# Patient Record
Sex: Female | Born: 1985
Health system: Southern US, Community
[De-identification: ages and names within clinical notes are randomized; demographics above are authoritative.]

## PROBLEM LIST (undated history)

## (undated) ENCOUNTER — Inpatient Hospital Stay (HOSPITAL_COMMUNITY): Payer: Self-pay

## (undated) ENCOUNTER — Inpatient Hospital Stay (HOSPITAL_COMMUNITY): Payer: Medicaid Other

## (undated) DIAGNOSIS — I499 Cardiac arrhythmia, unspecified: Secondary | ICD-10-CM

## (undated) DIAGNOSIS — R51 Headache: Secondary | ICD-10-CM

## (undated) DIAGNOSIS — O3410 Maternal care for benign tumor of corpus uteri, unspecified trimester: Secondary | ICD-10-CM

## (undated) DIAGNOSIS — K649 Unspecified hemorrhoids: Secondary | ICD-10-CM

## (undated) DIAGNOSIS — A549 Gonococcal infection, unspecified: Secondary | ICD-10-CM

## (undated) DIAGNOSIS — Z8669 Personal history of other diseases of the nervous system and sense organs: Secondary | ICD-10-CM

## (undated) DIAGNOSIS — D259 Leiomyoma of uterus, unspecified: Secondary | ICD-10-CM

## (undated) DIAGNOSIS — D649 Anemia, unspecified: Secondary | ICD-10-CM

## (undated) DIAGNOSIS — A749 Chlamydial infection, unspecified: Secondary | ICD-10-CM

## (undated) HISTORY — DX: Unspecified hemorrhoids: K64.9

## (undated) HISTORY — DX: Anemia, unspecified: D64.9

## (undated) HISTORY — DX: Leiomyoma of uterus, unspecified: D25.9

## (undated) HISTORY — DX: Leiomyoma of uterus, unspecified: O34.10

## (undated) HISTORY — PX: WISDOM TOOTH EXTRACTION: SHX21

---
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
## 2007-02-16 ENCOUNTER — Ambulatory Visit: Payer: Self-pay | Admitting: Internal Medicine

## 2007-02-17 ENCOUNTER — Encounter: Payer: Self-pay | Admitting: Nurse Practitioner

## 2007-02-18 ENCOUNTER — Ambulatory Visit: Payer: Self-pay | Admitting: Internal Medicine

## 2007-02-18 DIAGNOSIS — R51 Headache: Secondary | ICD-10-CM

## 2007-02-18 DIAGNOSIS — R519 Headache, unspecified: Secondary | ICD-10-CM | POA: Insufficient documentation

## 2007-09-24 ENCOUNTER — Ambulatory Visit (HOSPITAL_COMMUNITY): Admission: RE | Admit: 2007-09-24 | Discharge: 2007-09-24 | Payer: Self-pay | Admitting: Family Medicine

## 2007-12-03 ENCOUNTER — Ambulatory Visit (HOSPITAL_COMMUNITY): Admission: RE | Admit: 2007-12-03 | Discharge: 2007-12-03 | Payer: Self-pay | Admitting: Obstetrics & Gynecology

## 2008-01-03 ENCOUNTER — Emergency Department (HOSPITAL_COMMUNITY): Admission: EM | Admit: 2008-01-03 | Discharge: 2008-01-03 | Payer: Self-pay | Admitting: Family Medicine

## 2008-01-11 ENCOUNTER — Ambulatory Visit: Payer: Self-pay | Admitting: Obstetrics and Gynecology

## 2008-01-11 ENCOUNTER — Inpatient Hospital Stay (HOSPITAL_COMMUNITY): Admission: AD | Admit: 2008-01-11 | Discharge: 2008-01-11 | Payer: Self-pay | Admitting: Family Medicine

## 2008-01-13 ENCOUNTER — Inpatient Hospital Stay (HOSPITAL_COMMUNITY): Admission: RE | Admit: 2008-01-13 | Discharge: 2008-01-13 | Payer: Self-pay | Admitting: Gynecology

## 2008-01-13 ENCOUNTER — Ambulatory Visit: Payer: Self-pay | Admitting: Family Medicine

## 2008-01-14 ENCOUNTER — Inpatient Hospital Stay (HOSPITAL_COMMUNITY): Admission: AD | Admit: 2008-01-14 | Discharge: 2008-01-16 | Payer: Self-pay | Admitting: Obstetrics & Gynecology

## 2008-01-14 ENCOUNTER — Ambulatory Visit: Payer: Self-pay | Admitting: Obstetrics and Gynecology

## 2009-02-11 ENCOUNTER — Emergency Department (HOSPITAL_COMMUNITY): Admission: EM | Admit: 2009-02-11 | Discharge: 2009-02-11 | Payer: Self-pay | Admitting: Emergency Medicine

## 2009-05-29 ENCOUNTER — Emergency Department (HOSPITAL_COMMUNITY): Admission: EM | Admit: 2009-05-29 | Discharge: 2009-05-29 | Payer: Self-pay | Admitting: Family Medicine

## 2009-08-28 ENCOUNTER — Emergency Department (HOSPITAL_COMMUNITY): Admission: EM | Admit: 2009-08-28 | Discharge: 2009-08-28 | Payer: Self-pay | Admitting: Family Medicine

## 2009-09-13 ENCOUNTER — Emergency Department (HOSPITAL_COMMUNITY): Admission: EM | Admit: 2009-09-13 | Discharge: 2009-09-13 | Payer: Self-pay | Admitting: Emergency Medicine

## 2009-09-20 ENCOUNTER — Emergency Department (HOSPITAL_COMMUNITY): Admission: EM | Admit: 2009-09-20 | Discharge: 2009-09-20 | Payer: Self-pay | Admitting: Family Medicine

## 2009-09-21 ENCOUNTER — Encounter: Admission: RE | Admit: 2009-09-21 | Discharge: 2009-09-21 | Payer: Self-pay | Admitting: Family Medicine

## 2009-12-28 ENCOUNTER — Emergency Department (HOSPITAL_COMMUNITY): Admission: EM | Admit: 2009-12-28 | Discharge: 2009-12-28 | Payer: Self-pay | Admitting: Family Medicine

## 2010-02-16 ENCOUNTER — Ambulatory Visit (HOSPITAL_COMMUNITY): Admission: RE | Admit: 2010-02-16 | Discharge: 2010-02-16 | Payer: Self-pay | Admitting: Family Medicine

## 2010-03-23 ENCOUNTER — Inpatient Hospital Stay (HOSPITAL_COMMUNITY)
Admission: AD | Admit: 2010-03-23 | Discharge: 2010-03-23 | Payer: Self-pay | Source: Home / Self Care | Admitting: Obstetrics & Gynecology

## 2010-03-23 ENCOUNTER — Ambulatory Visit: Payer: Self-pay | Admitting: Obstetrics & Gynecology

## 2010-04-13 ENCOUNTER — Ambulatory Visit (HOSPITAL_COMMUNITY): Admission: RE | Admit: 2010-04-13 | Discharge: 2010-04-13 | Payer: Self-pay | Admitting: Obstetrics & Gynecology

## 2010-06-12 ENCOUNTER — Inpatient Hospital Stay (HOSPITAL_COMMUNITY)
Admission: AD | Admit: 2010-06-12 | Discharge: 2010-06-12 | Payer: Self-pay | Source: Home / Self Care | Attending: Obstetrics and Gynecology | Admitting: Obstetrics and Gynecology

## 2010-07-22 ENCOUNTER — Encounter: Payer: Self-pay | Admitting: Family Medicine

## 2010-07-23 ENCOUNTER — Inpatient Hospital Stay (HOSPITAL_COMMUNITY)
Admission: AD | Admit: 2010-07-23 | Discharge: 2010-07-23 | Payer: Self-pay | Source: Home / Self Care | Attending: Family Medicine | Admitting: Family Medicine

## 2010-07-24 LAB — URINALYSIS, ROUTINE W REFLEX MICROSCOPIC
Bilirubin Urine: NEGATIVE
Hgb urine dipstick: NEGATIVE
Ketones, ur: 15 mg/dL — AB
Urine Glucose, Fasting: NEGATIVE mg/dL
pH: 7 (ref 5.0–8.0)

## 2010-08-06 ENCOUNTER — Inpatient Hospital Stay (HOSPITAL_COMMUNITY)
Admission: AD | Admit: 2010-08-06 | Discharge: 2010-08-06 | Disposition: A | Discharge status: Home / Self Care | Payer: Medicaid Other | Source: Ambulatory Visit | Attending: Obstetrics & Gynecology | Admitting: Obstetrics & Gynecology

## 2010-08-06 DIAGNOSIS — O99891 Other specified diseases and conditions complicating pregnancy: Secondary | ICD-10-CM | POA: Insufficient documentation

## 2010-08-06 DIAGNOSIS — R5381 Other malaise: Secondary | ICD-10-CM | POA: Insufficient documentation

## 2010-08-06 LAB — URINE MICROSCOPIC-ADD ON

## 2010-08-06 LAB — URINALYSIS, ROUTINE W REFLEX MICROSCOPIC
Specific Gravity, Urine: >1.03 — ABNORMAL HIGH (ref 1.005–1.030)
Urine Glucose, Fasting: NEGATIVE mg/dL
pH: 6 (ref 5.0–8.0)

## 2010-08-17 ENCOUNTER — Inpatient Hospital Stay (HOSPITAL_COMMUNITY)
Admission: AD | Admit: 2010-08-17 | Discharge: 2010-08-17 | Disposition: A | Discharge status: Home / Self Care | Payer: Medicaid Other | Source: Ambulatory Visit | Attending: Obstetrics & Gynecology | Admitting: Obstetrics & Gynecology

## 2010-08-17 DIAGNOSIS — O99891 Other specified diseases and conditions complicating pregnancy: Secondary | ICD-10-CM | POA: Insufficient documentation

## 2010-08-17 DIAGNOSIS — O9989 Other specified diseases and conditions complicating pregnancy, childbirth and the puerperium: Secondary | ICD-10-CM

## 2010-08-17 DIAGNOSIS — R109 Unspecified abdominal pain: Secondary | ICD-10-CM

## 2010-08-17 DIAGNOSIS — K5289 Other specified noninfective gastroenteritis and colitis: Secondary | ICD-10-CM | POA: Insufficient documentation

## 2010-08-17 LAB — URINALYSIS, ROUTINE W REFLEX MICROSCOPIC
Bilirubin Urine: NEGATIVE
Hgb urine dipstick: NEGATIVE
Ketones, ur: NEGATIVE mg/dL
Nitrite: NEGATIVE
Urobilinogen, UA: 0.2 mg/dL (ref 0.0–1.0)

## 2010-08-17 LAB — URINE MICROSCOPIC-ADD ON

## 2010-08-28 ENCOUNTER — Inpatient Hospital Stay (HOSPITAL_COMMUNITY)
Admission: AD | Admit: 2010-08-28 | Discharge: 2010-08-30 | DRG: 775 | Disposition: A | Discharge status: Home / Self Care | Payer: Medicaid Other | Source: Ambulatory Visit | Attending: Obstetrics & Gynecology | Admitting: Obstetrics & Gynecology

## 2010-08-28 DIAGNOSIS — Z22330 Carrier of Group B streptococcus: Secondary | ICD-10-CM

## 2010-08-28 DIAGNOSIS — O99892 Other specified diseases and conditions complicating childbirth: Principal | ICD-10-CM | POA: Diagnosis present

## 2010-08-28 DIAGNOSIS — O9989 Other specified diseases and conditions complicating pregnancy, childbirth and the puerperium: Secondary | ICD-10-CM

## 2010-08-28 LAB — CBC
Hemoglobin: 10.7 g/dL — ABNORMAL LOW (ref 12.0–15.0)
MCH: 28.1 pg (ref 26.0–34.0)
MCV: 85.8 fL (ref 78.0–100.0)
RBC: 3.81 MIL/uL — ABNORMAL LOW (ref 3.87–5.11)
WBC: 8.4 10*3/uL (ref 4.0–10.5)

## 2010-08-28 LAB — RPR: RPR Ser Ql: NONREACTIVE

## 2010-09-11 LAB — URINALYSIS, ROUTINE W REFLEX MICROSCOPIC
Bilirubin Urine: NEGATIVE
Nitrite: NEGATIVE
Specific Gravity, Urine: 1.025 (ref 1.005–1.030)
Urobilinogen, UA: 1 mg/dL (ref 0.0–1.0)
pH: 6.5 (ref 5.0–8.0)

## 2010-09-11 LAB — FETAL FIBRONECTIN: Fetal Fibronectin: NEGATIVE

## 2010-09-13 LAB — URINALYSIS, ROUTINE W REFLEX MICROSCOPIC
Glucose, UA: NEGATIVE mg/dL
Ketones, ur: NEGATIVE mg/dL
Nitrite: NEGATIVE
Protein, ur: NEGATIVE mg/dL
Urobilinogen, UA: 0.2 mg/dL (ref 0.0–1.0)

## 2010-09-13 LAB — CBC
HCT: 29.5 % — ABNORMAL LOW (ref 36.0–46.0)
Platelets: 268 10*3/uL (ref 150–400)
RBC: 3.3 MIL/uL — ABNORMAL LOW (ref 3.87–5.11)
RDW: 13.7 % (ref 11.5–15.5)
WBC: 7.5 10*3/uL (ref 4.0–10.5)

## 2010-09-13 LAB — URINE MICROSCOPIC-ADD ON

## 2010-09-16 LAB — POCT PREGNANCY, URINE: Preg Test, Ur: POSITIVE

## 2010-09-23 LAB — POCT PREGNANCY, URINE
Preg Test, Ur: NEGATIVE
Preg Test, Ur: NEGATIVE

## 2010-09-23 LAB — POCT URINALYSIS DIP (DEVICE)
Protein, ur: NEGATIVE mg/dL
Urobilinogen, UA: 1 mg/dL (ref 0.0–1.0)

## 2010-09-23 LAB — WET PREP, GENITAL

## 2010-10-03 LAB — STREP A DNA PROBE: Group A Strep Probe: NEGATIVE

## 2010-10-03 LAB — POCT RAPID STREP A (OFFICE): Streptococcus, Group A Screen (Direct): NEGATIVE

## 2010-10-06 LAB — POCT URINALYSIS DIP (DEVICE)
Nitrite: NEGATIVE
Protein, ur: NEGATIVE mg/dL
pH: 7.5 (ref 5.0–8.0)

## 2010-11-13 NOTE — Discharge Summary (Signed)
Sonia Allen, Sonia Allen                ACCOUNT NO.:  [id number]   MEDICAL RECORD NO.:  [id number]          PATIENT TYPE:  INP   LOCATION:  9114                          FACILITY:  [facility]   PHYSICIAN:  Odie Sera, DO      DATE OF BIRTH:  1986-02-26   DATE OF ADMISSION:  01/14/2008  DATE OF DISCHARGE:  01/16/2008                               DISCHARGE SUMMARY   REASON FOR HOSPITALIZATION:  [name] is a 25 year old gravida 1, para  1-0-0-1, who presented at 40 weeks and 4/7th days in active labor.  She  was admitted to Labor and Delivery for management of labor.  Her labor  progressed normally and as her cervical dilation progressed to complete  upon beginning pushing efforts, her baby sustained a prolonged  bradycardia down to the 60s, that lasted approximately minutes.  Because  of this bradycardia, a vacuum-assisted vaginal delivery was performed,  which successfully delivered a viable female infant with a spontaneously  cry.  She had an intact perineum and a first degree left  labial/periurethral tear, which was repaired in the usual manner.  Her  postpartum course was uneventful.  She did well and was discharged from  the hospital from the hospital on postpartum day 2.   PERTINENT LABORATORY DATA:  She was GBS negative.   FINAL DIAGNOSES:  1. Status post vacuum-assisted vaginal delivery.  2. Pregnancy.   SIGNIFICANT FINDINGS:  None.   PROCEDURE PERFORMED:  Vacuum-assisted vaginal delivery, performed  secondary to fetal bradycardia.   CONDITION ON DISCHARGE:  The patient is in good condition and was  discharged to home with the usual postpartum instructions and  recommendations.  She was given instructions to continue taking her  prenatal vitamins as well as Loestrin Fe for birth control.  It was also  recommended that she is to have vaginal rest for 6 weeks and followup  with the healthcare department in 6 weeks for a postpartum check.      Odie Sera, DO  Electronically Signed     MC/MEDQ  D:  01/16/2008  T:  01/17/2008  Job:  [id number]

## 2010-11-13 NOTE — Op Note (Signed)
Sonia Allen, Sonia Allen                ACCOUNT NO.:  [id number]   MEDICAL RECORD NO.:  [id number]          PATIENT TYPE:  INP   LOCATION:  9114                          FACILITY:  [facility]   PHYSICIAN:  Ginger Carne, MD  DATE OF BIRTH:  1986-06-27   DATE OF PROCEDURE:  DATE OF DISCHARGE:                               OPERATIVE REPORT   PREOPERATIVE DIAGNOSES:  1. Term intrauterine pregnancy.  2. Fetal bradycardia.   POSTOPERATIVE DIAGNOSES:  1. Term intrauterine pregnancy.  2. Fetal bradycardia.   PROCEDURE:  Vaginal-assisted vacuum delivery.   SURGEON:  Odie Sera, DO   ASSISTANT:  Ginger Carne, MD   DESCRIPTION OF FINDINGS:  1. Viable female infant.  2. Placenta, intact   ESTIMATED BLOOD LOSS:  300 mL.   DESCRIPTION OF PROCEDURE:  The patient progressed to complete-complete,  and on beginning of pushing efforts, the baby sustained a bradycardia  into the 60s that persisted approximately 12 minutes in duration.  Due  to the persistent bradycardia, we decided to perform a vacuum-assisted  vaginal delivery.  Adequate anesthesia was confirmed.  Anesthesia was  via epidural.  The vacuum was placed in appropriate position and  confirmed that no maternal tissue was involved.  The vacuum was placed  at appropriate pressure, and with the mother's pushing efforts, the  vacuum was used to assist in delivery over 5 series of pushes.  There  were 2 pop-offs.  The head was delivered occiput anterior and baby was  bulb suctioned on the perineum.  The anterior shoulder and the rest of  the body was delivered in the usual manner.  There was no nuchal cord.  The placenta delivered spontaneously.  There was a three-vessel-cord.  The placenta was intact.  The perineum was intact.  There was a left  labial/periurethral tear, which was repaired with 3-0Vicryl suture.  The  female baby's Apgars were 6 and 8, and the weight was 6 pounds and 2  ounces.  Mother and baby are in good condition  upon completion of this  procedure.   SPECIMENS REMOVED:  1. Viable female infant.  2. Placenta.      Odie Sera, DO  Electronically Signed     ______________________________  Ginger Carne, MD    MC/MEDQ  D:  01/14/2008  T:  01/15/2008  Job:  [id number]

## 2011-03-29 LAB — CBC
Hemoglobin: 12.6
MCHC: 33.1
RDW: 15.1

## 2011-04-18 ENCOUNTER — Emergency Department (HOSPITAL_BASED_OUTPATIENT_CLINIC_OR_DEPARTMENT_OTHER)
Admission: EM | Admit: 2011-04-18 | Discharge: 2011-04-18 | Disposition: A | Discharge status: Home / Self Care | Payer: Worker's Compensation | Attending: Emergency Medicine | Admitting: Emergency Medicine

## 2011-04-18 ENCOUNTER — Encounter: Payer: Self-pay | Admitting: Family Medicine

## 2011-04-18 DIAGNOSIS — W268XXA Contact with other sharp object(s), not elsewhere classified, initial encounter: Secondary | ICD-10-CM | POA: Insufficient documentation

## 2011-04-18 DIAGNOSIS — S61209A Unspecified open wound of unspecified finger without damage to nail, initial encounter: Secondary | ICD-10-CM | POA: Insufficient documentation

## 2011-04-18 DIAGNOSIS — IMO0002 Reserved for concepts with insufficient information to code with codable children: Secondary | ICD-10-CM

## 2011-04-18 DIAGNOSIS — Y9289 Other specified places as the place of occurrence of the external cause: Secondary | ICD-10-CM | POA: Insufficient documentation

## 2011-04-18 NOTE — ED Notes (Signed)
Pt sts she cut her left thumb on a cardboard box at work. Pt has band aid to thumb, no signs of bleeding at present.

## 2011-04-18 NOTE — ED Provider Notes (Signed)
History     CSN: [id number] Arrival date & time: 04/18/2011  5:34 PM   First MD Initiated Contact with Patient 04/18/11 1746      Chief Complaint  Patient presents with  . Laceration    (Consider location/radiation/quality/duration/timing/severity/associated sxs/prior treatment) HPI Patient cut her left thumb on a cardboard box while at work. Patient applied a Band-Aid. There has been no bleeding. Patient was sent from her job to make sure she did not need any stitches. The patient denies any numbness or weakness. The pain increases slightly with palpation. It is sharp in nature. History reviewed. No pertinent past medical history.  History reviewed. No pertinent past surgical history.  No family history on file.  History  Substance Use Topics  . Smoking status: Never Smoker   . Smokeless tobacco: Not on file  . Alcohol Use: No    OB History    Grav Para Term Preterm Abortions TAB SAB Ect Mult Living                  Review of Systems  Constitutional: Negative for fever.  Respiratory: Negative for cough.   Neurological: Negative for numbness.    Allergies  Review of patient's allergies indicates no known allergies.  Home Medications   Current Outpatient Rx  Name Route Sig Dispense Refill  . IBUPROFEN 200 MG PO TABS Oral Take 200 mg by mouth every 6 (six) hours as needed. For pain       BP 114/64  Pulse 70  Temp(Src) 97.8 F (36.6 C) (Oral)  Resp 16  Ht 5\' 4"  (1.626 m)  Wt 150 lb (68.04 kg)  BMI 25.75 kg/m2  SpO2 100%  LMP 04/16/2011  Physical Exam  Nursing note and vitals reviewed. Constitutional: She appears well-developed and well-nourished. No distress.  HENT:  Head: Normocephalic and atraumatic.  Right Ear: External ear normal.  Left Ear: External ear normal.  Eyes: Conjunctivae are normal. Right eye exhibits no discharge. Left eye exhibits no discharge. No scleral icterus.  Neck: Neck supple. No tracheal deviation present.  Pulmonary/Chest:  Effort normal. No stridor. No respiratory distress.  Musculoskeletal: She exhibits no edema.       Small approximately 3-4 mm superficial laceration of the pad of her left thumb, does not penetrate through the dermis, well approximated, distal neurovascular intact  Neurological: She is alert. Cranial nerve deficit: no gross deficits.  Skin: Skin is warm and dry. No rash noted.  Psychiatric: She has a normal mood and affect.    ED Course  Procedures (including critical care time) And was irrigated and a Band-Aid was applied by nursing staff Labs Reviewed - No data to display No results found.   Diagnosis 1:  laceration    MDM  The patient is a minor laceration. A Band-Aid was applied by nursing staff       Celene Kras, MD 04/18/11 [phone]

## 2011-05-26 ENCOUNTER — Emergency Department (HOSPITAL_COMMUNITY)
Admission: EM | Admit: 2011-05-26 | Discharge: 2011-05-27 | Disposition: A | Discharge status: Home / Self Care | Payer: Self-pay | Attending: Emergency Medicine | Admitting: Emergency Medicine

## 2011-05-26 DIAGNOSIS — D259 Leiomyoma of uterus, unspecified: Secondary | ICD-10-CM | POA: Insufficient documentation

## 2011-05-26 DIAGNOSIS — R109 Unspecified abdominal pain: Secondary | ICD-10-CM | POA: Insufficient documentation

## 2011-05-27 ENCOUNTER — Emergency Department (HOSPITAL_COMMUNITY): Payer: Self-pay

## 2011-05-27 ENCOUNTER — Other Ambulatory Visit: Payer: Self-pay

## 2011-05-27 ENCOUNTER — Encounter (HOSPITAL_COMMUNITY): Payer: Self-pay | Admitting: *Deleted

## 2011-05-27 LAB — CBC
Hemoglobin: 10.2 g/dL — ABNORMAL LOW (ref 12.0–15.0)
MCH: 28.3 pg (ref 26.0–34.0)
RBC: 3.6 MIL/uL — ABNORMAL LOW (ref 3.87–5.11)

## 2011-05-27 LAB — BASIC METABOLIC PANEL
BUN: 16 mg/dL (ref 6–23)
CO2: 27 mEq/L (ref 19–32)
Chloride: 104 mEq/L (ref 96–112)
GFR calc Af Amer: >90 mL/min (ref >90)
GFR calc non Af Amer: >90 mL/min (ref >90)
Glucose, Bld: 99 mg/dL (ref 70–99)
Potassium: 5.6 mEq/L — ABNORMAL HIGH (ref 3.5–5.1)
Potassium: 3.6 mEq/L (ref 3.5–5.1)
Sodium: 139 mEq/L (ref 135–145)

## 2011-05-27 LAB — DIFFERENTIAL
Eosinophils Absolute: 0.1 10*3/uL (ref 0.0–0.7)
Lymphs Abs: 2.9 10*3/uL (ref 0.7–4.0)
Monocytes Relative: 5 % (ref 3–12)
Neutro Abs: 4.2 10*3/uL (ref 1.7–7.7)
Neutrophils Relative %: 55 % (ref 43–77)

## 2011-05-27 LAB — URINALYSIS, ROUTINE W REFLEX MICROSCOPIC
Bilirubin Urine: NEGATIVE
Leukocytes, UA: NEGATIVE
Nitrite: NEGATIVE
Specific Gravity, Urine: 1.028 (ref 1.005–1.030)
pH: 6.5 (ref 5.0–8.0)

## 2011-05-27 LAB — WET PREP, GENITAL

## 2011-05-27 MED ORDER — MORPHINE SULFATE 4 MG/ML IJ SOLN
4.0000 mg | Freq: Once | INTRAMUSCULAR | Status: AC
  Administered 2011-05-27: 4 mg via INTRAVENOUS
  Filled 2011-05-27: qty 1

## 2011-05-27 MED ORDER — HYDROCODONE-ACETAMINOPHEN 5-325 MG PO TABS: 1.0000 | ORAL_TABLET | ORAL | Status: AC | PRN

## 2011-05-27 MED ORDER — ONDANSETRON HCL 4 MG/2ML IJ SOLN
4.0000 mg | Freq: Once | INTRAMUSCULAR | Status: AC
  Administered 2011-05-27: 4 mg via INTRAVENOUS
  Filled 2011-05-27: qty 2

## 2011-05-27 NOTE — ED Notes (Signed)
Patient stable upon discharge. Patient discharged home with friend.  

## 2011-05-27 NOTE — ED Notes (Signed)
Pt states that her RLQ began to hurt today when she woke up.  Pt states that her pain radiates to her back.  Pt denies hx of same.  Pt denies dysuria or hematuria or abnormal vaginal discharge.  Pt also expresses pain with walking

## 2011-05-27 NOTE — ED Provider Notes (Signed)
History     CSN: [id number] Arrival date & time: 05/26/2011 11:44 PM   First MD Initiated Contact with Patient 05/27/11 0050      Chief Complaint  Patient presents with  . Abdominal Pain    HPI  History provided by the patient. Patient presents with complaints of right lower abdominal pain that began earlier yesterday afternoon around 11 AM. Patient reports coming off the second shift is asleep at the time. Patient continued to go back to sleep and states that she was ignoring the pain but the pain continued to increase throughout the afternoon. Pain is sharp and radiates some to the right flank. Patient reports taking Tylenol without any improvement of pain. Pain is worse with certain positions and when she laid on her abdomen. Patient denies any fever, chills, sweats. She denies dysuria, urinary frequency, hematuria, diarrhea, constipation, vaginal discharge, vaginal bleeding or vaginal pain. Patient has had some nausea but denies any vomiting. Patient has no similar symptoms previously. Patient has no significant past medical history.  History reviewed. No pertinent past medical history.  History reviewed. No pertinent past surgical history.  History reviewed. No pertinent family history.  History  Substance Use Topics  . Smoking status: Never Smoker   . Smokeless tobacco: Not on file  . Alcohol Use: No    OB History    Grav Para Term Preterm Abortions TAB SAB Ect Mult Living   2 2 2  0 0 0 0 0 0 2      Review of Systems  Constitutional: Negative for fever, chills and diaphoresis.  Respiratory: Negative for cough and shortness of breath.   Cardiovascular: Negative for chest pain.  Gastrointestinal: Positive for nausea and abdominal pain. Negative for vomiting, diarrhea, constipation and rectal pain.  Genitourinary: Negative for dysuria, frequency, hematuria, flank pain, vaginal bleeding, vaginal discharge and vaginal pain.  All other systems reviewed and are  negative.    Allergies  Review of patient's allergies indicates no known allergies.  Home Medications   Current Outpatient Rx  Name Route Sig Dispense Refill  . IBUPROFEN 200 MG PO TABS Oral Take 200 mg by mouth every 6 (six) hours as needed. For pain       BP 96/78  Pulse 71  Temp(Src) 98.8 F (37.1 C) (Oral)  Resp 19  SpO2 100%  Physical Exam  Nursing note and vitals reviewed. Constitutional: She is oriented to person, place, and time. She appears well-developed and well-nourished. No distress.  HENT:  Head: Normocephalic.  Cardiovascular: Normal rate and regular rhythm.   No murmur heard. Pulmonary/Chest: Effort normal and breath sounds normal. She has no wheezes. She has no rales.  Abdominal: Soft. There is no hepatosplenomegaly. There is tenderness in the right lower quadrant. There is no rigidity, no guarding, no CVA tenderness and negative Murphy's sign.  Genitourinary: Vaginal discharge found.       Chaperone was present. Patient states that right adnexal tenderness is similar to the pain she has felt all day long. Patient has no significant cervical motion tenderness. There is a small amount of thick white discharge present.  Neurological: She is alert and oriented to person, place, and time.  Skin: Skin is warm. No rash noted.  Psychiatric: She has a normal mood and affect. Her behavior is normal.    ED Course  Procedures (including critical care time)  Labs Reviewed  WET PREP, GENITAL - Abnormal; Notable for the following:    Clue Cells, Wet Prep MODERATE (*)  WBC, Wet Prep HPF POC FEW (*)    All other components within normal limits  BASIC METABOLIC PANEL - Abnormal; Notable for the following:    Potassium 5.6 (*) MODERATE HEMOLYSIS   All other components within normal limits  URINALYSIS, ROUTINE W REFLEX MICROSCOPIC  POCT PREGNANCY, URINE  GC/CHLAMYDIA PROBE AMP, GENITAL  POCT PREGNANCY, URINE  BASIC METABOLIC PANEL  CBC  DIFFERENTIAL    Results  for orders placed during the hospital encounter of 05/26/11  WET PREP, GENITAL      Component Value Range   Yeast, Wet Prep NONE SEEN  NONE SEEN    Trich, Wet Prep NONE SEEN  NONE SEEN    Clue Cells, Wet Prep MODERATE (*) NONE SEEN    WBC, Wet Prep HPF POC FEW (*) NONE SEEN   BASIC METABOLIC PANEL      Component Value Range   Sodium 136  135 - 145 (mEq/L)   Potassium 5.6 (*) 3.5 - 5.1 (mEq/L)   Chloride 104  96 - 112 (mEq/L)   CO2 24  19 - 32 (mEq/L)   Glucose, Bld 91  70 - 99 (mg/dL)   BUN 16  6 - 23 (mg/dL)   Creatinine, Ser 8.29  0.50 - 1.10 (mg/dL)   Calcium 8.7  8.4 - 56.2 (mg/dL)   GFR calc non Af Amer >90  >90 (mL/min)   GFR calc Af Amer >90  >90 (mL/min)  URINALYSIS, ROUTINE W REFLEX MICROSCOPIC      Component Value Range   Color, Urine YELLOW  YELLOW    Appearance CLEAR  CLEAR    Specific Gravity, Urine 1.028  1.005 - 1.030    pH 6.5  5.0 - 8.0    Glucose, UA NEGATIVE  NEGATIVE (mg/dL)   Hgb urine dipstick NEGATIVE  NEGATIVE    Bilirubin Urine NEGATIVE  NEGATIVE    Ketones, ur NEGATIVE  NEGATIVE (mg/dL)   Protein, ur NEGATIVE  NEGATIVE (mg/dL)   Urobilinogen, UA 1.0  0.0 - 1.0 (mg/dL)   Nitrite NEGATIVE  NEGATIVE    Leukocytes, UA NEGATIVE  NEGATIVE   POCT PREGNANCY, URINE      Component Value Range   Preg Test, Ur NEGATIVE       US Transvaginal Non-ob  05/27/2011  *RADIOLOGY REPORT*  Clinical Data:  Right pelvic pain for 1 day.  TRANSABDOMINAL AND TRANSVAGINAL ULTRASOUND OF PELVIS DOPPLER ULTRASOUND OF OVARIES  Technique:  Both transabdominal and transvaginal ultrasound examinations of the pelvis were performed. Transabdominal technique was performed for global imaging of the pelvis including uterus, ovaries, adnexal regions, and pelvic cul-de-sac.  It was necessary to proceed with endovaginal exam following the transabdominal exam to visualize the ovaries in greater detail.  Color and duplex Doppler ultrasound was utilized to evaluate blood flow to the ovaries.   Comparison:  Prior ultrasound of pregnancy performed 04/13/2010  Findings:  Uterus:  Enlarged, reflecting a large anterior fibroid; measures 11.0 x 5.9 x 6.4 cm. There is a 4.7 x 4.4 x 4.3 cm fibroid noted along the right anterior aspect of the uterus, demonstrating a calcified rim.  Endometrium:  Normal in thickness and appearance; measures 0.9 cm in thickness.  Right ovary: Normal appearance/no adnexal mass; measures 4.3 x 2.0 x 2.3 cm.  Left ovary:   Normal appearance/no adnexal mass; measures 3.5 x 2.0 x 1.3 cm.  Pulsed Doppler evaluation demonstrates normal low-resistance arterial and venous waveforms in both ovaries.  A small amount of free fluid is seen within  the pelvic cul-de-sac, likely physiologic in nature.  IMPRESSION:  1.  No sonographic evidence for ovarian torsion. 2.  4.7 cm peripherally calcified fibroid noted along the right anterior aspect of the uterus.  Uterus otherwise unremarkable in appearance.  Original Report Authenticated By: Tonia Ghent, M.D.   US Pelvis Complete  05/27/2011  *RADIOLOGY REPORT*  Clinical Data:  Right pelvic pain for 1 day.  TRANSABDOMINAL AND TRANSVAGINAL ULTRASOUND OF PELVIS DOPPLER ULTRASOUND OF OVARIES  Technique:  Both transabdominal and transvaginal ultrasound examinations of the pelvis were performed. Transabdominal technique was performed for global imaging of the pelvis including uterus, ovaries, adnexal regions, and pelvic cul-de-sac.  It was necessary to proceed with endovaginal exam following the transabdominal exam to visualize the ovaries in greater detail.  Color and duplex Doppler ultrasound was utilized to evaluate blood flow to the ovaries.  Comparison:  Prior ultrasound of pregnancy performed 04/13/2010  Findings:  Uterus:  Enlarged, reflecting a large anterior fibroid; measures 11.0 x 5.9 x 6.4 cm. There is a 4.7 x 4.4 x 4.3 cm fibroid noted along the right anterior aspect of the uterus, demonstrating a calcified rim.  Endometrium:  Normal in  thickness and appearance; measures 0.9 cm in thickness.  Right ovary: Normal appearance/no adnexal mass; measures 4.3 x 2.0 x 2.3 cm.  Left ovary:   Normal appearance/no adnexal mass; measures 3.5 x 2.0 x 1.3 cm.  Pulsed Doppler evaluation demonstrates normal low-resistance arterial and venous waveforms in both ovaries.  A small amount of free fluid is seen within the pelvic cul-de-sac, likely physiologic in nature.  IMPRESSION:  1.  No sonographic evidence for ovarian torsion. 2.  4.7 cm peripherally calcified fibroid noted along the right anterior aspect of the uterus.  Uterus otherwise unremarkable in appearance.  Original Report Authenticated By: Tonia Ghent, M.D.   [place] Art/ven Flow Abd Pelv Doppler  05/27/2011  *RADIOLOGY REPORT*  Clinical Data:  Right pelvic pain for 1 day.  TRANSABDOMINAL AND TRANSVAGINAL ULTRASOUND OF PELVIS DOPPLER ULTRASOUND OF OVARIES  Technique:  Both transabdominal and transvaginal ultrasound examinations of the pelvis were performed. Transabdominal technique was performed for global imaging of the pelvis including uterus, ovaries, adnexal regions, and pelvic cul-de-sac.  It was necessary to proceed with endovaginal exam following the transabdominal exam to visualize the ovaries in greater detail.  Color and duplex Doppler ultrasound was utilized to evaluate blood flow to the ovaries.  Comparison:  Prior ultrasound of pregnancy performed 04/13/2010  Findings:  Uterus:  Enlarged, reflecting a large anterior fibroid; measures 11.0 x 5.9 x 6.4 cm. There is a 4.7 x 4.4 x 4.3 cm fibroid noted along the right anterior aspect of the uterus, demonstrating a calcified rim.  Endometrium:  Normal in thickness and appearance; measures 0.9 cm in thickness.  Right ovary: Normal appearance/no adnexal mass; measures 4.3 x 2.0 x 2.3 cm.  Left ovary:   Normal appearance/no adnexal mass; measures 3.5 x 2.0 x 1.3 cm.  Pulsed Doppler evaluation demonstrates normal low-resistance arterial and venous  waveforms in both ovaries.  A small amount of free fluid is seen within the pelvic cul-de-sac, likely physiologic in nature.  IMPRESSION:  1.  No sonographic evidence for ovarian torsion. 2.  4.7 cm peripherally calcified fibroid noted along the right anterior aspect of the uterus.  Uterus otherwise unremarkable in appearance.  Original Report Authenticated By: Tonia Ghent, M.D.     No diagnosis found.    MDM  1:00 AM patient seen and evaluated. Patient in no  acute distress.  3:00AM vision feeling much better after pain medication. She is resting comfortably at this time. Abdomen continues to be soft with no peritoneal signs. WBC is normal.   6 clock a.m. pelvic ultrasound demonstrates fibroid of the uterus. There is no signs for ovarian torsion or cyst. Patient continues to have soft abdomen and is pain-free, at this time no clinical concern for acute abdomen. Will have patient return home.   Angus Seller, [place] 05/27/11 [phone]

## 2011-05-27 NOTE — ED Notes (Signed)
[id number]<GN> Expected date:<BR> Expected time:<BR> Means of arrival:<BR> Comments:<BR> Closed

## 2011-05-27 NOTE — ED Provider Notes (Signed)
Medical screening examination/treatment/procedure(s) were performed by non-physician practitioner and as supervising physician I was immediately available for consultation/collaboration.  Nicholes Stairs, MD 05/27/11 2256

## 2011-05-28 LAB — GC/CHLAMYDIA PROBE AMP, GENITAL: GC Probe Amp, Genital: NEGATIVE

## 2011-08-11 ENCOUNTER — Inpatient Hospital Stay (HOSPITAL_COMMUNITY)
Admission: AD | Admit: 2011-08-11 | Discharge: 2011-08-11 | Disposition: A | Discharge status: Home / Self Care | Payer: Self-pay | Source: Ambulatory Visit | Attending: Obstetrics and Gynecology | Admitting: Obstetrics and Gynecology

## 2011-08-11 ENCOUNTER — Inpatient Hospital Stay (HOSPITAL_COMMUNITY): Payer: Self-pay

## 2011-08-11 ENCOUNTER — Encounter (HOSPITAL_COMMUNITY): Payer: Self-pay | Admitting: *Deleted

## 2011-08-11 DIAGNOSIS — R109 Unspecified abdominal pain: Secondary | ICD-10-CM | POA: Insufficient documentation

## 2011-08-11 DIAGNOSIS — B9689 Other specified bacterial agents as the cause of diseases classified elsewhere: Secondary | ICD-10-CM | POA: Insufficient documentation

## 2011-08-11 DIAGNOSIS — Z331 Pregnant state, incidental: Secondary | ICD-10-CM

## 2011-08-11 DIAGNOSIS — N760 Acute vaginitis: Secondary | ICD-10-CM | POA: Insufficient documentation

## 2011-08-11 DIAGNOSIS — A499 Bacterial infection, unspecified: Secondary | ICD-10-CM | POA: Insufficient documentation

## 2011-08-11 DIAGNOSIS — O2390 Unspecified genitourinary tract infection in pregnancy, unspecified trimester: Secondary | ICD-10-CM | POA: Insufficient documentation

## 2011-08-11 HISTORY — DX: Cardiac arrhythmia, unspecified: I49.9

## 2011-08-11 HISTORY — DX: Gonococcal infection, unspecified: A54.9

## 2011-08-11 HISTORY — DX: Chlamydial infection, unspecified: A74.9

## 2011-08-11 LAB — CBC
MCH: 28.2 pg (ref 26.0–34.0)
MCHC: 32.9 g/dL (ref 30.0–36.0)
Platelets: 305 10*3/uL (ref 150–400)
RDW: 14.2 % (ref 11.5–15.5)

## 2011-08-11 LAB — HCG, QUANTITATIVE, PREGNANCY: hCG, Beta Chain, Quant, S: 18355 m[IU]/mL — ABNORMAL HIGH (ref <5)

## 2011-08-11 LAB — WET PREP, GENITAL
Trich, Wet Prep: NONE SEEN
Yeast Wet Prep HPF POC: NONE SEEN

## 2011-08-11 LAB — DIFFERENTIAL
Basophils Relative: 1 % (ref 0–1)
Eosinophils Absolute: 0.1 10*3/uL (ref 0.0–0.7)
Neutrophils Relative %: 47 % (ref 43–77)

## 2011-08-11 LAB — ABO/RH: ABO/RH(D): B POS

## 2011-08-11 MED ORDER — METRONIDAZOLE 500 MG PO TABS: 500.0000 mg | ORAL_TABLET | Freq: Three times a day (TID) | ORAL | Status: AC

## 2011-08-11 NOTE — ED Provider Notes (Signed)
History     CSN: [id number]  Arrival date & time 08/11/11  1543   None     Chief Complaint  Patient presents with  . Dizziness    HPI Sonia Allen is a 26 y.o. female who presents to MAU for lower abdominal cramping. She had a positive home pregnancy test that was positive. She had sex Jan. 11th and took the morning after pill Jan 14th and then had unprotected sex again 3 days later. Currently has 2 sex partners and had sex with one on Jan. 11 and the other partner on Jan. 17. Having vaginal discharge.   Past Medical History  Diagnosis Date  . Gonorrhea   . Chlamydia   . Arrhythmia     Past Surgical History  Procedure Date  . Wisdom tooth extraction     History reviewed. No pertinent family history.  History  Substance Use Topics  . Smoking status: Never Smoker   . Smokeless tobacco: Not on file  . Alcohol Use: Yes     weekly- 1/2 bottle liquor    OB History    Grav Para Term Preterm Abortions TAB SAB Ect Mult Living   3 2 2  0 0 0 0 0 0 2      Review of Systems  Constitutional: Negative for fever, chills, diaphoresis and fatigue.  HENT: Negative for ear pain, congestion, sore throat, facial swelling, neck pain, neck stiffness, dental problem and sinus pressure.   Eyes: Negative for photophobia, pain and discharge.  Respiratory: Negative for cough, chest tightness and wheezing.   Gastrointestinal: Positive for nausea and abdominal pain. Negative for vomiting, diarrhea, constipation and abdominal distention.  Genitourinary: Positive for frequency, vaginal discharge and pelvic pain. Negative for dysuria, flank pain, vaginal bleeding, difficulty urinating and vaginal pain.  Musculoskeletal: Negative for myalgias, back pain and gait problem.  Skin: Negative for color change and rash.  Neurological: Positive for light-headedness. Negative for dizziness, speech difficulty, weakness, numbness and headaches.  Psychiatric/Behavioral: Negative for confusion and agitation.     Allergies  Review of patient's allergies indicates no known allergies.  Home Medications  No current outpatient prescriptions on file.  BP 113/61  Pulse 90  Temp(Src) 99.5 F (37.5 C) (Oral)  Resp 18  Ht 5' 3.75" (1.619 m)  Wt 158 lb 9.6 oz (71.94 kg)  BMI 27.44 kg/m2  LMP 07/09/2011  Physical Exam  Nursing note and vitals reviewed. Constitutional: She is oriented to person, place, and time. She appears well-developed and well-nourished.  HENT:  Head: Normocephalic.  Eyes: EOM are normal.  Neck: Neck supple.  Cardiovascular: Normal rate.   Pulmonary/Chest: Effort normal.  Abdominal: Soft. There is no tenderness.  Genitourinary:       External genitalia without lesions. White discharge vaginal vault. Cervix closed, nabothian cyst noted. No CMT, no adnexal tenderness. Uterus slightly enlarged.  Musculoskeletal: Normal range of motion.  Neurological: She is alert and oriented to person, place, and time. No cranial nerve deficit.  Skin: Skin is warm and dry.  Psychiatric: She has a normal mood and affect. Her behavior is normal. Judgment and thought content normal.   Results for orders placed during the hospital encounter of 08/11/11 (from the past 24 hour(s))  POCT PREGNANCY, URINE     Status: Abnormal   Collection Time   08/11/11  4:12 PM      Component Value Range   Preg Test, Ur POSITIVE (*) NEGATIVE   CBC     Status: Abnormal   Collection  Time   08/11/11  6:19 PM      Component Value Range   WBC 5.1  4.0 - 10.5 (K/uL)   RBC 3.83 (*) 3.87 - 5.11 (MIL/uL)   Hemoglobin 10.8 (*) 12.0 - 15.0 (g/dL)   HCT 21.3 (*) 08.6 - 46.0 (%)   MCV 85.6  78.0 - 100.0 (fL)   MCH 28.2  26.0 - 34.0 (pg)   MCHC 32.9  30.0 - 36.0 (g/dL)   RDW 57.8  46.9 - 62.9 (%)   Platelets 305  150 - 400 (K/uL)  DIFFERENTIAL     Status: Normal   Collection Time   08/11/11  6:19 PM      Component Value Range   Neutrophils Relative 47  43 - 77 (%)   Neutro Abs 2.4  1.7 - 7.7 (K/uL)    Lymphocytes Relative 43  12 - 46 (%)   Lymphs Abs 2.2  0.7 - 4.0 (K/uL)   Monocytes Relative 7  3 - 12 (%)   Monocytes Absolute 0.3  0.1 - 1.0 (K/uL)   Eosinophils Relative 2  0 - 5 (%)   Eosinophils Absolute 0.1  0.0 - 0.7 (K/uL)   Basophils Relative 1  0 - 1 (%)   Basophils Absolute 0.0  0.0 - 0.1 (K/uL)  HCG, QUANTITATIVE, PREGNANCY     Status: Abnormal   Collection Time   08/11/11  6:19 PM      Component Value Range   hCG, Beta Chain, Quant, S 18355 (*) <5 (mIU/mL)  ABO/RH     Status: Normal   Collection Time   08/11/11  6:19 PM      Component Value Range   ABO/RH(D) B POS    WET PREP, GENITAL     Status: Abnormal   Collection Time   08/11/11  6:40 PM      Component Value Range   Yeast Wet Prep HPF POC NONE SEEN  NONE SEEN    Trich, Wet Prep NONE SEEN  NONE SEEN    Clue Cells Wet Prep HPF POC MODERATE (*) NONE SEEN    WBC, Wet Prep HPF POC MANY (*) NONE SEEN   POCT PREGNANCY, URINE     Status: Abnormal   Collection Time   08/11/11  7:23 PM      Component Value Range   Preg Test, Ur POSITIVE (*) NEGATIVE    Assessment: Abdominal pain in early pregnancy   Bacterial vaginosis  Plan:  Labs, ultrasound  ED Course: Care turned over to Wynelle Bourgeois, CNM @ 8:15 pm patient awaiting Ultrasound.  Procedures   MDM          Kerrie Buffalo, NP 08/11/11 2014  US Ob Comp Less 14 Wks  08/11/2011  *RADIOLOGY REPORT*  Clinical Data: Pelvic pain.  Pregnant.  OBSTETRIC <14 WK ULTRASOUND  Technique:  Transabdominal ultrasound was performed for evaluation of the gestation as well as the maternal uterus and adnexal regions.  Comparison:  None  Intrauterine gestational sac: Visualized/normal in shape. Yolk sac: Present Embryo: Present Cardiac Activity: Present Heart Rate: 116 bpm  MSD:  mm  w  d CRL:  3.8 mm  sixw  oned            [place] EDC: 04/04/2012.  Maternal uterus/Adnexae: A small to moderate sized subchorionic hemorrhage is noted. The right ovary is normal.  A small corpus luteum cyst  is noted. The left ovary is normal. No free pelvic fluid collections.  IMPRESSION:  1.  Single living intrauterine embryo estimated at 6 weeks and 1 day gestation. 2.  Small to moderate subchorionic hemorrhage. 3.  Normal ovaries.  Original Report Authenticated By: P. Loralie Champagne, M.D.   Informed patient of above and about BV. Will d/c home to prenatal care and give Rx for Flagyl.

## 2011-08-11 NOTE — Progress Notes (Signed)
Pt feels dizzy and lightheaded  And nauseated x 2 weeks.

## 2011-08-12 LAB — GC/CHLAMYDIA PROBE AMP, GENITAL
Chlamydia, DNA Probe: NEGATIVE
GC Probe Amp, Genital: NEGATIVE

## 2011-08-31 HISTORY — PX: INDUCED ABORTION: SHX677

## 2011-10-23 ENCOUNTER — Encounter (HOSPITAL_COMMUNITY): Payer: Self-pay | Admitting: *Deleted

## 2011-10-23 ENCOUNTER — Inpatient Hospital Stay (HOSPITAL_COMMUNITY)
Admission: AD | Admit: 2011-10-23 | Discharge: 2011-10-23 | Disposition: A | Discharge status: Home / Self Care | Payer: Self-pay | Source: Ambulatory Visit | Attending: Obstetrics and Gynecology | Admitting: Obstetrics and Gynecology

## 2011-10-23 DIAGNOSIS — K649 Unspecified hemorrhoids: Secondary | ICD-10-CM | POA: Insufficient documentation

## 2011-10-23 DIAGNOSIS — K6289 Other specified diseases of anus and rectum: Secondary | ICD-10-CM | POA: Insufficient documentation

## 2011-10-23 LAB — URINALYSIS, ROUTINE W REFLEX MICROSCOPIC
Ketones, ur: NEGATIVE mg/dL
Leukocytes, UA: NEGATIVE
Nitrite: NEGATIVE
Protein, ur: NEGATIVE mg/dL

## 2011-10-23 LAB — URINE MICROSCOPIC-ADD ON

## 2011-10-23 NOTE — MAU Provider Note (Signed)
History     CSN: [id number]  Arrival date and time: 10/23/11 1923   None     Chief Complaint  Patient presents with  . Rectal Pain   HPI 26 y.o. [id number]. Pain with BM since Friday, can feel a "lump", no bleeding. H/O hemorrhoids. Not using anything for discomfort.  LMP 4/18, had TAB on 3/2.     Past Medical History  Diagnosis Date  . Gonorrhea   . Chlamydia   . Arrhythmia     Past Surgical History  Procedure Date  . Wisdom tooth extraction     History reviewed. No pertinent family history.  History  Substance Use Topics  . Smoking status: Never Smoker   . Smokeless tobacco: Never Used  . Alcohol Use: Yes     weekly- 1/2 bottle liquor    Allergies: No Known Allergies  Prescriptions prior to admission  Medication Sig Dispense Refill  . ibuprofen (ADVIL,MOTRIN) 200 MG tablet Take 200 mg by mouth every 6 (six) hours as needed. For pain         Review of Systems  Constitutional: Negative.   Respiratory: Negative.   Cardiovascular: Negative.   Gastrointestinal: Negative for nausea, vomiting, abdominal pain, diarrhea and constipation.  Genitourinary: Negative for dysuria, urgency, frequency, hematuria and flank pain.       Negative for vaginal bleeding, vaginal discharge, dyspareunia  Musculoskeletal: Negative.   Neurological: Negative.   Psychiatric/Behavioral: Negative.    Physical Exam   Blood pressure 119/60, pulse 88, temperature 97.2 F (36.2 C), temperature source Oral, resp. rate 16, height 5' 2.5" (1.588 m), weight 161 lb 3.2 oz (73.12 kg), last menstrual period 10/17/2011, unknown if currently breastfeeding.  Physical Exam  Nursing note and vitals reviewed. Constitutional: She is oriented to person, place, and time. She appears well-developed and well-nourished. No distress.  Cardiovascular: Normal rate.   Respiratory: Effort normal.  GI: Soft. There is no tenderness.  Genitourinary: Rectal exam shows external hemorrhoid (small, not  thrombosed).  Musculoskeletal: Normal range of motion.  Lymphadenopathy:       Right: Inguinal adenopathy present.  Neurological: She is alert and oriented to person, place, and time.  Skin: Skin is warm and dry.  Psychiatric: She has a normal mood and affect.    MAU Course  Procedures Results for orders placed during the hospital encounter of 10/23/11 (from the past 24 hour(s))  URINALYSIS, ROUTINE W REFLEX MICROSCOPIC     Status: Abnormal   Collection Time   10/23/11  7:51 PM      Component Value Range   Color, Urine YELLOW  YELLOW    APPearance CLEAR  CLEAR    Specific Gravity, Urine >1.030 (*) 1.005 - 1.030    pH 5.5  5.0 - 8.0    Glucose, UA NEGATIVE  NEGATIVE (mg/dL)   Hgb urine dipstick SMALL (*) NEGATIVE    Bilirubin Urine NEGATIVE  NEGATIVE    Ketones, ur NEGATIVE  NEGATIVE (mg/dL)   Protein, ur NEGATIVE  NEGATIVE (mg/dL)   Urobilinogen, UA 1.0  0.0 - 1.0 (mg/dL)   Nitrite NEGATIVE  NEGATIVE    Leukocytes, UA NEGATIVE  NEGATIVE   URINE MICROSCOPIC-ADD ON     Status: Abnormal   Collection Time   10/23/11  7:51 PM      Component Value Range   Squamous Epithelial / LPF FEW (*) RARE    WBC, UA 0-2  <3 (WBC/hpf)   RBC / HPF 3-6  <3 (RBC/hpf)   Urine-Other MUCOUS PRESENT  POCT PREGNANCY, URINE     Status: Normal   Collection Time   10/23/11  8:11 PM      Component Value Range   Preg Test, Ur NEGATIVE  NEGATIVE      Assessment and Plan  26 y.o. [id number] with uncomplicated hemorrhoid Rev'd comfort measures, preventative measures, OTC treatments F/U PRN  Nuha Degner 10/23/2011, 8:13 PM

## 2011-10-23 NOTE — MAU Note (Signed)
Pt LMP 10/17/11.  TAB 08/31/2011.  Pt having rectal pain with BM.  Feels a "bump" between her legs that can be moved around.

## 2011-10-24 NOTE — MAU Provider Note (Signed)
Attestation of Attending Supervision of Advanced Practitioner: Evaluation and management procedures were performed by the PA/NP/CNM/OB Fellow under my supervision/collaboration. Chart reviewed and agree with management and plan.  Thaer Miyoshi V 10/24/2011 6:46 AM

## 2011-12-13 ENCOUNTER — Inpatient Hospital Stay (HOSPITAL_COMMUNITY)
Admission: AD | Admit: 2011-12-13 | Discharge: 2011-12-13 | Disposition: A | Discharge status: Home / Self Care | Payer: Self-pay | Source: Ambulatory Visit | Attending: Obstetrics & Gynecology | Admitting: Obstetrics & Gynecology

## 2011-12-13 ENCOUNTER — Encounter (HOSPITAL_COMMUNITY): Payer: Self-pay | Admitting: *Deleted

## 2011-12-13 DIAGNOSIS — N938 Other specified abnormal uterine and vaginal bleeding: Secondary | ICD-10-CM | POA: Insufficient documentation

## 2011-12-13 DIAGNOSIS — N939 Abnormal uterine and vaginal bleeding, unspecified: Secondary | ICD-10-CM

## 2011-12-13 DIAGNOSIS — B9689 Other specified bacterial agents as the cause of diseases classified elsewhere: Secondary | ICD-10-CM | POA: Insufficient documentation

## 2011-12-13 DIAGNOSIS — N949 Unspecified condition associated with female genital organs and menstrual cycle: Secondary | ICD-10-CM | POA: Insufficient documentation

## 2011-12-13 DIAGNOSIS — N760 Acute vaginitis: Secondary | ICD-10-CM | POA: Insufficient documentation

## 2011-12-13 DIAGNOSIS — A499 Bacterial infection, unspecified: Secondary | ICD-10-CM | POA: Insufficient documentation

## 2011-12-13 HISTORY — DX: Headache: R51

## 2011-12-13 HISTORY — DX: Personal history of other diseases of the nervous system and sense organs: Z86.69

## 2011-12-13 LAB — POCT PREGNANCY, URINE: Preg Test, Ur: NEGATIVE

## 2011-12-13 LAB — CBC
MCV: 85.6 fL (ref 78.0–100.0)
Platelets: 380 10*3/uL (ref 150–400)
RBC: 4.09 MIL/uL (ref 3.87–5.11)
WBC: 5.7 10*3/uL (ref 4.0–10.5)

## 2011-12-13 LAB — URINALYSIS, ROUTINE W REFLEX MICROSCOPIC
Glucose, UA: NEGATIVE mg/dL
Leukocytes, UA: NEGATIVE
Specific Gravity, Urine: 1.02 (ref 1.005–1.030)
Urobilinogen, UA: 0.2 mg/dL (ref 0.0–1.0)

## 2011-12-13 LAB — URINE MICROSCOPIC-ADD ON

## 2011-12-13 LAB — WET PREP, GENITAL
Trich, Wet Prep: NONE SEEN
Yeast Wet Prep HPF POC: NONE SEEN

## 2011-12-13 MED ORDER — METRONIDAZOLE 500 MG PO TABS: 500.0000 mg | ORAL_TABLET | Freq: Two times a day (BID) | ORAL | Status: AC

## 2011-12-13 NOTE — MAU Provider Note (Signed)
History     CSN: [id number]  Arrival date and time: 12/13/11 1741   First Provider Initiated Contact with Patient 12/13/11 2036      No chief complaint on file.  HPI  Pt is  Not pregnant with LMP 12/01/2011 lasting 10 days and has been spotting today.  She has not had cramping.  She was changing every one to 2 hours with some clots.  She is not using anything for birth control.  She goes to [facility].  She denies fever, chills or UTI symptoms.  She denies vaginal discharge.    Past Medical History  Diagnosis Date  . Gonorrhea   . Chlamydia   . Arrhythmia   . Hx of migraine headaches   . Headache     Past Surgical History  Procedure Date  . Wisdom tooth extraction   . Induced abortion March  2 ,2013    Family History  Problem Relation Age of Onset  . Other Neg Hx     History  Substance Use Topics  . Smoking status: Never Smoker   . Smokeless tobacco: Never Used  . Alcohol Use: Yes     weekly- 1/2 bottle liquor    Allergies: No Known Allergies  Prescriptions prior to admission  Medication Sig Dispense Refill  . ibuprofen (ADVIL,MOTRIN) 200 MG tablet Take 400 mg by mouth every 6 (six) hours as needed. For pain        Review of Systems  Constitutional: Negative for fever and chills.  Gastrointestinal: Negative for nausea, vomiting, abdominal pain, diarrhea and constipation.  Genitourinary: Negative for dysuria, urgency and frequency.  Neurological: Negative for headaches.   Physical Exam   Blood pressure 121/71, pulse 79, temperature 98.4 F (36.9 C), temperature source Oral, resp. rate 16, height 5' 2.5" (1.588 m), weight 164 lb 6 oz (74.56 kg), last menstrual period 12/01/2011, not currently breastfeeding.  Physical Exam  Constitutional: She is oriented to person, place, and time. She appears well-developed and well-nourished.  HENT:  Head: Normocephalic.  Eyes: Pupils are equal, round, and reactive to light.  Neck: Normal range of motion. Neck  supple.  Cardiovascular: Normal rate.   Respiratory: Effort normal.  GI: Soft. She exhibits no distension. There is no tenderness. There is no rebound and no guarding.  Genitourinary:       Mod amount of opaque pink discharge in vault; cervix clean, NT; uterus NSSC NT; adnexa without palpable enlargement or tenderness  Musculoskeletal: Normal range of motion.  Neurological: She is alert and oriented to person, place, and time.  Skin: Skin is warm and dry.  Psychiatric: She has a normal mood and affect.    MAU Course  Procedures Results for orders placed during the hospital encounter of 12/13/11 (from the past 24 hour(s))  URINALYSIS, ROUTINE W REFLEX MICROSCOPIC     Status: Abnormal   Collection Time   12/13/11  6:52 PM      Component Value Range   Color, Urine YELLOW  YELLOW   APPearance CLEAR  CLEAR   Specific Gravity, Urine 1.020  1.005 - 1.030   pH 7.5  5.0 - 8.0   Glucose, UA NEGATIVE  NEGATIVE mg/dL   Hgb urine dipstick MODERATE (*) NEGATIVE   Bilirubin Urine NEGATIVE  NEGATIVE   Ketones, ur NEGATIVE  NEGATIVE mg/dL   Protein, ur NEGATIVE  NEGATIVE mg/dL   Urobilinogen, UA 0.2  0.0 - 1.0 mg/dL   Nitrite NEGATIVE  NEGATIVE   Leukocytes, UA NEGATIVE  NEGATIVE  URINE MICROSCOPIC-ADD ON     Status: Abnormal   Collection Time   12/13/11  6:52 PM      Component Value Range   Squamous Epithelial / LPF FEW (*) RARE   WBC, UA 0-2  <3 WBC/hpf   RBC / HPF 3-6  <3 RBC/hpf   Bacteria, UA FEW (*) RARE   Urine-Other AMORPHOUS URATES/PHOSPHATES    CBC     Status: Abnormal   Collection Time   12/13/11  8:06 PM      Component Value Range   WBC 5.7  4.0 - 10.5 K/uL   RBC 4.09  3.87 - 5.11 MIL/uL   Hemoglobin 11.6 (*) 12.0 - 15.0 g/dL   HCT 16.1 (*) 09.6 - 04.5 %   MCV 85.6  78.0 - 100.0 fL   MCH 28.4  26.0 - 34.0 pg   MCHC 33.1  30.0 - 36.0 g/dL   RDW 40.9  81.1 - 91.4 %   Platelets 380  150 - 400 K/uL  POCT PREGNANCY, URINE     Status: Normal   Collection Time   12/13/11  8:50  PM      Component Value Range   Preg Test, Ur NEGATIVE  NEGATIVE  WET PREP, GENITAL     Status: Abnormal   Collection Time   12/13/11  9:54 PM      Component Value Range   Yeast Wet Prep HPF POC NONE SEEN  NONE SEEN   Trich, Wet Prep NONE SEEN  NONE SEEN   Clue Cells Wet Prep HPF POC MODERATE (*) NONE SEEN   WBC, Wet Prep HPF POC FEW (*) NONE SEEN  GC/Chlamdyia pending    Assessment and Plan  AUB-f/u with [facility] if persists Bacterial vaginosis- Flagyl 500mg  BID for 7 days  Sonia Allen 12/13/2011, 8:37 PM

## 2011-12-13 NOTE — MAU Note (Signed)
Pt states LMP-12/01/2011, just came off her cycle 2 days ago, now having more . Bleeding, denies cramping. Denies abnormal vaginal d/c changes. Has been dizzy at times.

## 2012-03-10 ENCOUNTER — Encounter (HOSPITAL_COMMUNITY): Payer: Self-pay | Admitting: *Deleted

## 2012-03-10 ENCOUNTER — Emergency Department (INDEPENDENT_AMBULATORY_CARE_PROVIDER_SITE_OTHER)
Admission: EM | Admit: 2012-03-10 | Discharge: 2012-03-10 | Disposition: A | Discharge status: Home / Self Care | Payer: Self-pay | Source: Home / Self Care

## 2012-03-10 DIAGNOSIS — B349 Viral infection, unspecified: Secondary | ICD-10-CM

## 2012-03-10 DIAGNOSIS — J029 Acute pharyngitis, unspecified: Secondary | ICD-10-CM

## 2012-03-10 DIAGNOSIS — B9789 Other viral agents as the cause of diseases classified elsewhere: Secondary | ICD-10-CM

## 2012-03-10 LAB — POCT RAPID STREP A: Streptococcus, Group A Screen (Direct): NEGATIVE

## 2012-03-10 NOTE — ED Provider Notes (Signed)
History     CSN: [id number]  Arrival date & time 03/10/12  1043   None     Chief Complaint  Patient presents with  . Sore Throat    (Consider location/radiation/quality/duration/timing/severity/associated sxs/prior treatment) HPI Comments: For 48hrs, sore throat, earache, headache, bodyaches, weakness and malaise. No chills or documented fever.  She st is 2 months pregnant, No vaginal bleeding or pelvic pain  Patient is a 26 y.o. female presenting with pharyngitis. The history is provided by the patient.  Sore Throat This is a new problem. The current episode started 2 days ago. The problem occurs constantly. The problem has been gradually worsening. Associated symptoms include headaches. Pertinent negatives include no chest pain, no abdominal pain and no shortness of breath. Nothing aggravates the symptoms. Nothing relieves the symptoms. She has tried nothing for the symptoms.    Past Medical History  Diagnosis Date  . Gonorrhea   . Chlamydia   . Arrhythmia   . Hx of migraine headaches   . Headache     Past Surgical History  Procedure Date  . Wisdom tooth extraction   . Induced abortion March  2 ,2013    Family History  Problem Relation Age of Onset  . Other Neg Hx     History  Substance Use Topics  . Smoking status: Never Smoker   . Smokeless tobacco: Never Used  . Alcohol Use: No     weekly- 1/2 bottle liquor    OB History    Grav Para Term Preterm Abortions TAB SAB Ect Mult Living   4 2 2  0 1 1 0 0 0 2      Review of Systems  Constitutional: Positive for activity change and fatigue. Negative for fever, chills, diaphoresis and appetite change.  HENT: Positive for ear pain. Negative for hearing loss, nosebleeds, congestion, facial swelling, rhinorrhea, neck stiffness, postnasal drip, tinnitus and ear discharge.   Eyes: Negative.   Respiratory: Negative for cough, chest tightness, shortness of breath and wheezing.   Cardiovascular: Negative for chest  pain and palpitations.  Gastrointestinal: Negative.  Negative for abdominal pain.  Genitourinary: Negative.   Skin: Negative.   Neurological: Positive for numbness and headaches. Negative for tremors, syncope and speech difficulty.    Allergies  Review of patient's allergies indicates no known allergies.  Home Medications   Current Outpatient Rx  Name Route Sig Dispense Refill  . TYLENOL PO Oral Take by mouth.    . IBUPROFEN 200 MG PO TABS Oral Take 400 mg by mouth every 6 (six) hours as needed. For pain      BP 114/72  Pulse 70  Temp 99.2 F (37.3 C) (Oral)  Resp 16  SpO2 100%  LMP 01/01/2012  Physical Exam  Constitutional: She is oriented to person, place, and time. She appears well-developed and well-nourished. She appears distressed.  HENT:  Right Ear: External ear normal.  Left Ear: External ear normal.       Unable to obtain good visualization as she is unable to relax tongue and obstructs view.  Eyes: Conjunctivae and EOM are normal. Pupils are equal, round, and reactive to light. Right eye exhibits no discharge. Left eye exhibits no discharge.  Neck: Normal range of motion. Neck supple.  Cardiovascular: Normal rate.   Murmur heard.  Systolic murmur is present with a grade of 2/6  Pulmonary/Chest: Effort normal and breath sounds normal. No respiratory distress. She has no rales.  Musculoskeletal: Normal range of motion. She exhibits no edema  and no tenderness.  Lymphadenopathy:    She has no cervical adenopathy.  Neurological: She is alert and oriented to person, place, and time. No cranial nerve deficit.  Skin: Skin is warm and dry. She is not diaphoretic.  Psychiatric: She has a normal mood and affect.    ED Course  Procedures (including critical care time)   Labs Reviewed  POCT RAPID STREP A ([facility] ONLY)   No results found.   1. Viral syndrome   2. Pharyngitis       MDM  Tylenol prn. Plenty of fluids.  Results for orders placed during  the hospital encounter of 03/10/12  POCT RAPID STREP A ([facility] ONLY)      Component Value Range   Streptococcus, Group A Screen (Direct) NEGATIVE  NEGATIVE          Hayden Rasmussen, NP 03/10/12 1348

## 2012-03-10 NOTE — ED Notes (Signed)
Pt   Reports  Symptoms  Of  sorethroat  /  Body  Aches  As  Well  As  Bilateral  Earaches    Symptoms  X    3  Days             Pt  Also  Reports  Symptoms  Of  Weakness  As  Well           -  Pt  Is  sev  Months  preg       -  Not taking  Any  vits

## 2012-03-14 NOTE — ED Provider Notes (Signed)
Medical screening examination/treatment/procedure(s) were performed by resident physician or non-physician practitioner and as supervising physician I was immediately available for consultation/collaboration.   Lajada Janes DOUGLAS MD.    Lamona Eimer D Lakara Weiland, MD 03/14/12 1437 

## 2012-04-22 ENCOUNTER — Other Ambulatory Visit (HOSPITAL_COMMUNITY): Payer: Self-pay | Admitting: Nurse Practitioner

## 2012-04-22 DIAGNOSIS — Z3689 Encounter for other specified antenatal screening: Secondary | ICD-10-CM

## 2012-04-22 LAB — OB RESULTS CONSOLE RPR: RPR: NONREACTIVE

## 2012-04-22 LAB — CYTOLOGY - PAP: Pap Smear: NEGATIVE

## 2012-04-22 LAB — OB RESULTS CONSOLE GBS: GBS: POSITIVE

## 2012-04-22 LAB — OB RESULTS CONSOLE GC/CHLAMYDIA: Gonorrhea: NEGATIVE

## 2012-04-22 LAB — OB RESULTS CONSOLE HIV ANTIBODY (ROUTINE TESTING): HIV: NONREACTIVE

## 2012-04-22 LAB — CYSTIC FIBROSIS DIAGNOSTIC STUDY: Interpretation-CFDNA:: NEGATIVE

## 2012-05-02 ENCOUNTER — Inpatient Hospital Stay (HOSPITAL_COMMUNITY)
Admission: AD | Admit: 2012-05-02 | Discharge: 2012-05-02 | Disposition: A | Discharge status: Home / Self Care | Payer: Medicaid Other | Source: Ambulatory Visit | Attending: Obstetrics & Gynecology | Admitting: Obstetrics & Gynecology

## 2012-05-02 ENCOUNTER — Encounter (HOSPITAL_COMMUNITY): Payer: Self-pay | Admitting: *Deleted

## 2012-05-02 DIAGNOSIS — O99891 Other specified diseases and conditions complicating pregnancy: Secondary | ICD-10-CM | POA: Insufficient documentation

## 2012-05-02 DIAGNOSIS — Y92009 Unspecified place in unspecified non-institutional (private) residence as the place of occurrence of the external cause: Secondary | ICD-10-CM | POA: Insufficient documentation

## 2012-05-02 DIAGNOSIS — IMO0002 Reserved for concepts with insufficient information to code with codable children: Secondary | ICD-10-CM

## 2012-05-02 MED ORDER — ACETAMINOPHEN 325 MG PO TABS
650.0000 mg | ORAL_TABLET | Freq: Once | ORAL | Status: AC
  Administered 2012-05-02: 650 mg via ORAL
  Filled 2012-05-02: qty 2

## 2012-05-02 MED ORDER — HYDROCODONE-ACETAMINOPHEN 5-500 MG PO TABS: 1.0000 | ORAL_TABLET | Freq: Four times a day (QID) | ORAL | Status: DC | PRN
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
## 2012-05-14 ENCOUNTER — Ambulatory Visit (HOSPITAL_COMMUNITY)
Admission: RE | Admit: 2012-05-14 | Discharge: 2012-05-14 | Disposition: A | Discharge status: Home / Self Care | Payer: Medicaid Other | Source: Ambulatory Visit | Attending: Nurse Practitioner | Admitting: Nurse Practitioner

## 2012-05-14 DIAGNOSIS — O358XX0 Maternal care for other (suspected) fetal abnormality and damage, not applicable or unspecified: Secondary | ICD-10-CM | POA: Insufficient documentation

## 2012-05-14 DIAGNOSIS — Z1389 Encounter for screening for other disorder: Secondary | ICD-10-CM | POA: Insufficient documentation

## 2012-05-14 DIAGNOSIS — Z363 Encounter for antenatal screening for malformations: Secondary | ICD-10-CM | POA: Insufficient documentation

## 2012-05-14 DIAGNOSIS — Z3689 Encounter for other specified antenatal screening: Secondary | ICD-10-CM
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
## 2012-07-01 NOTE — L&D Delivery Note (Signed)
Delivery Note Progressed to complete dilation and pushed well.   At 6:53 PM a viable and healthy female was delivered via Vaginal, Spontaneous Delivery (Presentation: Right Occiput Anterior).  APGAR: 9, 9; weight pending .   Placenta status: Intact, Spontaneous.  Cord: 3 vessels with the following complications: None.   Anesthesia: Epidural  Episiotomy: None Lacerations: None Suture Repair: n/a Est. Blood Loss (mL): 150  Mom to postpartum.  Baby to nursery-stable.  [facility] 10/14/2012, 7:27 PM

## 2012-07-01 NOTE — L&D Delivery Note (Signed)
Attestation of Attending Supervision of Advanced Practitioner (CNM/NP): Evaluation and management procedures were performed by the Advanced Practitioner under my supervision and collaboration.  I have reviewed the Advanced Practitioner's note and chart, and I agree with the management and plan.  Kariel Skillman 10/22/2012 9:22 PM  

## 2012-07-13 ENCOUNTER — Other Ambulatory Visit (HOSPITAL_COMMUNITY): Payer: Self-pay | Admitting: Physician Assistant

## 2012-07-13 DIAGNOSIS — Z3689 Encounter for other specified antenatal screening: Secondary | ICD-10-CM

## 2012-07-16 ENCOUNTER — Ambulatory Visit (HOSPITAL_COMMUNITY)
Admission: RE | Admit: 2012-07-16 | Discharge: 2012-07-16 | Disposition: A | Discharge status: Home / Self Care | Payer: 59 | Source: Ambulatory Visit | Attending: Physician Assistant | Admitting: Physician Assistant

## 2012-07-16 DIAGNOSIS — Z3689 Encounter for other specified antenatal screening: Secondary | ICD-10-CM | POA: Insufficient documentation

## 2012-10-07 ENCOUNTER — Other Ambulatory Visit (HOSPITAL_COMMUNITY): Payer: Self-pay | Admitting: Physician Assistant

## 2012-10-07 DIAGNOSIS — O480 Post-term pregnancy: Secondary | ICD-10-CM

## 2012-10-08 ENCOUNTER — Encounter (HOSPITAL_COMMUNITY): Payer: Self-pay | Admitting: *Deleted

## 2012-10-08 ENCOUNTER — Telehealth (HOSPITAL_COMMUNITY): Payer: Self-pay | Admitting: *Deleted

## 2012-10-08 NOTE — Telephone Encounter (Signed)
Preadmission screen  

## 2012-10-09 ENCOUNTER — Ambulatory Visit (HOSPITAL_COMMUNITY)
Admission: RE | Admit: 2012-10-09 | Discharge: 2012-10-09 | Disposition: A | Discharge status: Home / Self Care | Payer: 59 | Source: Ambulatory Visit | Attending: Physician Assistant | Admitting: Physician Assistant

## 2012-10-09 DIAGNOSIS — Z3689 Encounter for other specified antenatal screening: Secondary | ICD-10-CM | POA: Insufficient documentation

## 2012-10-09 DIAGNOSIS — O480 Post-term pregnancy: Secondary | ICD-10-CM | POA: Insufficient documentation

## 2012-10-13 ENCOUNTER — Other Ambulatory Visit (HOSPITAL_COMMUNITY): Payer: Self-pay | Admitting: Family

## 2012-10-13 DIAGNOSIS — O480 Post-term pregnancy: Secondary | ICD-10-CM

## 2012-10-14 ENCOUNTER — Encounter (HOSPITAL_COMMUNITY): Payer: Self-pay | Admitting: Anesthesiology

## 2012-10-14 ENCOUNTER — Inpatient Hospital Stay (HOSPITAL_COMMUNITY)
Admission: AD | Admit: 2012-10-14 | Discharge: 2012-10-16 | DRG: 775 | Disposition: A | Discharge status: Home / Self Care | Payer: Medicaid Other | Source: Ambulatory Visit | Attending: Obstetrics and Gynecology | Admitting: Obstetrics and Gynecology

## 2012-10-14 ENCOUNTER — Encounter (HOSPITAL_COMMUNITY): Payer: Self-pay | Admitting: *Deleted

## 2012-10-14 ENCOUNTER — Inpatient Hospital Stay (HOSPITAL_COMMUNITY)
Admission: AD | Admit: 2012-10-14 | Discharge: 2012-10-14 | Disposition: A | Discharge status: Home / Self Care | Payer: Medicaid Other | Source: Ambulatory Visit | Attending: Obstetrics and Gynecology | Admitting: Obstetrics and Gynecology

## 2012-10-14 ENCOUNTER — Inpatient Hospital Stay (HOSPITAL_COMMUNITY): Payer: Medicaid Other | Admitting: Anesthesiology

## 2012-10-14 DIAGNOSIS — Z22330 Carrier of Group B streptococcus: Secondary | ICD-10-CM

## 2012-10-14 DIAGNOSIS — O99334 Smoking (tobacco) complicating childbirth: Secondary | ICD-10-CM | POA: Diagnosis present

## 2012-10-14 DIAGNOSIS — O3410 Maternal care for benign tumor of corpus uteri, unspecified trimester: Secondary | ICD-10-CM | POA: Diagnosis present

## 2012-10-14 DIAGNOSIS — O479 False labor, unspecified: Secondary | ICD-10-CM | POA: Insufficient documentation

## 2012-10-14 DIAGNOSIS — D259 Leiomyoma of uterus, unspecified: Secondary | ICD-10-CM | POA: Diagnosis present

## 2012-10-14 DIAGNOSIS — O99892 Other specified diseases and conditions complicating childbirth: Principal | ICD-10-CM | POA: Diagnosis present

## 2012-10-14 DIAGNOSIS — D649 Anemia, unspecified: Secondary | ICD-10-CM | POA: Diagnosis present

## 2012-10-14 DIAGNOSIS — O9989 Other specified diseases and conditions complicating pregnancy, childbirth and the puerperium: Secondary | ICD-10-CM

## 2012-10-14 DIAGNOSIS — IMO0001 Reserved for inherently not codable concepts without codable children: Secondary | ICD-10-CM

## 2012-10-14 DIAGNOSIS — N949 Unspecified condition associated with female genital organs and menstrual cycle: Secondary | ICD-10-CM | POA: Insufficient documentation

## 2012-10-14 LAB — URINALYSIS, ROUTINE W REFLEX MICROSCOPIC
Bilirubin Urine: NEGATIVE
Glucose, UA: NEGATIVE mg/dL
Protein, ur: NEGATIVE mg/dL
Urobilinogen, UA: 0.2 mg/dL (ref 0.0–1.0)

## 2012-10-14 LAB — CBC
Hemoglobin: 9.9 g/dL — ABNORMAL LOW (ref 12.0–15.0)
MCHC: 32.9 g/dL (ref 30.0–36.0)
WBC: 6.9 10*3/uL (ref 4.0–10.5)

## 2012-10-14 LAB — RPR: RPR Ser Ql: NONREACTIVE

## 2012-10-14 LAB — URINE MICROSCOPIC-ADD ON

## 2012-10-14 MED ORDER — LACTATED RINGERS IV SOLN
500.0000 mL | Freq: Once | INTRAVENOUS | Status: AC
  Administered 2012-10-14: 500 mL via INTRAVENOUS

## 2012-10-14 MED ORDER — ONDANSETRON HCL 4 MG/2ML IJ SOLN: 4.0000 mg | INTRAMUSCULAR | Status: DC | PRN

## 2012-10-14 MED ORDER — OXYCODONE-ACETAMINOPHEN 5-325 MG PO TABS: 1.0000 | ORAL_TABLET | ORAL | Status: DC | PRN

## 2012-10-14 MED ORDER — DIPHENHYDRAMINE HCL 50 MG/ML IJ SOLN: 12.5000 mg | INTRAMUSCULAR | Status: DC | PRN

## 2012-10-14 MED ORDER — ONDANSETRON HCL 4 MG/2ML IJ SOLN: 4.0000 mg | Freq: Four times a day (QID) | INTRAMUSCULAR | Status: DC | PRN

## 2012-10-14 MED ORDER — SENNOSIDES-DOCUSATE SODIUM 8.6-50 MG PO TABS: 2.0000 | ORAL_TABLET | Freq: Every day | ORAL | Status: DC

## 2012-10-14 MED ORDER — OXYTOCIN BOLUS FROM INFUSION
500.0000 mL | INTRAVENOUS | Status: DC
  Administered 2012-10-14: 500 mL via INTRAVENOUS

## 2012-10-14 MED ORDER — PRENATAL MULTIVITAMIN CH
1.0000 | ORAL_TABLET | Freq: Every day | ORAL | Status: DC
  Administered 2012-10-15 – 2012-10-16 (×2): 1 via ORAL
  Filled 2012-10-14 (×2): qty 1

## 2012-10-14 MED ORDER — ZOLPIDEM TARTRATE 5 MG PO TABS: 5.0000 mg | ORAL_TABLET | Freq: Every evening | ORAL | Status: DC | PRN

## 2012-10-14 MED ORDER — LACTATED RINGERS IV SOLN: 500.0000 mL | INTRAVENOUS | Status: DC | PRN

## 2012-10-14 MED ORDER — WITCH HAZEL-GLYCERIN EX PADS: 1.0000 "application " | MEDICATED_PAD | CUTANEOUS | Status: DC | PRN

## 2012-10-14 MED ORDER — ACETAMINOPHEN 325 MG PO TABS: 650.0000 mg | ORAL_TABLET | ORAL | Status: DC | PRN

## 2012-10-14 MED ORDER — LIDOCAINE HCL (PF) 1 % IJ SOLN
INTRAMUSCULAR | Status: DC | PRN
  Administered 2012-10-14 (×2): 4 mL

## 2012-10-14 MED ORDER — LACTATED RINGERS IV SOLN: INTRAVENOUS | Status: DC

## 2012-10-14 MED ORDER — SODIUM CHLORIDE 0.9 % IV SOLN
2.0000 g | Freq: Once | INTRAVENOUS | Status: AC
  Administered 2012-10-14: 2 g via INTRAVENOUS
  Filled 2012-10-14: qty 2000

## 2012-10-14 MED ORDER — SIMETHICONE 80 MG PO CHEW: 80.0000 mg | CHEWABLE_TABLET | ORAL | Status: DC | PRN

## 2012-10-14 MED ORDER — FLEET ENEMA 7-19 GM/118ML RE ENEM: 1.0000 | ENEMA | RECTAL | Status: DC | PRN

## 2012-10-14 MED ORDER — OXYTOCIN 40 UNITS IN LACTATED RINGERS INFUSION - SIMPLE MED
62.5000 mL/h | INTRAVENOUS | Status: DC
  Administered 2012-10-14: 999 mL/h via INTRAVENOUS
  Filled 2012-10-14: qty 1000

## 2012-10-14 MED ORDER — BENZOCAINE-MENTHOL 20-0.5 % EX AERO
1.0000 "application " | INHALATION_SPRAY | CUTANEOUS | Status: DC | PRN
  Administered 2012-10-14: 1 via TOPICAL
  Filled 2012-10-14: qty 56

## 2012-10-14 MED ORDER — CITRIC ACID-SODIUM CITRATE 334-500 MG/5ML PO SOLN: 30.0000 mL | ORAL | Status: DC | PRN

## 2012-10-14 MED ORDER — ONDANSETRON HCL 4 MG PO TABS: 4.0000 mg | ORAL_TABLET | ORAL | Status: DC | PRN

## 2012-10-14 MED ORDER — TETANUS-DIPHTH-ACELL PERTUSSIS 5-2.5-18.5 LF-MCG/0.5 IM SUSP
0.5000 mL | Freq: Once | INTRAMUSCULAR | Status: AC
  Administered 2012-10-15: 0.5 mL via INTRAMUSCULAR
  Filled 2012-10-14: qty 0.5

## 2012-10-14 MED ORDER — PHENYLEPHRINE 40 MCG/ML (10ML) SYRINGE FOR IV PUSH (FOR BLOOD PRESSURE SUPPORT)
80.0000 ug | PREFILLED_SYRINGE | INTRAVENOUS | Status: DC | PRN
  Filled 2012-10-14: qty 2
  Filled 2012-10-14: qty 5

## 2012-10-14 MED ORDER — OXYCODONE-ACETAMINOPHEN 5-325 MG PO TABS
1.0000 | ORAL_TABLET | ORAL | Status: DC | PRN
  Administered 2012-10-15 – 2012-10-16 (×2): 1 via ORAL
  Filled 2012-10-14 (×2): qty 1

## 2012-10-14 MED ORDER — EPHEDRINE 5 MG/ML INJ
10.0000 mg | INTRAVENOUS | Status: DC | PRN
  Filled 2012-10-14: qty 2

## 2012-10-14 MED ORDER — FENTANYL 2.5 MCG/ML BUPIVACAINE 1/10 % EPIDURAL INFUSION (WH - ANES)
14.0000 mL/h | INTRAMUSCULAR | Status: DC | PRN
  Filled 2012-10-14: qty 125

## 2012-10-14 MED ORDER — IBUPROFEN 600 MG PO TABS
600.0000 mg | ORAL_TABLET | Freq: Four times a day (QID) | ORAL | Status: DC | PRN
  Administered 2012-10-14: 600 mg via ORAL
  Filled 2012-10-14: qty 1

## 2012-10-14 MED ORDER — IBUPROFEN 600 MG PO TABS
600.0000 mg | ORAL_TABLET | Freq: Four times a day (QID) | ORAL | Status: DC
  Administered 2012-10-15 – 2012-10-16 (×7): 600 mg via ORAL
  Filled 2012-10-14 (×7): qty 1

## 2012-10-14 MED ORDER — LIDOCAINE HCL (PF) 1 % IJ SOLN
30.0000 mL | INTRAMUSCULAR | Status: DC | PRN
  Filled 2012-10-14 (×2): qty 30

## 2012-10-14 MED ORDER — EPHEDRINE 5 MG/ML INJ
10.0000 mg | INTRAVENOUS | Status: DC | PRN
  Filled 2012-10-14: qty 4
  Filled 2012-10-14: qty 2

## 2012-10-14 MED ORDER — FENTANYL 2.5 MCG/ML BUPIVACAINE 1/10 % EPIDURAL INFUSION (WH - ANES)
INTRAMUSCULAR | Status: DC | PRN
  Administered 2012-10-14: 14 mL/h via EPIDURAL

## 2012-10-14 MED ORDER — PHENYLEPHRINE 40 MCG/ML (10ML) SYRINGE FOR IV PUSH (FOR BLOOD PRESSURE SUPPORT)
80.0000 ug | PREFILLED_SYRINGE | INTRAVENOUS | Status: DC | PRN
  Filled 2012-10-14: qty 2

## 2012-10-14 MED ORDER — DIBUCAINE 1 % RE OINT: 1.0000 "application " | TOPICAL_OINTMENT | RECTAL | Status: DC | PRN

## 2012-10-14 MED ORDER — LACTATED RINGERS IV SOLN
INTRAVENOUS | Status: DC
  Administered 2012-10-14: 16:00:00 via INTRAVENOUS

## 2012-10-14 MED ORDER — DIPHENHYDRAMINE HCL 25 MG PO CAPS: 25.0000 mg | ORAL_CAPSULE | Freq: Four times a day (QID) | ORAL | Status: DC | PRN

## 2012-10-14 MED ORDER — LANOLIN HYDROUS EX OINT: TOPICAL_OINTMENT | CUTANEOUS | Status: DC | PRN

## 2012-10-14 NOTE — MAU Note (Signed)
C/o vaginal pressure and ucs since yesterday around 1830;

## 2012-10-14 NOTE — MAU Provider Note (Signed)
History     CSN: [id number]  Arrival date and time: 10/14/12 0957   None     Chief Complaint  Patient presents with  . Vaginal Pain   HPI This is a 27 y.o. female at [redacted]w[redacted]d who presents for evaluation of labor. Denies leaking or bleeding and reports + FM.  OB History   Grav Para Term Preterm Abortions TAB SAB Ect Mult Living   4 2 2  0 1 1 0 0 0 2      Past Medical History  Diagnosis Date  . Gonorrhea   . Chlamydia   . Arrhythmia   . Hx of migraine headaches   . Headache   . Anemia     first and sec pregnancies  . Fibroid   . Leiomyoma of uterus in pregnancy   . Hemorrhoids     Past Surgical History  Procedure Laterality Date  . Wisdom tooth extraction    . Induced abortion  March  2 ,2013    Family History  Problem Relation Age of Onset  . Other Neg Hx   . Alcohol abuse Neg Hx   . Arthritis Neg Hx   . Asthma Neg Hx   . Birth defects Neg Hx   . Cancer Neg Hx   . COPD Neg Hx   . Depression Neg Hx   . Diabetes Neg Hx   . Drug abuse Neg Hx   . Early death Neg Hx   . Heart disease Neg Hx   . Hearing loss Neg Hx   . Hyperlipidemia Neg Hx   . Hypertension Neg Hx   . Kidney disease Neg Hx   . Learning disabilities Neg Hx   . Mental illness Neg Hx   . Mental retardation Neg Hx   . Miscarriages / Stillbirths Neg Hx   . Stroke Neg Hx   . Vision loss Neg Hx     History  Substance Use Topics  . Smoking status: Never Smoker   . Smokeless tobacco: Never Used  . Alcohol Use: No     Comment: weekly- 1/2 bottle liquor    Allergies: No Known Allergies  Prescriptions prior to admission  Medication Sig Dispense Refill  . acetaminophen (TYLENOL) 500 MG tablet Take 500 mg by mouth every 6 (six) hours as needed for pain.      Marland Kitchen doxylamine, Sleep, (UNISOM) 25 MG tablet Take 25 mg by mouth at bedtime as needed for sleep.      . Prenatal Vit-Fe Fumarate-FA (PRENATAL MULTIVITAMIN) TABS Take 1 tablet by mouth daily at 12 noon.        ROS Positive for painful  contractions Denies leaking Denies bleeding Denies fever  Physical Exam   Blood pressure 130/82, pulse 76, temperature 97.6 F (36.4 C), temperature source Oral, resp. rate 18, height 5\' 3"  (1.6 m), weight 181 lb (82.101 kg), last menstrual period 01/01/2012.  Physical Exam No apparent distress, but breathes through irregular contractions HR RRR Lungs clear Abdomen gravid, nontender FHR reactive UCs every 10 minutes Ext WNL  Dilation: 3 Effacement (%): 60 Cervical Position: Anterior Station: -2 Presentation: Vertex Exam by:: Morrison Old RN  No change after one hour  MAU Course  Procedures  Assessment and Plan  A:  SIUP at [redacted]w[redacted]d        Prodromal contractions       No change in cervix P:  Discharge       Labor precautions       Limited Rx Percocet  for sleep/comfort  [facility] 10/14/2012, 11:45 AM

## 2012-10-14 NOTE — H&P (Signed)
Sonia Allen is a 27 y.o. female presenting for Labor . Maternal Medical History:  Reason for admission: Contractions.  Nausea.  Contractions: Onset was 6-12 hours ago.   Frequency: regular.   Perceived severity is strong.    Fetal activity: Perceived fetal activity is normal.   Last perceived fetal movement was within the past hour.    Prenatal complications: Bleeding (show).   Prenatal Complications - Diabetes: none.    OB History   Grav Para Term Preterm Abortions TAB SAB Ect Mult Living   4 2 2  0 1 1 0 0 0 2     Past Medical History  Diagnosis Date  . Gonorrhea   . Chlamydia   . Arrhythmia   . Hx of migraine headaches   . Headache   . Anemia     first and sec pregnancies  . Fibroid   . Leiomyoma of uterus in pregnancy   . Hemorrhoids    Past Surgical History  Procedure Laterality Date  . Wisdom tooth extraction    . Induced abortion  March  2 ,2013   Family History: family history is negative for Other, and Alcohol abuse, and Arthritis, and Asthma, and Birth defects, and Cancer, and COPD, and Depression, and Diabetes, and Drug abuse, and Early death, and Heart disease, and Hearing loss, and Hyperlipidemia, and Hypertension, and Kidney disease, and Learning disabilities, and Mental illness, and Mental retardation, and Miscarriages / Stillbirths, and Stroke, and Vision loss, . Social History:  reports that she has never smoked. She has never used smokeless tobacco. She reports that she does not drink alcohol or use illicit drugs.   Prenatal Transfer Tool  Maternal Diabetes: No Genetic Screening: Normal Maternal Ultrasounds/Referrals: Normal Fetal Ultrasounds or other Referrals:  None Maternal Substance Abuse:  Yes:  Type: Smoker Significant Maternal Medications:  None Significant Maternal Lab Results:  None except GBS Other Comments:  None  Review of Systems  Constitutional: Positive for malaise/fatigue. Negative for fever and chills.  Cardiovascular:  Negative for chest pain.  Gastrointestinal: Positive for abdominal pain. Negative for nausea, vomiting, diarrhea and constipation.  Genitourinary: Negative for dysuria.    Dilation: 8 Effacement (%): 90 Station: -2 Exam by:: L. Lima ,RN Blood pressure 127/83, pulse 78, temperature 98.5 F (36.9 C), temperature source Oral, resp. rate 18, last menstrual period 01/01/2012. Maternal Exam:  Uterine Assessment: Contraction strength is firm.  Contraction frequency is regular.   Abdomen: Fundal height is 37.   Estimated fetal weight is 7.5.   Fetal presentation: vertex  Introitus: Normal vulva. Normal vagina.  Ferning test: not done.  Nitrazine test: not done. Amniotic fluid character: not assessed.  Pelvis: adequate for delivery.   Cervix: Cervix evaluated by digital exam.     Fetal Exam Fetal Monitor Review: Mode: ultrasound.   Baseline rate: 140.  Variability: moderate (6-25 bpm).   Pattern: accelerations present and no decelerations.    Fetal State Assessment: Category I - tracings are normal.     Physical Exam  Constitutional: She is oriented to person, place, and time. She appears well-developed and well-nourished. She appears distressed (with pain).  HENT:  Head: Normocephalic.  Cardiovascular: Normal rate, regular rhythm and normal heart sounds.  Exam reveals no gallop and no friction rub.   No murmur heard. Respiratory: Effort normal and breath sounds normal. No respiratory distress. She has no wheezes. She has no rales.  GI: Soft. She exhibits no distension. There is no tenderness. There is no rebound and no guarding.  Genitourinary: Vagina normal and uterus normal.  Musculoskeletal: Normal range of motion.  Neurological: She is alert and oriented to person, place, and time.  Skin: Skin is warm and dry.  Psychiatric: She has a normal mood and affect.    Dilation: 8 Effacement (%): 90 Cervical Position: Middle Station: -2 Presentation: Vertex Exam by:: Currie Paris ,RN  Prenatal labs: ABO, Rh: B/Positive/-- (10/23 0000) Antibody: Negative (10/23 0000) Rubella: Immune (10/23 0000) RPR: Nonreactive, Nonreactive (10/23 0000)  HBsAg: Negative (10/23 0000)  HIV: Non-reactive, Non-reactive (10/23 0000)  GBS: Positive (10/23 0000)   Assessment/Plan: A:  SIUP at [redacted]w[redacted]d        Active Labor       GBS + P:  Admit to [employer]      Routine orders      Penicilin      Epidural      Anticipate SVD   [facility] 10/14/2012, 5:27 PM

## 2012-10-14 NOTE — Anesthesia Procedure Notes (Signed)
Epidural Patient location during procedure: OB Start time: 10/14/2012 4:44 PM  Staffing Anesthesiologist: Lindbergh Winkles A. Performed by: anesthesiologist   Preanesthetic Checklist Completed: patient identified, site marked, surgical consent, pre-op evaluation, timeout performed, IV checked, risks and benefits discussed and monitors and equipment checked  Epidural Patient position: sitting Prep: site prepped and draped and DuraPrep Patient monitoring: continuous pulse ox and blood pressure Approach: midline Injection technique: LOR air  Needle:  Needle type: Tuohy  Needle gauge: 17 G Needle length: 9 cm and 9 Needle insertion depth: 4 cm Catheter type: closed end flexible Catheter size: 19 Gauge Catheter at skin depth: 9 cm Test dose: negative and Other  Assessment Events: blood not aspirated, injection not painful, no injection resistance, negative IV test and no paresthesia  Additional Notes Patient identified. Risks and benefits discussed including failed block, incomplete  Pain control, post dural puncture headache, nerve damage, paralysis, blood pressure Changes, nausea, vomiting, reactions to medications-both toxic and allergic and post Partum back pain. All questions were answered. Patient expressed understanding and wished to proceed. Sterile technique was used throughout procedure. Epidural site was Dressed with sterile barrier dressing. No paresthesias, signs of intravascular injection Or signs of intrathecal spread were encountered.  Patient was more comfortable after the epidural was dosed. Please see RN's note for documentation of vital signs and FHR which are stable.

## 2012-10-14 NOTE — Anesthesia Preprocedure Evaluation (Signed)
Anesthesia Evaluation  Patient identified by MRN, date of birth, ID band Patient awake    Reviewed: Allergy & Precautions, H&P , Patient's Chart, lab work & pertinent test results  Airway Mallampati: III TM Distance: >3 FB Neck ROM: full    Dental no notable dental hx. (+) Teeth Intact   Pulmonary neg pulmonary ROS,  breath sounds clear to auscultation  Pulmonary exam normal       Cardiovascular negative cardio ROS  Rhythm:regular Rate:Normal     Neuro/Psych  Headaches, negative psych ROS   GI/Hepatic negative GI ROS, Neg liver ROS,   Endo/Other  negative endocrine ROS  Renal/GU negative Renal ROS  negative genitourinary   Musculoskeletal   Abdominal Normal abdominal exam  (+)   Peds  Hematology  (+) anemia ,   Anesthesia Other Findings   Reproductive/Obstetrics (+) Pregnancy Fibroid Uterus                           Anesthesia Physical Anesthesia Plan  ASA: II  Anesthesia Plan: Epidural   Post-op Pain Management:    Induction:   Airway Management Planned:   Additional Equipment:   Intra-op Plan:   Post-operative Plan:   Informed Consent: I have reviewed the patients History and Physical, chart, labs and discussed the procedure including the risks, benefits and alternatives for the proposed anesthesia with the patient or authorized representative who has indicated his/her understanding and acceptance.     Plan Discussed with: Anesthesiologist  Anesthesia Plan Comments:         Anesthesia Quick Evaluation

## 2012-10-14 NOTE — MAU Note (Signed)
Was seen in mau earlier. States UC's stronger, can't take it anymore

## 2012-10-15 ENCOUNTER — Ambulatory Visit (HOSPITAL_COMMUNITY): Admission: RE | Admit: 2012-10-15 | Payer: Medicaid Other | Source: Ambulatory Visit

## 2012-10-15 NOTE — Progress Notes (Signed)
I spoke with and examined patient and agree with resident's note and plan of care.  Pt desires circumcision, was unsure if her insurance covered it- states she has [facility] and Medicaid.  Will discuss w/ oncoming shift.  Cheral Marker, CNM, [facility] 10/15/2012 7:26 AM

## 2012-10-15 NOTE — H&P (Signed)
Attestation of Attending Supervision of Advanced Practitioner (CNM/NP): Evaluation and management procedures were performed by the Advanced Practitioner under my supervision and collaboration.  I have reviewed the Advanced Practitioner's note and chart, and I agree with the management and plan.  Keniel Ralston 10/15/2012 12:49 PM   

## 2012-10-15 NOTE — Progress Notes (Signed)
Post Partum Day 1 Subjective: up ad lib, voiding, tolerating PO and + flatus  Objective: Blood pressure 120/63, pulse 85, temperature 98.3 F (36.8 C), temperature source Oral, resp. rate 18, height 5\' 3"  (1.6 m), weight 181 lb (82.101 kg), last menstrual period 01/01/2012, unknown if currently breastfeeding.  Physical Exam:  General: alert, cooperative and no distress Lochia: appropriate Uterine Fundus: firm Incision: n/a DVT Evaluation: No evidence of DVT seen on physical exam. Negative Homan's sign. No cords or calf tenderness.   Recent Labs  10/14/12 1649  HGB 9.9*  HCT 30.1*    Assessment/Plan: Plan for discharge tomorrow (desires to stay until tomorrow) Bottle feeding Plans to use depo for birth control   LOS: 1 day   Sonia Allen 10/15/2012, 6:08 AM

## 2012-10-15 NOTE — MAU Provider Note (Signed)
Attestation of Attending Supervision of Advanced Practitioner (CNM/NP): Evaluation and management procedures were performed by the Advanced Practitioner under my supervision and collaboration.  I have reviewed the Advanced Practitioner's note and chart, and I agree with the management and plan.  Leanthony Rhett 10/15/2012 12:49 PM

## 2012-10-15 NOTE — Progress Notes (Signed)
UR chart review completed.  

## 2012-10-15 NOTE — Anesthesia Postprocedure Evaluation (Signed)
  Anesthesia Post-op Note  Patient: Sonia Allen  Procedure(s) Performed: * No procedures listed *  Patient Location: Mother/Baby  Anesthesia Type:Epidural  Level of Consciousness: awake  Airway and Oxygen Therapy: Patient Spontanous Breathing  Post-op Pain: none  Post-op Assessment: Patient's Cardiovascular Status Stable, Respiratory Function Stable, Patent Airway, No signs of Nausea or vomiting, Adequate PO intake and Pain level controlled  Post-op Vital Signs: Reviewed and stable  Complications: No apparent anesthesia complications

## 2012-10-16 MED ORDER — IBUPROFEN 600 MG PO TABS: 600.0000 mg | ORAL_TABLET | Freq: Four times a day (QID) | ORAL | Status: DC

## 2012-10-16 NOTE — Discharge Summary (Signed)
Obstetric Discharge Summary Reason for Admission: onset of labor Prenatal Procedures: ultrasound and normal; Smoker Intrapartum Procedures: spontaneous vaginal delivery Postpartum Procedures: none Complications-Operative and Postpartum: none Hemoglobin  Date Value Range Status  10/14/2012 9.9* 12.0 - 15.0 g/dL Final     HCT  Date Value Range Status  10/14/2012 30.1* 36.0 - 46.0 % Final    Physical Exam:  General: alert, cooperative, appears stated age and no distress Lochia: appropriate Uterine Fundus: firm Incision:   DVT Evaluation: No evidence of DVT seen on physical exam. Negative Homan's sign. No cords or calf tenderness. No significant calf/ankle edema.  Discharge Diagnoses: Term Pregnancy-delivered  Discharge Information: Date: 10/16/2012 Activity: pelvic rest Diet: routine Medications: PNV and Ibuprofen Condition: stable Instructions: refer to practice specific booklet Discharge to: home  Plans Depo  Newborn Data: Live born female  Birth Weight: 7 lb 5.6 oz (3334 g) APGAR: 9, 9  Home with mother and bottle feeding.  Andrena Mews, DO [facility] Resident - PGY-2 10/16/2012 8:41 AM
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
## 2012-10-17 ENCOUNTER — Inpatient Hospital Stay (HOSPITAL_COMMUNITY): Admission: RE | Admit: 2012-10-17 | Payer: Medicaid Other | Source: Ambulatory Visit

## 2013-07-01 NOTE — L&D Delivery Note (Signed)
Attestation of Attending Supervision of Advanced Practitioner (PA/CNM/NP): Evaluation and management procedures were performed by the Advanced Practitioner under my supervision and collaboration.  I have reviewed the Advanced Practitioner's note and chart, and I agree with the management and plan.  Carlus Stay, MD, FACOG Attending Obstetrician & Gynecologist Faculty Practice, [facility]   

## 2013-07-01 NOTE — L&D Delivery Note (Signed)
Delivery Note AROM at 2155> clear. C/C/ +3. Short second stage. At   a viable female was delivered via  (PresentationOA>LOA:Nuchal cord reduced over shoulder ;  ).  APGAR:9 ,9 ; weight .   Placenta status:spont, intactCord:  with the following complications:, 3v, marginal insertion   Anesthesia:  epidural Episiotomy: none Lacerations: none Suture Repair: n/a Est. Blood Loss (mL): 300  Mom to postpartum.  Baby to Couplet care / Skin to Skin.  Allen,Sonia 03/12/2014, 8:05 PM

## 2013-09-27 ENCOUNTER — Encounter (HOSPITAL_COMMUNITY): Payer: Self-pay | Admitting: *Deleted

## 2013-09-27 ENCOUNTER — Inpatient Hospital Stay (HOSPITAL_COMMUNITY)
Admission: AD | Admit: 2013-09-27 | Discharge: 2013-09-28 | Disposition: A | Discharge status: Home / Self Care | Payer: 59 | Source: Ambulatory Visit | Attending: Obstetrics & Gynecology | Admitting: Obstetrics & Gynecology

## 2013-09-27 DIAGNOSIS — B9689 Other specified bacterial agents as the cause of diseases classified elsewhere: Secondary | ICD-10-CM | POA: Diagnosis not present

## 2013-09-27 DIAGNOSIS — M549 Dorsalgia, unspecified: Secondary | ICD-10-CM | POA: Diagnosis not present

## 2013-09-27 DIAGNOSIS — B3731 Acute candidiasis of vulva and vagina: Secondary | ICD-10-CM

## 2013-09-27 DIAGNOSIS — A499 Bacterial infection, unspecified: Secondary | ICD-10-CM | POA: Diagnosis not present

## 2013-09-27 DIAGNOSIS — O2390 Unspecified genitourinary tract infection in pregnancy, unspecified trimester: Secondary | ICD-10-CM | POA: Insufficient documentation

## 2013-09-27 DIAGNOSIS — B373 Candidiasis of vulva and vagina: Secondary | ICD-10-CM

## 2013-09-27 DIAGNOSIS — N760 Acute vaginitis: Secondary | ICD-10-CM | POA: Insufficient documentation

## 2013-09-27 NOTE — MAU Note (Signed)
Pt reports lower back pain and pain in left hip area since positive preg test. LMP12/08/2012

## 2013-09-27 NOTE — MAU Provider Note (Signed)
History     CSN: [id number]  Arrival date and time: 09/27/13 2031   First Provider Initiated Contact with Patient 09/27/13 2303      Chief Complaint  Patient presents with  . Back Pain   HPI [name] is a 28 y.o. [phone] at [redacted]w[redacted]d who presents to MAU today with complaint of low back pain since early pregnancy. She has an appointment to start prenatal care 10/13/13. She denies abdominal pain, vaginal bleeding, discharge, LOF, UTI symptoms or fever. She has had nausea without vomiting and endorses 2 loose stools today. She states that back pain is worse with ambulation and resolved by rest. She denies radiation.   OB History   Grav Para Term Preterm Abortions TAB SAB Ect Mult Living   5 3 3  0 1 1 0 0 0 3      Past Medical History  Diagnosis Date  . Gonorrhea   . Chlamydia   . Arrhythmia   . Hx of migraine headaches   . Headache(784.0)   . Anemia     first and sec pregnancies  . Fibroid   . Leiomyoma of uterus in pregnancy   . Hemorrhoids     Past Surgical History  Procedure Laterality Date  . Wisdom tooth extraction    . Induced abortion  March  2 ,2013    Family History  Problem Relation Age of Onset  . Other Neg Hx   . Alcohol abuse Neg Hx   . Arthritis Neg Hx   . Asthma Neg Hx   . Birth defects Neg Hx   . Cancer Neg Hx   . COPD Neg Hx   . Depression Neg Hx   . Diabetes Neg Hx   . Drug abuse Neg Hx   . Early death Neg Hx   . Heart disease Neg Hx   . Hearing loss Neg Hx   . Hyperlipidemia Neg Hx   . Hypertension Neg Hx   . Kidney disease Neg Hx   . Learning disabilities Neg Hx   . Mental illness Neg Hx   . Mental retardation Neg Hx   . Miscarriages / Stillbirths Neg Hx   . Stroke Neg Hx   . Vision loss Neg Hx     History  Substance Use Topics  . Smoking status: Never Smoker   . Smokeless tobacco: Never Used  . Alcohol Use: No     Comment: weekly- 1/2 bottle liquor    Allergies: No Known Allergies  Prescriptions prior to admission   Medication Sig Dispense Refill  . acetaminophen (TYLENOL) 500 MG tablet Take 500 mg by mouth every 6 (six) hours as needed for pain.      . Prenatal Vit-Fe Fumarate-FA (PRENATAL MULTIVITAMIN) TABS Take 1 tablet by mouth daily at 12 noon.      Marland Kitchen oxyCODONE-acetaminophen (ROXICET) 5-325 MG per tablet Take 1 tablet by mouth every 4 (four) hours as needed for pain.  10 tablet  0  . [DISCONTINUED] ibuprofen (ADVIL,MOTRIN) 600 MG tablet Take 1 tablet (600 mg total) by mouth every 6 (six) hours.  30 tablet  0    Review of Systems  Constitutional: Negative for fever and malaise/fatigue.  Gastrointestinal: Positive for nausea and diarrhea. Negative for vomiting, abdominal pain and constipation.  Genitourinary: Negative for dysuria, urgency and frequency.       Neg - vaginal bleeding, discharge, LOF   Physical Exam   Blood pressure 124/68, pulse 90, temperature 98.9 F (37.2 C), temperature source  Oral, resp. rate 16, height 5\' 3"  (1.6 m), weight 182 lb (82.555 kg), last menstrual period 06/02/2013, SpO2 100.00%, unknown if currently breastfeeding.  Physical Exam  Constitutional: She is oriented to person, place, and time. She appears well-developed and well-nourished. No distress.  HENT:  Head: Normocephalic and atraumatic.  Cardiovascular: Normal rate.   Respiratory: Effort normal.  GI: Soft. Bowel sounds are normal. She exhibits no distension and no mass. There is no tenderness. There is no rebound and no guarding.  Fundus palpated just below the umbilicus  Musculoskeletal: Normal range of motion. She exhibits no edema.       Lumbar back: She exhibits tenderness. She exhibits normal range of motion, no bony tenderness, no swelling, no edema, no deformity, no laceration, no pain and no spasm.  Neg - straight leg raise  Neurological: She is alert and oriented to person, place, and time.  Skin: Skin is warm and dry. No erythema.  Psychiatric: She has a normal mood and affect.   Results for  orders placed during the hospital encounter of 09/27/13 (from the past 24 hour(s))  URINALYSIS, ROUTINE W REFLEX MICROSCOPIC     Status: Abnormal   Collection Time    09/27/13  9:08 PM      Result Value Ref Range   Color, Urine YELLOW  YELLOW   APPearance CLEAR  CLEAR   Specific Gravity, Urine 1.015  1.005 - 1.030   pH 7.0  5.0 - 8.0   Glucose, UA NEGATIVE  NEGATIVE mg/dL   Hgb urine dipstick TRACE (*) NEGATIVE   Bilirubin Urine NEGATIVE  NEGATIVE   Ketones, ur NEGATIVE  NEGATIVE mg/dL   Protein, ur NEGATIVE  NEGATIVE mg/dL   Urobilinogen, UA 0.2  0.0 - 1.0 mg/dL   Nitrite NEGATIVE  NEGATIVE   Leukocytes, UA NEGATIVE  NEGATIVE  URINE MICROSCOPIC-ADD ON     Status: Abnormal   Collection Time    09/27/13  9:08 PM      Result Value Ref Range   Squamous Epithelial / LPF RARE  RARE   WBC, UA 0-2  <3 WBC/hpf   RBC / HPF 0-2  <3 RBC/hpf   Bacteria, UA RARE  RARE   Crystals CA OXALATE CRYSTALS (*) NEGATIVE   Urine-Other AMORPHOUS URATES/PHOSPHATES    WET PREP, GENITAL     Status: Abnormal   Collection Time    09/27/13 11:55 PM      Result Value Ref Range   Yeast Wet Prep HPF POC FEW (*) NONE SEEN   Trich, Wet Prep NONE SEEN  NONE SEEN   Clue Cells Wet Prep HPF POC FEW (*) NONE SEEN   WBC, Wet Prep HPF POC FEW (*) NONE SEEN     MAU Course  Procedures None  MDM FHR - 158 bpm with doppler UA, wet prep and GC/Chlamydia today Diflucan given in MAU  Assessment and Plan  A: Back pain in pregnancy Bacterial vaginosis Yeast vulvovaginitis  P: Discharge home Rx for Flagyl and Diflucan given to patient Advised Tylenol PRN for pain Discussed use of abdominal binder and warm bath for relief Patient advised to follow-up with [facility] as scheduled to start prenatal care Patient may return to MAU as needed or if her condition were to change or worsen  Farris Has, PA-C  09/28/2013, 12:34 AM

## 2013-09-28 DIAGNOSIS — O2390 Unspecified genitourinary tract infection in pregnancy, unspecified trimester: Secondary | ICD-10-CM | POA: Diagnosis not present

## 2013-09-28 LAB — URINALYSIS, ROUTINE W REFLEX MICROSCOPIC
BILIRUBIN URINE: NEGATIVE
GLUCOSE, UA: NEGATIVE mg/dL
KETONES UR: NEGATIVE mg/dL
LEUKOCYTES UA: NEGATIVE
Nitrite: NEGATIVE
PH: 7 (ref 5.0–8.0)
Protein, ur: NEGATIVE mg/dL
Specific Gravity, Urine: 1.015 (ref 1.005–1.030)
Urobilinogen, UA: 0.2 mg/dL (ref 0.0–1.0)

## 2013-09-28 LAB — URINE MICROSCOPIC-ADD ON

## 2013-09-28 LAB — GC/CHLAMYDIA PROBE AMP
CT Probe RNA: NEGATIVE
GC Probe RNA: NEGATIVE

## 2013-09-28 LAB — WET PREP, GENITAL: Trich, Wet Prep: NONE SEEN

## 2013-09-28 MED ORDER — METRONIDAZOLE 500 MG PO TABS
500.0000 mg | ORAL_TABLET | Freq: Two times a day (BID) | ORAL | Status: DC
Start: 2013-09-28 — End: 2013-12-27

## 2013-09-28 MED ORDER — FLUCONAZOLE 150 MG PO TABS
150.0000 mg | ORAL_TABLET | Freq: Once | ORAL | Status: AC
  Administered 2013-09-28: 150 mg via ORAL
  Filled 2013-09-28: qty 1

## 2013-09-28 MED ORDER — FLUCONAZOLE 150 MG PO TABS: 150.0000 mg | ORAL_TABLET | Freq: Every day | ORAL | Status: DC

## 2013-09-28 NOTE — Discharge Instructions (Signed)
Bacterial Vaginosis Bacterial vaginosis is an infection of the vagina. It happens when too many of certain germs (bacteria) grow in the vagina. HOME CARE  Take your medicine as told by your doctor.  Finish your medicine even if you start to feel better.  Do not have sex until you finish your medicine and are better.  Tell your sex partner that you have an infection. They should see their doctor for treatment.  Practice safe sex. Use condoms. Have only one sex partner. GET HELP IF:  You are not getting better after 3 days of treatment.  You have more grey fluid (discharge) coming from your vagina than before.  You have more pain than before.  You have a fever. MAKE SURE YOU:   Understand these instructions.  Will watch your condition.  Will get help right away if you are not doing well or get worse. Document Released: 03/26/2008 Document Revised: 04/07/2013 Document Reviewed: 01/27/2013 [facility] Patient Information 2014 [place]. Candidal Vulvovaginitis Candidal vulvovaginitis is an infection of the vagina and vulva. The vulva is the skin around the opening of the vagina. This may cause itching and discomfort in and around the vagina.  HOME CARE  Only take medicine as told by your doctor.  Do not have sex (intercourse) until the infection is healed or as told by your doctor.  Practice safe sex.  Tell your sex partner about your infection.  Do not douche or use tampons.  Wear cotton underwear. Do not wear tight pants or panty hose.  Eat yogurt. This may help treat and prevent yeast infections. GET HELP RIGHT AWAY IF:   You have a fever.  Your problems get worse during treatment or do not get better in 3 days.  You have discomfort, irritation, or itching in your vagina or vulva area.  You have pain after sex.  You start to get belly (abdominal) pain. MAKE SURE YOU:  Understand these instructions.  Will watch your condition.  Will get help right  away if you are not doing well or get worse. Document Released: 09/13/2008 Document Revised: 09/09/2011 Document Reviewed: 09/13/2008 [facility] Patient Information 2014 [place], [place].

## 2013-10-04 ENCOUNTER — Encounter (HOSPITAL_COMMUNITY): Payer: Self-pay | Admitting: *Deleted

## 2013-10-04 ENCOUNTER — Inpatient Hospital Stay (HOSPITAL_COMMUNITY)
Admission: AD | Admit: 2013-10-04 | Discharge: 2013-10-04 | Disposition: A | Discharge status: Home / Self Care | Payer: 59 | Source: Ambulatory Visit | Attending: Obstetrics & Gynecology | Admitting: Obstetrics & Gynecology

## 2013-10-04 DIAGNOSIS — N898 Other specified noninflammatory disorders of vagina: Secondary | ICD-10-CM

## 2013-10-04 DIAGNOSIS — N760 Acute vaginitis: Secondary | ICD-10-CM | POA: Diagnosis not present

## 2013-10-04 DIAGNOSIS — B3731 Acute candidiasis of vulva and vagina: Secondary | ICD-10-CM | POA: Diagnosis not present

## 2013-10-04 DIAGNOSIS — O2390 Unspecified genitourinary tract infection in pregnancy, unspecified trimester: Secondary | ICD-10-CM | POA: Diagnosis not present

## 2013-10-04 DIAGNOSIS — IMO0002 Reserved for concepts with insufficient information to code with codable children: Secondary | ICD-10-CM | POA: Insufficient documentation

## 2013-10-04 DIAGNOSIS — B9689 Other specified bacterial agents as the cause of diseases classified elsewhere: Secondary | ICD-10-CM | POA: Diagnosis not present

## 2013-10-04 DIAGNOSIS — B373 Candidiasis of vulva and vagina: Secondary | ICD-10-CM | POA: Insufficient documentation

## 2013-10-04 DIAGNOSIS — D259 Leiomyoma of uterus, unspecified: Secondary | ICD-10-CM | POA: Diagnosis not present

## 2013-10-04 DIAGNOSIS — A499 Bacterial infection, unspecified: Secondary | ICD-10-CM | POA: Diagnosis not present

## 2013-10-04 DIAGNOSIS — O26892 Other specified pregnancy related conditions, second trimester: Secondary | ICD-10-CM

## 2013-10-04 DIAGNOSIS — O99891 Other specified diseases and conditions complicating pregnancy: Secondary | ICD-10-CM | POA: Diagnosis not present

## 2013-10-04 DIAGNOSIS — O9989 Other specified diseases and conditions complicating pregnancy, childbirth and the puerperium: Secondary | ICD-10-CM

## 2013-10-04 DIAGNOSIS — O3410 Maternal care for benign tumor of corpus uteri, unspecified trimester: Secondary | ICD-10-CM | POA: Diagnosis not present

## 2013-10-04 LAB — URINALYSIS, ROUTINE W REFLEX MICROSCOPIC
Bilirubin Urine: NEGATIVE
Glucose, UA: NEGATIVE mg/dL
Ketones, ur: NEGATIVE mg/dL
LEUKOCYTES UA: NEGATIVE
NITRITE: NEGATIVE
Protein, ur: NEGATIVE mg/dL
SPECIFIC GRAVITY, URINE: 1.025 (ref 1.005–1.030)
UROBILINOGEN UA: 0.2 mg/dL (ref 0.0–1.0)
pH: 6 (ref 5.0–8.0)

## 2013-10-04 LAB — URINE MICROSCOPIC-ADD ON

## 2013-10-04 NOTE — MAU Provider Note (Signed)
History     CSN: [id number]  Arrival date and time: 10/04/13 1738   First Provider Initiated Contact with Patient 10/04/13 1831      Chief Complaint  Patient presents with  . Vaginal Discharge  . Fatigue   HPI [name] is a 28 y.o. [phone] at [redacted]w[redacted]d who presents to MAU today with complaint of clear vaginal mucus since this morning. She is still taking Flagyl and Diflucan for yeast and BV diagnosed 6 days ago. She denies abdominal pain, vaginal bleeding or fever today. She also states that she felt dizzy on Saturday while she was out shopping. The dizziness passed quickly without syncope.   OB History   Grav Para Term Preterm Abortions TAB SAB Ect Mult Living   5 3 3  0 1 1 0 0 0 3      Past Medical History  Diagnosis Date  . Gonorrhea   . Chlamydia   . Arrhythmia   . Hx of migraine headaches   . Headache(784.0)   . Anemia     first and sec pregnancies  . Fibroid   . Leiomyoma of uterus in pregnancy   . Hemorrhoids     Past Surgical History  Procedure Laterality Date  . Wisdom tooth extraction    . Induced abortion  March  2 ,2013    Family History  Problem Relation Age of Onset  . Other Neg Hx   . Alcohol abuse Neg Hx   . Arthritis Neg Hx   . Asthma Neg Hx   . Birth defects Neg Hx   . Cancer Neg Hx   . COPD Neg Hx   . Depression Neg Hx   . Diabetes Neg Hx   . Drug abuse Neg Hx   . Early death Neg Hx   . Heart disease Neg Hx   . Hearing loss Neg Hx   . Hyperlipidemia Neg Hx   . Hypertension Neg Hx   . Kidney disease Neg Hx   . Learning disabilities Neg Hx   . Mental illness Neg Hx   . Mental retardation Neg Hx   . Miscarriages / Stillbirths Neg Hx   . Stroke Neg Hx   . Vision loss Neg Hx     History  Substance Use Topics  . Smoking status: Never Smoker   . Smokeless tobacco: Never Used  . Alcohol Use: No     Comment: weekly- 1/2 bottle liquor    Allergies: No Known Allergies  Prescriptions prior to admission  Medication Sig Dispense  Refill  . acetaminophen (TYLENOL) 500 MG tablet Take 500 mg by mouth every 6 (six) hours as needed for pain.      . fluconazole (DIFLUCAN) 150 MG tablet Take 1 tablet (150 mg total) by mouth daily.  1 tablet  0  . metroNIDAZOLE (FLAGYL) 500 MG tablet Take 1 tablet (500 mg total) by mouth 2 (two) times daily.  14 tablet  0  . Prenatal Vit-Fe Fumarate-FA (PRENATAL MULTIVITAMIN) TABS Take 1 tablet by mouth daily at 12 noon.        Review of Systems  Constitutional: Negative for fever and malaise/fatigue.  Gastrointestinal: Negative for abdominal pain.  Genitourinary: Negative for dysuria, urgency and frequency.       + vaginal discharge Neg - vaginal bleeding   Physical Exam   Blood pressure 116/59, pulse 90, temperature 98.7 F (37.1 C), temperature source Oral, resp. rate 16, height 5\' 1"  (1.549 m), weight 83.008 kg (183 lb), last  menstrual period 06/02/2013, SpO2 90.00%, unknown if currently breastfeeding.  Physical Exam  Constitutional: She is oriented to person, place, and time. She appears well-developed and well-nourished. No distress.  HENT:  Head: Normocephalic and atraumatic.  Cardiovascular: Normal rate.   Respiratory: Effort normal.  GI: Soft. Bowel sounds are normal. She exhibits no distension and no mass. There is no tenderness. There is no rebound and no guarding.  Neurological: She is alert and oriented to person, place, and time.  Skin: Skin is warm and dry. No erythema.  Psychiatric: She has a normal mood and affect.    MAU Course  Procedures None  MDM FHR 146 bpm with doppler Patient reassured of normal complaints of pregnancy  Assessment and Plan  A: Vaginal discharge in pregnancy  P: Discharge home Patient advised to continue to complete Flagyl and take Diflucan Patient advised to keep scheduled appointment to start prenatal care with [facility] as scheduled Patient may return to MAU as needed or if her condition were to change or worsen  Farris Has,  PA-C  10/04/2013, 6:31 PM

## 2013-10-04 NOTE — MAU Provider Note (Signed)
Attestation of Attending Supervision of Advanced Practitioner (CNM/NP): Evaluation and management procedures were performed by the Advanced Practitioner under my supervision and collaboration.  I have reviewed the Advanced Practitioner's note and chart, and I agree with the management and plan.  HARRAWAY-SMITH, Konstance Happel 10:18 PM

## 2013-10-04 NOTE — Discharge Instructions (Signed)
Second Trimester of Pregnancy The second trimester is from week 13 through week 28, month 4 through 6. This is often the time in pregnancy that you feel your best. Often times, morning sickness has lessened or quit. You may have more energy, and you may get hungry more often. Your unborn baby (fetus) is growing rapidly. At the end of the sixth month, he or she is about 9 inches long and weighs about 1 pounds. You will likely feel the baby move (quickening) between 18 and 20 weeks of pregnancy. HOME CARE   Avoid all smoking, herbs, and alcohol. Avoid drugs not approved by your doctor.  Only take medicine as told by your doctor. Some medicines are safe and some are not during pregnancy.  Exercise only as told by your doctor. Stop exercising if you start having cramps.  Eat regular, healthy meals.  Wear a good support bra if your breasts are tender.  Do not use hot tubs, steam rooms, or saunas.  Wear your seat belt when driving.  Avoid raw meat, uncooked cheese, and liter boxes and soil used by cats.  Take your prenatal vitamins.  Try taking medicine that helps you poop (stool softener) as needed, and if your doctor approves. Eat more fiber by eating fresh fruit, vegetables, and whole grains. Drink enough fluids to keep your pee (urine) clear or pale yellow.  Take warm water baths (sitz baths) to soothe pain or discomfort caused by hemorrhoids. Use hemorrhoid cream if your doctor approves.  If you have puffy, bulging veins (varicose veins), wear support hose. Raise (elevate) your feet for 15 minutes, 3 4 times a day. Limit salt in your diet.  Avoid heavy lifting, wear low heals, and sit up straight.  Rest with your legs raised if you have leg cramps or low back pain.  Visit your dentist if you have not gone during your pregnancy. Use a soft toothbrush to brush your teeth. Be gentle when you floss.  You can have sex (intercourse) unless your doctor tells you not to.  Go to your  doctor visits. GET HELP IF:   You feel dizzy.  You have mild cramps or pressure in your lower belly (abdomen).  You have a nagging pain in your belly area.  You continue to feel sick to your stomach (nauseous), throw up (vomit), or have watery poop (diarrhea).  You have bad smelling fluid coming from your vagina.  You have pain with peeing (urination). GET HELP RIGHT AWAY IF:   You have a fever.  You are leaking fluid from your vagina.  You have spotting or bleeding from your vagina.  You have severe belly cramping or pain.  You lose or gain weight rapidly.  You have trouble catching your breath and have chest pain.  You notice sudden or extreme puffiness (swelling) of your face, hands, ankles, feet, or legs.  You have not felt the baby move in over an hour.  You have severe headaches that do not go away with medicine.  You have vision changes. Document Released: 09/11/2009 Document Revised: 10/12/2012 Document Reviewed: 08/18/2012 [facility] Patient Information 2014 [place], [place].

## 2013-10-04 NOTE — MAU Note (Signed)
Patient state she was recently seen and diagnosed with yeast and BV. Is currently taking Flagyl. States she continues to have a vaginal discharge. States she has periods of weakness. Denies pain, nausea or vomiting.

## 2013-10-18 ENCOUNTER — Other Ambulatory Visit (HOSPITAL_COMMUNITY): Payer: Self-pay | Admitting: Nurse Practitioner

## 2013-10-18 DIAGNOSIS — Z0489 Encounter for examination and observation for other specified reasons: Secondary | ICD-10-CM

## 2013-10-18 DIAGNOSIS — IMO0002 Reserved for concepts with insufficient information to code with codable children: Secondary | ICD-10-CM

## 2013-10-18 LAB — OB RESULTS CONSOLE VARICELLA ZOSTER ANTIBODY, IGG: Varicella: IMMUNE

## 2013-10-18 LAB — SICKLE CELL SCREEN: Sickle Cell Screen: NORMAL

## 2013-10-18 LAB — OB RESULTS CONSOLE ANTIBODY SCREEN: ANTIBODY SCREEN: NEGATIVE

## 2013-10-18 LAB — OB RESULTS CONSOLE ABO/RH: RH TYPE: POSITIVE

## 2013-10-18 LAB — OB RESULTS CONSOLE HIV ANTIBODY (ROUTINE TESTING): HIV: NONREACTIVE

## 2013-10-18 LAB — OB RESULTS CONSOLE HGB/HCT, BLOOD
HEMATOCRIT: 30 %
HEMOGLOBIN: 9.9 g/dL

## 2013-10-18 LAB — CULTURE, OB URINE: URINE CULTURE, OB: NEGATIVE

## 2013-10-18 LAB — OB RESULTS CONSOLE PLATELET COUNT: PLATELETS: 274 10*3/uL

## 2013-10-18 LAB — OB RESULTS CONSOLE RUBELLA ANTIBODY, IGM: Rubella: IMMUNE

## 2013-10-18 LAB — OB RESULTS CONSOLE RPR: RPR: NONREACTIVE

## 2013-10-18 LAB — OB RESULTS CONSOLE GC/CHLAMYDIA
CHLAMYDIA, DNA PROBE: NEGATIVE
GC PROBE AMP, GENITAL: NEGATIVE

## 2013-10-18 LAB — GLUCOSE TOLERANCE, 1 HOUR: Glucose, 1 Hour GTT: 156

## 2013-10-18 LAB — OB RESULTS CONSOLE HEPATITIS B SURFACE ANTIGEN: Hepatitis B Surface Ag: NEGATIVE

## 2013-10-27 ENCOUNTER — Ambulatory Visit (HOSPITAL_COMMUNITY)
Admission: RE | Admit: 2013-10-27 | Discharge: 2013-10-27 | Disposition: A | Discharge status: Home / Self Care | Payer: 59 | Source: Ambulatory Visit | Attending: Nurse Practitioner | Admitting: Nurse Practitioner

## 2013-10-27 DIAGNOSIS — Z1389 Encounter for screening for other disorder: Secondary | ICD-10-CM | POA: Diagnosis present

## 2013-10-27 DIAGNOSIS — Z3689 Encounter for other specified antenatal screening: Secondary | ICD-10-CM | POA: Diagnosis not present

## 2013-10-27 DIAGNOSIS — IMO0002 Reserved for concepts with insufficient information to code with codable children: Secondary | ICD-10-CM

## 2013-10-27 DIAGNOSIS — Z0489 Encounter for examination and observation for other specified reasons: Secondary | ICD-10-CM

## 2013-10-27 DIAGNOSIS — Z363 Encounter for antenatal screening for malformations: Secondary | ICD-10-CM | POA: Insufficient documentation

## 2013-11-08 LAB — GLUCOSE TOLERANCE, 3 HOURS
Glucose, 1 hour: 143
Glucose, Fasting: 78 mg/dL (ref 60–109)
Glucose: 114
Glucose: 92

## 2013-12-27 ENCOUNTER — Encounter (HOSPITAL_COMMUNITY): Payer: Self-pay | Admitting: *Deleted

## 2013-12-27 ENCOUNTER — Inpatient Hospital Stay (HOSPITAL_COMMUNITY)
Admission: AD | Admit: 2013-12-27 | Discharge: 2013-12-27 | Disposition: A | Discharge status: Home / Self Care | Payer: 59 | Source: Ambulatory Visit | Attending: Obstetrics & Gynecology | Admitting: Obstetrics & Gynecology

## 2013-12-27 DIAGNOSIS — O4700 False labor before 37 completed weeks of gestation, unspecified trimester: Secondary | ICD-10-CM | POA: Insufficient documentation

## 2013-12-27 DIAGNOSIS — R51 Headache: Secondary | ICD-10-CM | POA: Diagnosis not present

## 2013-12-27 DIAGNOSIS — O4703 False labor before 37 completed weeks of gestation, third trimester: Secondary | ICD-10-CM

## 2013-12-27 LAB — WET PREP, GENITAL
TRICH WET PREP: NONE SEEN
Yeast Wet Prep HPF POC: NONE SEEN

## 2013-12-27 LAB — FETAL FIBRONECTIN: Fetal Fibronectin: POSITIVE — AB

## 2013-12-27 MED ORDER — METRONIDAZOLE 500 MG PO TABS
500.0000 mg | ORAL_TABLET | Freq: Two times a day (BID) | ORAL | Status: DC
Start: 2013-12-27 — End: 2014-01-24

## 2013-12-27 MED ORDER — BETAMETHASONE SOD PHOS & ACET 6 (3-3) MG/ML IJ SUSP
12.0000 mg | Freq: Once | INTRAMUSCULAR | Status: AC
  Administered 2013-12-27: 12 mg via INTRAMUSCULAR
  Filled 2013-12-27: qty 2

## 2013-12-27 MED ORDER — NIFEDIPINE ER OSMOTIC RELEASE 30 MG PO TB24
30.0000 mg | ORAL_TABLET | Freq: Once | ORAL | Status: AC
  Administered 2013-12-27: 30 mg via ORAL
  Filled 2013-12-27: qty 1

## 2013-12-27 MED ORDER — ACETAMINOPHEN 325 MG PO TABS
650.0000 mg | ORAL_TABLET | Freq: Once | ORAL | Status: AC
  Administered 2013-12-27: 650 mg via ORAL
  Filled 2013-12-27: qty 2

## 2013-12-27 MED ORDER — NIFEDIPINE ER 30 MG PO TB24
30.0000 mg | ORAL_TABLET | Freq: Two times a day (BID) | ORAL | Status: DC | PRN
Start: 2013-12-27 — End: 2014-02-19

## 2013-12-27 NOTE — MAU Note (Signed)
PT SAYS SHE WAS AT WORK-  FROM 3-6-PM-  WHEN SHE WALKS - SHE FEELS PRESSURE.   SHE THINKS SHE HAD UC YESTERDAY.     LAST SEEN  IN [facility]- [facility] [place]-  IN MAY.  NEXT APPOINTMENT - TOMORROW-  AT 0945.  LAST SEX-   Saturday.   DENIES HSV AND MRSA.

## 2013-12-27 NOTE — MAU Provider Note (Signed)
None     Chief Complaint:  Labor Eval   Sonia Allen is  28 y.o. [phone] at [redacted]w[redacted]d presents complaining of contractions on and off for the last several days. Pt reports she has been having contractions for the last 2 weeks and more frequently when standing. They are mildly painful, and resolve when she lays down. Pt states that she has normal fetal movement, no LOF, and no VB. Pt reports that she has been feeling weak and tired over the last several days. She also endorses mild headache. No vision changes, no RUQ pain, no SOB, no CP, no N/V, no d/c, or urinary symptoms. NO other complaints  Obstetrical/Gynecological History: OB History   Grav Para Term Preterm Abortions TAB SAB Ect Mult Living   5 3 3  0 1 1 0 0 0 3     Past Medical History: Past Medical History  Diagnosis Date  . Gonorrhea   . Chlamydia   . Arrhythmia   . Hx of migraine headaches   . Headache(784.0)   . Anemia     first and sec pregnancies  . Fibroid   . Leiomyoma of uterus in pregnancy   . Hemorrhoids     Past Surgical History: Past Surgical History  Procedure Laterality Date  . Wisdom tooth extraction    . Induced abortion  March  2 ,2013    Family History: Family History  Problem Relation Age of Onset  . Other Neg Hx   . Alcohol abuse Neg Hx   . Arthritis Neg Hx   . Asthma Neg Hx   . Birth defects Neg Hx   . Cancer Neg Hx   . COPD Neg Hx   . Depression Neg Hx   . Diabetes Neg Hx   . Drug abuse Neg Hx   . Early death Neg Hx   . Heart disease Neg Hx   . Hearing loss Neg Hx   . Hyperlipidemia Neg Hx   . Hypertension Neg Hx   . Kidney disease Neg Hx   . Learning disabilities Neg Hx   . Mental illness Neg Hx   . Mental retardation Neg Hx   . Miscarriages / Stillbirths Neg Hx   . Stroke Neg Hx   . Vision loss Neg Hx     Social History: History  Substance Use Topics  . Smoking status: Never Smoker   . Smokeless tobacco: Never Used  . Alcohol Use: No     Comment: weekly- 1/2 bottle  liquor    Allergies: No Known Allergies  Meds:  Prescriptions prior to admission  Medication Sig Dispense Refill  . Prenatal Vit-Fe Fumarate-FA (PRENATAL MULTIVITAMIN) TABS Take 1 tablet by mouth daily at 12 noon.        Review of Systems -   Review of Systems  Constitutional: Negative for fever, chills, weight loss, malaise/fatigue and diaphoresis.  HENT: Negative for hearing loss, ear pain, nosebleeds, congestion, sore throat, neck pain, tinnitus and ear discharge.   Eyes: Negative for blurred vision, double vision, photophobia, pain, discharge and redness.  Respiratory: Negative for cough, hemoptysis, sputum production, shortness of breath, wheezing and stridor.   Cardiovascular: Negative for chest pain, palpitations, orthopnea,  leg swelling  Gastrointestinal: Negative for abdominal pain heartburn, nausea, vomiting, diarrhea, constipation, blood in stool Genitourinary: Negative for dysuria, urgency, frequency, hematuria and flank pain.  Musculoskeletal: Negative for myalgias, back pain, joint pain and falls.  Skin: Negative for itching and rash.  Neurological: Negative for dizziness, tingling, tremors,  sensory change, speech change, focal weakness, seizures, loss of consciousness, weakness and headaches.  Endo/Heme/Allergies: Negative for environmental allergies and polydipsia. Does not bruise/bleed easily.  Psychiatric/Behavioral: Negative for depression, suicidal ideas, hallucinations, memory loss and substance abuse. The patient is not nervous/anxious and does not have insomnia.      Physical Exam  Blood pressure 120/66, pulse 103, temperature 98.7 F (37.1 C), temperature source Oral, resp. rate 18, height 5\' 2"  (1.575 m), weight 85.276 kg (188 lb), last menstrual period 06/02/2013, unknown if currently breastfeeding. GENERAL: Well-developed, well-nourished female in no acute distress.  ABDOMEN: Soft, nontender, nondistended, gravid.  EXTREMITIES: Nontender, no edema, 2+  distal pulses. DTR's 2+  Dilation: Closed Effacement (%): Thick Station:  (high) Exam by:: Dr Leslie Andrea SSE: white clumpy discharge  FHT:  Baseline rate 150s bpm   Variability moderate  Accelerations 10x10 present   Decelerations none Contractions: Every 5-10 mins   Labs: Results for orders placed during the hospital encounter of 12/27/13 (from the past 24 hour(s))  WET PREP, GENITAL   Collection Time    12/27/13  9:05 PM      Result Value Ref Range   Yeast Wet Prep HPF POC NONE SEEN  NONE SEEN   Trich, Wet Prep NONE SEEN  NONE SEEN   Clue Cells Wet Prep HPF POC FEW (*) NONE SEEN   WBC, Wet Prep HPF POC FEW (*) NONE SEEN  FETAL FIBRONECTIN   Collection Time    12/27/13  9:05 PM      Result Value Ref Range   Fetal Fibronectin POSITIVE (*) NEGATIVE   Imaging Studies:  No results found.  Assessment: Sonia Allen is  28 y.o. [phone] at [redacted]w[redacted]d presents with preterm labor evaluation .  PTL Eval: pt with closed cervix (reassuring) and semifrequent contractions. FFN is Positive. Wet mount shows Clue cells.  Treat flagyl 500mg  BID x7d, BMZ12mg  today adn toomorrrow, Will discharge home with procardia to take PRN  #Headache: APAP PRN  ODOM, MICHAEL RYAN 6/29/201510:01 PM

## 2013-12-27 NOTE — Discharge Instructions (Signed)

## 2013-12-29 ENCOUNTER — Inpatient Hospital Stay (HOSPITAL_COMMUNITY)
Admission: AD | Admit: 2013-12-29 | Discharge: 2013-12-29 | Disposition: A | Discharge status: Home / Self Care | Payer: 59 | Source: Ambulatory Visit | Attending: Obstetrics & Gynecology | Admitting: Obstetrics & Gynecology

## 2013-12-29 DIAGNOSIS — O4700 False labor before 37 completed weeks of gestation, unspecified trimester: Secondary | ICD-10-CM | POA: Insufficient documentation

## 2013-12-29 MED ORDER — BETAMETHASONE SOD PHOS & ACET 6 (3-3) MG/ML IJ SUSP
12.0000 mg | Freq: Once | INTRAMUSCULAR | Status: AC
  Administered 2013-12-29: 12 mg via INTRAMUSCULAR
  Filled 2013-12-29: qty 2

## 2013-12-29 NOTE — MAU Note (Signed)
PT  HAS RETURNED FOR 2ND DOSE OF BMZ--  SAYS HAS SOME UC-  TOOK PROCARDIA   AT 4 PM.  FHR- 147.Marland Kitchen  NEXT APPOINTMENT 7-2

## 2013-12-30 LAB — GLUCOSE TOLERANCE, 3 HOURS
GLUCOSE: 152
GLUCOSE: 205
Glucose, Fasting: 113 mg/dL — AB (ref 60–109)
Glucose: 114

## 2014-01-10 ENCOUNTER — Ambulatory Visit (INDEPENDENT_AMBULATORY_CARE_PROVIDER_SITE_OTHER): Payer: 59 | Admitting: Obstetrics & Gynecology

## 2014-01-10 ENCOUNTER — Encounter: Payer: 59 | Attending: Obstetrics & Gynecology | Admitting: *Deleted

## 2014-01-10 DIAGNOSIS — O99810 Abnormal glucose complicating pregnancy: Secondary | ICD-10-CM | POA: Diagnosis not present

## 2014-01-10 MED ORDER — GLUCOSE BLOOD VI STRP
ORAL_STRIP | Status: DC
Start: 2014-01-10 — End: 2014-01-17

## 2014-01-10 MED ORDER — ACCU-CHEK FASTCLIX LANCETS MISC: 1.0000 | Freq: Four times a day (QID) | Status: DC

## 2014-01-10 NOTE — Progress Notes (Signed)
Pt seen 01/10/14 for GDM diet education.  Pt's pregravid wt unknown- unable to assess wt gain.  Pt given written and verbal GDM education.  No food allergies.  No N/V reported.  Taking PNV qd.  Pt agrees to follow GDM diet including 3 meals + 3 snacks and proper carbohydrate and protein combination.  Pt has a WIC appt to be certified on 08/23/95.  Follow-up in referred.   Hilaria Ota, MPH RD LDN

## 2014-01-10 NOTE — Progress Notes (Signed)
  Patient was seen on 01/10/14 for Gestational Diabetes self-management . The following learning objectives were met by the patient :   States the definition of Gestational Diabetes  States why dietary management is important in controlling blood glucose  States when to check blood glucose levels  Demonstrates proper blood glucose monitoring techniques  States the effect of stress and exercise on blood glucose levels  Plan:  Consider  increasing your activity level by walking daily as tolerated Begin checking BG before breakfast and 1-2 hours after first bit of breakfast, lunch and dinner after  as directed by MD  Take medication  as directed by MD  Blood glucose monitor given: Accu Chek Nano BG Monitoring Kit Lot # [id number] Exp: 09/29/14 Blood glucose reading: $RemoveBeforeDE'109mg'[id number]$ /dl  Patient instructed to monitor glucose levels: FBS: 60 - <90 2 hour: <120  Patient received the following handouts:  Nutrition Diabetes and Pregnancy  Patient will be seen for follow-up as needed.

## 2014-01-12 ENCOUNTER — Encounter: Payer: Self-pay | Admitting: *Deleted

## 2014-01-13 ENCOUNTER — Other Ambulatory Visit (HOSPITAL_COMMUNITY): Payer: Self-pay | Admitting: Physician Assistant

## 2014-01-13 DIAGNOSIS — O99810 Abnormal glucose complicating pregnancy: Secondary | ICD-10-CM

## 2014-01-13 DIAGNOSIS — O24410 Gestational diabetes mellitus in pregnancy, diet controlled: Secondary | ICD-10-CM

## 2014-01-14 ENCOUNTER — Ambulatory Visit (HOSPITAL_COMMUNITY)
Admission: RE | Admit: 2014-01-14 | Discharge: 2014-01-14 | Disposition: A | Discharge status: Home / Self Care | Payer: 59 | Source: Ambulatory Visit | Attending: Physician Assistant | Admitting: Physician Assistant

## 2014-01-14 DIAGNOSIS — Z3689 Encounter for other specified antenatal screening: Secondary | ICD-10-CM | POA: Insufficient documentation

## 2014-01-14 DIAGNOSIS — O99810 Abnormal glucose complicating pregnancy: Secondary | ICD-10-CM

## 2014-01-14 DIAGNOSIS — O24410 Gestational diabetes mellitus in pregnancy, diet controlled: Secondary | ICD-10-CM

## 2014-01-17 ENCOUNTER — Ambulatory Visit (INDEPENDENT_AMBULATORY_CARE_PROVIDER_SITE_OTHER): Payer: 59 | Admitting: Obstetrics & Gynecology

## 2014-01-17 ENCOUNTER — Encounter: Payer: Self-pay | Admitting: Obstetrics & Gynecology

## 2014-01-17 VITALS — BP 113/57 | HR 91 | Temp 98.4°F | Wt 179.1 lb

## 2014-01-17 DIAGNOSIS — O24419 Gestational diabetes mellitus in pregnancy, unspecified control: Secondary | ICD-10-CM | POA: Insufficient documentation

## 2014-01-17 DIAGNOSIS — O99810 Abnormal glucose complicating pregnancy: Secondary | ICD-10-CM

## 2014-01-17 LAB — POCT URINALYSIS DIP (DEVICE)
GLUCOSE, UA: NEGATIVE mg/dL
Hgb urine dipstick: NEGATIVE
NITRITE: NEGATIVE
Protein, ur: 100 mg/dL — AB
Specific Gravity, Urine: >=1.03 (ref 1.005–1.030)
UROBILINOGEN UA: 0.2 mg/dL (ref 0.0–1.0)
pH: 5.5 (ref 5.0–8.0)

## 2014-01-17 MED ORDER — GLUCOSE BLOOD VI STRP: ORAL_STRIP | Status: DC

## 2014-01-17 NOTE — Progress Notes (Signed)
Weigh gain 25-35lb New ob and 28 week  Tdap received at [facility]

## 2014-01-17 NOTE — Progress Notes (Signed)
Received wrong strips from pharmacy, only checked <10 times with strips.  Fastings 105/133/78/78; 2 hr PP B 89/90/134 L 90/80;D 89. Has modified diet and resulted in better BS, will reevaluate next week when we have more values. Continue diet and exercise for now Reports bleeding, was seen in MAU for preterm contractions and given Procardia as needed. Reports spotting over the weekend; pelvic exam revealed no blood in vagina, closed cervix. No other complaints or concerns.  Fetal movement and labor precautions reviewed.

## 2014-01-17 NOTE — Patient Instructions (Signed)
Return to clinic for any obstetric concerns or go to MAU for evaluation  

## 2014-01-24 ENCOUNTER — Ambulatory Visit (INDEPENDENT_AMBULATORY_CARE_PROVIDER_SITE_OTHER): Payer: 59 | Admitting: Obstetrics & Gynecology

## 2014-01-24 VITALS — BP 107/60 | HR 93 | Temp 98.2°F | Wt 185.7 lb

## 2014-01-24 DIAGNOSIS — O3410 Maternal care for benign tumor of corpus uteri, unspecified trimester: Secondary | ICD-10-CM

## 2014-01-24 DIAGNOSIS — O99810 Abnormal glucose complicating pregnancy: Secondary | ICD-10-CM

## 2014-01-24 DIAGNOSIS — O24419 Gestational diabetes mellitus in pregnancy, unspecified control: Secondary | ICD-10-CM

## 2014-01-24 DIAGNOSIS — D259 Leiomyoma of uterus, unspecified: Secondary | ICD-10-CM

## 2014-01-24 DIAGNOSIS — O3413 Maternal care for benign tumor of corpus uteri, third trimester: Secondary | ICD-10-CM

## 2014-01-24 LAB — GLUCOSE, CAPILLARY: Glucose-Capillary: 96 mg/dL (ref 70–99)

## 2014-01-24 LAB — POCT URINALYSIS DIP (DEVICE)
Bilirubin Urine: NEGATIVE
Glucose, UA: NEGATIVE mg/dL
HGB URINE DIPSTICK: NEGATIVE
KETONES UR: NEGATIVE mg/dL
NITRITE: NEGATIVE
Protein, ur: NEGATIVE mg/dL
Specific Gravity, Urine: 1.02 (ref 1.005–1.030)
Urobilinogen, UA: 0.2 mg/dL (ref 0.0–1.0)
pH: 5.5 (ref 5.0–8.0)

## 2014-01-24 NOTE — Progress Notes (Signed)
Having problems with Medicaid covering covering the strips; will not pay for it until the 01/27/14. Reevaluate next week after she gets strips. Considering BTS,  Medicaid papers signed today.  No other complaints or concerns. Fetal movement and preterm labor precautions reviewed.

## 2014-01-24 NOTE — Progress Notes (Signed)
Reports upper abdominal pain. Reports intermittent contractions-- still taking procardia.  Patient reports she has not been able to check her sugars because she was told by the pharmacy medicaid will not cover strips until 01/28/14.

## 2014-01-24 NOTE — Patient Instructions (Signed)
Return to clinic for any obstetric concerns or go to MAU for evaluation Postpartum Tubal Ligation A postpartum tubal ligation (PPTL) is a procedure that blocks the fallopian tubes right after childbirth or 1-2 days after childbirth. PPTL is done before the uterus returns to its normal location. The procedure is also called a minilaparotomy. By blocking the fallopian tubes, the eggs that are released from the ovaries cannot enter the uterus and sperm cannot reach the egg. A PPTL is done so you will not be able to get pregnant or have a baby.  Although this procedure may be reversed, it should be considered permanent and irreversible. If you want to have future pregnancies, you should not have this procedure. LET YOUR CAREGIVER KNOW ABOUT:  Allergies to food or medicine.  Medicines taken, including vitamins, herbs, eyedrops, over-the-counter medicines, and creams.  Use of steroids (by mouth or creams).  Previous problems with numbing medicines.  History of bleeding problems or blood clots.  Any recent colds or infections.  Previous surgery.  Other health problems, including diabetes and kidney problems. RISKS AND COMPLICATIONS  Infection.  Bleeding.  Injury to other organs.  Anesthetic side effects.  Failure of the procedure.  Ectopic pregnancy.  Future regret about having the procedure done. BEFORE THE PROCEDURE   You may need to sign certain permission forms with your insurance up to 30 days before your due date.  After delivering your baby, you cannot eat or drink anything if the procedure is performed the same day. If you are having the procedure a day after delivering, you may be able to eat and drink until midnight. Your caregiver will give you specific directions depending on your situation. PROCEDURE   If done 1-2 days after delivery:  You will be given a medicine to make you sleep (general anesthetic) during the procedure.  A tube will be put down your throat to  help your breath while under general anesthesia.  A small cut (incision) is made just beneath the belly button.  The fallopian tubes are brought up through the incision.  The fallopian tubes are then sealed, tied, or cut.  If done after a caesarean delivery:  Tubal ligation is done through the incision already made (after the baby is delivered).  Once the tubes are blocked, the incision is closed with stitches (sutures). A bandage will be placed over the incisions. AFTER THE PROCEDURE  You may have some pain or cramps in the abdominal area for the next 3-7 days.  You will be given pain medicine to ease any discomfort.  You may also feel sick to your stomach if you were given a general anesthetic.  You may have some mild discomfort in the throat. This is from the tube that may have been placed in your throat while you were sleeping.  You may feel tired and should rest the remainder of the day. Document Released: 06/17/2005 Document Revised: 12/17/2011 Document Reviewed: 09/28/2011 [facility] Patient Information 2015 [place], [place]. This information is not intended to replace advice given to you by your health care provider. Make sure you discuss any questions you have with your health care provider.

## 2014-01-25 ENCOUNTER — Encounter: Payer: Self-pay | Admitting: *Deleted

## 2014-01-31 ENCOUNTER — Encounter: Payer: 59 | Admitting: Family Medicine

## 2014-02-17 ENCOUNTER — Encounter: Payer: 59 | Admitting: Family Medicine

## 2014-02-18 ENCOUNTER — Encounter (HOSPITAL_COMMUNITY): Payer: Self-pay | Admitting: *Deleted

## 2014-02-18 ENCOUNTER — Inpatient Hospital Stay (HOSPITAL_COMMUNITY)
Admission: AD | Admit: 2014-02-18 | Discharge: 2014-02-19 | Disposition: A | Discharge status: Home / Self Care | Payer: 59 | Source: Ambulatory Visit | Attending: Obstetrics & Gynecology | Admitting: Obstetrics & Gynecology

## 2014-02-18 DIAGNOSIS — O9989 Other specified diseases and conditions complicating pregnancy, childbirth and the puerperium: Principal | ICD-10-CM

## 2014-02-18 DIAGNOSIS — O99891 Other specified diseases and conditions complicating pregnancy: Secondary | ICD-10-CM | POA: Insufficient documentation

## 2014-02-18 DIAGNOSIS — M545 Low back pain, unspecified: Secondary | ICD-10-CM | POA: Insufficient documentation

## 2014-02-18 DIAGNOSIS — O24419 Gestational diabetes mellitus in pregnancy, unspecified control: Secondary | ICD-10-CM

## 2014-02-18 DIAGNOSIS — O26893 Other specified pregnancy related conditions, third trimester: Secondary | ICD-10-CM

## 2014-02-18 DIAGNOSIS — M533 Sacrococcygeal disorders, not elsewhere classified: Secondary | ICD-10-CM | POA: Insufficient documentation

## 2014-02-18 NOTE — MAU Note (Signed)
PT SAYS HER  TAILBONE HURTS - STARTED YESTERDAY--  NEVER HURT BEFORE.      SAYS UC STARTED   6PM.   DIARRHEA-  LOSE  AND WATERY.    GETS PNC-   DOWNSTAIRS.  VE AT 35 WEEKS -     CLOSED.    DENIES HSV AND MRSA.   GBS-   UNSURE

## 2014-02-19 DIAGNOSIS — M533 Sacrococcygeal disorders, not elsewhere classified: Secondary | ICD-10-CM | POA: Diagnosis not present

## 2014-02-19 DIAGNOSIS — M545 Low back pain, unspecified: Secondary | ICD-10-CM | POA: Diagnosis not present

## 2014-02-19 DIAGNOSIS — O99810 Abnormal glucose complicating pregnancy: Secondary | ICD-10-CM

## 2014-02-19 DIAGNOSIS — O99891 Other specified diseases and conditions complicating pregnancy: Secondary | ICD-10-CM | POA: Diagnosis present

## 2014-02-19 LAB — CULTURE, BETA STREP (GROUP B ONLY)

## 2014-02-19 LAB — OB RESULTS CONSOLE GBS: GBS: POSITIVE

## 2014-02-19 NOTE — MAU Provider Note (Signed)
History     CSN: [id number]  Arrival date and time: 02/18/14 2139   None     No chief complaint on file.  HPI Sonia Allen is a 28yo [id number] @ 37.3wks who presents c/o 'tailbone' pain. Denies leaking, bldg, dysuria, or reg ctx. Also with occ diarrhea x 1-2wks. No N/V/D. Her kids have been ill recently. Her preg has been followed by the [facility] and has been remarkable for 1) late/limited care with visits at 31-33wks 2) A1GDM- not diligent with taking CBGs  OB History   Grav Para Term Preterm Abortions TAB SAB Ect Mult Living   5 3 3  0 1 1 0 0 0 3      Past Medical History  Diagnosis Date  . Gonorrhea   . Chlamydia   . Arrhythmia   . Hx of migraine headaches   . Headache(784.0)   . Anemia     first and sec pregnancies  . Fibroid   . Leiomyoma of uterus in pregnancy   . Hemorrhoids   . Gestational diabetes     Past Surgical History  Procedure Laterality Date  . Wisdom tooth extraction    . Induced abortion  March  2 ,2013    Family History  Problem Relation Age of Onset  . Other Neg Hx   . Alcohol abuse Neg Hx   . Arthritis Neg Hx   . Asthma Neg Hx   . Birth defects Neg Hx   . Cancer Neg Hx   . COPD Neg Hx   . Depression Neg Hx   . Diabetes Neg Hx   . Drug abuse Neg Hx   . Early death Neg Hx   . Heart disease Neg Hx   . Hearing loss Neg Hx   . Hyperlipidemia Neg Hx   . Hypertension Neg Hx   . Kidney disease Neg Hx   . Learning disabilities Neg Hx   . Mental illness Neg Hx   . Mental retardation Neg Hx   . Miscarriages / Stillbirths Neg Hx   . Stroke Neg Hx   . Vision loss Neg Hx     History  Substance Use Topics  . Smoking status: Never Smoker   . Smokeless tobacco: Never Used  . Alcohol Use: No     Comment: weekly- 1/2 bottle liquor    Allergies: No Known Allergies  Prescriptions prior to admission  Medication Sig Dispense Refill  . ACCU-CHEK FASTCLIX LANCETS MISC Inject 1 each into the skin 4 (four) times daily. 648.83 for testing 4 times daily   102 each  12  . glucose blood (ACCU-CHEK SMARTVIEW) test strip Use as instructed to check blood sugars  100 each  12  . Prenatal Vit-Fe Fumarate-FA (PRENATAL MULTIVITAMIN) TABS Take 1 tablet by mouth daily at 12 noon.      Marland Kitchen PRESCRIPTION MEDICATION "MUSCLE RELAXER" PT DOESN'T KNOW WHICH ONE      . NIFEdipine (PROCARDIA-XL/ADALAT CC) 30 MG 24 hr tablet Take 1 tablet (30 mg total) by mouth 2 (two) times daily as needed.  60 tablet  0    ROS Physical Exam   Blood pressure 118/64, pulse 96, temperature 98.8 F (37.1 C), temperature source Oral, resp. rate 20, height 5\' 2"  (1.575 m), weight 86.297 kg (190 lb 4 oz), last menstrual period 06/02/2013, unknown if currently breastfeeding.  Physical Exam  Constitutional: She is oriented to person, place, and time. She appears well-developed.  HENT:  Head: Normocephalic.  Neck: Normal range of motion.  Cardiovascular: Normal rate.   Respiratory: Effort normal.  GI:  EFM 155-160, +accels, no decels, occ mi variable Irreg, mild ctx  Musculoskeletal: Normal range of motion.  Neurological: She is alert and oriented to person, place, and time.  Skin: Skin is warm and dry.  Psychiatric: She has a normal mood and affect. Her behavior is normal. Thought content normal.    MAU Course  Procedures   Assessment and Plan  IUP @ 37.3wks Low back/tailbone pain  D/C home with comfort tips GBS collected Strongly encouraged to stay on GDM diet and check sugars regularly F/U as scheduled on 03/03/14 (pt says this is 'first available' when she spoke with clinic last week)  Serita Grammes CNM 02/19/2014, 12:46 AM

## 2014-02-19 NOTE — Discharge Instructions (Signed)

## 2014-03-01 ENCOUNTER — Ambulatory Visit (INDEPENDENT_AMBULATORY_CARE_PROVIDER_SITE_OTHER): Payer: 59 | Admitting: Obstetrics & Gynecology

## 2014-03-01 ENCOUNTER — Encounter: Payer: Self-pay | Admitting: Obstetrics & Gynecology

## 2014-03-01 VITALS — BP 120/70 | HR 121 | Temp 98.4°F | Wt 189.2 lb

## 2014-03-01 DIAGNOSIS — O24419 Gestational diabetes mellitus in pregnancy, unspecified control: Secondary | ICD-10-CM

## 2014-03-01 DIAGNOSIS — D259 Leiomyoma of uterus, unspecified: Secondary | ICD-10-CM

## 2014-03-01 DIAGNOSIS — O3410 Maternal care for benign tumor of corpus uteri, unspecified trimester: Secondary | ICD-10-CM

## 2014-03-01 DIAGNOSIS — O99810 Abnormal glucose complicating pregnancy: Secondary | ICD-10-CM

## 2014-03-01 LAB — POCT URINALYSIS DIP (DEVICE)
Glucose, UA: NEGATIVE mg/dL
HGB URINE DIPSTICK: NEGATIVE
LEUKOCYTES UA: NEGATIVE
Nitrite: NEGATIVE
Protein, ur: 100 mg/dL — AB
Specific Gravity, Urine: >=1.03 (ref 1.005–1.030)
Urobilinogen, UA: 1 mg/dL (ref 0.0–1.0)
pH: 5.5 (ref 5.0–8.0)

## 2014-03-01 LAB — OB RESULTS CONSOLE GC/CHLAMYDIA
Chlamydia: NEGATIVE
Gonorrhea: NEGATIVE

## 2014-03-01 NOTE — Addendum Note (Signed)
Addended by: Samuel Germany on: 03/01/2014 04:31 PM   Modules accepted: Orders

## 2014-03-01 NOTE — Progress Notes (Signed)
Routine visit. Good FM. No problems. Cervical cultures obtained today. UDS today. She has been absent from care for the last 5 weeks, not checking her sugars. I will get a hemoglobin A1C today. Labor precautions reveiwed.

## 2014-03-01 NOTE — Addendum Note (Signed)
Addended by: Novella Olive on: 03/01/2014 04:32 PM   Modules accepted: Orders

## 2014-03-02 LAB — HEMOGLOBIN A1C
Hgb A1c MFr Bld: 6.2 % — ABNORMAL HIGH (ref <5.7)
Mean Plasma Glucose: 131 mg/dL — ABNORMAL HIGH (ref <117)

## 2014-03-03 LAB — PRESCRIPTION MONITORING PROFILE (19 PANEL)
Amphetamine/Meth: NEGATIVE ng/mL
Barbiturate Screen, Urine: NEGATIVE ng/mL
Benzodiazepine Screen, Urine: NEGATIVE ng/mL
Buprenorphine, Urine: NEGATIVE ng/mL
CANNABINOID SCRN UR: NEGATIVE ng/mL
CARISOPRODOL, URINE: NEGATIVE ng/mL
COCAINE METABOLITES: NEGATIVE ng/mL
CREATININE, URINE: 575.48 mg/dL (ref >20.0)
FENTANYL URINE: NEGATIVE ng/mL
MDMA URINE: NEGATIVE ng/mL
METHADONE SCREEN, URINE: NEGATIVE ng/mL
Meperidine, Ur: NEGATIVE ng/mL
Methaqualone: NEGATIVE ng/mL
Nitrites, Initial: NEGATIVE ug/mL
Opiate Screen, Urine: NEGATIVE ng/mL
Oxycodone Screen, Ur: NEGATIVE ng/mL
PHENCYCLIDINE, UR: NEGATIVE ng/mL
Propoxyphene: NEGATIVE ng/mL
Tapentadol, urine: NEGATIVE ng/mL
Tramadol Scrn, Ur: NEGATIVE ng/mL
Zolpidem, Urine: NEGATIVE ng/mL
pH, Initial: 5.7 pH (ref 4.5–8.9)

## 2014-03-04 LAB — GC/CHLAMYDIA PROBE AMP
CT PROBE, AMP APTIMA: NEGATIVE
GC PROBE AMP APTIMA: NEGATIVE

## 2014-03-06 ENCOUNTER — Encounter (HOSPITAL_COMMUNITY): Payer: Self-pay | Admitting: *Deleted

## 2014-03-06 ENCOUNTER — Inpatient Hospital Stay (HOSPITAL_COMMUNITY)
Admission: AD | Admit: 2014-03-06 | Discharge: 2014-03-06 | Disposition: A | Discharge status: Home / Self Care | Payer: 59 | Source: Ambulatory Visit | Attending: Family Medicine | Admitting: Family Medicine

## 2014-03-06 DIAGNOSIS — O9989 Other specified diseases and conditions complicating pregnancy, childbirth and the puerperium: Principal | ICD-10-CM

## 2014-03-06 DIAGNOSIS — O99891 Other specified diseases and conditions complicating pregnancy: Secondary | ICD-10-CM | POA: Insufficient documentation

## 2014-03-06 NOTE — MAU Note (Signed)
Pt states she had yellow discharge at 2300 tonight. Denies pain, LOF and VB, possibly decreased fetal movement.

## 2014-03-06 NOTE — MAU Note (Signed)
When i wiped earlier i had some yellow mucous d/c. No pain now.

## 2014-03-06 NOTE — MAU Note (Signed)
Baby very active 

## 2014-03-07 ENCOUNTER — Encounter (HOSPITAL_COMMUNITY): Payer: Self-pay | Admitting: *Deleted

## 2014-03-07 ENCOUNTER — Inpatient Hospital Stay (HOSPITAL_COMMUNITY): Payer: 59

## 2014-03-07 ENCOUNTER — Inpatient Hospital Stay (HOSPITAL_COMMUNITY)
Admission: AD | Admit: 2014-03-07 | Discharge: 2014-03-07 | Disposition: A | Discharge status: Home / Self Care | Payer: 59 | Source: Ambulatory Visit | Attending: Family Medicine | Admitting: Family Medicine

## 2014-03-07 DIAGNOSIS — O99891 Other specified diseases and conditions complicating pregnancy: Secondary | ICD-10-CM | POA: Diagnosis not present

## 2014-03-07 DIAGNOSIS — J029 Acute pharyngitis, unspecified: Secondary | ICD-10-CM | POA: Diagnosis present

## 2014-03-07 DIAGNOSIS — O99513 Diseases of the respiratory system complicating pregnancy, third trimester: Secondary | ICD-10-CM

## 2014-03-07 DIAGNOSIS — R000 Tachycardia, unspecified: Secondary | ICD-10-CM | POA: Insufficient documentation

## 2014-03-07 DIAGNOSIS — O24419 Gestational diabetes mellitus in pregnancy, unspecified control: Secondary | ICD-10-CM

## 2014-03-07 DIAGNOSIS — J189 Pneumonia, unspecified organism: Secondary | ICD-10-CM | POA: Insufficient documentation

## 2014-03-07 DIAGNOSIS — O9989 Other specified diseases and conditions complicating pregnancy, childbirth and the puerperium: Principal | ICD-10-CM

## 2014-03-07 LAB — CBC WITH DIFFERENTIAL/PLATELET
Basophils Absolute: 0 10*3/uL (ref 0.0–0.1)
Basophils Relative: 0 % (ref 0–1)
Eosinophils Absolute: 0.1 10*3/uL (ref 0.0–0.7)
Eosinophils Relative: 1 % (ref 0–5)
HEMATOCRIT: 30.1 % — AB (ref 36.0–46.0)
Hemoglobin: 10.1 g/dL — ABNORMAL LOW (ref 12.0–15.0)
LYMPHS ABS: 1.3 10*3/uL (ref 0.7–4.0)
LYMPHS PCT: 12 % (ref 12–46)
MCH: 27.6 pg (ref 26.0–34.0)
MCHC: 33.6 g/dL (ref 30.0–36.0)
MCV: 82.2 fL (ref 78.0–100.0)
MONO ABS: 0.5 10*3/uL (ref 0.1–1.0)
Monocytes Relative: 4 % (ref 3–12)
Neutro Abs: 9.2 10*3/uL — ABNORMAL HIGH (ref 1.7–7.7)
Neutrophils Relative %: 83 % — ABNORMAL HIGH (ref 43–77)
Platelets: 239 10*3/uL (ref 150–400)
RBC: 3.66 MIL/uL — AB (ref 3.87–5.11)
RDW: 15.6 % — AB (ref 11.5–15.5)
WBC: 11 10*3/uL — AB (ref 4.0–10.5)

## 2014-03-07 MED ORDER — AZITHROMYCIN 250 MG PO TABS
500.0000 mg | ORAL_TABLET | Freq: Once | ORAL | Status: AC
  Administered 2014-03-07: 500 mg via ORAL
  Filled 2014-03-07: qty 2

## 2014-03-07 MED ORDER — SODIUM CHLORIDE 0.9 % IV BOLUS (SEPSIS)
1000.0000 mL | Freq: Once | INTRAVENOUS | Status: AC
  Administered 2014-03-07: 1000 mL via INTRAVENOUS

## 2014-03-07 MED ORDER — AZITHROMYCIN 250 MG PO TABS: 250.0000 mg | ORAL_TABLET | Freq: Every day | ORAL | Status: DC

## 2014-03-07 MED ORDER — DEXTROSE 5 % IV SOLN
1.0000 g | Freq: Once | INTRAVENOUS | Status: AC
  Administered 2014-03-07: 1 g via INTRAVENOUS
  Filled 2014-03-07: qty 10

## 2014-03-07 NOTE — MAU Provider Note (Cosign Needed)
First Provider Initiated Contact with Patient 03/07/14 1534      Chief Complaint:  Cough and Sore Throat   Sonia Allen is  28 y.o. [id number] at [redacted]w[redacted]d presents complaining of Cough and Sore Throat She started coughing on 9/6 after she was discharged from the MAU. Last night she was coughing so hard she threw up and was breathing "real hard."  Yellow post-tussive emesis x 5 episodes last night. Able to keep Ginger Ale down.  Nasal rhinorrhea started today along with fatigue and generalized weakness. Subjectively hot but no fevers/chills. No sick contacts. Has not tried anything to resolve these symptoms. Nothing makes her symptoms better or worse.   Unsure whether she's having contractions. No LOF or vaginal bleeding. Good fetal movement.    Obstetrical/Gynecological History: OB History   Grav Para Term Preterm Abortions TAB SAB Ect Mult Living   5 3 3  0 1 1 0 0 0 3     Past Medical History: Past Medical History  Diagnosis Date  . Gonorrhea   . Chlamydia   . Arrhythmia   . Hx of migraine headaches   . Headache(784.0)   . Anemia     first and sec pregnancies  . Fibroid   . Leiomyoma of uterus in pregnancy   . Hemorrhoids   . Gestational diabetes     Past Surgical History: Past Surgical History  Procedure Laterality Date  . Wisdom tooth extraction    . Induced abortion  March  2 ,2013    Family History: Family History  Problem Relation Age of Onset  . Other Neg Hx   . Alcohol abuse Neg Hx   . Arthritis Neg Hx   . Asthma Neg Hx   . Birth defects Neg Hx   . Cancer Neg Hx   . COPD Neg Hx   . Depression Neg Hx   . Diabetes Neg Hx   . Drug abuse Neg Hx   . Early death Neg Hx   . Heart disease Neg Hx   . Hearing loss Neg Hx   . Hyperlipidemia Neg Hx   . Hypertension Neg Hx   . Kidney disease Neg Hx   . Learning disabilities Neg Hx   . Mental illness Neg Hx   . Mental retardation Neg Hx   . Miscarriages / Stillbirths Neg Hx   . Stroke Neg Hx   . Vision loss Neg  Hx     Social History: History  Substance Use Topics  . Smoking status: Never Smoker   . Smokeless tobacco: Never Used  . Alcohol Use: No     Comment: weekly- 1/2 bottle liquor    Allergies: No Known Allergies  Meds:  Prescriptions prior to admission  Medication Sig Dispense Refill  . NIFEdipine (PROCARDIA-XL/ADALAT-CC/NIFEDICAL-XL) 30 MG 24 hr tablet Take 30 mg by mouth daily as needed (for contractions.).      Marland Kitchen Prenatal Vit-Fe Fumarate-FA (PRENATAL MULTIVITAMIN) TABS Take 1 tablet by mouth daily.       Marland Kitchen ACCU-CHEK FASTCLIX LANCETS MISC Inject 1 each into the skin 4 (four) times daily. 648.83 for testing 4 times daily  102 each  12  . glucose blood (ACCU-CHEK SMARTVIEW) test strip Use as instructed to check blood sugars  100 each  12    Review of Systems -   Review of Systems  Constitutional: Negative for fever, chills, weight loss, and diaphoresis.  HENT: Negative for hearing loss, ear pain, nosebleeds, congestion, sore throat, neck pain, tinnitus and ear  discharge.   Eyes: Negative for blurred vision, double vision, photophobia, pain, discharge and redness.  Respiratory: Negative for hemoptysis, sputum production, wheezing and stridor.   Cardiovascular: Negative for chest pain, palpitations, orthopnea,  leg swelling  Gastrointestinal: Negative for abdominal pain heartburn, nausea, diarrhea, constipation, blood in stool Genitourinary: Negative for dysuria, urgency, frequency, hematuria and flank pain.  Musculoskeletal: Negative for myalgias, back pain, joint pain and falls.  Skin: Negative for itching and rash.  Neurological: Negative for dizziness, tingling, tremors, sensory change, speech change, focal weakness, seizures, loss of consciousness, weakness and headaches.  Endo/Heme/Allergies: Negative for environmental allergies and polydipsia. Does not bruise/bleed easily.  Psychiatric/Behavioral: Negative for depression, suicidal ideas, hallucinations, memory loss and  substance abuse. The patient is not nervous/anxious and does not have insomnia.      Physical Exam  Blood pressure 121/83, pulse 118, temperature 99.7 F (37.6 C), temperature source Oral, resp. rate 22, last menstrual period 06/02/2013, SpO2 99.00%, unknown if currently breastfeeding. GENERAL: Well-developed, well-nourished female who appears tired  LUNGS: Crackles noted in the left lung. No wheezing or rhonchi noted. A HEART: Regular rate and rhythm. ABDOMEN: Soft, nontender, nondistended, gravid.  EXTREMITIES: Nontender, no edema, 2+ distal pulses. DTR's 2+ FHT:  Baseline rate 150 bpm   Variability moderate  Accelerations present   Decelerations none Contractions: irregular   Labs: Results for orders placed during the hospital encounter of 03/07/14 (from the past 24 hour(s))  CBC WITH DIFFERENTIAL   Collection Time    03/07/14  5:15 PM      Result Value Ref Range   WBC 11.0 (*) 4.0 - 10.5 K/uL   RBC 3.66 (*) 3.87 - 5.11 MIL/uL   Hemoglobin 10.1 (*) 12.0 - 15.0 g/dL   HCT 30.1 (*) 36.0 - 46.0 %   MCV 82.2  78.0 - 100.0 fL   MCH 27.6  26.0 - 34.0 pg   MCHC 33.6  30.0 - 36.0 g/dL   RDW 15.6 (*) 11.5 - 15.5 %   Platelets 239  150 - 400 K/uL   Neutrophils Relative % 83 (*) 43 - 77 %   Neutro Abs 9.2 (*) 1.7 - 7.7 K/uL   Lymphocytes Relative 12  12 - 46 %   Lymphs Abs 1.3  0.7 - 4.0 K/uL   Monocytes Relative 4  3 - 12 %   Monocytes Absolute 0.5  0.1 - 1.0 K/uL   Eosinophils Relative 1  0 - 5 %   Eosinophils Absolute 0.1  0.0 - 0.7 K/uL   Basophils Relative 0  0 - 1 %   Basophils Absolute 0.0  0.0 - 0.1 K/uL   Imaging Studies:  CXR: Lungs are hypoinflated with airspace consolidation over the posterior left lower lobe likely pneumonia. Cardiomediastinal silhouette and remainder the exam is unchanged.  Assessment: [name] is  28 y.o. [phone] at [redacted]w[redacted]d presents with cough and sore throat since yesterday. She was tachycardic on exam, anywhere from 100-120s. Slightly  tachypneic with crackles noted on the left. CXR showing a left lower lobe pneumonia consistent with HPI and physical exam findings. Will treat for community acquired pneumonia.   Plan: - Rocephin 1gm IV in MAU - Azithromycin 500mg  PO given in MAU - Rx for azithromycin 250mg  x 4 days written - Return precautions given  Archie Patten 9/7/20156:08 PM  OB fellow attestation:  I have seen and examined this patient; I agree with above documentation in the resident's note.   Oluwadarasimi Favor is a 28 y.o.  [id number] reporting chills, productive cough, mild shortness of breath.  +FM, denies LOF, VB, contractions, vaginal discharge.  PE: BP 119/84  Pulse 106  Temp(Src) 99.7 F (37.6 C) (Oral)  Resp 20  SpO2 99%  LMP 06/02/2013 Gen: calm comfortable, NAD CV: tachycardic Resp: mildly increased respiratory effort, mildly tachypneic, crackles heard in left lung base Abd: gravid  ROS, labs, PMH reviewed NST reactive   Plan: 1. Left Lower Lobe Pneumonia. Status post 1L NS bolus with significant improvement in tachycardia. Patient states she feels much better and ready to go home.  - Status post ceftriaxone 1 gm IV, Azithromycin 500 mg PO once in MAU.  - Rx azithromycin 250 mg x 4 more days.  - Recommend rest, plenty of fluids.  - Message clinic to see if appointment can be scheduled sooner.  - Reviewed strict precautions for seeking medical care. Reviewed labor precautions.   Shelbie Hutching, MD 7:09 PM

## 2014-03-07 NOTE — MAU Note (Signed)
Urine in lab 

## 2014-03-07 NOTE — MAU Note (Signed)
Patient states she developed a cough last night that made her throw up. Has a slight sore throat but just does not feel well. Denies bleeding or leaking. States she is having some contractions but does not know if the baby is moving.

## 2014-03-07 NOTE — Discharge Instructions (Signed)

## 2014-03-11 ENCOUNTER — Ambulatory Visit (INDEPENDENT_AMBULATORY_CARE_PROVIDER_SITE_OTHER): Payer: 59 | Admitting: Family Medicine

## 2014-03-11 ENCOUNTER — Telehealth (HOSPITAL_COMMUNITY): Payer: Self-pay | Admitting: *Deleted

## 2014-03-11 ENCOUNTER — Encounter (HOSPITAL_COMMUNITY): Payer: Self-pay | Admitting: *Deleted

## 2014-03-11 VITALS — BP 134/58 | HR 83 | Temp 98.2°F | Wt 186.6 lb

## 2014-03-11 DIAGNOSIS — O3410 Maternal care for benign tumor of corpus uteri, unspecified trimester: Secondary | ICD-10-CM

## 2014-03-11 DIAGNOSIS — O99810 Abnormal glucose complicating pregnancy: Secondary | ICD-10-CM

## 2014-03-11 DIAGNOSIS — O480 Post-term pregnancy: Secondary | ICD-10-CM

## 2014-03-11 DIAGNOSIS — D259 Leiomyoma of uterus, unspecified: Secondary | ICD-10-CM

## 2014-03-11 DIAGNOSIS — Z23 Encounter for immunization: Secondary | ICD-10-CM

## 2014-03-11 LAB — POCT URINALYSIS DIP (DEVICE)
Glucose, UA: NEGATIVE mg/dL
HGB URINE DIPSTICK: NEGATIVE
Nitrite: NEGATIVE
PROTEIN: 30 mg/dL — AB
Specific Gravity, Urine: >=1.03 (ref 1.005–1.030)
Urobilinogen, UA: 0.2 mg/dL (ref 0.0–1.0)
pH: 5.5 (ref 5.0–8.0)

## 2014-03-11 MED ORDER — TETANUS-DIPHTH-ACELL PERTUSSIS 5-2.5-18.5 LF-MCG/0.5 IM SUSP: 0.5000 mL | Freq: Once | INTRAMUSCULAR | Status: DC

## 2014-03-11 NOTE — Patient Instructions (Signed)
Labor Induction  Labor induction is when steps are taken to cause a pregnant woman to begin the labor process. Most women go into labor on their own between 37 weeks and 42 weeks of the pregnancy. When this does not happen or when there is a medical need, methods may be used to induce labor. Labor induction causes a pregnant woman's uterus to contract. It also causes the cervix to soften (ripen), open (dilate), and thin out (efface). Usually, labor is not induced before 39 weeks of the pregnancy unless there is a problem with the baby or mother.  Before inducing labor, your health care provider will consider a number of factors, including the following:  The medical condition of you and the baby.   How many weeks along you are.   The status of the baby's lung maturity.   The condition of the cervix.   The position of the baby.  WHAT ARE THE REASONS FOR LABOR INDUCTION? Labor may be induced for the following reasons:  The health of the baby or mother is at risk.   The pregnancy is overdue by 1 week or more.   The water breaks but labor does not start on its own.   The mother has a health condition or serious illness, such as high blood pressure, infection, placental abruption, or diabetes.  The amniotic fluid amounts are low around the baby.   The baby is distressed.  Convenience or wanting the baby to be born on a certain date is not a reason for inducing labor. WHAT METHODS ARE USED FOR LABOR INDUCTION? Several methods of labor induction may be used, such as:   Prostaglandin medicine. This medicine causes the cervix to dilate and ripen. The medicine will also start contractions. It can be taken by mouth or by inserting a suppository into the vagina.   Inserting a thin tube (catheter) with a balloon on the end into the vagina to dilate the cervix. Once inserted, the balloon is expanded with water, which causes the cervix to open.   Stripping the membranes. Your health  care provider separates amniotic sac tissue from the cervix, causing the cervix to be stretched and causing the release of a hormone called progesterone. This may cause the uterus to contract. It is often done during an office visit. You will be sent home to wait for the contractions to begin. You will then come in for an induction.   Breaking the water. Your health care provider makes a hole in the amniotic sac using a small instrument. Once the amniotic sac breaks, contractions should begin. This may still take hours to see an effect.   Medicine to trigger or strengthen contractions. This medicine is given through an IV access tube inserted into a vein in your arm.  All of the methods of induction, besides stripping the membranes, will be done in the hospital. Induction is done in the hospital so that you and the baby can be carefully monitored.  HOW LONG DOES IT TAKE FOR LABOR TO BE INDUCED? Some inductions can take up to 2-3 days. Depending on the cervix, it usually takes less time. It takes longer when you are induced early in the pregnancy or if this is your first pregnancy. If a mother is still pregnant and the induction has been going on for 2-3 days, either the mother will be sent home or a cesarean delivery will be needed. WHAT ARE THE RISKS ASSOCIATED WITH LABOR INDUCTION? Some of the risks of induction   include:   Changes in fetal heart rate, such as too high, too low, or erratic.   Fetal distress.   Chance of infection for the mother and baby.   Increased chance of having a cesarean delivery.   Breaking off (abruption) of the placenta from the uterus (rare).   Uterine rupture (very rare).  When induction is needed for medical reasons, the benefits of induction may outweigh the risks. WHAT ARE SOME REASONS FOR NOT INDUCING LABOR? Labor induction should not be done if:   It is shown that your baby does not tolerate labor.   You have had previous surgeries on your  uterus, such as a myomectomy or the removal of fibroids.   Your placenta lies very low in the uterus and blocks the opening of the cervix (placenta previa).   Your baby is not in a head-down position.   The umbilical cord drops down into the birth canal in front of the baby. This could cut off the baby's blood and oxygen supply.   You have had a previous cesarean delivery.   There are unusual circumstances, such as the baby being extremely premature.  Document Released: 11/06/2006 Document Revised: 02/17/2013 Document Reviewed: 01/14/2013 [employer] Patient Information 2015 [employer]. This information is not intended to replace advice given to you by your health care provider. Make sure you discuss any questions you have with your health care provider.  

## 2014-03-11 NOTE — Progress Notes (Signed)
Category 1 tracing with baseline in 145s.  Moderate variability, multiple accelerations, no decelerations.  Ctxs every 8 minutes

## 2014-03-11 NOTE — Telephone Encounter (Signed)
Preadmission screen  

## 2014-03-11 NOTE — Progress Notes (Signed)
IOL scheduled tomorrow @ 6067

## 2014-03-12 ENCOUNTER — Encounter (HOSPITAL_COMMUNITY): Payer: 59 | Admitting: Anesthesiology

## 2014-03-12 ENCOUNTER — Inpatient Hospital Stay (HOSPITAL_COMMUNITY)
Admission: RE | Admit: 2014-03-12 | Discharge: 2014-03-14 | DRG: 775 | Disposition: A | Discharge status: Home / Self Care | Payer: 59 | Source: Ambulatory Visit | Attending: Obstetrics & Gynecology | Admitting: Obstetrics & Gynecology

## 2014-03-12 ENCOUNTER — Inpatient Hospital Stay (HOSPITAL_COMMUNITY): Payer: 59 | Admitting: Anesthesiology

## 2014-03-12 ENCOUNTER — Encounter (HOSPITAL_COMMUNITY): Payer: Self-pay

## 2014-03-12 VITALS — BP 116/85 | HR 92 | Temp 98.3°F | Resp 20 | Ht 62.0 in | Wt 189.0 lb

## 2014-03-12 DIAGNOSIS — IMO0002 Reserved for concepts with insufficient information to code with codable children: Secondary | ICD-10-CM | POA: Diagnosis present

## 2014-03-12 DIAGNOSIS — O480 Post-term pregnancy: Secondary | ICD-10-CM

## 2014-03-12 DIAGNOSIS — O3410 Maternal care for benign tumor of corpus uteri, unspecified trimester: Secondary | ICD-10-CM

## 2014-03-12 DIAGNOSIS — Z22330 Carrier of Group B streptococcus: Secondary | ICD-10-CM

## 2014-03-12 DIAGNOSIS — O34599 Maternal care for other abnormalities of gravid uterus, unspecified trimester: Secondary | ICD-10-CM | POA: Diagnosis present

## 2014-03-12 DIAGNOSIS — O99814 Abnormal glucose complicating childbirth: Secondary | ICD-10-CM | POA: Diagnosis present

## 2014-03-12 DIAGNOSIS — O99810 Abnormal glucose complicating pregnancy: Secondary | ICD-10-CM | POA: Diagnosis present

## 2014-03-12 DIAGNOSIS — O9989 Other specified diseases and conditions complicating pregnancy, childbirth and the puerperium: Secondary | ICD-10-CM

## 2014-03-12 DIAGNOSIS — O24419 Gestational diabetes mellitus in pregnancy, unspecified control: Secondary | ICD-10-CM | POA: Diagnosis present

## 2014-03-12 DIAGNOSIS — O99892 Other specified diseases and conditions complicating childbirth: Secondary | ICD-10-CM | POA: Diagnosis present

## 2014-03-12 DIAGNOSIS — D259 Leiomyoma of uterus, unspecified: Secondary | ICD-10-CM | POA: Diagnosis present

## 2014-03-12 DIAGNOSIS — Z3490 Encounter for supervision of normal pregnancy, unspecified, unspecified trimester: Secondary | ICD-10-CM

## 2014-03-12 LAB — COMPREHENSIVE METABOLIC PANEL
ALBUMIN: 2.8 g/dL — AB (ref 3.5–5.2)
ALT: 10 U/L (ref 0–35)
ANION GAP: 14 (ref 5–15)
AST: 15 U/L (ref 0–37)
Alkaline Phosphatase: 116 U/L (ref 39–117)
BILIRUBIN TOTAL: 0.3 mg/dL (ref 0.3–1.2)
BUN: 9 mg/dL (ref 6–23)
CO2: 20 mEq/L (ref 19–32)
Calcium: 9 mg/dL (ref 8.4–10.5)
Chloride: 103 mEq/L (ref 96–112)
Creatinine, Ser: 0.57 mg/dL (ref 0.50–1.10)
GFR calc Af Amer: >90 mL/min (ref >90)
GFR calc non Af Amer: >90 mL/min (ref >90)
Glucose, Bld: 83 mg/dL (ref 70–99)
Potassium: 4.3 mEq/L (ref 3.7–5.3)
Sodium: 137 mEq/L (ref 137–147)
Total Protein: 6.1 g/dL (ref 6.0–8.3)

## 2014-03-12 LAB — CBC
HCT: 32.4 % — ABNORMAL LOW (ref 36.0–46.0)
HEMOGLOBIN: 10.9 g/dL — AB (ref 12.0–15.0)
MCH: 27.6 pg (ref 26.0–34.0)
MCHC: 33.6 g/dL (ref 30.0–36.0)
MCV: 82 fL (ref 78.0–100.0)
PLATELETS: 295 10*3/uL (ref 150–400)
RBC: 3.95 MIL/uL (ref 3.87–5.11)
RDW: 15.7 % — ABNORMAL HIGH (ref 11.5–15.5)
WBC: 8 10*3/uL (ref 4.0–10.5)

## 2014-03-12 LAB — PROTEIN / CREATININE RATIO, URINE
CREATININE, URINE: 215.82 mg/dL
PROTEIN CREATININE RATIO: 0.15 (ref 0.00–0.15)
TOTAL PROTEIN, URINE: 32.7 mg/dL

## 2014-03-12 LAB — GLUCOSE, CAPILLARY: Glucose-Capillary: 100 mg/dL — ABNORMAL HIGH (ref 70–99)

## 2014-03-12 LAB — RPR

## 2014-03-12 MED ORDER — ONDANSETRON HCL 4 MG PO TABS: 4.0000 mg | ORAL_TABLET | ORAL | Status: DC | PRN

## 2014-03-12 MED ORDER — LIDOCAINE HCL (PF) 1 % IJ SOLN
INTRAMUSCULAR | Status: DC | PRN
  Administered 2014-03-12: 10 mL

## 2014-03-12 MED ORDER — EPHEDRINE 5 MG/ML INJ
10.0000 mg | INTRAVENOUS | Status: DC | PRN
  Filled 2014-03-12: qty 2

## 2014-03-12 MED ORDER — FENTANYL 2.5 MCG/ML BUPIVACAINE 1/10 % EPIDURAL INFUSION (WH - ANES)
INTRAMUSCULAR | Status: AC
  Filled 2014-03-12: qty 125

## 2014-03-12 MED ORDER — WITCH HAZEL-GLYCERIN EX PADS: 1.0000 "application " | MEDICATED_PAD | CUTANEOUS | Status: DC | PRN

## 2014-03-12 MED ORDER — PHENYLEPHRINE 40 MCG/ML (10ML) SYRINGE FOR IV PUSH (FOR BLOOD PRESSURE SUPPORT)
80.0000 ug | PREFILLED_SYRINGE | INTRAVENOUS | Status: DC | PRN
  Filled 2014-03-12: qty 2

## 2014-03-12 MED ORDER — BENZOCAINE-MENTHOL 20-0.5 % EX AERO: 1.0000 "application " | INHALATION_SPRAY | CUTANEOUS | Status: DC | PRN

## 2014-03-12 MED ORDER — FLEET ENEMA 7-19 GM/118ML RE ENEM: 1.0000 | ENEMA | RECTAL | Status: DC | PRN

## 2014-03-12 MED ORDER — LANOLIN HYDROUS EX OINT: TOPICAL_OINTMENT | CUTANEOUS | Status: DC | PRN

## 2014-03-12 MED ORDER — SIMETHICONE 80 MG PO CHEW: 80.0000 mg | CHEWABLE_TABLET | ORAL | Status: DC | PRN

## 2014-03-12 MED ORDER — OXYTOCIN 40 UNITS IN LACTATED RINGERS INFUSION - SIMPLE MED
62.5000 mL/h | INTRAVENOUS | Status: DC
  Administered 2014-03-12: 62.5 mL/h via INTRAVENOUS

## 2014-03-12 MED ORDER — CITRIC ACID-SODIUM CITRATE 334-500 MG/5ML PO SOLN: 30.0000 mL | ORAL | Status: DC | PRN

## 2014-03-12 MED ORDER — OXYTOCIN BOLUS FROM INFUSION
500.0000 mL | INTRAVENOUS | Status: DC
  Administered 2014-03-12: 500 mL via INTRAVENOUS

## 2014-03-12 MED ORDER — OXYTOCIN 40 UNITS IN LACTATED RINGERS INFUSION - SIMPLE MED
1.0000 m[IU]/min | INTRAVENOUS | Status: DC
Start: 2014-03-12 — End: 2014-03-12
  Administered 2014-03-12: 2 m[IU]/min via INTRAVENOUS
  Filled 2014-03-12: qty 1000

## 2014-03-12 MED ORDER — OXYCODONE-ACETAMINOPHEN 5-325 MG PO TABS: 2.0000 | ORAL_TABLET | ORAL | Status: DC | PRN

## 2014-03-12 MED ORDER — ONDANSETRON HCL 4 MG/2ML IJ SOLN: 4.0000 mg | INTRAMUSCULAR | Status: DC | PRN

## 2014-03-12 MED ORDER — PENICILLIN G POTASSIUM 5000000 UNITS IJ SOLR
2.5000 10*6.[IU] | INTRAMUSCULAR | Status: DC
  Filled 2014-03-12 (×3): qty 2.5

## 2014-03-12 MED ORDER — OXYCODONE-ACETAMINOPHEN 5-325 MG PO TABS: 1.0000 | ORAL_TABLET | ORAL | Status: DC | PRN

## 2014-03-12 MED ORDER — ZOLPIDEM TARTRATE 5 MG PO TABS: 5.0000 mg | ORAL_TABLET | Freq: Every evening | ORAL | Status: DC | PRN

## 2014-03-12 MED ORDER — TERBUTALINE SULFATE 1 MG/ML IJ SOLN: 0.2500 mg | Freq: Once | INTRAMUSCULAR | Status: DC | PRN

## 2014-03-12 MED ORDER — DIPHENHYDRAMINE HCL 25 MG PO CAPS
25.0000 mg | ORAL_CAPSULE | Freq: Four times a day (QID) | ORAL | Status: DC | PRN
Start: 2014-03-12 — End: 2014-03-14

## 2014-03-12 MED ORDER — DIBUCAINE 1 % RE OINT: 1.0000 "application " | TOPICAL_OINTMENT | RECTAL | Status: DC | PRN

## 2014-03-12 MED ORDER — PHENYLEPHRINE 40 MCG/ML (10ML) SYRINGE FOR IV PUSH (FOR BLOOD PRESSURE SUPPORT)
PREFILLED_SYRINGE | INTRAVENOUS | Status: AC
  Filled 2014-03-12: qty 10

## 2014-03-12 MED ORDER — LACTATED RINGERS IV SOLN: 500.0000 mL | Freq: Once | INTRAVENOUS | Status: DC

## 2014-03-12 MED ORDER — ACETAMINOPHEN 325 MG PO TABS: 650.0000 mg | ORAL_TABLET | ORAL | Status: DC | PRN

## 2014-03-12 MED ORDER — TETANUS-DIPHTH-ACELL PERTUSSIS 5-2.5-18.5 LF-MCG/0.5 IM SUSP: 0.5000 mL | Freq: Once | INTRAMUSCULAR | Status: DC

## 2014-03-12 MED ORDER — PRENATAL MULTIVITAMIN CH
1.0000 | ORAL_TABLET | Freq: Every day | ORAL | Status: DC
  Administered 2014-03-13 – 2014-03-14 (×2): 1 via ORAL
  Filled 2014-03-12 (×2): qty 1

## 2014-03-12 MED ORDER — FENTANYL 2.5 MCG/ML BUPIVACAINE 1/10 % EPIDURAL INFUSION (WH - ANES)
14.0000 mL/h | INTRAMUSCULAR | Status: DC | PRN
  Administered 2014-03-12: 14 mL/h via EPIDURAL

## 2014-03-12 MED ORDER — FENTANYL 2.5 MCG/ML BUPIVACAINE 1/10 % EPIDURAL INFUSION (WH - ANES): 14.0000 mL/h | INTRAMUSCULAR | Status: DC | PRN

## 2014-03-12 MED ORDER — PENICILLIN G POTASSIUM 5000000 UNITS IJ SOLR
5.0000 10*6.[IU] | Freq: Once | INTRAMUSCULAR | Status: AC
  Administered 2014-03-12: 5 10*6.[IU] via INTRAVENOUS
  Filled 2014-03-12: qty 5

## 2014-03-12 MED ORDER — IBUPROFEN 600 MG PO TABS
600.0000 mg | ORAL_TABLET | Freq: Four times a day (QID) | ORAL | Status: DC
  Administered 2014-03-12 – 2014-03-14 (×7): 600 mg via ORAL
  Filled 2014-03-12 (×8): qty 1

## 2014-03-12 MED ORDER — LACTATED RINGERS IV SOLN
INTRAVENOUS | Status: DC
  Administered 2014-03-12 (×2): via INTRAVENOUS

## 2014-03-12 MED ORDER — PHENYLEPHRINE 40 MCG/ML (10ML) SYRINGE FOR IV PUSH (FOR BLOOD PRESSURE SUPPORT)
80.0000 ug | PREFILLED_SYRINGE | INTRAVENOUS | Status: DC | PRN
Start: 2014-03-12 — End: 2014-03-12
  Filled 2014-03-12: qty 2

## 2014-03-12 MED ORDER — ONDANSETRON HCL 4 MG/2ML IJ SOLN: 4.0000 mg | Freq: Four times a day (QID) | INTRAMUSCULAR | Status: DC | PRN

## 2014-03-12 MED ORDER — LIDOCAINE HCL (PF) 1 % IJ SOLN
30.0000 mL | INTRAMUSCULAR | Status: DC | PRN
  Filled 2014-03-12: qty 30

## 2014-03-12 MED ORDER — DIPHENHYDRAMINE HCL 50 MG/ML IJ SOLN: 12.5000 mg | INTRAMUSCULAR | Status: DC | PRN

## 2014-03-12 MED ORDER — OXYCODONE-ACETAMINOPHEN 5-325 MG PO TABS
1.0000 | ORAL_TABLET | ORAL | Status: DC | PRN
  Administered 2014-03-13 (×2): 1 via ORAL
  Filled 2014-03-12 (×2): qty 1

## 2014-03-12 MED ORDER — LACTATED RINGERS IV SOLN: 500.0000 mL | INTRAVENOUS | Status: DC | PRN

## 2014-03-12 MED ORDER — SENNOSIDES-DOCUSATE SODIUM 8.6-50 MG PO TABS
2.0000 | ORAL_TABLET | ORAL | Status: DC
  Administered 2014-03-13 – 2014-03-14 (×2): 2 via ORAL
  Filled 2014-03-12 (×2): qty 2

## 2014-03-12 NOTE — Anesthesia Procedure Notes (Signed)

## 2014-03-12 NOTE — Progress Notes (Signed)
Patient ID: Sonia Allen, female   DOB: 20-Nov-1985, 28 y.o.   MRN: [id number] Sonia Allen is a 28 y.o. [id number] at [redacted]w[redacted]d admitted for IOL indicated by A1GDM  Subjective: Comfortable.  Objective: BP 132/91  Pulse 78  Temp(Src) 97.6 F (36.4 C) (Oral)  Resp 20  Ht 5\' 2"  (1.575 m)  Wt 85.73 kg (189 lb)  BMI 34.56 kg/m2  LMP 06/02/2013 Filed Vitals:   03/12/14 0744 03/12/14 0754 03/12/14 0930  BP: 131/92  132/91  Pulse: 107  78  Temp: 97.6 F (36.4 C)    TempSrc: Oral    Resp: 20  20  Height:  5\' 2"  (1.575 m)   Weight:  85.73 kg (189 lb)    Fetal Heart FHR: 130 bpm, variability: moderate,  accelerations:  Present,  decelerations:  Absent   Contractions: irreg, mild  SVE:   Dilation: 1 Effacement (%): 50 Station: -3 Exam by:: Deidre Cristopher Ciccarelli CNM Cx posterior, ext 2 , int 1cm/30/-3; attempted FB placement manually, then placed FB successfully using speculum and sponge forceps Results for orders placed during the hospital encounter of 03/12/14 (from the past 24 hour(s))  CBC     Status: Abnormal   Collection Time    03/12/14  7:50 AM      Result Value Ref Range   WBC 8.0  4.0 - 10.5 K/uL   RBC 3.95  3.87 - 5.11 MIL/uL   Hemoglobin 10.9 (*) 12.0 - 15.0 g/dL   HCT 32.4 (*) 36.0 - 46.0 %   MCV 82.0  78.0 - 100.0 fL   MCH 27.6  26.0 - 34.0 pg   MCHC 33.6  30.0 - 36.0 g/dL   RDW 15.7 (*) 11.5 - 15.5 %   Platelets 295  150 - 400 K/uL  GLUCOSE, CAPILLARY     Status: Abnormal   Collection Time    03/12/14  8:53 AM      Result Value Ref Range   Glucose-Capillary 100 (*) 70 - 99 mg/dL   Assessment / Plan: Multip at [redacted]w[redacted]d with A1GDM, cx unfavorable>FB placed GBS carriage> PCN with labor onset Marginal cord insertion Lapsed PNC Borderline BP>watch  Labor: n/a Fetal Wellbeing: Category 1 Pain Control:  n/a Expected mode of delivery: NSVD  Miosha Behe 03/12/2014, 9:51 AM

## 2014-03-12 NOTE — H&P (Signed)
Obstetric History and Physical  Sonia Allen is a 28 y.o. [phone] with IUP at [redacted]w[redacted]d presenting for IOL for A1GDM. Last HgA1C on 03/01/14 was 6.2.  Of note, patient had no care between [redacted]w[redacted]d to [redacted]w[redacted]d, missed 38 week ultrasound.  She then was seen at [redacted]w[redacted]d and scheduled for IOL today. Patient states she has been having no contractions, no vaginal bleeding, intact membranes, with active fetal movement.  Prenatal Course Source of Care: [facility] Pregnancy complications or risks: Patient Active Problem List   Diagnosis Date Noted  . Pregnancy 03/12/2014  . Marginal insertion of umbilical cord 35/57/3220  . Gestational diabetes mellitus, antepartum 01/17/2014  . Uterine fibroids affecting pregnancy 10/14/2012  . Anemia 10/14/2012  . HEADACHE 02/18/2007   Genetic Screen Too late  Anatomic [place]  Normal (marginal cord insertion noted on anatomy scan)  Glucose Screen A1GDM  GBS  Positive  Feeding Preference Bottle  Contraception BTL vs Depo; papers signed 01/24/2014  Pediatrician [place]  Support Person FOB    Prenatal labs and studies: ABO, Rh: B/Positive/-- (04/20 0000) Antibody: Negative (04/20 0000) Rubella: Immune (04/20 0000) RPR: Nonreactive (04/20 0000)  HBsAg: Negative (04/20 0000)  HIV: Non-reactive (04/20 0000)  URK:YHCWCBJS (08/22 0000) Genetic screening  Too late Anatomy US normal  Past Medical History  Diagnosis Date  . Gonorrhea   . Chlamydia   . Arrhythmia   . Hx of migraine headaches   . Headache(784.0)   . Anemia     first and sec pregnancies  . Fibroid   . Leiomyoma of uterus in pregnancy   . Hemorrhoids   . Gestational diabetes     diet controlled    Past Surgical History  Procedure Laterality Date  . Wisdom tooth extraction    . Induced abortion  March  2 ,2013    OB History  Gravida Para Term Preterm AB SAB TAB Ectopic Multiple Living  5 3 3  0 1 0 1 0 0 3    # Outcome Date GA Lbr Len/2nd Weight Sex Delivery Anes PTL Lv  5 CUR           4  TRM 10/14/12 [redacted]w[redacted]d 03:40 / 00:13 7 lb 5.6 oz (3.334 kg) M SVD EPI  Y     Comments: +GBS  3 TAB 2013 [redacted]w[redacted]d            Comments: System Generated. Please review and update pregnancy details.  2 TRM 08/28/10 [redacted]w[redacted]d 07:00 8 lb 13 oz (3.997 kg) M SVD EPI  Y     Comments: leiomyomatous uterus  1 TRM 01/14/08 [redacted]w[redacted]d 06:00 6 lb 2 oz (2.778 kg) M SVD EPI  Y      History   Social History  . Marital Status: Single    Spouse Name: N/A    Number of Children: N/A  . Years of Education: N/A   Social History Main Topics  . Smoking status: Never Smoker   . Smokeless tobacco: Never Used  . Alcohol Use: No     Comment: weekly- 1/2 bottle liquor  . Drug Use: No  . Sexual Activity: Yes   Other Topics Concern  . None   Social History Narrative  . None    Family History  Problem Relation Age of Onset  . Other Neg Hx   . Alcohol abuse Neg Hx   . Arthritis Neg Hx   . Asthma Neg Hx   . Birth defects Neg Hx   . Cancer Neg Hx   .  COPD Neg Hx   . Depression Neg Hx   . Diabetes Neg Hx   . Drug abuse Neg Hx   . Early death Neg Hx   . Heart disease Neg Hx   . Hearing loss Neg Hx   . Hyperlipidemia Neg Hx   . Hypertension Neg Hx   . Kidney disease Neg Hx   . Learning disabilities Neg Hx   . Mental illness Neg Hx   . Mental retardation Neg Hx   . Miscarriages / Stillbirths Neg Hx   . Stroke Neg Hx   . Vision loss Neg Hx     Facility-administered medications prior to admission  Medication Dose Route Frequency Provider Last Rate Last Dose  . Tdap (BOOSTRIX) injection 0.5 mL  0.5 mL Intramuscular Once Truett Mainland, DO       Prescriptions prior to admission  Medication Sig Dispense Refill  . Prenatal Vit-Fe Fumarate-FA (PRENATAL MULTIVITAMIN) TABS Take 1 tablet by mouth daily.       Marland Kitchen ACCU-CHEK FASTCLIX LANCETS MISC Inject 1 each into the skin 4 (four) times daily. 648.83 for testing 4 times daily  102 each  12  . glucose blood (ACCU-CHEK SMARTVIEW) test strip Use as instructed to check  blood sugars  100 each  12    No Known Allergies  Review of Systems: Negative except for what is mentioned in HPI.  Physical Exam: (by Deirdre Poe, CNM) BP 132/91  Pulse 78  Temp(Src) 97.6 F (36.4 C) (Oral)  Resp 20  Ht 5\' 2"  (1.575 m)  Wt 189 lb (85.73 kg)  BMI 34.56 kg/m2  LMP 06/02/2013 GENERAL: Well-developed, well-nourished female in no acute distress.  LUNGS: Clear to auscultation bilaterally.  HEART: Regular rate and rhythm. ABDOMEN: Soft, nontender, nondistended, gravid. EXTREMITIES: Nontender, no edema, 2+ distal pulses. Cervical Exam: Dilatation 1 cm   Effacement 50%   Station -3  Presentation: cephalic FHT:  Baseline rate 135 bpm   Variability moderate  Accelerations present   Decelerations none Contractions: Every 4-8 mins   Pertinent Labs/Studies:   Results for orders placed during the hospital encounter of 03/12/14 (from the past 24 hour(s))  CBC     Status: Abnormal   Collection Time    03/12/14  7:50 AM      Result Value Ref Range   WBC 8.0  4.0 - 10.5 K/uL   RBC 3.95  3.87 - 5.11 MIL/uL   Hemoglobin 10.9 (*) 12.0 - 15.0 g/dL   HCT 32.4 (*) 36.0 - 46.0 %   MCV 82.0  78.0 - 100.0 fL   MCH 27.6  26.0 - 34.0 pg   MCHC 33.6  30.0 - 36.0 g/dL   RDW 15.7 (*) 11.5 - 15.5 %   Platelets 295  150 - 400 K/uL  GLUCOSE, CAPILLARY     Status: Abnormal   Collection Time    03/12/14  8:53 AM      Result Value Ref Range   Glucose-Capillary 100 (*) 70 - 99 mg/dL    Assessment : Sonia Allen is a 28 y.o. [id number] at [redacted]w[redacted]d being admitted for induction of labor for A1GDM.  Plan: Labor: Induction per protocol.  Foley bulb to be placed A1GDM: Will check CBGs as per protocol. FWB: Reassuring fetal heart tracing.  GBS positive, will treat with PCN. Delivery plan: Hopeful for vaginal delivery    Verita Schneiders, MD, [place] Attending [place], [place]

## 2014-03-12 NOTE — Lactation Note (Signed)
This note was copied from the chart of Sonia Allen. Lactation Consultation Note  Patient Name: Sonia Anisah Kuck PIRJJ'O Date: 03/12/2014 Reason for consult: Other (Comment) (charting for exclusion)   Maternal Data Formula Feeding for Exclusion: Yes Reason for exclusion: Mother's choice to formula feed on admision  Feeding Feeding Type: Bottle Fed - Formula  LATCH Score/Interventions                      Lactation Tools Discussed/Used     Consult Status Consult Status: Complete    Bernita Buffy 03/12/2014, 9:00 PM

## 2014-03-12 NOTE — Progress Notes (Signed)
Patient ID: Sonia Allen, female   DOB: 01/19/1986, 28 y.o.   MRN: [id number] Sonia Allen is a 28 y.o. [id number] at [redacted]w[redacted]d admitted for IOL for A1GDM  Subjective: Comfortable. Foley bulb just fell out. No H/A, visual disturbance  Objective: BP 126/90  Pulse 85  Temp(Src) 97.6 F (36.4 C) (Oral)  Resp 20  Ht 5\' 2"  (1.575 m)  Wt 85.73 kg (189 lb)  BMI 34.56 kg/m2  LMP 06/02/2013 Filed Vitals:   03/12/14 0744 03/12/14 0754 03/12/14 0930 03/12/14 1023  BP: 131/92  132/91 126/90  Pulse: 107  78 85  Temp: 97.6 F (36.4 C)     TempSrc: Oral     Resp: 20  20 20   Height:  5\' 2"  (1.575 m)    Weight:  85.73 kg (189 lb)     Fetal Heart FHR: 135-140 bpm, variability: moderate,  accelerations:  Present,  decelerations:  Absent except isolated sharp variable. 10 bpm accels x past hour, reactive 1330  Contractions: UI and occ UCs  SVE:   Dilation: 3.5 Effacement (%): 50 Station: -3 Exam by:: diedre, CNM Very posterior, thick  Assessment / Plan:  Cx more favorable after FB expelled> start pitocin and PCN A1GDM DBPs elevated> check CMP, P:C ratio Labor: n/a Fetal Wellbeing: cat 1 Pain Control:  N/a. Wants epidural later Expected mode of delivery: NSVD  Sonia Allen,Sonia Allen 03/12/2014, 2:19 PM

## 2014-03-12 NOTE — Progress Notes (Signed)
Patient ID: Sonia Allen, female   DOB: Jul 27, 1985, 28 y.o.   MRN: [id number] Sonia Allen is a 27 y.o. [phone] at [redacted]w[redacted]d admitted for IOL indicated by A1GDM Subjective: Waiting for epidural.  Objective: BP 129/88  Pulse 90  Temp(Src) 97.6 F (36.4 C) (Oral)  Resp 18  Ht 5\' 2"  (1.575 m)  Wt 85.73 kg (189 lb)  BMI 34.56 kg/m2  LMP 06/02/2013  Fetal Heart FHR: 140 bpm, variability: moderate,  accelerations:  Present,  decelerations:  Absent   Contractions: q 2-4 min.pitocin at 4 mu  SVE:   Dilation: 3.5 Effacement (%): 50 Station: -3 Exam by:: Sonia Allen, CNM Results for orders placed during the hospital encounter of 03/12/14 (from the past 24 hour(s))  CBC     Status: Abnormal   Collection Time    03/12/14  7:50 AM      Result Value Ref Range   WBC 8.0  4.0 - 10.5 K/uL   RBC 3.95  3.87 - 5.11 MIL/uL   Hemoglobin 10.9 (*) 12.0 - 15.0 g/dL   HCT 32.4 (*) 36.0 - 46.0 %   MCV 82.0  78.0 - 100.0 fL   MCH 27.6  26.0 - 34.0 pg   MCHC 33.6  30.0 - 36.0 g/dL   RDW 15.7 (*) 11.5 - 15.5 %   Platelets 295  150 - 400 K/uL  COMPREHENSIVE METABOLIC PANEL     Status: Abnormal   Collection Time    03/12/14  7:50 AM      Result Value Ref Range   Sodium 137  137 - 147 mEq/L   Potassium 4.3  3.7 - 5.3 mEq/L   Chloride 103  96 - 112 mEq/L   CO2 20  19 - 32 mEq/L   Glucose, Bld 83  70 - 99 mg/dL   BUN 9  6 - 23 mg/dL   Creatinine, Ser 0.57  0.50 - 1.10 mg/dL   Calcium 9.0  8.4 - 10.5 mg/dL   Total Protein 6.1  6.0 - 8.3 g/dL   Albumin 2.8 (*) 3.5 - 5.2 g/dL   AST 15  0 - 37 U/L   ALT 10  0 - 35 U/L   Alkaline Phosphatase 116  39 - 117 U/L   Total Bilirubin 0.3  0.3 - 1.2 mg/dL   GFR calc non Af Amer >90  >90 mL/min   GFR calc Af Amer >90  >90 mL/min   Anion gap 14  5 - 15  GLUCOSE, CAPILLARY     Status: Abnormal   Collection Time    03/12/14  8:53 AM      Result Value Ref Range   Glucose-Capillary 100 (*) 70 - 99 mg/dL  PROTEIN / CREATININE RATIO, URINE     Status: None   Collection  Time    03/12/14  3:37 PM      Result Value Ref Range   Creatinine, Urine 215.82     Total Protein, Urine 32.7     PROTEIN CREATININE RATIO 0.15  0.00 - 0.15   Assessment / Plan:  BPs borderline, no evidence preE  Labor: induction in progress Fetal Wellbeing: Cat1 Pain Control:  Will get apidural Expected mode of delivery: NSVD  Sonia Allen 03/12/2014, 4:40 PM

## 2014-03-12 NOTE — Anesthesia Preprocedure Evaluation (Signed)
Anesthesia Evaluation  Patient identified by MRN, date of birth, ID band Patient awake    Reviewed: Allergy & Precautions, H&P , Patient's Chart, lab work & pertinent test results  Airway Mallampati: II TM Distance: >3 FB Neck ROM: full    Dental  (+) Teeth Intact   Pulmonary  breath sounds clear to auscultation        Cardiovascular Rhythm:regular Rate:Normal     Neuro/Psych    GI/Hepatic   Endo/Other  diabetes  Renal/GU      Musculoskeletal   Abdominal   Peds  Hematology   Anesthesia Other Findings       Reproductive/Obstetrics (+) Pregnancy                           Anesthesia Physical Anesthesia Plan  ASA: II  Anesthesia Plan: Epidural   Post-op Pain Management:    Induction:   Airway Management Planned:   Additional Equipment:   Intra-op Plan:   Post-operative Plan:   Informed Consent: I have reviewed the patients History and Physical, chart, labs and discussed the procedure including the risks, benefits and alternatives for the proposed anesthesia with the patient or authorized representative who has indicated his/her understanding and acceptance.   Dental Advisory Given  Plan Discussed with:   Anesthesia Plan Comments: (Labs checked- platelets confirmed with RN in room. Fetal heart tracing, per RN, reported to be stable enough for sitting procedure. Discussed epidural, and patient consents to the procedure:  included risk of possible headache,backache, failed block, allergic reaction, and nerve injury. This patient was asked if she had any questions or concerns before the procedure started.)        Anesthesia Quick Evaluation

## 2014-03-13 NOTE — Progress Notes (Signed)
Post Partum Day 1  Subjective: no complaints, up ad lib, voiding and tolerating PO  Objective: Blood pressure 125/82, pulse 82, temperature 98.2 F (36.8 C), temperature source Oral, resp. rate 18, height 5\' 2"  (1.575 m), weight 189 lb (85.73 kg), last menstrual period 06/02/2013, SpO2 99.00%, unknown if currently breastfeeding.  Physical Exam:  General: alert, cooperative and no distress Lochia: appropriate Uterine Fundus: firm Incision: healing well DVT Evaluation: No evidence of DVT seen on physical exam.   Recent Labs  03/12/14 0750  HGB 10.9*  HCT 32.4*    Assessment/Plan: Plan for discharge tomorrow since she delivered late in the day   LOS: 1 day   [facility] 03/13/2014, 6:23 AM

## 2014-03-13 NOTE — Progress Notes (Signed)
Clinical Social Work Department PSYCHOSOCIAL ASSESSMENT - MATERNAL/CHILD 03/13/2014  Patient:  Lata,Mel  Account Number:  [id number]  [facility] Date:  03/12/2014  Ardine Eng Name:   Jeanell Sparrow    Clinical Social Worker:  Delrae Sawyers   Date/Time:  03/13/2014 10:00 AM  Date Referred:  03/13/2014   Referral source  Central Nursery     Referred reason  [facility]   Other referral source:    I:  FAMILY / [place] legal guardian:  PARENT  Guardian - Name Guardian - Age Guardian - Address  Bowermaster,Paislee [address]  [place], [place] [address]  Dalbert [place]     Other household support members/support persons Other support:   Mother reports adequate family support    II  PSYCHOSOCIAL DATA Information Source:    Insurance risk surveyor Resources Employment:   FOB is employed   Museum/gallery curator resources:  [employer] If [place]:   Other  [place] / Grade:   Maternity Care Coordinator / Child Services Coordination / Early Interventions:  Cultural issues impacting care:    III  STRENGTHS Strengths  Supportive family/friends  Home prepared for Child (including basic supplies)  Adequate Resources   Strength comment:    IV  RISK FACTORS AND CURRENT PROBLEMS Current Problem:       V  SOCIAL WORK ASSESSMENT Acknowledged order for social work consult due to limited prenatal care.  Met with mother who was and receptive to social work intervention.    She is a single parent with 3 other dependents ages 28,3, and 1  FOB is reportedly employed and supportive.    Mother states that she received [place] at the health department, then was referred to [facility] and was able to transition with no problem.     She denies hx of mental illness or substance abuse.  Mother informed of the hospital's drug screen policy.   UDS on newborn was negative.   No acute social concerns noted or reported at this time.  Mother informed of social work  Fish farm manager.      VI SOCIAL WORK PLAN Social Work Plan  No Barriers to Discharge   Type of pt/family education:   If child protective services report - county:   If child protective services report - date:   Information/referral to community resources comment:   Other social work plan:   Will continue to monitor drug screen

## 2014-03-13 NOTE — Anesthesia Postprocedure Evaluation (Signed)
Anesthesia Post Note  Patient: Sonia Allen  Procedure(s) Performed: * No procedures listed *  Anesthesia type: Epidural  Patient location: Mother/Baby  Post pain: Pain level controlled  Post assessment: Post-op Vital signs reviewed  Last Vitals:  Filed Vitals:   03/13/14 0735  BP: 109/76  Pulse: 67  Temp: 36.8 C  Resp: 18    Post vital signs: Reviewed  Level of consciousness:alert  Complications: No apparent anesthesia complications

## 2014-03-14 MED ORDER — OXYCODONE-ACETAMINOPHEN 5-325 MG PO TABS: 1.0000 | ORAL_TABLET | ORAL | Status: DC | PRN

## 2014-03-14 MED ORDER — IBUPROFEN 600 MG PO TABS: 600.0000 mg | ORAL_TABLET | Freq: Four times a day (QID) | ORAL | Status: DC

## 2014-03-14 NOTE — Discharge Summary (Signed)
Obstetric Discharge Summary Reason for Admission: onset of labor Prenatal Procedures: ultrasound Intrapartum Procedures: spontaneous vaginal delivery Postpartum Procedures: none Complications-Operative and Postpartum: none Hemoglobin  Date Value Ref Range Status  03/12/2014 10.9* 12.0 - 15.0 g/dL Final  10/18/2013 9.9   Final     HCT  Date Value Ref Range Status  03/12/2014 32.4* 36.0 - 46.0 % Final  10/18/2013 30   Final    Physical Exam:  General: alert, cooperative, appears stated age and no distress Lochia: appropriate Uterine Fundus: firm Incision: n/a DVT Evaluation: No evidence of DVT seen on physical exam. Negative Homan's sign. No cords or calf tenderness. No significant calf/ankle edema.  Discharge Diagnoses: Term Pregnancy-delivered  Discharge Information: Date: 03/14/2014 Activity: pelvic rest Diet: routine Medications: PNV, Ibuprofen and Percocet Condition: stable and improved Instructions: refer to practice specific booklet Discharge to: home   Newborn Data: Live born female  Birth Weight: 7 lb 10.2 oz (3465 g) APGAR: 9, 9  Home with mother.  Sonia Allen, Sonia Allen 03/14/2014, 7:01 AM

## 2014-04-25 ENCOUNTER — Encounter: Payer: Self-pay | Admitting: Obstetrics & Gynecology

## 2014-04-25 ENCOUNTER — Ambulatory Visit (INDEPENDENT_AMBULATORY_CARE_PROVIDER_SITE_OTHER): Payer: 59 | Admitting: Obstetrics & Gynecology

## 2014-04-25 DIAGNOSIS — Z3042 Encounter for surveillance of injectable contraceptive: Secondary | ICD-10-CM

## 2014-04-25 LAB — POCT PREGNANCY, URINE: PREG TEST UR: NEGATIVE

## 2014-04-25 MED ORDER — MEDROXYPROGESTERONE ACETATE 104 MG/0.65ML ~~LOC~~ SUSP
104.0000 mg | Freq: Once | SUBCUTANEOUS | Status: AC
  Administered 2014-04-25: 104 mg via SUBCUTANEOUS

## 2014-04-25 NOTE — Progress Notes (Signed)
Patient here for PP visit. Desires depo provera for contraception. Patient reports having unprotected intercourse 3 weeks ago. UPT obtained.

## 2014-05-02 ENCOUNTER — Encounter: Payer: Self-pay | Admitting: Obstetrics & Gynecology

## 2014-09-28 ENCOUNTER — Ambulatory Visit (INDEPENDENT_AMBULATORY_CARE_PROVIDER_SITE_OTHER): Payer: 59 | Admitting: Emergency Medicine

## 2014-09-28 VITALS — BP 106/64 | HR 92 | Temp 98.7°F | Resp 18 | Ht 64.0 in | Wt 179.0 lb

## 2014-09-28 DIAGNOSIS — K529 Noninfective gastroenteritis and colitis, unspecified: Secondary | ICD-10-CM

## 2014-09-28 MED ORDER — LOPERAMIDE HCL 2 MG PO TABS: ORAL_TABLET | ORAL | Status: DC

## 2014-09-28 MED ORDER — ONDANSETRON 8 MG PO TBDP: 8.0000 mg | ORAL_TABLET | Freq: Three times a day (TID) | ORAL | Status: DC | PRN

## 2014-09-28 NOTE — Patient Instructions (Signed)
Clear Liquid Diet A clear liquid diet is a short-term diet that is prescribed to provide the necessary fluid and basic energy you need when you can have nothing else. The clear liquid diet consists of liquids or solids that will become liquid at room temperature. You should be able to see through the liquid. There are many reasons that you may be restricted to clear liquids, such as:  When you have a sudden-onset (acute) condition that occurs before or after surgery.  To help your body slowly get adjusted to food again after a long period when you were unable to have food.  Replacement of fluids when you have a diarrheal disease.  When you are going to have certain exams, such as a colonoscopy, in which instruments are inserted inside your body to look at parts of your digestive system. WHAT CAN I HAVE? A clear liquid diet does not provide all the nutrients you need. It is important to choose a variety of the following items to get as many nutrients as possible:  Vegetable juices that do not have pulp.  Fruit juices and fruit drinks that do not have pulp.  Coffee (regular or decaffeinated), tea, or soda at the discretion of your health care provider.  Clear bouillon, broth, or strained broth-based soups.  High-protein and flavored gelatins.  Sugar or honey.  Ices or frozen ice pops that do not contain milk. If you are not sure whether you can have certain items, you should ask your health care provider. You may also ask your health care provider if there are any other clear liquid options. Document Released: 06/17/2005 Document Revised: 06/22/2013 Document Reviewed: 05/14/2013 ExitCare Patient Information 2015 [employer]. This information is not intended to replace advice given to you by your health care provider. Make sure you discuss any questions you have with your health care provider. Viral Gastroenteritis Viral gastroenteritis is also known as stomach flu. This condition  affects the stomach and intestinal tract. It can cause sudden diarrhea and vomiting. The illness typically lasts 3 to 8 days. Most people develop an immune response that eventually gets rid of the virus. While this natural response develops, the virus can make you quite ill. CAUSES  Many different viruses can cause gastroenteritis, such as rotavirus or noroviruses. You can catch one of these viruses by consuming contaminated food or water. You may also catch a virus by sharing utensils or other personal items with an infected person or by touching a contaminated surface. SYMPTOMS  The most common symptoms are diarrhea and vomiting. These problems can cause a severe loss of body fluids (dehydration) and a body salt (electrolyte) imbalance. Other symptoms may include:  Fever.  Headache.  Fatigue.  Abdominal pain. DIAGNOSIS  Your caregiver can usually diagnose viral gastroenteritis based on your symptoms and a physical exam. A stool sample may also be taken to test for the presence of viruses or other infections. TREATMENT  This illness typically goes away on its own. Treatments are aimed at rehydration. The most serious cases of viral gastroenteritis involve vomiting so severely that you are not able to keep fluids down. In these cases, fluids must be given through an intravenous line (IV). HOME CARE INSTRUCTIONS   Drink enough fluids to keep your urine clear or pale yellow. Drink small amounts of fluids frequently and increase the amounts as tolerated.  Ask your caregiver for specific rehydration instructions.  Avoid:  Foods high in sugar.  Alcohol.  Carbonated drinks.  Tobacco.  Juice.    Caffeine drinks.  Extremely hot or cold fluids.  Fatty, greasy foods.  Too much intake of anything at one time.  Dairy products until 24 to 48 hours after diarrhea stops.  You may consume probiotics. Probiotics are active cultures of beneficial bacteria. They may lessen the amount and  number of diarrheal stools in adults. Probiotics can be found in yogurt with active cultures and in supplements.  Wash your hands well to avoid spreading the virus.  Only take over-the-counter or prescription medicines for pain, discomfort, or fever as directed by your caregiver. Do not give aspirin to children. Antidiarrheal medicines are not recommended.  Ask your caregiver if you should continue to take your regular prescribed and over-the-counter medicines.  Keep all follow-up appointments as directed by your caregiver. SEEK IMMEDIATE MEDICAL CARE IF:   You are unable to keep fluids down.  You do not urinate at least once every 6 to 8 hours.  You develop shortness of breath.  You notice blood in your stool or vomit. This may look like coffee grounds.  You have abdominal pain that increases or is concentrated in one small area (localized).  You have persistent vomiting or diarrhea.  You have a fever.  The patient is a child younger than 3 months, and he or she has a fever.  The patient is a child older than 3 months, and he or she has a fever and persistent symptoms.  The patient is a child older than 3 months, and he or she has a fever and symptoms suddenly get worse.  The patient is a baby, and he or she has no tears when crying. MAKE SURE YOU:   Understand these instructions.  Will watch your condition.  Will get help right away if you are not doing well or get worse. Document Released: 06/17/2005 Document Revised: 09/09/2011 Document Reviewed: 04/03/2011 ExitCare Patient Information 2015 [employer]. This information is not intended to replace advice given to you by your health care provider. Make sure you discuss any questions you have with your health care provider.  

## 2014-09-28 NOTE — Progress Notes (Signed)
[facility] [address] [place] [address] [phone]  Date:  09/28/2014   Name:  Sonia Allen   DOB:  Jun 20, 1986   MRN:  [id number]  PCP:  No PCP Per Patient    Chief Complaint: Chills; Fatigue; Sore Throat; Cough; Diarrhea; and Ear Pain   History of Present Illness:  Sonia Allen is a 29 y.o. very pleasant female patient who presents with the following:  Ill since Friday with nausea and diarrhea The patient has no complaint of blood, mucous, or pus in her stools. No vomiting.  Initially sore throat now gone Felt hot with chills Poor po intake Three ill children at home. No improvement with over the counter medications or other home remedies.  Denies other complaint or health concern today.   Patient Active Problem List   Diagnosis Date Noted  . Pregnancy 03/12/2014  . Marginal insertion of umbilical cord 62/13/0865  . NSVD (normal spontaneous vaginal delivery) 03/12/2014  . Gestational diabetes mellitus, antepartum 01/17/2014  . Uterine fibroids affecting pregnancy 10/14/2012  . Anemia 10/14/2012  . HEADACHE 02/18/2007    Past Medical History  Diagnosis Date  . Gonorrhea   . Chlamydia   . Arrhythmia   . Hx of migraine headaches   . Headache(784.0)   . Anemia     first and sec pregnancies  . Fibroid   . Leiomyoma of uterus in pregnancy   . Hemorrhoids   . Gestational diabetes     diet controlled    Past Surgical History  Procedure Laterality Date  . Wisdom tooth extraction    . Induced abortion  March  2 ,2013    History  Substance Use Topics  . Smoking status: Never Smoker   . Smokeless tobacco: Never Used  . Alcohol Use: No     Comment: weekly- 1/2 bottle liquor    Family History  Problem Relation Age of Onset  . Other Neg Hx   . Alcohol abuse Neg Hx   . Arthritis Neg Hx   . Asthma Neg Hx   . Birth defects Neg Hx   . Cancer Neg Hx   . COPD Neg Hx   . Depression Neg Hx   . Diabetes Neg Hx   . Drug abuse Neg Hx    . Early death Neg Hx   . Heart disease Neg Hx   . Hearing loss Neg Hx   . Hyperlipidemia Neg Hx   . Hypertension Neg Hx   . Kidney disease Neg Hx   . Learning disabilities Neg Hx   . Mental illness Neg Hx   . Mental retardation Neg Hx   . Miscarriages / Stillbirths Neg Hx   . Stroke Neg Hx   . Vision loss Neg Hx     No Known Allergies  Medication list has been reviewed and updated.  Current Outpatient Prescriptions on File Prior to Visit  Medication Sig Dispense Refill  . ibuprofen (ADVIL,MOTRIN) 600 MG tablet Take 1 tablet (600 mg total) by mouth every 6 (six) hours. 30 tablet 0  . ACCU-CHEK FASTCLIX LANCETS MISC Inject 1 each into the skin 4 (four) times daily. 648.83 for testing 4 times daily (Patient not taking: Reported on 09/28/2014) 102 each 12  . glucose blood (ACCU-CHEK SMARTVIEW) test strip Use as instructed to check blood sugars (Patient not taking: Reported on 09/28/2014) 100 each 12   Current Facility-Administered Medications on File Prior to Visit  Medication Dose Route Frequency Provider Last Rate  Last Dose  . Tdap (BOOSTRIX) injection 0.5 mL  0.5 mL Intramuscular Once Truett Mainland, DO        Review of Systems:  As per HPI, otherwise negative.    Physical Examination: Filed Vitals:   09/28/14 1715  BP: 106/64  Pulse: 92  Temp: 98.7 F (37.1 C)  Resp: 18   Filed Vitals:   09/28/14 1715  Height: 5\' 4"  (1.626 m)  Weight: 179 lb (81.194 kg)   Body mass index is 30.71 kg/(m^2). Ideal Body Weight: Weight in (lb) to have BMI = 25: 145.3  GEN: WDWN, NAD, Non-toxic, A & O x 3  Dry appearing  HEENT: Atraumatic, Normocephalic. Neck supple. No masses, No LAD. Ears and Nose: No external deformity. CV: RRR, No M/G/R. No JVD. No thrill. No extra heart sounds. PULM: CTA B, no wheezes, crackles, rhonchi. No retractions. No resp. distress. No accessory muscle use. ABD: S, NT, ND, +BS. No rebound. No HSM. EXTR: No c/c/e NEURO Normal gait.  PSYCH: Normally  interactive. Conversant. Not depressed or anxious appearing.  Calm demeanor.    Assessment and Plan: Gastroenteritis Clears Imodium zofran  Signed,  Ellison Carwin, MD

## 2014-11-30 ENCOUNTER — Emergency Department (HOSPITAL_COMMUNITY)
Admission: EM | Admit: 2014-11-30 | Discharge: 2014-11-30 | Disposition: A | Discharge status: Home / Self Care | Payer: 59 | Attending: Emergency Medicine | Admitting: Emergency Medicine

## 2014-11-30 ENCOUNTER — Encounter (HOSPITAL_COMMUNITY): Payer: Self-pay | Admitting: Emergency Medicine

## 2014-11-30 DIAGNOSIS — Z8719 Personal history of other diseases of the digestive system: Secondary | ICD-10-CM | POA: Diagnosis not present

## 2014-11-30 DIAGNOSIS — Z8619 Personal history of other infectious and parasitic diseases: Secondary | ICD-10-CM | POA: Diagnosis not present

## 2014-11-30 DIAGNOSIS — N939 Abnormal uterine and vaginal bleeding, unspecified: Secondary | ICD-10-CM | POA: Diagnosis not present

## 2014-11-30 DIAGNOSIS — Z3202 Encounter for pregnancy test, result negative: Secondary | ICD-10-CM | POA: Diagnosis not present

## 2014-11-30 DIAGNOSIS — Z86018 Personal history of other benign neoplasm: Secondary | ICD-10-CM | POA: Diagnosis not present

## 2014-11-30 DIAGNOSIS — Z8679 Personal history of other diseases of the circulatory system: Secondary | ICD-10-CM | POA: Insufficient documentation

## 2014-11-30 DIAGNOSIS — D649 Anemia, unspecified: Secondary | ICD-10-CM

## 2014-11-30 DIAGNOSIS — Z8632 Personal history of gestational diabetes: Secondary | ICD-10-CM | POA: Diagnosis not present

## 2014-11-30 DIAGNOSIS — R1030 Lower abdominal pain, unspecified: Secondary | ICD-10-CM | POA: Diagnosis present

## 2014-11-30 LAB — CBC WITH DIFFERENTIAL/PLATELET
BASOS PCT: 0 % (ref 0–1)
Basophils Absolute: 0 10*3/uL (ref 0.0–0.1)
EOS ABS: 0.1 10*3/uL (ref 0.0–0.7)
Eosinophils Relative: 2 % (ref 0–5)
HCT: 34.5 % — ABNORMAL LOW (ref 36.0–46.0)
Hemoglobin: 11.1 g/dL — ABNORMAL LOW (ref 12.0–15.0)
LYMPHS ABS: 2.1 10*3/uL (ref 0.7–4.0)
Lymphocytes Relative: 36 % (ref 12–46)
MCH: 27.8 pg (ref 26.0–34.0)
MCHC: 32.2 g/dL (ref 30.0–36.0)
MCV: 86.5 fL (ref 78.0–100.0)
MONO ABS: 0.3 10*3/uL (ref 0.1–1.0)
MONOS PCT: 5 % (ref 3–12)
Neutro Abs: 3.3 10*3/uL (ref 1.7–7.7)
Neutrophils Relative %: 57 % (ref 43–77)
Platelets: 335 10*3/uL (ref 150–400)
RBC: 3.99 MIL/uL (ref 3.87–5.11)
RDW: 13.3 % (ref 11.5–15.5)
WBC: 5.8 10*3/uL (ref 4.0–10.5)

## 2014-11-30 LAB — URINALYSIS, ROUTINE W REFLEX MICROSCOPIC
BILIRUBIN URINE: NEGATIVE
Glucose, UA: NEGATIVE mg/dL
KETONES UR: NEGATIVE mg/dL
Leukocytes, UA: NEGATIVE
NITRITE: NEGATIVE
PROTEIN: NEGATIVE mg/dL
Specific Gravity, Urine: 1.029 (ref 1.005–1.030)
Urobilinogen, UA: 1 mg/dL (ref 0.0–1.0)
pH: 6 (ref 5.0–8.0)

## 2014-11-30 LAB — URINE MICROSCOPIC-ADD ON

## 2014-11-30 LAB — WET PREP, GENITAL
Trich, Wet Prep: NONE SEEN
Yeast Wet Prep HPF POC: NONE SEEN

## 2014-11-30 LAB — POC URINE PREG, ED: PREG TEST UR: NEGATIVE

## 2014-11-30 NOTE — ED Provider Notes (Signed)
CSN: [id number]     Arrival date & time 11/30/14  1744 History   First MD Initiated Contact with Patient 11/30/14 1833     Chief Complaint  Patient presents with  . Vaginal Bleeding  . Abdominal Pain     (Consider location/radiation/quality/duration/timing/severity/associated sxs/prior Treatment) HPI Sonia Allen is a 29 y.o female with a history of gonorrhea, chlamydia, anemia, fibroids, and leiomyoma who presents with vaginal spotting for the past 6 days after her period ended. She states her period lasted 10 days. She cannot remember dates of her beginning and ending. She states she also has abdominal pain that began 6 days ago. She denies multiple sexual partners and admits to having STD's in the past. She denies any fever, chills, chest pain, shortness of breath, nausea, vomiting, diarrhea, constipation, dysuria, hematuria, urinary frequency, vaginal discharge, vaginal pain. Past Medical History  Diagnosis Date  . Gonorrhea   . Chlamydia   . Arrhythmia   . Hx of migraine headaches   . Headache(784.0)   . Anemia     first and sec pregnancies  . Fibroid   . Leiomyoma of uterus in pregnancy   . Hemorrhoids   . Gestational diabetes     diet controlled   Past Surgical History  Procedure Laterality Date  . Wisdom tooth extraction    . Induced abortion  March  2 ,2013   Family History  Problem Relation Age of Onset  . Other Neg Hx   . Alcohol abuse Neg Hx   . Arthritis Neg Hx   . Asthma Neg Hx   . Birth defects Neg Hx   . Cancer Neg Hx   . COPD Neg Hx   . Depression Neg Hx   . Diabetes Neg Hx   . Drug abuse Neg Hx   . Early death Neg Hx   . Heart disease Neg Hx   . Hearing loss Neg Hx   . Hyperlipidemia Neg Hx   . Hypertension Neg Hx   . Kidney disease Neg Hx   . Learning disabilities Neg Hx   . Mental illness Neg Hx   . Mental retardation Neg Hx   . Miscarriages / Stillbirths Neg Hx   . Stroke Neg Hx   . Vision loss Neg Hx    History  Substance Use Topics  .  Smoking status: Never Smoker   . Smokeless tobacco: Never Used  . Alcohol Use: No     Comment: weekly- 1/2 bottle liquor   OB History    Gravida Para Term Preterm AB TAB SAB Ectopic Multiple Living   5 4 4  0 1 1 0 0 0 4     Review of Systems  Constitutional: Negative for fever.  Gastrointestinal: Negative for nausea, vomiting, abdominal pain, diarrhea, constipation and blood in stool.  Genitourinary: Positive for vaginal bleeding, menstrual problem and pelvic pain. Negative for urgency, frequency, vaginal discharge, difficulty urinating, vaginal pain and dyspareunia.  Neurological: Negative for dizziness.  All other systems reviewed and are negative.     Allergies  Review of patient's allergies indicates no known allergies.  Home Medications   Prior to Admission medications   Medication Sig Start Date End Date Taking? Authorizing Provider  ibuprofen (ADVIL,MOTRIN) 600 MG tablet Take 1 tablet (600 mg total) by mouth every 6 (six) hours. Patient not taking: Reported on 11/30/2014 03/14/14   Keitha Butte, CNM  loperamide (IMODIUM A-D) 2 MG tablet 2 now and one hourly prn diarrhea.  Max 8 tabs  in 24 hours Patient not taking: Reported on 11/30/2014 09/28/14   Roselee Culver, MD  ondansetron (ZOFRAN-ODT) 8 MG disintegrating tablet Take 1 tablet (8 mg total) by mouth every 8 (eight) hours as needed for nausea. Patient not taking: Reported on 11/30/2014 09/28/14   Roselee Culver, MD   BP 111/61 mmHg  Pulse 66  Temp(Src) 98.6 F (37 C) (Oral)  Resp 16  SpO2 100%  LMP 11/06/2014 Physical Exam  Constitutional: She is oriented to person, place, and time. She appears well-developed and well-nourished.  HENT:  Head: Normocephalic and atraumatic.  Eyes: Conjunctivae are normal.  Neck: Normal range of motion. Neck supple.  Cardiovascular: Normal rate, regular rhythm and normal heart sounds.   Pulmonary/Chest: Effort normal and breath sounds normal.  Abdominal: Soft. Normal  appearance. She exhibits no distension and no mass. There is tenderness in the suprapubic area. There is no rebound and no guarding.    Genitourinary:  Pelvic Exam: Chaperone present. Mild amount of vaginal bleeding. Cervical os is closed. No vaginal discharge noted. No vaginal wall laceration. Mild left adnexal tenderness.  Musculoskeletal: Normal range of motion.  Neurological: She is alert and oriented to person, place, and time.  Skin: Skin is warm and dry.  Nursing note and vitals reviewed.   ED Course  Procedures (including critical care time) Labs Review Labs Reviewed  WET PREP, GENITAL - Abnormal; Notable for the following:    Clue Cells Wet Prep HPF POC RARE (*)    WBC, Wet Prep HPF POC FEW (*)    All other components within normal limits  URINALYSIS, ROUTINE W REFLEX MICROSCOPIC (NOT AT [facility]) - Abnormal; Notable for the following:    Hgb urine dipstick LARGE (*)    All other components within normal limits  CBC WITH DIFFERENTIAL/PLATELET - Abnormal; Notable for the following:    Hemoglobin 11.1 (*)    HCT 34.5 (*)    All other components within normal limits  RPR  URINE MICROSCOPIC-ADD ON  POC URINE PREG, ED  GC/CHLAMYDIA PROBE AMP ([place]) NOT AT [facility]    Imaging Review No results found.   EKG Interpretation None      MDM   Final diagnoses:  Vaginal bleeding  Anemia, unspecified anemia type   She presents for vaginal spotting since her last menstrual cycle. Her vitals are stable. She is in no acute distress. She is mildly anemic but has been in the past. I do not believe she needs medication at this time to control bleeding. Her wet prep is negative for infection, UA negative for UTI, negative pregnancy test. She has no rebound tenderness on abdominal exam. I discussed that the hospital would inform her if her STD results were positive and prescribe her medication at that time. I did not treat her today because I do not suspect that she has an STD and her  exam is negative for vaginal discharge and wet prep is normal. I do not suspect ovarian torsion or tubo-ovarian abscess since this has been going on for several days and she has no rebound or guarding on exam. This is most likely a ruptured ovarian cyst. She can follow up with women's outpatient clinic. Patient verbally agrees with the plan.    Ottie Glazier, PA-C 12/01/14 1519  Charlesetta Shanks, MD 12/01/14 [phone]

## 2014-11-30 NOTE — ED Notes (Signed)
Pt states that she had a period that lasted for about week and half two weeks ago. Ever since pt states that she has vaginal bleeding, pt states that blood is small amount but bright red.  Pt has abd pain also.

## 2014-11-30 NOTE — Discharge Instructions (Signed)
Dysfunctional Uterine Bleeding Return for dizziness, increased vaginal bleeding, syncope. Follow up with women's outpatient clinic by making an appointment tomorrow. Normally, menstrual periods begin between ages 75 to 68 in young women. A normal menstrual cycle/period may begin every 23 days up to 35 days and lasts from 1 to 7 days. Around 12 to 14 days before your menstrual period starts, ovulation (ovary produces an egg) occurs. When counting the time between menstrual periods, count from the first day of bleeding of the previous period to the first day of bleeding of the next period. Dysfunctional (abnormal) uterine bleeding is bleeding that is different from a normal menstrual period. Your periods may come earlier or later than usual. They may be lighter, have blood clots or be heavier. You may have bleeding between periods, or you may skip one period or more. You may have bleeding after sexual intercourse, bleeding after menopause, or no menstrual period. CAUSES   Pregnancy (normal, miscarriage, tubal).  IUDs (intrauterine device, birth control).  Birth control pills.  Hormone treatment.  Menopause.  Infection of the cervix.  Blood clotting problems.  Infection of the inside lining of the uterus.  Endometriosis, inside lining of the uterus growing in the pelvis and other female organs.  Adhesions (scar tissue) inside the uterus.  Obesity or severe weight loss.  Uterine polyps inside the uterus.  Cancer of the vagina, cervix, or uterus.  Ovarian cysts or polycystic ovary syndrome.  Medical problems (diabetes, thyroid disease).  Uterine fibroids (noncancerous tumor).  Problems with your female hormones.  Endometrial hyperplasia, very thick lining and enlarged cells inside of the uterus.  Medicines that interfere with ovulation.  Radiation to the pelvis or abdomen.  Chemotherapy. DIAGNOSIS   Your doctor will discuss the history of your menstrual periods, medicines  you are taking, changes in your weight, stress in your life, and any medical problems you may have.  Your doctor will do a physical and pelvic examination.  Your doctor may want to perform certain tests to make a diagnosis, such as:  Pap test.  Blood tests.  Cultures for infection.  CT scan.  Ultrasound.  Hysteroscopy.  Laparoscopy.  MRI.  Hysterosalpingography.  D and C.  Endometrial biopsy. TREATMENT  Treatment will depend on the cause of the dysfunctional uterine bleeding (DUB). Treatment may include:  Observing your menstrual periods for a couple of months.  Prescribing medicines for medical problems, including:  Antibiotics.  Hormones.  Birth control pills.  Removing an IUD (intrauterine device, birth control).  Surgery:  D and C (scrape and remove tissue from inside the uterus).  Laparoscopy (examine inside the abdomen with a lighted tube).  Uterine ablation (destroy lining of the uterus with electrical current, laser, heat, or freezing).  Hysteroscopy (examine cervix and uterus with a lighted tube).  Hysterectomy (remove the uterus). HOME CARE INSTRUCTIONS   If medicines were prescribed, take exactly as directed. Do not change or switch medicines without consulting your caregiver.  Long term heavy bleeding may result in iron deficiency. Your caregiver may have prescribed iron pills. They help replace the iron that your body lost from heavy bleeding. Take exactly as directed.  Do not take aspirin or medicines that contain aspirin one week before or during your menstrual period. Aspirin may make the bleeding worse.  If you need to change your sanitary pad or tampon more than once every 2 hours, stay in bed with your feet elevated and a cold pack on your lower abdomen. Rest as much as  possible, until the bleeding stops or slows down.  Eat well-balanced meals. Eat foods high in iron. Examples are:  Leafy green vegetables.  Whole-grain breads and  cereals.  Eggs.  Meat.  Liver.  Do not try to lose weight until the abnormal bleeding has stopped and your blood iron level is back to normal. Do not lift more than ten pounds or do strenuous activities when you are bleeding.  For a couple of months, make note on your calendar, marking the start and ending of your period, and the type of bleeding (light, medium, heavy, spotting, clots or missed periods). This is for your caregiver to better evaluate your problem. SEEK MEDICAL CARE IF:   You develop nausea (feeling sick to your stomach) and vomiting, dizziness, or diarrhea while you are taking your medicine.  You are getting lightheaded or weak.  You have any problems that may be related to the medicine you are taking.  You develop pain with your DUB.  You want to remove your IUD.  You want to stop or change your birth control pills or hormones.  You have any type of abnormal bleeding mentioned above.  You are over 14 years old and have not had a menstrual period yet.  You are 29 years old and you are still having menstrual periods.  You have any of the symptoms mentioned above.  You develop a rash. SEEK IMMEDIATE MEDICAL CARE IF:   An oral temperature above 102 F (38.9 C) develops.  You develop chills.  You are changing your sanitary pad or tampon more than once an hour.  You develop abdominal pain.  You pass out or faint. Document Released: 06/14/2000 Document Revised: 09/09/2011 Document Reviewed: 05/16/2009 [facility] Patient Information 2015 [place], [place]. This information is not intended to replace advice given to you by your health care provider. Make sure you discuss any questions you have with your health care provider.

## 2014-12-01 LAB — GC/CHLAMYDIA PROBE AMP (~~LOC~~) NOT AT ARMC
Chlamydia: NEGATIVE
Neisseria Gonorrhea: NEGATIVE

## 2014-12-01 LAB — RPR: RPR: NONREACTIVE

## 2014-12-02 ENCOUNTER — Inpatient Hospital Stay (HOSPITAL_COMMUNITY): Payer: 59

## 2014-12-02 ENCOUNTER — Encounter (HOSPITAL_COMMUNITY): Payer: Self-pay | Admitting: *Deleted

## 2014-12-02 ENCOUNTER — Inpatient Hospital Stay (HOSPITAL_COMMUNITY)
Admission: AD | Admit: 2014-12-02 | Discharge: 2014-12-02 | Disposition: A | Discharge status: Home / Self Care | Payer: 59 | Source: Ambulatory Visit | Attending: Family Medicine | Admitting: Family Medicine

## 2014-12-02 DIAGNOSIS — N939 Abnormal uterine and vaginal bleeding, unspecified: Secondary | ICD-10-CM | POA: Diagnosis not present

## 2014-12-02 DIAGNOSIS — N8320 Unspecified ovarian cysts: Secondary | ICD-10-CM | POA: Diagnosis not present

## 2014-12-02 DIAGNOSIS — D252 Subserosal leiomyoma of uterus: Secondary | ICD-10-CM | POA: Insufficient documentation

## 2014-12-02 DIAGNOSIS — N83201 Unspecified ovarian cyst, right side: Secondary | ICD-10-CM

## 2014-12-02 DIAGNOSIS — R1030 Lower abdominal pain, unspecified: Secondary | ICD-10-CM | POA: Insufficient documentation

## 2014-12-02 LAB — URINE MICROSCOPIC-ADD ON

## 2014-12-02 LAB — URINALYSIS, ROUTINE W REFLEX MICROSCOPIC
BILIRUBIN URINE: NEGATIVE
Glucose, UA: NEGATIVE mg/dL
Ketones, ur: NEGATIVE mg/dL
Leukocytes, UA: NEGATIVE
NITRITE: NEGATIVE
PROTEIN: NEGATIVE mg/dL
Specific Gravity, Urine: 1.025 (ref 1.005–1.030)
Urobilinogen, UA: 1 mg/dL (ref 0.0–1.0)
pH: 6.5 (ref 5.0–8.0)

## 2014-12-02 LAB — POCT PREGNANCY, URINE: Preg Test, Ur: NEGATIVE

## 2014-12-02 MED ORDER — KETOROLAC TROMETHAMINE 60 MG/2ML IM SOLN
60.0000 mg | Freq: Once | INTRAMUSCULAR | Status: AC
  Administered 2014-12-02: 60 mg via INTRAMUSCULAR
  Filled 2014-12-02: qty 2

## 2014-12-02 MED ORDER — IBUPROFEN 600 MG PO TABS: 600.0000 mg | ORAL_TABLET | Freq: Four times a day (QID) | ORAL | Status: DC | PRN

## 2014-12-02 NOTE — MAU Provider Note (Signed)
History     CSN: [id number]  Arrival date and time: 12/02/14 1536   None     Chief Complaint  Patient presents with  . Vaginal Bleeding   HPI Sonia Allen is 29 y.o. [id number] Unknown weeks presenting for evaluation of  Persistent vaginal bleeding after her period.  LMP 11/07/14.  Describes as spotting daily.  Lower abdominal pain X 10 days.  Worse at night rating 9/10.  Currently 3-4/10.  She has not taken anything for pain.  She was seen 2 days ago at [facility] they instructed her to come to the clinic.  It was closed so she came to MAU.  Had cultures and blood work done at [facility].  No sexually activity X 6 weeks.     Past Medical History  Diagnosis Date  . Gonorrhea   . Chlamydia   . Arrhythmia   . Hx of migraine headaches   . Headache(784.0)   . Anemia     first and sec pregnancies  . Fibroid   . Leiomyoma of uterus in pregnancy   . Hemorrhoids   . Gestational diabetes     diet controlled    Past Surgical History  Procedure Laterality Date  . Wisdom tooth extraction    . Induced abortion  March  2 ,2013    Family History  Problem Relation Age of Onset  . Other Neg Hx   . Alcohol abuse Neg Hx   . Arthritis Neg Hx   . Asthma Neg Hx   . Birth defects Neg Hx   . Cancer Neg Hx   . COPD Neg Hx   . Depression Neg Hx   . Diabetes Neg Hx   . Drug abuse Neg Hx   . Early death Neg Hx   . Heart disease Neg Hx   . Hearing loss Neg Hx   . Hyperlipidemia Neg Hx   . Hypertension Neg Hx   . Kidney disease Neg Hx   . Learning disabilities Neg Hx   . Mental illness Neg Hx   . Mental retardation Neg Hx   . Miscarriages / Stillbirths Neg Hx   . Stroke Neg Hx   . Vision loss Neg Hx     History  Substance Use Topics  . Smoking status: Never Smoker   . Smokeless tobacco: Never Used  . Alcohol Use: Yes     Comment: occasional    Allergies: No Known Allergies  Facility-administered medications prior to admission  Medication Dose Route Frequency Provider Last Rate Last Dose   . Tdap (BOOSTRIX) injection 0.5 mL  0.5 mL Intramuscular Once Truett Mainland, DO       Prescriptions prior to admission  Medication Sig Dispense Refill Last Dose  . ibuprofen (ADVIL,MOTRIN) 600 MG tablet Take 1 tablet (600 mg total) by mouth every 6 (six) hours. (Patient taking differently: Take 600 mg by mouth every 6 (six) hours as needed for mild pain. ) 30 tablet 0 Past Month at Unknown time  . loperamide (IMODIUM A-D) 2 MG tablet 2 now and one hourly prn diarrhea.  Max 8 tabs in 24 hours (Patient not taking: Reported on 11/30/2014) 30 tablet 0 Completed Course at Unknown time  . ondansetron (ZOFRAN-ODT) 8 MG disintegrating tablet Take 1 tablet (8 mg total) by mouth every 8 (eight) hours as needed for nausea. (Patient not taking: Reported on 11/30/2014) 30 tablet 0 Completed Course at Unknown time    Review of Systems  Constitutional: Negative for fever and chills.  Gastrointestinal: Positive for abdominal pain. Negative for nausea and vomiting.  Genitourinary: Negative for dysuria, urgency, frequency and hematuria.       Positive for vaginal bleeding.  Neurological: Negative for headaches.   Physical Exam   Blood pressure 109/60, pulse 67, temperature 98.3 F (36.8 C), temperature source Oral, resp. rate 16, height 5\' 2"  (1.575 m), weight 180 lb 2 oz (81.704 kg), last menstrual period 11/06/2014, not currently breastfeeding.  Physical Exam  Constitutional: She is oriented to person, place, and time. She appears well-developed and well-nourished. No distress.  HENT:  Head: Normocephalic.  Neck: Normal range of motion.  Cardiovascular: Normal rate.   Respiratory: Effort normal.  GI: Soft. She exhibits no distension and no mass. There is tenderness (lower abdominal tenderness). There is no rebound and no guarding.  Genitourinary: There is no tenderness, lesion or injury on the right labia. There is no tenderness, lesion or injury on the left labia. Uterus is not enlarged and not tender.  Cervix exhibits no motion tenderness, no discharge and no friability. Right adnexum displays tenderness. Right adnexum displays no mass and no fullness. Left adnexum displays tenderness. Left adnexum displays no mass and no fullness. There is bleeding (scant pink discharge) in the vagina. No tenderness in the vagina. No vaginal discharge found.  Neurological: She is alert and oriented to person, place, and time.  Skin: Skin is warm and dry.  Psychiatric: She has a normal mood and affect. Her behavior is normal.   Results for orders placed or performed during the hospital encounter of 12/02/14 (from the past 24 hour(s))  Urinalysis, Routine w reflex microscopic (not at [facility])     Status: Abnormal   Collection Time: 12/02/14  4:40 PM  Result Value Ref Range   Color, Urine YELLOW YELLOW   APPearance CLEAR CLEAR   Specific Gravity, Urine 1.025 1.005 - 1.030   pH 6.5 5.0 - 8.0   Glucose, UA NEGATIVE NEGATIVE mg/dL   Hgb urine dipstick MODERATE (A) NEGATIVE   Bilirubin Urine NEGATIVE NEGATIVE   Ketones, ur NEGATIVE NEGATIVE mg/dL   Protein, ur NEGATIVE NEGATIVE mg/dL   Urobilinogen, UA 1.0 0.0 - 1.0 mg/dL   Nitrite NEGATIVE NEGATIVE   Leukocytes, UA NEGATIVE NEGATIVE  Urine microscopic-add on     Status: None   Collection Time: 12/02/14  4:40 PM  Result Value Ref Range   Squamous Epithelial / LPF RARE RARE   RBC / HPF 3-6 <3 RBC/hpf   Bacteria, UA RARE RARE  Pregnancy, urine POC     Status: None   Collection Time: 12/02/14  4:48 PM  Result Value Ref Range   Preg Test, Ur NEGATIVE NEGATIVE   CBC at [facility] was wnl. US Transvaginal Non-ob  12/02/2014   CLINICAL DATA:  Abnormal uterine bleeding. Episode of heavy vaginal bleeding. Right-sided pelvic pain. Uterine fibroids. LMP 11/06/2014.  EXAM: TRANSABDOMINAL AND TRANSVAGINAL ULTRASOUND OF PELVIS  TECHNIQUE: Both transabdominal and transvaginal ultrasound examinations of the pelvis were performed. Transabdominal technique was performed for global  imaging of the pelvis including uterus, ovaries, adnexal regions, and pelvic cul-de-sac. It was necessary to proceed with endovaginal exam following the transabdominal exam to visualize the endometrium and ovaries.  COMPARISON:  05/27/2011  FINDINGS: Uterus  Measurements: 10.2 x 9.2 x 6.7 cm. A subserosal fibroid is seen in the right anterior corpus which shows peripheral calcification. This measures 5.0 x 4.2 x 4.2 cm, without significant change.  Endometrium  Thickness: 8 mm.  No focal abnormality visualized.  Right ovary  Measurements: 4.6 x 2.9 x 4.0 cm. A simple appearing cyst is seen which measures 4.1 x 2.8 x 3.4 cm, and has benign characteristics.  Left ovary  Measurements: 2.4 x 1.6 x 2.1 cm. Normal appearance/no adnexal mass.  Other findings  No free fluid.  IMPRESSION: 5.0 cm calcified subserosal fibroid in right anterior corpus, without significant change since prior exam.  4.1 cm right ovarian cyst with benign characteristics. No imaging followup required in a reproductive age female. This recommendation follows the consensus statement: Management of Asymptomatic Ovarian and Other Adnexal Cysts Imaged at [place]: Society of Radiologists in [place]. Radiology 2010; [id number].   Electronically Signed   By: Earle Gell M.D.   On: 12/02/2014 18:15   US Pelvis Complete  12/02/2014   CLINICAL DATA:  Abnormal uterine bleeding. Episode of heavy vaginal bleeding. Right-sided pelvic pain. Uterine fibroids. LMP 11/06/2014.  EXAM: TRANSABDOMINAL AND TRANSVAGINAL ULTRASOUND OF PELVIS  TECHNIQUE: Both transabdominal and transvaginal ultrasound examinations of the pelvis were performed. Transabdominal technique was performed for global imaging of the pelvis including uterus, ovaries, adnexal regions, and pelvic cul-de-sac. It was necessary to proceed with endovaginal exam following the transabdominal exam to visualize the endometrium and ovaries.  COMPARISON:  05/27/2011  FINDINGS:  Uterus  Measurements: 10.2 x 9.2 x 6.7 cm. A subserosal fibroid is seen in the right anterior corpus which shows peripheral calcification. This measures 5.0 x 4.2 x 4.2 cm, without significant change.  Endometrium  Thickness: 8 mm.  No focal abnormality visualized.  Right ovary  Measurements: 4.6 x 2.9 x 4.0 cm. A simple appearing cyst is seen which measures 4.1 x 2.8 x 3.4 cm, and has benign characteristics.  Left ovary  Measurements: 2.4 x 1.6 x 2.1 cm. Normal appearance/no adnexal mass.  Other findings  No free fluid.  IMPRESSION: 5.0 cm calcified subserosal fibroid in right anterior corpus, without significant change since prior exam.  4.1 cm right ovarian cyst with benign characteristics. No imaging followup required in a reproductive age female. This recommendation follows the consensus statement: Management of Asymptomatic Ovarian and Other Adnexal Cysts Imaged at [place]: Society of Radiologists in [place]. Radiology 2010; [phone].   Electronically Signed   By: Earle Gell M.D.   On: 12/02/2014 18:15   MAU Course  Procedures  MDM Exam Lab U/S Toradol 60mg  IM for pain 19:43  Patient is feeling much better after Toradol  Assessment and Plan  A:  Right ovarian cyst 4.1cm - benign appearing      5.0cm calcified subserousal fibroid right anterior corpus. -unchanged since last exam.   P:  Discussed findings with the patient.       Instructed not to take any additional NSAIDs until tomorrow.     Encouraged her to see her MD for contraception counseling if she doesn't not want pregnancy at this time.     Blake Vetrano,EVE M 12/02/2014, 5:09 PM

## 2014-12-02 NOTE — Discharge Instructions (Signed)
Ovarian Cyst °An ovarian cyst is a sac filled with fluid or blood. This sac is attached to the ovary. Some cysts go away on their own. Other cysts need treatment.  °HOME CARE  °· Only take medicine as told by your doctor. °· Follow up with your doctor as told. °· Get regular pelvic exams and Pap tests. °GET HELP IF: °· Your periods are late, not regular, or painful. °· You stop having periods. °· Your belly (abdominal) or pelvic pain does not go away. °· Your belly becomes large or puffy (swollen). °· You have a hard time peeing (totally emptying your bladder). °· You have pressure on your bladder. °· You have pain during sex. °· You feel fullness, pressure, or discomfort in your belly. °· You lose weight for no reason. °· You feel sick most of the time. °· You have a hard time pooping (constipation). °· You do not feel like eating. °· You develop pimples (acne). °· You have an increase in hair on your body and face. °· You are gaining weight for no reason. °· You think you are pregnant. °GET HELP RIGHT AWAY IF:  °· Your belly pain gets worse. °· You feel sick to your stomach (nauseous), and you throw up (vomit). °· You have a fever that comes on fast. °· You have belly pain while pooping (bowel movement). °· Your periods are heavier than usual. °MAKE SURE YOU:  °· Understand these instructions. °· Will watch your condition. °· Will get help right away if you are not doing well or get worse. °Document Released: 12/04/2007 Document Revised: 04/07/2013 Document Reviewed: 02/22/2013 °ExitCare® Patient Information ©2015 [employer]. This information is not intended to replace advice given to you by your health care provider. Make sure you discuss any questions you have with your health care provider. ° °

## 2014-12-02 NOTE — MAU Note (Signed)
Pt states was told to come to MAU for eval of irregular bleeding. Went to [facility], had bloodwork done. Was told we may do u/s to determine cause of bleeding. Less bleeding today.

## 2015-01-24 ENCOUNTER — Encounter (HOSPITAL_COMMUNITY): Payer: Self-pay | Admitting: *Deleted

## 2015-01-24 ENCOUNTER — Inpatient Hospital Stay (HOSPITAL_COMMUNITY)
Admission: AD | Admit: 2015-01-24 | Discharge: 2015-01-24 | Disposition: A | Discharge status: Home / Self Care | Payer: 59 | Source: Ambulatory Visit | Attending: Obstetrics and Gynecology | Admitting: Obstetrics and Gynecology

## 2015-01-24 DIAGNOSIS — R110 Nausea: Secondary | ICD-10-CM

## 2015-01-24 DIAGNOSIS — R42 Dizziness and giddiness: Secondary | ICD-10-CM | POA: Insufficient documentation

## 2015-01-24 LAB — CBC WITH DIFFERENTIAL/PLATELET
BASOS ABS: 0 10*3/uL (ref 0.0–0.1)
Basophils Relative: 0 % (ref 0–1)
EOS ABS: 0.2 10*3/uL (ref 0.0–0.7)
EOS PCT: 2 % (ref 0–5)
HCT: 32.8 % — ABNORMAL LOW (ref 36.0–46.0)
Hemoglobin: 10.7 g/dL — ABNORMAL LOW (ref 12.0–15.0)
Lymphocytes Relative: 33 % (ref 12–46)
Lymphs Abs: 2.5 10*3/uL (ref 0.7–4.0)
MCH: 28 pg (ref 26.0–34.0)
MCHC: 32.6 g/dL (ref 30.0–36.0)
MCV: 85.9 fL (ref 78.0–100.0)
Monocytes Absolute: 0.4 10*3/uL (ref 0.1–1.0)
Monocytes Relative: 5 % (ref 3–12)
NEUTROS PCT: 59 % (ref 43–77)
Neutro Abs: 4.5 10*3/uL (ref 1.7–7.7)
Platelets: 345 10*3/uL (ref 150–400)
RBC: 3.82 MIL/uL — ABNORMAL LOW (ref 3.87–5.11)
RDW: 13.7 % (ref 11.5–15.5)
WBC: 7.6 10*3/uL (ref 4.0–10.5)

## 2015-01-24 LAB — URINALYSIS, ROUTINE W REFLEX MICROSCOPIC
Bilirubin Urine: NEGATIVE
Glucose, UA: NEGATIVE mg/dL
Ketones, ur: NEGATIVE mg/dL
Leukocytes, UA: NEGATIVE
NITRITE: NEGATIVE
Protein, ur: NEGATIVE mg/dL
Specific Gravity, Urine: >1.03 — ABNORMAL HIGH (ref 1.005–1.030)
Urobilinogen, UA: 0.2 mg/dL (ref 0.0–1.0)
pH: 5.5 (ref 5.0–8.0)

## 2015-01-24 LAB — COMPREHENSIVE METABOLIC PANEL
ALBUMIN: 4.2 g/dL (ref 3.5–5.0)
ALK PHOS: 66 U/L (ref 38–126)
ALT: 14 U/L (ref 14–54)
AST: 14 U/L — AB (ref 15–41)
Anion gap: 3 — ABNORMAL LOW (ref 5–15)
BILIRUBIN TOTAL: 0.5 mg/dL (ref 0.3–1.2)
BUN: 17 mg/dL (ref 6–20)
CO2: 26 mmol/L (ref 22–32)
CREATININE: 0.76 mg/dL (ref 0.44–1.00)
Calcium: 9 mg/dL (ref 8.9–10.3)
Chloride: 105 mmol/L (ref 101–111)
GFR calc Af Amer: >60 mL/min (ref >60)
GFR calc non Af Amer: >60 mL/min (ref >60)
Glucose, Bld: 99 mg/dL (ref 65–99)
POTASSIUM: 4 mmol/L (ref 3.5–5.1)
SODIUM: 134 mmol/L — AB (ref 135–145)
TOTAL PROTEIN: 7.4 g/dL (ref 6.5–8.1)

## 2015-01-24 LAB — URINE MICROSCOPIC-ADD ON

## 2015-01-24 LAB — POCT PREGNANCY, URINE: Preg Test, Ur: NEGATIVE

## 2015-01-24 MED ORDER — ONDANSETRON 8 MG PO TBDP
8.0000 mg | ORAL_TABLET | Freq: Once | ORAL | Status: AC
  Administered 2015-01-24: 8 mg via ORAL
  Filled 2015-01-24: qty 1

## 2015-01-24 MED ORDER — ONDANSETRON HCL 4 MG PO TABS: 4.0000 mg | ORAL_TABLET | Freq: Four times a day (QID) | ORAL | Status: DC

## 2015-01-24 NOTE — Discharge Instructions (Signed)
Nausea, Adult Nausea means you feel sick to your stomach or need to throw up (vomit). It may be a sign of a more serious problem. If nausea gets worse, you may throw up. If you throw up a lot, you may lose too much body fluid (dehydration). HOME CARE   Get plenty of rest.  Ask your doctor how to replace body fluid losses (rehydrate).  Eat small amounts of food. Sip liquids more often.  Take all medicines as told by your doctor. GET HELP RIGHT AWAY IF:  You have a fever.  You pass out (faint).  You keep throwing up or have blood in your throw up.  You are very weak, have dry lips or a dry mouth, or you are very thirsty (dehydrated).  You have dark or bloody poop (stool).  You have very bad chest or belly (abdominal) pain.  You do not get better after 2 days, or you get worse.  You have a headache. MAKE SURE YOU:  Understand these instructions.  Will watch your condition.  Will get help right away if you are not doing well or get worse. Document Released: 06/06/2011 Document Revised: 09/09/2011 Document Reviewed: 06/06/2011 [facility] Patient Information 2015 [place], [place]. This information is not intended to replace advice given to you by your health care provider. Make sure you discuss any questions you have with your health care provider.

## 2015-01-24 NOTE — MAU Provider Note (Signed)
History     CSN: [id number]  Arrival date and time: 01/24/15 0865   First Provider Initiated Contact with Patient 01/24/15 2032      No chief complaint on file.  HPI  [name] is a 29 y.o. [id number] who presents to MAU today with complaint of nausea x 2-3 days. The patient also states associated weakness and dizziness. She denies vomiting, diarrhea, constipation, vaginal bleeding, discharge, UTI symptoms fever, heartburn, change in diet or sick contacts. She does endorse occasional mild diffuse abdominal pain. She has not taken anything for pain or nausea.   OB History    Gravida Para Term Preterm AB TAB SAB Ectopic Multiple Living   5 4 4  0 1 1 0 0 0 4      Past Medical History  Diagnosis Date  . Gonorrhea   . Chlamydia   . Arrhythmia   . Hx of migraine headaches   . Headache(784.0)   . Anemia     first and sec pregnancies  . Fibroid   . Leiomyoma of uterus in pregnancy   . Hemorrhoids   . Gestational diabetes     diet controlled    Past Surgical History  Procedure Laterality Date  . Wisdom tooth extraction    . Induced abortion  March  2 ,2013    Family History  Problem Relation Age of Onset  . Other Neg Hx   . Alcohol abuse Neg Hx   . Arthritis Neg Hx   . Asthma Neg Hx   . Birth defects Neg Hx   . Cancer Neg Hx   . COPD Neg Hx   . Depression Neg Hx   . Diabetes Neg Hx   . Drug abuse Neg Hx   . Early death Neg Hx   . Heart disease Neg Hx   . Hearing loss Neg Hx   . Hyperlipidemia Neg Hx   . Hypertension Neg Hx   . Kidney disease Neg Hx   . Learning disabilities Neg Hx   . Mental illness Neg Hx   . Mental retardation Neg Hx   . Miscarriages / Stillbirths Neg Hx   . Stroke Neg Hx   . Vision loss Neg Hx     History  Substance Use Topics  . Smoking status: Never Smoker   . Smokeless tobacco: Never Used  . Alcohol Use: Yes     Comment: occasional    Allergies: No Known Allergies  Facility-administered medications prior to admission   Medication Dose Route Frequency Provider Last Rate Last Dose  . Tdap (BOOSTRIX) injection 0.5 mL  0.5 mL Intramuscular Once Truett Mainland, DO       Prescriptions prior to admission  Medication Sig Dispense Refill Last Dose  . ibuprofen (ADVIL,MOTRIN) 600 MG tablet Take 1 tablet (600 mg total) by mouth every 6 (six) hours as needed. 30 tablet 0 Past Month at Unknown time    Review of Systems  Constitutional: Negative for fever and malaise/fatigue.  Gastrointestinal: Positive for nausea. Negative for vomiting, abdominal pain, diarrhea and constipation.  Genitourinary: Negative for dysuria, urgency and frequency.       Neg - vaginal bleeding, discharge   Physical Exam   Blood pressure 111/63, pulse 89, temperature 98.6 F (37 C), temperature source Oral, resp. rate 20, height 5\' 2"  (1.575 m), weight 179 lb 8 oz (81.421 kg), last menstrual period 01/07/2015, not currently breastfeeding.  Physical Exam  Nursing note and vitals reviewed. Constitutional: She is oriented to  person, place, and time. She appears well-developed and well-nourished. No distress.  HENT:  Head: Normocephalic and atraumatic.  Cardiovascular: Normal rate.   Respiratory: Effort normal.  GI: Soft. Bowel sounds are normal. She exhibits no distension and no mass. There is no tenderness. There is no rebound and no guarding.  Neurological: She is alert and oriented to person, place, and time.  Skin: Skin is warm and dry. No erythema.  Psychiatric: She has a normal mood and affect.   Results for orders placed or performed during the hospital encounter of 01/24/15 (from the past 24 hour(s))  Urinalysis, Routine w reflex microscopic (not at [facility])     Status: Abnormal   Collection Time: 01/24/15  8:11 PM  Result Value Ref Range   Color, Urine YELLOW YELLOW   APPearance CLEAR CLEAR   Specific Gravity, Urine >1.030 (H) 1.005 - 1.030   pH 5.5 5.0 - 8.0   Glucose, UA NEGATIVE NEGATIVE mg/dL   Hgb urine dipstick SMALL  (A) NEGATIVE   Bilirubin Urine NEGATIVE NEGATIVE   Ketones, ur NEGATIVE NEGATIVE mg/dL   Protein, ur NEGATIVE NEGATIVE mg/dL   Urobilinogen, UA 0.2 0.0 - 1.0 mg/dL   Nitrite NEGATIVE NEGATIVE   Leukocytes, UA NEGATIVE NEGATIVE  Urine microscopic-add on     Status: None   Collection Time: 01/24/15  8:11 PM  Result Value Ref Range   Squamous Epithelial / LPF RARE RARE   WBC, UA 0-2 <3 WBC/hpf   RBC / HPF 0-2 <3 RBC/hpf   Bacteria, UA RARE RARE  Pregnancy, urine POC     Status: None   Collection Time: 01/24/15  8:20 PM  Result Value Ref Range   Preg Test, Ur NEGATIVE NEGATIVE  CBC with Differential/Platelet     Status: Abnormal   Collection Time: 01/24/15  8:41 PM  Result Value Ref Range   WBC 7.6 4.0 - 10.5 K/uL   RBC 3.82 (L) 3.87 - 5.11 MIL/uL   Hemoglobin 10.7 (L) 12.0 - 15.0 g/dL   HCT 32.8 (L) 36.0 - 46.0 %   MCV 85.9 78.0 - 100.0 fL   MCH 28.0 26.0 - 34.0 pg   MCHC 32.6 30.0 - 36.0 g/dL   RDW 13.7 11.5 - 15.5 %   Platelets 345 150 - 400 K/uL   Neutrophils Relative % 59 43 - 77 %   Neutro Abs 4.5 1.7 - 7.7 K/uL   Lymphocytes Relative 33 12 - 46 %   Lymphs Abs 2.5 0.7 - 4.0 K/uL   Monocytes Relative 5 3 - 12 %   Monocytes Absolute 0.4 0.1 - 1.0 K/uL   Eosinophils Relative 2 0 - 5 %   Eosinophils Absolute 0.2 0.0 - 0.7 K/uL   Basophils Relative 0 0 - 1 %   Basophils Absolute 0.0 0.0 - 0.1 K/uL  Comprehensive metabolic panel     Status: Abnormal   Collection Time: 01/24/15  8:41 PM  Result Value Ref Range   Sodium 134 (L) 135 - 145 mmol/L   Potassium 4.0 3.5 - 5.1 mmol/L   Chloride 105 101 - 111 mmol/L   CO2 26 22 - 32 mmol/L   Glucose, Bld 99 65 - 99 mg/dL   BUN 17 6 - 20 mg/dL   Creatinine, Ser 0.76 0.44 - 1.00 mg/dL   Calcium 9.0 8.9 - 10.3 mg/dL   Total Protein 7.4 6.5 - 8.1 g/dL   Albumin 4.2 3.5 - 5.0 g/dL   AST 14 (L) 15 - 41 U/L  ALT 14 14 - 54 U/L   Alkaline Phosphatase 66 38 - 126 U/L   Total Bilirubin 0.5 0.3 - 1.2 mg/dL   GFR calc non Af Amer >60  >60 mL/min   GFR calc Af Amer >60 >60 mL/min   Anion gap 3 (L) 5 - 15    MAU Course  Procedures None  MDM UPT - negative UA, CBC and CMP today Zofran ODT given Patient reports some improvement in nausea. No emesis while in MAU.  Patient is driving today, unable to trial Phenergan for better relief.   Assessment and Plan  A: Nausea without vomiting  P: Discharge home Rx for Zofran given to patient Warning signs for worsening condition discussed Patient advised to follow-up with PCP of choice Patient may return to MAU as needed or if her condition were to change or worsen  Luvenia Redden, PA-C  01/24/2015, 9:33 PM

## 2015-01-24 NOTE — MAU Note (Signed)
PT  SAYS HER  LMP  WAS 7-9-  LASTED    1-2  WEEKS.     WHEN CYCLE  WAS FINISHED   SHE HAD 2 DAYS  OF  BLEEDING .    NO BLEEDING  TODAY      NO BIRTH CONTROL.  LAST SEX-  7- 22.    NO HPT.   SAYS SHE STARTED  FEELING  WEAK AND   DIZZY AND  NAUSEATED  YESTERDAY.     NO VOMITING.

## 2015-02-26 ENCOUNTER — Emergency Department (HOSPITAL_COMMUNITY)
Admission: EM | Admit: 2015-02-26 | Discharge: 2015-02-26 | Disposition: A | Discharge status: Home / Self Care | Payer: 59 | Attending: Emergency Medicine | Admitting: Emergency Medicine

## 2015-02-26 ENCOUNTER — Emergency Department (HOSPITAL_COMMUNITY): Payer: 59

## 2015-02-26 ENCOUNTER — Encounter (HOSPITAL_COMMUNITY): Payer: Self-pay

## 2015-02-26 DIAGNOSIS — Z3202 Encounter for pregnancy test, result negative: Secondary | ICD-10-CM | POA: Insufficient documentation

## 2015-02-26 DIAGNOSIS — Z8719 Personal history of other diseases of the digestive system: Secondary | ICD-10-CM | POA: Insufficient documentation

## 2015-02-26 DIAGNOSIS — Z8679 Personal history of other diseases of the circulatory system: Secondary | ICD-10-CM | POA: Diagnosis not present

## 2015-02-26 DIAGNOSIS — Z8632 Personal history of gestational diabetes: Secondary | ICD-10-CM | POA: Diagnosis not present

## 2015-02-26 DIAGNOSIS — N938 Other specified abnormal uterine and vaginal bleeding: Secondary | ICD-10-CM | POA: Insufficient documentation

## 2015-02-26 DIAGNOSIS — Z8619 Personal history of other infectious and parasitic diseases: Secondary | ICD-10-CM | POA: Insufficient documentation

## 2015-02-26 DIAGNOSIS — Z862 Personal history of diseases of the blood and blood-forming organs and certain disorders involving the immune mechanism: Secondary | ICD-10-CM | POA: Diagnosis not present

## 2015-02-26 DIAGNOSIS — Z86018 Personal history of other benign neoplasm: Secondary | ICD-10-CM | POA: Insufficient documentation

## 2015-02-26 DIAGNOSIS — R102 Pelvic and perineal pain: Secondary | ICD-10-CM

## 2015-02-26 LAB — BASIC METABOLIC PANEL
Anion gap: 7 (ref 5–15)
BUN: 18 mg/dL (ref 6–20)
CHLORIDE: 108 mmol/L (ref 101–111)
CO2: 26 mmol/L (ref 22–32)
CREATININE: 0.61 mg/dL (ref 0.44–1.00)
Calcium: 9.3 mg/dL (ref 8.9–10.3)
Glucose, Bld: 92 mg/dL (ref 65–99)
POTASSIUM: 3.8 mmol/L (ref 3.5–5.1)
SODIUM: 141 mmol/L (ref 135–145)

## 2015-02-26 LAB — WET PREP, GENITAL
CLUE CELLS WET PREP: NONE SEEN
Trich, Wet Prep: NONE SEEN
WBC WET PREP: NONE SEEN
YEAST WET PREP: NONE SEEN

## 2015-02-26 LAB — I-STAT BETA HCG BLOOD, ED (MC, WL, AP ONLY)

## 2015-02-26 LAB — CBC WITH DIFFERENTIAL/PLATELET
BASOS ABS: 0 10*3/uL (ref 0.0–0.1)
BASOS PCT: 1 % (ref 0–1)
EOS ABS: 0.1 10*3/uL (ref 0.0–0.7)
Eosinophils Relative: 2 % (ref 0–5)
HCT: 35 % — ABNORMAL LOW (ref 36.0–46.0)
HEMOGLOBIN: 11.3 g/dL — AB (ref 12.0–15.0)
Lymphocytes Relative: 33 % (ref 12–46)
Lymphs Abs: 2 10*3/uL (ref 0.7–4.0)
MCH: 28.1 pg (ref 26.0–34.0)
MCHC: 32.3 g/dL (ref 30.0–36.0)
MCV: 87.1 fL (ref 78.0–100.0)
Monocytes Absolute: 0.3 10*3/uL (ref 0.1–1.0)
Monocytes Relative: 5 % (ref 3–12)
NEUTROS PCT: 59 % (ref 43–77)
Neutro Abs: 3.5 10*3/uL (ref 1.7–7.7)
Platelets: 370 10*3/uL (ref 150–400)
RBC: 4.02 MIL/uL (ref 3.87–5.11)
RDW: 13.3 % (ref 11.5–15.5)
WBC: 5.9 10*3/uL (ref 4.0–10.5)

## 2015-02-26 LAB — HCG, SERUM, QUALITATIVE: PREG SERUM: NEGATIVE

## 2015-02-26 MED ORDER — SODIUM CHLORIDE 0.9 % IV BOLUS (SEPSIS)
1000.0000 mL | Freq: Once | INTRAVENOUS | Status: AC
  Administered 2015-02-26: 1000 mL via INTRAVENOUS

## 2015-02-26 MED ORDER — KETOROLAC TROMETHAMINE 30 MG/ML IJ SOLN
30.0000 mg | Freq: Once | INTRAMUSCULAR | Status: AC
  Administered 2015-02-26: 30 mg via INTRAVENOUS
  Filled 2015-02-26: qty 1

## 2015-02-26 NOTE — ED Provider Notes (Signed)
CSN: [id number]     Arrival date & time 02/26/15  1632 History   First MD Initiated Contact with Patient 02/26/15 1723     Chief Complaint  Patient presents with  . Vaginal Bleeding     (Consider location/radiation/quality/duration/timing/severity/associated sxs/prior Treatment) HPI  29 year old female with history of uterine fibroids who presents with vaginal bleeding. Started menses 8/11 this month, and continues to have vaginal bleeding today.Had depo shot 03/2014, but has not had repeat depo shot since. Initially started having normal periods in February 2016 and initially had associated menorrhagia but had improved by March and April, but started worsening again in afterwards. This time has been the worse. Has been having low abdominal pain. Denies vaginal discharge, dysuria, urinary frequency, melena, hematochezia, fever, chills, vomiting, diarrhea.   Past Medical History  Diagnosis Date  . Gonorrhea   . Chlamydia   . Arrhythmia   . Hx of migraine headaches   . Headache(784.0)   . Anemia     first and sec pregnancies  . Fibroid   . Leiomyoma of uterus in pregnancy   . Hemorrhoids   . Gestational diabetes     diet controlled   Past Surgical History  Procedure Laterality Date  . Wisdom tooth extraction    . Induced abortion  March  2 ,2013   Family History  Problem Relation Age of Onset  . Other Neg Hx   . Alcohol abuse Neg Hx   . Arthritis Neg Hx   . Asthma Neg Hx   . Birth defects Neg Hx   . Cancer Neg Hx   . COPD Neg Hx   . Depression Neg Hx   . Diabetes Neg Hx   . Drug abuse Neg Hx   . Early death Neg Hx   . Heart disease Neg Hx   . Hearing loss Neg Hx   . Hyperlipidemia Neg Hx   . Hypertension Neg Hx   . Kidney disease Neg Hx   . Learning disabilities Neg Hx   . Mental illness Neg Hx   . Mental retardation Neg Hx   . Miscarriages / Stillbirths Neg Hx   . Stroke Neg Hx   . Vision loss Neg Hx    Social History  Substance Use Topics  . Smoking status:  Never Smoker   . Smokeless tobacco: Never Used  . Alcohol Use: Yes     Comment: occasional   OB History    Gravida Para Term Preterm AB TAB SAB Ectopic Multiple Living   5 4 4  0 1 1 0 0 0 4     Review of Systems 10/14 systems reviewed and are negative other than those stated in the HPI    Allergies  Review of patient's allergies indicates no known allergies.  Home Medications   Prior to Admission medications   Medication Sig Start Date End Date Taking? Authorizing Provider  ibuprofen (ADVIL,MOTRIN) 600 MG tablet Take 1 tablet (600 mg total) by mouth every 6 (six) hours as needed. Patient not taking: Reported on 02/26/2015 12/02/14   Tresea Mall, CNM  ondansetron (ZOFRAN) 4 MG tablet Take 1 tablet (4 mg total) by mouth every 6 (six) hours. Patient not taking: Reported on 02/26/2015 01/24/15   Luvenia Redden, PA-C   BP 104/73 mmHg  Pulse 72  Temp(Src) 98.1 F (36.7 C) (Oral)  Resp 16  SpO2 100%  LMP 02/09/2015 Physical Exam Physical Exam  Nursing note and vitals reviewed. Constitutional: Well developed, well  nourished, non-toxic, and in no acute distress Head: Normocephalic and atraumatic.  Mouth/Throat: Oropharynx is clear and moist.  Neck: Normal range of motion. Neck supple.  Cardiovascular: Normal rate and regular rhythm.   Pulmonary/Chest: Effort normal and breath sounds normal.  Abdominal: Soft. There is suprapubic tenderness. There is no rebound and no guarding.  Musculoskeletal: Normal range of motion.  Neurological: Alert, no facial droop, fluent speech, moves all extremities symmetrically Skin: Skin is warm and dry.  Psychiatric: Cooperative  Pelvic: Normal external genitalia. Normal internal genitalia. No discharge. Scant blood within the vagina without active bleeding from the cervix  No cervical motion tenderness. No adnexal masses or tenderness. Tenderness over suprapubic abdomen.  ED Course  Procedures (including critical care time) Labs Review Labs  Reviewed  CBC WITH DIFFERENTIAL/PLATELET - Abnormal; Notable for the following:    Hemoglobin 11.3 (*)    HCT 35.0 (*)    All other components within normal limits  WET PREP, GENITAL  BASIC METABOLIC PANEL  HCG, SERUM, QUALITATIVE  URINALYSIS, ROUTINE W REFLEX MICROSCOPIC (NOT AT [facility])  I-STAT BETA HCG BLOOD, ED ([facility], [facility], [facility] ONLY)    Imaging Review US Transvaginal Non-ob  02/26/2015   CLINICAL DATA:  29 year old with vaginal bleeding for 17 days. History of fibroids. Initial encounter.  EXAM: TRANSABDOMINAL AND TRANSVAGINAL ULTRASOUND OF PELVIS  TECHNIQUE: Both transabdominal and transvaginal ultrasound examinations of the pelvis were performed. Transabdominal technique was performed for global imaging of the pelvis including uterus, ovaries, adnexal regions, and pelvic cul-de-sac. It was necessary to proceed with endovaginal exam following the transabdominal exam to visualize the endometrium and ovaries to better advantage.  COMPARISON:  Pelvic ultrasound 12/02/2014  FINDINGS: Uterus  Measurements: 9.5 x 5.8 x 6.1 cm. A peripherally calcified right-sided fibroid is again noted, measuring approximately 3.5 x 2.8 x 3.8 cm. The dimensions appear underestimated based on the measurements provided previously. No other focal myometrial abnormalities identified. The myometrium is heterogeneous.  Endometrium  Thickness: 7 mm. The endometrium is not well-defined. No significant fluid identified within the endometrial canal.  Right ovary  Measurements: 5.4 x 3.3 x 4.6 cm. There is a simple appearing cyst measuring 4.4 x 3.2 x 3.8 cm. There is surrounding blood flow.  Left ovary  Measurements: 2.0 x 2.8 x 1.6 cm. Normal appearance/no adnexal mass. Normal blood flow with color Doppler.  Other findings  Trace free pelvic fluid, within physiologic limits.  IMPRESSION: 1. No significant change seen from prior study of less than 3 months ago. No explanation for heavy menses identified. The endometrium does not appear  significantly thickened. 2. Myometrial heterogeneity with dominant peripherally calcified right-sided fibroid. 3. Simple appearing right ovarian cyst is slightly larger than on the prior study. It is uncertain as to whether this is the same cyst or a new finding. Ordinarily, a simple cyst in a patient of this age would not require any further imaging. If this is the same lesion however, continued follow-up may be warranted.   Electronically Signed   By: Richardean Sale M.D.   On: 02/26/2015 18:55   US Pelvis Complete  02/26/2015   CLINICAL DATA:  29 year old with vaginal bleeding for 17 days. History of fibroids. Initial encounter.  EXAM: TRANSABDOMINAL AND TRANSVAGINAL ULTRASOUND OF PELVIS  TECHNIQUE: Both transabdominal and transvaginal ultrasound examinations of the pelvis were performed. Transabdominal technique was performed for global imaging of the pelvis including uterus, ovaries, adnexal regions, and pelvic cul-de-sac. It was necessary to proceed with endovaginal exam following the transabdominal  exam to visualize the endometrium and ovaries to better advantage.  COMPARISON:  Pelvic ultrasound 12/02/2014  FINDINGS: Uterus  Measurements: 9.5 x 5.8 x 6.1 cm. A peripherally calcified right-sided fibroid is again noted, measuring approximately 3.5 x 2.8 x 3.8 cm. The dimensions appear underestimated based on the measurements provided previously. No other focal myometrial abnormalities identified. The myometrium is heterogeneous.  Endometrium  Thickness: 7 mm. The endometrium is not well-defined. No significant fluid identified within the endometrial canal.  Right ovary  Measurements: 5.4 x 3.3 x 4.6 cm. There is a simple appearing cyst measuring 4.4 x 3.2 x 3.8 cm. There is surrounding blood flow.  Left ovary  Measurements: 2.0 x 2.8 x 1.6 cm. Normal appearance/no adnexal mass. Normal blood flow with color Doppler.  Other findings  Trace free pelvic fluid, within physiologic limits.  IMPRESSION: 1. No  significant change seen from prior study of less than 3 months ago. No explanation for heavy menses identified. The endometrium does not appear significantly thickened. 2. Myometrial heterogeneity with dominant peripherally calcified right-sided fibroid. 3. Simple appearing right ovarian cyst is slightly larger than on the prior study. It is uncertain as to whether this is the same cyst or a new finding. Ordinarily, a simple cyst in a patient of this age would not require any further imaging. If this is the same lesion however, continued follow-up may be warranted.   Electronically Signed   By: Richardean Sale M.D.   On: 02/26/2015 18:55   I have personally reviewed and evaluated these images and lab results as part of my medical decision-making.   EKG Interpretation None      MDM   Final diagnoses:  Pelvic pain in female  Dysfunctional uterine bleeding    29 year old female with history of uterine fibroids who presents with menorrhagia and suprapubic abdominal pain. She is nontoxic and in no acute distress on presentation. Vital signs are within normal limits. She is a soft and benign abdomen, with focal suprapubic tenderness to palpation. Blood work shows a baseline anemia with a hemoglobin of 11. Pelvic exam reveals scant blood within the vagina, no active bleeding from the cervix noted. Otherwise, exam is unremarkable. Pelvic ultrasound was performed, showing no evidence of fibroid, thickened endometrium, or any other etiology of her menorrhagia. She is given diagnosis of dysfunctional uterine bleeding, likely progesterone withdrawal in the setting of type of shot several months ago. She is given follow-up with OB/GYN as an outpatient. Strict return instructions are also reviewed. She expressed understanding of all discharge instructions for comfortable to plan of care.    Forde Dandy, MD 02/26/15 2242

## 2015-02-26 NOTE — ED Notes (Signed)
Patient transported to Ultrasound 

## 2015-02-26 NOTE — ED Notes (Signed)
Pt has had vaginal bleeding since 8/11.  Had depo shot last fall but has not had additional shot.  Pt has had normal periods since then but has also had cycles of prolonged bleeding. With abdominal cramping.  Pt denies sexual activity.  No change in urination or odor noted.  No n/v

## 2015-02-26 NOTE — ED Notes (Signed)
Patient unable to void at this time

## 2015-02-26 NOTE — Discharge Instructions (Signed)
Return without fail for worsening symptoms, including worsening pain, worsening bleeding including using more than one pad within an hour, or any other symptoms concerning to you.  Abnormal Uterine Bleeding Abnormal uterine bleeding can affect women at various stages in life, including teenagers, women in their reproductive years, pregnant women, and women who have reached menopause. Several kinds of uterine bleeding are considered abnormal, including:  Bleeding or spotting between periods.   Bleeding after sexual intercourse.   Bleeding that is heavier or more than normal.   Periods that last longer than usual.  Bleeding after menopause.  Many cases of abnormal uterine bleeding are minor and simple to treat, while others are more serious. Any type of abnormal bleeding should be evaluated by your health care provider. Treatment will depend on the cause of the bleeding. HOME CARE INSTRUCTIONS Monitor your condition for any changes. The following actions may help to alleviate any discomfort you are experiencing:  Avoid the use of tampons and douches as directed by your health care provider.  Change your pads frequently. You should get regular pelvic exams and Pap tests. Keep all follow-up appointments for diagnostic tests as directed by your health care provider.  SEEK MEDICAL CARE IF:   Your bleeding lasts more than 1 week.   You feel dizzy at times.  SEEK IMMEDIATE MEDICAL CARE IF:   You pass out.   You are changing pads every 15 to 30 minutes.   You have abdominal pain.  You have a fever.   You become sweaty or weak.   You are passing large blood clots from the vagina.   You start to feel nauseous and vomit. MAKE SURE YOU:   Understand these instructions.  Will watch your condition.  Will get help right away if you are not doing well or get worse. Document Released: 06/17/2005 Document Revised: 06/22/2013 Document Reviewed: 01/14/2013 [facility] Patient  Information 2015 [place], [place]. This information is not intended to replace advice given to you by your health care provider. Make sure you discuss any questions you have with your health care provider.

## 2015-02-26 NOTE — ED Notes (Signed)
Patient states she is still unable to void at this time

## 2015-06-18 ENCOUNTER — Ambulatory Visit (INDEPENDENT_AMBULATORY_CARE_PROVIDER_SITE_OTHER): Payer: 59 | Admitting: Family Medicine

## 2015-06-18 VITALS — BP 102/64 | HR 75 | Temp 98.1°F | Resp 18 | Ht 62.0 in | Wt 171.2 lb

## 2015-06-18 DIAGNOSIS — N939 Abnormal uterine and vaginal bleeding, unspecified: Secondary | ICD-10-CM | POA: Diagnosis not present

## 2015-06-18 DIAGNOSIS — R300 Dysuria: Secondary | ICD-10-CM

## 2015-06-18 DIAGNOSIS — R1030 Lower abdominal pain, unspecified: Secondary | ICD-10-CM

## 2015-06-18 DIAGNOSIS — Z7251 High risk heterosexual behavior: Secondary | ICD-10-CM | POA: Diagnosis not present

## 2015-06-18 LAB — POC MICROSCOPIC URINALYSIS (UMFC)

## 2015-06-18 LAB — POCT URINALYSIS DIP (MANUAL ENTRY)
BILIRUBIN UA: NEGATIVE
BILIRUBIN UA: NEGATIVE
GLUCOSE UA: NEGATIVE
LEUKOCYTES UA: NEGATIVE
NITRITE UA: NEGATIVE
PH UA: 5.5
Spec Grav, UA: >=1.03
Urobilinogen, UA: 1

## 2015-06-18 LAB — POCT URINE PREGNANCY: PREG TEST UR: NEGATIVE

## 2015-06-18 NOTE — Patient Instructions (Addendum)
You should receive a call or letter about your lab results within the next week to 10 days.   Urinary  Testing in the office does not indicate an infection. As the burning had improved, we can wait to see if this continues to be improving in the next 3 or 4 days. If you have any worsening of burning with urination, worsening abdominal or pelvic pain, fever, or other worsening - return for recheck. Return to the clinic or go to the nearest emergency room if any of your symptoms worsen or new symptoms occur.  I do not see any concerning findings on your throat or in your ears. You possibly had a viral infection that should also continue to improve in the next 3-4 days. If you have any worsening sore throat, fever, or worsening ear pain, return for recheck.  Return to the clinic or go to the nearest emergency room if any of your symptoms worsen or new symptoms occur.   Dysuria Dysuria is pain or discomfort while urinating. The pain or discomfort may be felt in the tube that carries urine out of the bladder (urethra) or in the surrounding tissue of the genitals. The pain may also be felt in the groin area, lower abdomen, and lower back. You may have to urinate frequently or have the sudden feeling that you have to urinate (urgency). Dysuria can affect both men and women, but is more common in women. Dysuria can be caused by many different things, including:  Urinary tract infection in women.  Infection of the kidney or bladder.  Kidney stones or bladder stones.  Certain sexually transmitted infections (STIs), such as chlamydia.  Dehydration.  Inflammation of the vagina.  Use of certain medicines.  Use of certain soaps or scented products that cause irritation. HOME CARE INSTRUCTIONS Watch your dysuria for any changes. The following actions may help to reduce any discomfort you are feeling:  Drink enough fluid to keep your urine clear or pale yellow.  Empty your bladder often. Avoid holding  urine for long periods of time.  After a bowel movement or urination, women should cleanse from front to back, using each tissue only once.  Empty your bladder after sexual intercourse.  Take medicines only as directed by your health care provider.  If you were prescribed an antibiotic medicine, finish it all even if you start to feel better.  Avoid caffeine, tea, and alcohol. They can irritate the bladder and make dysuria worse. In men, alcohol may irritate the prostate.  Keep all follow-up visits as directed by your health care provider. This is important.  If you had any tests done to find the cause of dysuria, it is your responsibility to obtain your test results. Ask the lab or department performing the test when and how you will get your results. Talk with your health care provider if you have any questions about your results. SEEK MEDICAL CARE IF:  You develop pain in your back or sides.  You have a fever.  You have nausea or vomiting.  You have blood in your urine.  You are not urinating as often as you usually do. SEEK IMMEDIATE MEDICAL CARE IF:  You pain is severe and not relieved with medicines.  You are unable to hold down any fluids.  You or someone else notices a change in your mental function.  You have a rapid heartbeat at rest.  You have shaking or chills.  You feel extremely weak.   This information is  not intended to replace advice given to you by your health care provider. Make sure you discuss any questions you have with your health care provider.   Document Released: 03/15/2004 Document Revised: 07/08/2014 Document Reviewed: 02/10/2014 Elsevier Interactive Patient Education 2016 [employer]   Sore Throat A sore throat is pain, burning, irritation, or scratchiness of the throat. There is often pain or tenderness when swallowing or talking. A sore throat may be accompanied by other symptoms, such as coughing, sneezing, fever, and swollen neck  glands. A sore throat is often the first sign of another sickness, such as a cold, flu, strep throat, or mononucleosis (commonly known as mono). Most sore throats go away without medical treatment. CAUSES  The most common causes of a sore throat include:  A viral infection, such as a cold, flu, or mono.  A bacterial infection, such as strep throat, tonsillitis, or whooping cough.  Seasonal allergies.  Dryness in the air.  Irritants, such as smoke or pollution.  Gastroesophageal reflux disease (GERD). HOME CARE INSTRUCTIONS   Only take over-the-counter medicines as directed by your caregiver.  Drink enough fluids to keep your urine clear or pale yellow.  Rest as needed.  Try using throat sprays, lozenges, or sucking on hard candy to ease any pain (if older than 4 years or as directed).  Sip warm liquids, such as broth, herbal tea, or warm water with honey to relieve pain temporarily. You may also eat or drink cold or frozen liquids such as frozen ice pops.  Gargle with salt water (mix 1 tsp salt with 8 oz of water).  Do not smoke and avoid secondhand smoke.  Put a cool-mist humidifier in your bedroom at night to moisten the air. You can also turn on a hot shower and sit in the bathroom with the door closed for 5-10 minutes. SEEK IMMEDIATE MEDICAL CARE IF:  You have difficulty breathing.  You are unable to swallow fluids, soft foods, or your saliva.  You have increased swelling in the throat.  Your sore throat does not get better in 7 days.  You have nausea and vomiting.  You have a fever or persistent symptoms for more than 2-3 days.  You have a fever and your symptoms suddenly get worse. MAKE SURE YOU:   Understand these instructions.  Will watch your condition.  Will get help right away if you are not doing well or get worse.   This information is not intended to replace advice given to you by your health care provider. Make sure you discuss any questions you  have with your health care provider.   Document Released: 07/25/2004 Document Revised: 07/08/2014 Document Reviewed: 02/23/2012 Elsevier Interactive Patient Education [employer].

## 2015-06-18 NOTE — Progress Notes (Addendum)
Subjective:  By signing my name below, I, [name], attest that this documentation has been prepared under the direction and in the presence of [name], MD.  Electronically Signed: [name], ED Scribe. 06/18/2015. 3:44 PM.   Patient ID: Sonia Allen, female    DOB: May 02, 1986, 29 y.o.   MRN: [id number]  HPI  Chief Complaint  Patient presents with  . Dysuria    last week  . Vaginal Discharge   HPI Comments: Sonia Allen is a 29 y.o. female who presents to the [facility] complaining of dysuria that began 1 week ago. She reports associated right lower abdominal pain and white vaginal discharge 2 days ago. She reports menses started today and has been normal. She denies chance of pregnancy. She had unwanted, unprotected intercourse 3 weeks ago with a female. She states she had passed out after drinking at the time of intercourse. She was not seen in the emergency room. She states she is able to identify female and his whereabouts. She has not had been tested for STI in a while. She denies fever, chills, nausea, emesis, vaginal pain, rash, and blisters .   Later in visit - states burning had resolved past few days and only slight burning today at end of urination.  Is on start of menses  At end of visit she also noted some sore throat and slight earache since last week.  Min soreness in throat, but no fever. No strep throat contacts. Eating and drinking ok.   Patient Active Problem List   Diagnosis Date Noted  . Pregnancy 03/12/2014  . Marginal insertion of umbilical cord [id number]  . NSVD (normal spontaneous vaginal delivery) 03/12/2014  . Gestational diabetes mellitus, antepartum 01/17/2014  . Uterine fibroids affecting pregnancy 10/14/2012  . Anemia 10/14/2012  . HEADACHE 02/18/2007   Past Medical History  Diagnosis Date  . Gonorrhea   . Chlamydia   . Arrhythmia   . Hx of migraine headaches   . Headache(784.0)   . Anemia     first and sec  pregnancies  . Fibroid   . Leiomyoma of uterus in pregnancy   . Hemorrhoids   . Gestational diabetes     diet controlled   Past Surgical History  Procedure Laterality Date  . Wisdom tooth extraction    . Induced abortion  March  2 ,2013   No Known Allergies Prior to Admission medications   Medication Sig Start Date End Date Taking? Authorizing Provider  ibuprofen (ADVIL,MOTRIN) 600 MG tablet Take 1 tablet (600 mg total) by mouth every 6 (six) hours as needed. Patient not taking: Reported on 02/26/2015 12/02/14   Tresea Mall, CNM  ondansetron (ZOFRAN) 4 MG tablet Take 1 tablet (4 mg total) by mouth every 6 (six) hours. Patient not taking: Reported on 02/26/2015 01/24/15   Luvenia Redden, PA-C   Social History   Social History  . Marital Status: Single    Spouse Name: N/A  . Number of Children: N/A  . Years of Education: N/A   Occupational History  . Not on file.   Social History Main Topics  . Smoking status: Never Smoker   . Smokeless tobacco: Never Used  . Alcohol Use: Yes     Comment: occasional  . Drug Use: No  . Sexual Activity: Yes    Birth Control/ Protection: None   Other Topics Concern  . Not on file   Social History Narrative   Review of Systems  Constitutional: Negative for fever and chills.  Gastrointestinal: Positive for abdominal pain. Negative for nausea and vomiting.  Genitourinary: Positive for dysuria and vaginal discharge. Negative for difficulty urinating, vaginal pain and menstrual problem.  Skin: Negative for rash.   Objective:   Physical Exam  Constitutional: She is oriented to person, place, and time. She appears well-developed and well-nourished. No distress.  HENT:  Head: Normocephalic and atraumatic.  Eyes: Conjunctivae and EOM are normal.  Neck: Neck supple.  Cardiovascular: Normal rate and normal heart sounds.  Exam reveals no friction rub.   No murmur heard. Pulmonary/Chest: Effort normal.  Abdominal: There is tenderness. There  is no rebound, no guarding, no CVA tenderness, no tenderness at McBurney's point and negative Murphy's sign.  minimal suprapubic tenderness greater thand right and left lower quadrant tenderness.   Musculoskeletal: Normal range of motion.  Neurological: She is alert and oriented to person, place, and time.  Skin: Skin is warm and dry.  Psychiatric: She has a normal mood and affect. Her behavior is normal.  Nursing note and vitals reviewed.  Filed Vitals:   06/18/15 1521  BP: 102/64  Pulse: 75  Temp: 98.1 F (36.7 C)  TempSrc: Oral  Resp: 18  Height: 5\' 2"  (1.575 m)  Weight: 171 lb 3.2 oz (77.656 kg)  SpO2: 98%   Results for orders placed or performed in visit on 06/18/15  POCT urinalysis dipstick  Result Value Ref Range   Color, UA yellow yellow   Clarity, UA clear clear   Glucose, UA negative negative   Bilirubin, UA negative negative   Ketones, POC UA negative negative   Spec Grav, UA >=1.030    Blood, UA large (A) negative   pH, UA 5.5    Protein Ur, POC trace (A) negative   Urobilinogen, UA 1.0    Nitrite, UA Negative Negative   Leukocytes, UA Negative Negative  POCT Microscopic Urinalysis ([facility])  Result Value Ref Range   WBC,UR,HPF,POC None None WBC/hpf   RBC,UR,HPF,POC Few (A) None RBC/hpf   Bacteria Few (A) None, Too numerous to count   Mucus Present (A) Absent   Epithelial Cells, UR Per Microscopy Few (A) None, Too numerous to count cells/hpf  POCT urine pregnancy  Result Value Ref Range   Preg Test, Ur Negative Negative   Assessment & Plan:  [name] is a 29 y.o. female Dysuria - Plan: POCT urinalysis dipstick, POCT Microscopic Urinalysis ([facility]), Trichomonas vaginalis, RNA, CANCELED: POCT Microscopic Urinalysis ([facility]), CANCELED: POCT urinalysis dipstick  Suprapubic abdominal pain, unspecified laterality - Plan: Trichomonas vaginalis, RNA  Vaginal bleeding - Plan: POCT urine pregnancy, Trichomonas vaginalis, RNA  Unprotected sexual intercourse - Plan:  RPR, HIV antibody, GC/Chlamydia Probe Amp, Trichomonas vaginalis, RNA, CANCELED: POCT Wet + KOH Prep  No concerning findings on U/A, cramping/abd discomfort may be related to onset of menses. Will check sti testing. RTC for recheck if dysuria not continuing to improve, sooner if worsening as in AVS.   URI sx's noted at end of visit. No concerning findings on exam. Viral URI suspected. Sx care and rtc precautions given.      No orders of the defined types were placed in this encounter.   Patient Instructions  You should receive a call or letter about your lab results within the next week to 10 days.   Urinary  Testing in the office does not indicate an infection. As the burning had improved, we can wait to see if this continues to be improving in the  next 3 or 4 days. If you have any worsening of burning with urination, worsening abdominal or pelvic pain, fever, or other worsening - return for recheck. Return to the clinic or go to the nearest emergency room if any of your symptoms worsen or new symptoms occur.  I do not see any concerning findings on your throat or in your ears. You possibly had a viral infection that should also continue to improve in the next 3-4 days. If you have any worsening sore throat, fever, or worsening ear pain, return for recheck.  Return to the clinic or go to the nearest emergency room if any of your symptoms worsen or new symptoms occur.   Dysuria Dysuria is pain or discomfort while urinating. The pain or discomfort may be felt in the tube that carries urine out of the bladder (urethra) or in the surrounding tissue of the genitals. The pain may also be felt in the groin area, lower abdomen, and lower back. You may have to urinate frequently or have the sudden feeling that you have to urinate (urgency). Dysuria can affect both men and women, but is more common in women. Dysuria can be caused by many different things, including:  Urinary tract infection in  women.  Infection of the kidney or bladder.  Kidney stones or bladder stones.  Certain sexually transmitted infections (STIs), such as chlamydia.  Dehydration.  Inflammation of the vagina.  Use of certain medicines.  Use of certain soaps or scented products that cause irritation. HOME CARE INSTRUCTIONS Watch your dysuria for any changes. The following actions may help to reduce any discomfort you are feeling:  Drink enough fluid to keep your urine clear or pale yellow.  Empty your bladder often. Avoid holding urine for long periods of time.  After a bowel movement or urination, women should cleanse from front to back, using each tissue only once.  Empty your bladder after sexual intercourse.  Take medicines only as directed by your health care provider.  If you were prescribed an antibiotic medicine, finish it all even if you start to feel better.  Avoid caffeine, tea, and alcohol. They can irritate the bladder and make dysuria worse. In men, alcohol may irritate the prostate.  Keep all follow-up visits as directed by your health care provider. This is important.  If you had any tests done to find the cause of dysuria, it is your responsibility to obtain your test results. Ask the lab or department performing the test when and how you will get your results. Talk with your health care provider if you have any questions about your results. SEEK MEDICAL CARE IF:  You develop pain in your back or sides.  You have a fever.  You have nausea or vomiting.  You have blood in your urine.  You are not urinating as often as you usually do. SEEK IMMEDIATE MEDICAL CARE IF:  You pain is severe and not relieved with medicines.  You are unable to hold down any fluids.  You or someone else notices a change in your mental function.  You have a rapid heartbeat at rest.  You have shaking or chills.  You feel extremely weak.   This information is not intended to replace advice  given to you by your health care provider. Make sure you discuss any questions you have with your health care provider.   Document Released: 03/15/2004 Document Revised: 07/08/2014 Document Reviewed: 02/10/2014 Elsevier Interactive Patient Education 2016 [place].   Sore Throat  A sore throat is pain, burning, irritation, or scratchiness of the throat. There is often pain or tenderness when swallowing or talking. A sore throat may be accompanied by other symptoms, such as coughing, sneezing, fever, and swollen neck glands. A sore throat is often the first sign of another sickness, such as a cold, flu, strep throat, or mononucleosis (commonly known as mono). Most sore throats go away without medical treatment. CAUSES  The most common causes of a sore throat include:  A viral infection, such as a cold, flu, or mono.  A bacterial infection, such as strep throat, tonsillitis, or whooping cough.  Seasonal allergies.  Dryness in the air.  Irritants, such as smoke or pollution.  Gastroesophageal reflux disease (GERD). HOME CARE INSTRUCTIONS   Only take over-the-counter medicines as directed by your caregiver.  Drink enough fluids to keep your urine clear or pale yellow.  Rest as needed.  Try using throat sprays, lozenges, or sucking on hard candy to ease any pain (if older than 4 years or as directed).  Sip warm liquids, such as broth, herbal tea, or warm water with honey to relieve pain temporarily. You may also eat or drink cold or frozen liquids such as frozen ice pops.  Gargle with salt water (mix 1 tsp salt with 8 oz of water).  Do not smoke and avoid secondhand smoke.  Put a cool-mist humidifier in your bedroom at night to moisten the air. You can also turn on a hot shower and sit in the bathroom with the door closed for 5-10 minutes. SEEK IMMEDIATE MEDICAL CARE IF:  You have difficulty breathing.  You are unable to swallow fluids, soft foods, or your saliva.  You have  increased swelling in the throat.  Your sore throat does not get better in 7 days.  You have nausea and vomiting.  You have a fever or persistent symptoms for more than 2-3 days.  You have a fever and your symptoms suddenly get worse. MAKE SURE YOU:   Understand these instructions.  Will watch your condition.  Will get help right away if you are not doing well or get worse.   This information is not intended to replace advice given to you by your health care provider. Make sure you discuss any questions you have with your health care provider.   Document Released: 07/25/2004 Document Revised: 07/08/2014 Document Reviewed: 02/23/2012 Elsevier Interactive Patient Education [employer].       I personally performed the services described in this documentation, which was scribed in my presence. The recorded information has been reviewed and considered, and addended by me as needed.

## 2015-06-18 NOTE — Progress Notes (Signed)
Normal external genitalia.  Blood in the vaginal canal.  No CMT.  No adnexal tenderness.  Fibroid posterior uterus palpable.

## 2015-06-19 LAB — TRICHOMONAS VAGINALIS, PROBE AMP: T vaginalis RNA: NEGATIVE

## 2015-06-19 LAB — HIV ANTIBODY (ROUTINE TESTING W REFLEX): HIV: NONREACTIVE

## 2015-06-19 LAB — GC/CHLAMYDIA PROBE AMP
CT PROBE, AMP APTIMA: NOT DETECTED
GC Probe RNA: NOT DETECTED

## 2015-06-19 LAB — RPR

## 2015-06-22 ENCOUNTER — Telehealth: Payer: Self-pay | Admitting: *Deleted

## 2015-06-22 NOTE — Telephone Encounter (Signed)
Pt notified that all labs were normal per [facility].

## 2015-07-09 ENCOUNTER — Encounter (HOSPITAL_COMMUNITY): Payer: Self-pay | Admitting: Family Medicine

## 2015-07-09 ENCOUNTER — Emergency Department (HOSPITAL_COMMUNITY)
Admission: EM | Admit: 2015-07-09 | Discharge: 2015-07-09 | Disposition: A | Discharge status: Home / Self Care | Payer: 59 | Attending: Emergency Medicine | Admitting: Emergency Medicine

## 2015-07-09 DIAGNOSIS — Z8632 Personal history of gestational diabetes: Secondary | ICD-10-CM | POA: Insufficient documentation

## 2015-07-09 DIAGNOSIS — Z86018 Personal history of other benign neoplasm: Secondary | ICD-10-CM | POA: Diagnosis not present

## 2015-07-09 DIAGNOSIS — R21 Rash and other nonspecific skin eruption: Secondary | ICD-10-CM | POA: Diagnosis not present

## 2015-07-09 DIAGNOSIS — Z8719 Personal history of other diseases of the digestive system: Secondary | ICD-10-CM | POA: Insufficient documentation

## 2015-07-09 DIAGNOSIS — Z8679 Personal history of other diseases of the circulatory system: Secondary | ICD-10-CM | POA: Diagnosis not present

## 2015-07-09 DIAGNOSIS — Z79899 Other long term (current) drug therapy: Secondary | ICD-10-CM | POA: Diagnosis not present

## 2015-07-09 DIAGNOSIS — Z8619 Personal history of other infectious and parasitic diseases: Secondary | ICD-10-CM | POA: Insufficient documentation

## 2015-07-09 MED ORDER — DIPHENHYDRAMINE HCL 25 MG PO TABS: 25.0000 mg | ORAL_TABLET | Freq: Four times a day (QID) | ORAL | Status: DC

## 2015-07-09 MED ORDER — HYDROCORTISONE 1 % EX OINT
TOPICAL_OINTMENT | Freq: Three times a day (TID) | CUTANEOUS | Status: DC
  Administered 2015-07-09: 22:00:00 via TOPICAL
  Filled 2015-07-09: qty 28.35

## 2015-07-09 NOTE — ED Notes (Signed)
Patient reports on Thursday she started scratching on her arms with no rash. On Friday, no itching or rash. On Saturday, she started itching with red, raised whelps on face, hands, and arms.

## 2015-07-09 NOTE — ED Provider Notes (Signed)
CSN: [id number]     Arrival date & time 07/09/15  1912 History  By signing my name below, I, Stephania Fragmin, attest that this documentation has been prepared under the direction and in the presence of [employer], PA-C. Electronically Signed: Stephania Fragmin, ED Scribe. 07/09/2015. 1:48 AM.    Chief Complaint  Patient presents with  . Pruritis  . Rash   The history is provided by the patient. No language interpreter was used.    HPI Comments: Tonjia Vivas is a 30 y.o. female with no pertinent PMHx, who presents to the Emergency Department complaining of a gradual-onset, constant, gradually worsening, pruritic rash on her right hand, left elbow, right cheek, and right-sided forehead that began 3 days ago. She states she tried a new Dial soap shortly before her rash onset. Patient also notes she had tried new African food on Wednesday but denies any seafood consumption. Patient states she has not tried any treatments or medications for her rash. She reports she drove here today. She denies a history of any chronic medical conditions. She denies fever.  Past Medical History  Diagnosis Date  . Gonorrhea   . Chlamydia   . Arrhythmia   . Hx of migraine headaches   . Headache(784.0)   . Anemia     first and sec pregnancies  . Fibroid   . Leiomyoma of uterus in pregnancy   . Hemorrhoids   . Gestational diabetes     diet controlled   Past Surgical History  Procedure Laterality Date  . Wisdom tooth extraction    . Induced abortion  March  2 ,2013   Family History  Problem Relation Age of Onset  . Other Neg Hx   . Alcohol abuse Neg Hx   . Arthritis Neg Hx   . Asthma Neg Hx   . Birth defects Neg Hx   . Cancer Neg Hx   . COPD Neg Hx   . Depression Neg Hx   . Diabetes Neg Hx   . Drug abuse Neg Hx   . Early death Neg Hx   . Heart disease Neg Hx   . Hearing loss Neg Hx   . Hyperlipidemia Neg Hx   . Hypertension Neg Hx   . Kidney disease Neg Hx   . Learning disabilities Neg Hx   .  Mental illness Neg Hx   . Mental retardation Neg Hx   . Miscarriages / Stillbirths Neg Hx   . Stroke Neg Hx   . Vision loss Neg Hx    Social History  Substance Use Topics  . Smoking status: Never Smoker   . Smokeless tobacco: Never Used  . Alcohol Use: Yes     Comment: Every other weekend.    OB History    Gravida Para Term Preterm AB TAB SAB Ectopic Multiple Living   5 4 4  0 1 1 0 0 0 4     Review of Systems A complete 10 system review of systems was obtained and all systems are negative except as noted in the HPI and PMH.    Allergies  Review of patient's allergies indicates no known allergies.  Home Medications   Prior to Admission medications   Medication Sig Start Date End Date Taking? Authorizing Provider  Biotin 10 MG TABS Take 10 mg by mouth daily.   Yes Historical Provider, MD  diphenhydrAMINE (BENADRYL) 25 MG tablet Take 1 tablet (25 mg total) by mouth every 6 (six) hours. 07/09/15   Ottie Glazier,  PA-C   BP 117/65 mmHg  Pulse 82  Temp(Src) 98.1 F (36.7 C) (Oral)  Resp 20  Ht 5\' 4"  (1.626 m)  Wt 77.565 kg  BMI 29.34 kg/m2  SpO2 100%  LMP 06/17/2015 Physical Exam  Constitutional: She is oriented to person, place, and time. She appears well-developed and well-nourished. No distress.  HENT:  Head: Normocephalic and atraumatic.  Eyes: Conjunctivae and EOM are normal.  Neck: Neck supple. No tracheal deviation present.  Cardiovascular: Normal rate.   Pulmonary/Chest: Effort normal. No respiratory distress.  Musculoskeletal: Normal range of motion.  Neurological: She is alert and oriented to person, place, and time.  Skin: Skin is warm and dry. Rash noted.  Raised, erythematous macular rash on the right hand, left elbow, and face, but no drainage or signs of cellulitis.   Psychiatric: She has a normal mood and affect. Her behavior is normal.  Nursing note and vitals reviewed.   ED Course  Procedures (including critical care time)  DIAGNOSTIC  STUDIES: Oxygen Saturation is 100% on RA, normal by my interpretation.    COORDINATION OF CARE: 9:36 PM - Discussed treatment plan with pt at bedside which includes Rx Benadryl. Pt verbalized understanding and agreed to plan.   MDM   Final diagnoses:  Rash   Patient with contact dermatitis. Instructed to avoid offending agent and to use unscented soaps, lotions, and detergents. Will treat with Benadryl and hydrocortisone cream.  No signs of secondary infection. I discussed that this may have been from the new foods or the new soap that she has been using. She is to avoid these in the future. Return precautions discussed. Pt is safe for discharge at this time.  I personally performed the services described in this documentation, which was scribed in my presence. The recorded information has been reviewed and is accurate. Filed Vitals:   07/09/15 1925 07/09/15 1931  BP: 117/65   Pulse: 82   Temp: 98.1 F (36.7 C)   Resp:  [address] PA-C 07/10/15 0148  Lacretia Leigh, MD 07/12/15 [phone]

## 2015-07-09 NOTE — Discharge Instructions (Signed)
°Emergency Department Resource Guide °1) Find a Doctor and Pay Out of Pocket °Although you won't have to find out who is covered by your insurance plan, it is a good idea to ask around and get recommendations. You will then need to call the office and see if the doctor you have chosen will accept you as a new patient and what types of options they offer for patients who are self-pay. Some doctors offer discounts or will set up payment plans for their patients who do not have insurance, but you will need to ask so you aren't surprised when you get to your appointment. ° °2) Contact Your Local Health Department °Not all health departments have doctors that can see patients for sick visits, but many do, so it is worth a call to see if yours does. If you don't know where your local health department is, you can check in your phone book. The CDC also has a tool to help you locate your state's health department, and many state websites also have listings of all of their local health departments. ° °3) Find a Walk-in Clinic °If your illness is not likely to be very severe or complicated, you may want to try a walk in clinic. These are popping up all over the country in pharmacies, drugstores, and shopping centers. They're usually staffed by nurse practitioners or physician assistants that have been trained to treat common illnesses and complaints. They're usually fairly quick and inexpensive. However, if you have serious medical issues or chronic medical problems, these are probably not your best option. ° °No Primary Care Doctor: °- Call [facility] at  832-8000 - they can help you locate a primary care doctor that  accepts your insurance, provides certain services, etc. °- Physician Referral Service- 1-800-533-3463 ° °Chronic Pain Problems: °Organization         Address  Phone   Notes  °[facility]  (336) 297-2271 Patients need to be referred by their primary care doctor.  ° °Medication  Assistance: °Organization         Address  Phone   Notes  °[place] [facility] [address] °[place], [place] [address] (336) 641-8030 --Must be a resident of [place] °-- Must have NO insurance coverage whatsoever (no Medicaid/ Medicare, etc.) °-- The pt. MUST have a primary care doctor that directs their care regularly and follows them in the community °  °[facility]  (866) 331-1348   °[employer]  (888) 892-1162   ° °Agencies that provide inexpensive medical care: °Organization         Address  Phone   Notes  °[facility]  (336) 832-8035   °[facility]    (336) 832-7272   °[facility] [address] °[place], [place] [address] (336) 832-4777   °[facility] [address], °[place] (336) 271-4999   °[facility]    (336) 373-0678   °[facility]    (336) 272-1050   °[facility] ° [address], [place] Phone:  (336) 832-4444, Fax:  (336) 832-4440 Hours of Operation:  9 am - 6 pm, M-F.  Also accepts Medicaid/Medicare and self-pay.  °[facility] ° [address],  Phone: (336) 832-3150, Fax: (336) 832-3151. Hours of Operation:  8:30 am - 5:30 pm, M-F.  Also accepts Medicaid and self-pay.  °[facility] [place] [address], [place] Phone: (336) 878-6027   °[facility] [address], [place], [place] (336)723-1848, Ext. 123 Mondays & Thursdays: 7-9 AM.  First 15 patients are seen on a first come, first serve basis. °  ° °Medicaid-accepting [place] Providers: ° °Organization         Address  Phone   Notes  °[facility] [address], [place] (336) 641-2100 Also accepts self-pay patients.  °[facility] [address], [place] ° (336) 856-9996   °[facility] [address], [place]  (336) 288-8857   °[facility] [address] [place] [address], [place] (336) 299-7000   °Veita Bland [address], [place]  ° (336) 373-1557 Only accepts [place] Access Medicaid patients after they have their name applied to their card.  ° °Self-Pay (no insurance) in [place]: ° °Organization         Address  Phone   Notes  °Sickle Cell Patients, [facility] [address], [place] (336) 832-1970   °[facility] [address], [place] (336) 832-4400   °[facility] [place] [facility] ° [address], [place] (336) 992-4800   °[facility]/Dr. Osei-Bonsu ° [address] [place] [address], [place] or [address], [place] (336) 841-8500 Phone number for both [place] and [place] locations is the same.  °[facility] [address], [place] (336) 299-0000   °[facility] [address] [place] [address], [place] or [address] (336) 852-7530 °(336) 878-2260   °[facility] [address], [place] (336) 350-1642, phone; (336) 294-5005, fax Sees patients 1st and 3rd Saturday of every month.  Must not qualify for public or private insurance (i.e. Medicaid, Medicare, [place] Health Choice, Veterans' Benefits) • Household income should be no more than 200% of the poverty level •The clinic cannot treat you if you are pregnant or think you are pregnant • Sexually transmitted diseases are not treated at the clinic.  ° ° °Dental Care: °Organization         Address  Phone  Notes  °[place] [facility] [address],  (336) 641-6152 Accepts children up to age 21 who are enrolled in Medicaid or [place] Health Choice; pregnant women with a Medicaid card; and children who have applied for Medicaid or [place] Health Choice, but were declined, whose parents can pay a reduced fee at time of service.  °[place]  [facility] [place]  [address], [place] (336) 641-7733 Accepts children up to age 21 who are enrolled in Medicaid or [place] Health Choice; pregnant women with a Medicaid card; and children who have applied for Medicaid or [place] Health Choice, but were declined, whose parents can pay a reduced fee at time of service.  °[facility] ° [address],  (336) 641-4533 Patients are seen by appointment only. Walk-ins are not accepted. [facility] will see patients 18 years of age and older. °Monday - Tuesday (8am-5pm) °Most Wednesdays (8:30-5pm) °$30 per visit, cash only  °[facility] ° [address], [place] (336) 641-4533 Patients are seen by appointment only. Walk-ins are not accepted. [facility] will see patients 18 years of age and older. °One   Wednesday Evening (Monthly: Volunteer Based).  $30 per visit, cash only  °[facility]  (919) 537-3737 for adults; Children under age 4, call Graduate Pediatric Dentistry at (919) [phone]. Children aged 4-14, please call (919) [phone] to request a pediatric application. ° Dental services are provided in all areas of dental care including fillings, crowns and bridges, complete and partial dentures, implants, gum treatment, root canals, and extractions. Preventive care is also provided. Treatment is provided to both adults and children. °Patients are selected via a lottery and there is often a waiting list. °  °[facility] [address], °[place] ° (336) 763-8833 [email or web address] °  °[facility] [address], [place], [place] (336)723-1848, Ext. 123 Second and Fourth Thursday of each month, opens at 6:30 AM; Clinic ends at 9 AM.  Patients are seen on a first-come first-served basis, and a limited number are seen during each clinic.  ° °[facility] ° [address], [place], [place] (336) 723-7904    Eligibility Requirements °You must have lived in [place], [place], or [place] counties for at least the last three months. °  You cannot be eligible for state or federal sponsored healthcare insurance, including Veterans Administration, Medicaid, or Medicare. °  You generally cannot be eligible for healthcare insurance through your employer.  °  How to apply: °Eligibility screenings are held every Tuesday and Wednesday afternoon from 1:00 pm until 4:00 pm. You do not need an appointment for the interview!  °[facility] [address], [place], [place] 336-631-2330   °[facility]  336-342-8273   °[facility]  336-703-3100   °[facility]  336-570-6415   ° °Behavioral Health Resources in the Community: °Intensive Outpatient Programs °Organization         Address  Phone  Notes  °[facility] [address], [place], [place] 336-878-6098   °[facility] [address], [place], [place] 336-832-9800   °[facility] [address], [place], [place] ° 336-882-2125   °[facility] [address],  °[place], [place] 1-800-853-5163 or 336-641-4981   °Substance Abuse Resources °Organization         Address  Phone  Notes  °[facility]  336-882-2125   °[facility]  336-784-9470   °[facility]  336-285-9073   °[facility]  336-845-3988   °Residential & Outpatient Substance Abuse Program  1-800-659-3381   °Psychological Services °Organization         Address  Phone  Notes  °[facility]  336- 832-9600   °[facility]  336- 378-7881   °[facility] [address], [place] 1-800-853-5163 or 336-641-4981   ° °Mobile Crisis Teams °Organization         Address  Phone  Notes  °[facility], Mobile Crisis Care Unit  1-877-626-1772   °Assertive °[facility] ° [address]  [place], [place] 336-834-9664   °Sharon DeEsch [address] °[place] 336-554-5454   ° °Self-Help/Support Groups °Organization         Address  Phone             Notes  °[facility] - variety of support groups  336- 373-1402 Call for more information  °Narcotics Anonymous (NA), [facility] [address], °[place]  2 meetings at this location  ° °  Residential Treatment Programs °Organization         Address  Phone  Notes  °[facility] [address],    °[place]  1-866-801-8205   °[facility] ° [address], [place], [place] 704-293-8524   °[facility] [address], [place] 336-845-3988 Admissions: 8am-3pm M-F  °[facility] [address]    °[place], [place] 336-841-1104   °[facility] [address], [place], [place] 336-379-7146   °[facility] [address]  °[place], [place] 336-285-9073   °[facility] - Intensive Outpatient [address], [place], [place] 336-852-3033   °[facility] [address]  °[place], [place] 1-877-615-2722 or 336-784-9470   °[facility] [address] [place], [place] 336-227-7417 Accepts Medicaid  °[facility] [address]  °[place] 1-800-659-3381 Substance Abuse/Addiction Treatment  ° °[place] Behavioral Health Resources °Organization         Address  Phone  Notes  °[facility]  (888) 581-9988   °Julie Brannon, PhD [address] [place], [place]   (336) 349-5553 or (336) 951-0000   °[facility]   [address] °[place], [place] (336) 349-4454   °[facility] [address], [place], [place] (336) 342-8316 Insurance/Medicaid/sponsorship through [facility]  °[facility] [address]                                    [place], [place] (336) 342-8316 Therapy/tele-psych/case    °[facility] [address]  ° [place], [place] (336) 349-2233    °Dr. Arfeen  (336) 349-4544   °[facility] [place]  [employer] [place] [facility] 1) [address], [place] °2) [address], [place] °3)  [address], [place] (336) 349-3220 °(336) 342-7768 ° °(336) 342-8140   °[place] [facility] (336) 342-1394 or (336) 342-3537 (After Hours)    ° ° °

## 2015-08-06 ENCOUNTER — Emergency Department (HOSPITAL_COMMUNITY)
Admission: EM | Admit: 2015-08-06 | Discharge: 2015-08-06 | Disposition: A | Discharge status: Home / Self Care | Payer: 59 | Attending: Emergency Medicine | Admitting: Emergency Medicine

## 2015-08-06 ENCOUNTER — Encounter (HOSPITAL_COMMUNITY): Payer: Self-pay

## 2015-08-06 DIAGNOSIS — R21 Rash and other nonspecific skin eruption: Secondary | ICD-10-CM | POA: Insufficient documentation

## 2015-08-06 DIAGNOSIS — Z8679 Personal history of other diseases of the circulatory system: Secondary | ICD-10-CM | POA: Diagnosis not present

## 2015-08-06 DIAGNOSIS — Z8632 Personal history of gestational diabetes: Secondary | ICD-10-CM | POA: Insufficient documentation

## 2015-08-06 DIAGNOSIS — Z8719 Personal history of other diseases of the digestive system: Secondary | ICD-10-CM | POA: Diagnosis not present

## 2015-08-06 DIAGNOSIS — L819 Disorder of pigmentation, unspecified: Secondary | ICD-10-CM | POA: Insufficient documentation

## 2015-08-06 DIAGNOSIS — Z86018 Personal history of other benign neoplasm: Secondary | ICD-10-CM | POA: Diagnosis not present

## 2015-08-06 DIAGNOSIS — Z79899 Other long term (current) drug therapy: Secondary | ICD-10-CM | POA: Diagnosis not present

## 2015-08-06 DIAGNOSIS — Z8619 Personal history of other infectious and parasitic diseases: Secondary | ICD-10-CM | POA: Diagnosis not present

## 2015-08-06 MED ORDER — LORATADINE 10 MG PO TABS
10.0000 mg | ORAL_TABLET | Freq: Every day | ORAL | Status: DC
  Administered 2015-08-06: 10 mg via ORAL
  Filled 2015-08-06: qty 1

## 2015-08-06 MED ORDER — CETIRIZINE HCL 10 MG PO TABS: 10.0000 mg | ORAL_TABLET | Freq: Every day | ORAL | Status: DC

## 2015-08-06 MED ORDER — HYDROCORTISONE 2.5 % EX LOTN: TOPICAL_LOTION | Freq: Two times a day (BID) | CUTANEOUS | Status: DC

## 2015-08-06 NOTE — ED Provider Notes (Signed)
CSN: [id number]     Arrival date & time 08/06/15  2106 History  By signing my name below, I, [name], attest that this documentation has been prepared under the direction and in the presence of Abigail [place]ts, PA-C Electronically Signed: [name], ED Scribe. 08/06/2015. 10:42 PM.   Chief Complaint  Patient presents with  . Rash  . Allergic Reaction     The history is provided by the patient and medical records. No language interpreter was used.    Sonia Allen is a 30 y.o. female who presents to the Emergency Department complaining of waxing and waning pruritic rash to her bilateral arms onset 1 month ago. She notes that when she scratches her arms, bumps will appear on her arms and when resolved the bumps are dark brown in color. She is unsure of how long the bumps will last when they first appear. She states that she was seen initially 1 month ago for her symptoms and Rx hydrocortisone. Pt states that the hydrocortisone cream did alleviate her symptoms but she has used up the Rx. 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 Pt denies rash in the spacing between her fingers or to the soles of her feet. Pt denies new soaps/medications/pets/environment/lotion/detergent/environments. Pt is having associated symptoms of color change. She notes that she has tried Rx hydrocortisone and benadryl with her last dose being 2 days ago for the relief of her symptoms. Denies trying zyrtec for her symptoms. She denies wound and any other symptoms. Pt denies allergies to any medications. Pt denies lesions to other places on her body aside from her arms.  Per pt chart review: Pt was seen in the ED on 07/09/2015 for a pruritic rash to her right hand/left elbow/right cheek/right sided forehead. Pt was dx with contact dermatitis and Rx benadryl and hydrocortisone cream.  At that time patient had stated use of both new foods and  new detergents but she denies reuse of these.    Past Medical History  Diagnosis Date  . Gonorrhea   . Chlamydia   . Arrhythmia   . Hx of migraine headaches   . Headache(784.0)   . Anemia     first and sec pregnancies  . Fibroid   . Leiomyoma of uterus in pregnancy   . Hemorrhoids   . Gestational diabetes     diet controlled   Past Surgical History  Procedure Laterality Date  . Wisdom tooth extraction    . Induced abortion  March  2 ,2013   Family History  Problem Relation Age of Onset  . Other Neg Hx   . Alcohol abuse Neg Hx   . Arthritis Neg Hx   . Asthma Neg Hx   . Birth defects Neg Hx   . Cancer Neg Hx   . COPD Neg Hx   . Depression Neg Hx   . Diabetes Neg Hx   . Drug abuse Neg Hx   . Early death Neg Hx   . Heart disease Neg Hx   . Hearing loss Neg Hx   . Hyperlipidemia Neg Hx   . Hypertension Neg Hx   . Kidney disease Neg Hx   . Learning disabilities Neg Hx   . Mental illness Neg Hx   . Mental retardation Neg Hx   . Miscarriages / Stillbirths Neg Hx   . Stroke Neg Hx   .  Vision loss Neg Hx    Social History  Substance Use Topics  . Smoking status: Never Smoker   . Smokeless tobacco: Never Used  . Alcohol Use: Yes     Comment: Every other weekend.    OB History    Gravida Para Term Preterm AB TAB SAB Ectopic Multiple Living   5 4 4  0 1 1 0 0 0 4     Review of Systems  Constitutional: Negative for fever, diaphoresis, appetite change, fatigue and unexpected weight change.  HENT: Negative for mouth sores.   Eyes: Negative for visual disturbance.  Respiratory: Negative for cough, chest tightness, shortness of breath and wheezing.   Cardiovascular: Negative for chest pain.  Gastrointestinal: Negative for nausea, vomiting, abdominal pain, diarrhea and constipation.  Endocrine: Negative for polydipsia, polyphagia and polyuria.  Genitourinary: Negative for dysuria, urgency, frequency and hematuria.  Musculoskeletal: Negative for back pain and neck  stiffness.  Skin: Positive for color change and rash.  Allergic/Immunologic: Negative for immunocompromised state.  Neurological: Negative for syncope, light-headedness and headaches.  Hematological: Does not bruise/bleed easily.  Psychiatric/Behavioral: Negative for sleep disturbance. The patient is not nervous/anxious.       Allergies  Review of patient's allergies indicates no known allergies.  Home Medications   Prior to Admission medications   Medication Sig Start Date End Date Taking? Authorizing Provider  Biotin 10 MG TABS Take 10 mg by mouth daily.    Historical Provider, MD  cetirizine (ZYRTEC ALLERGY) 10 MG tablet Take 1 tablet (10 mg total) by mouth daily. 08/06/15   Lilyanne Mcquown, PA-C  diphenhydrAMINE (BENADRYL) 25 MG tablet Take 1 tablet (25 mg total) by mouth every 6 (six) hours. 07/09/15   Hanna Patel-Mills, PA-C  hydrocortisone 2.5 % lotion Apply topically 2 (two) times daily. 08/06/15   Nathanal Hermiz, PA-C   BP 128/78 mmHg  Pulse 89  Temp(Src) 98.1 F (36.7 C) (Oral)  Resp 16  SpO2 100%  LMP 08/06/2015 (Exact Date)  Breastfeeding? No Physical Exam  Constitutional: She is oriented to person, place, and time. She appears well-developed and well-nourished. No distress.  HENT:  Head: Normocephalic and atraumatic.  Right Ear: Tympanic membrane, external ear and ear canal normal.  Left Ear: Tympanic membrane, external ear and ear canal normal.  Nose: Nose normal. No mucosal edema or rhinorrhea.  Mouth/Throat: Uvula is midline. No uvula swelling. No oropharyngeal exudate, posterior oropharyngeal edema, posterior oropharyngeal erythema or tonsillar abscesses.  No swelling of the uvula or oropharynx   Eyes: Conjunctivae are normal.  Neck: Normal range of motion.  Patent airway No stridor; normal phonation Handling secretions without difficulty  Cardiovascular: Normal rate, normal heart sounds and intact distal pulses.   No murmur heard. Pulmonary/Chest:  Effort normal and breath sounds normal. No stridor. No respiratory distress. She has no wheezes.  No wheezes or rhonchi  Abdominal: Soft. Bowel sounds are normal. There is no tenderness.  Musculoskeletal: Normal range of motion. She exhibits no edema.  Neurological: She is alert and oriented to person, place, and time.  Skin: Skin is warm and dry. Rash noted. Rash is papular. Rash is not vesicular. She is not diaphoretic.  1 area of erythematous papular lesion to the dorsum of the right hand with similar lesions noted to left medial forearm Hyperpigmentation noted to mult areas on bilateral arms where pt reports prev lesions have been. No vesicles, bulla, ulcerations, induration, central clearing lesion, lesions to the fingers or web spaces. Mild excoriations - no induration or  fluctuance to indicate secondary infection  Psychiatric: She has a normal mood and affect.  Nursing note and vitals reviewed.   ED Course  Procedures (including critical care time) DIAGNOSTIC STUDIES: Oxygen Saturation is 100% on RA, nl by my interpretation.    COORDINATION OF CARE: 10:43 PM Discussed treatment plan with pt at bedside which includes referral to dermatology, hydrocortisone cream, and zyrtec Rx and pt agreed to plan.   MDM   Final diagnoses:  Rash and nonspecific skin eruption   Sonia Allen presents with waxing and waning rash to the bilateral arms.  Nonspecific skin eruption. Patient denies any difficulty breathing or swallowing.  Pt has a patent airway without stridor and is handling secretions without difficulty; no angioedema. No blisters, no pustules, no warmth, no draining sinus tracts, no superficial abscesses, no bullous impetigo, no vesicles, no desquamation, no target lesions with dusky purpura or a central bulla. Not tender to touch. No concern for superimposed infection. No concern for SJS, TEN, TSS, tick borne illness, syphilis or other life-threatening condition. Will discharge home  with hydrocortisone and Zyrtec as hydrocortisone helped previously.  Concern that this may be related to her manicure. Recommended that she see dermatology within one week for further evaluation and potential biopsy of the persistently hyperpigmented lesions.    BP 128/78 mmHg  Pulse 89  Temp(Src) 98.1 F (36.7 C) (Oral)  Resp 16  SpO2 100%  LMP 08/06/2015 (Exact Date)  Breastfeeding? No  Abigail Butts, PA-C 08/06/15 [place], MD 08/07/15 [phone]

## 2015-08-06 NOTE — Discharge Instructions (Signed)
1. Medications: zyrtec, hydrocortisone lotion, usual home medications 2. Treatment: rest, drink plenty of fluids, benadryl as needed for itching, keep wounds clean with warm soap and water 3. Follow Up: Please followup with your Dermatology in 7-10 days for discussion of your diagnoses and further evaluation after today's visit; if you do not have a primary care doctor use the resource guide provided to find one; Please return to the ER for worsening symptoms, signs of infection

## 2015-08-06 NOTE — ED Notes (Signed)
Pt presents with itchy rash to arms bilaterally. Pt states she she believe this is caused by her new job working in a [employer]. Pt denies itchy throat, difficulty breathing or SOB. Pt was seen here for the same a month ago. Pt A+OX4, speaking in complete sentences, denies pain.

## 2015-10-05 ENCOUNTER — Encounter (HOSPITAL_COMMUNITY): Payer: Self-pay | Admitting: *Deleted

## 2015-10-05 ENCOUNTER — Inpatient Hospital Stay (HOSPITAL_COMMUNITY)
Admission: AD | Admit: 2015-10-05 | Discharge: 2015-10-05 | Disposition: A | Discharge status: Home / Self Care | Payer: 59 | Source: Ambulatory Visit | Attending: Obstetrics & Gynecology | Admitting: Obstetrics & Gynecology

## 2015-10-05 ENCOUNTER — Inpatient Hospital Stay (HOSPITAL_COMMUNITY): Payer: 59

## 2015-10-05 DIAGNOSIS — O219 Vomiting of pregnancy, unspecified: Secondary | ICD-10-CM

## 2015-10-05 DIAGNOSIS — O9989 Other specified diseases and conditions complicating pregnancy, childbirth and the puerperium: Secondary | ICD-10-CM | POA: Diagnosis not present

## 2015-10-05 DIAGNOSIS — N898 Other specified noninflammatory disorders of vagina: Secondary | ICD-10-CM | POA: Diagnosis not present

## 2015-10-05 DIAGNOSIS — O26891 Other specified pregnancy related conditions, first trimester: Secondary | ICD-10-CM | POA: Insufficient documentation

## 2015-10-05 DIAGNOSIS — A599 Trichomoniasis, unspecified: Secondary | ICD-10-CM

## 2015-10-05 DIAGNOSIS — R102 Pelvic and perineal pain: Secondary | ICD-10-CM | POA: Diagnosis not present

## 2015-10-05 DIAGNOSIS — Z3490 Encounter for supervision of normal pregnancy, unspecified, unspecified trimester: Secondary | ICD-10-CM

## 2015-10-05 DIAGNOSIS — Z3A01 Less than 8 weeks gestation of pregnancy: Secondary | ICD-10-CM

## 2015-10-05 LAB — CBC
HCT: 31.3 % — ABNORMAL LOW (ref 36.0–46.0)
HEMOGLOBIN: 10.3 g/dL — AB (ref 12.0–15.0)
MCH: 28 pg (ref 26.0–34.0)
MCHC: 32.9 g/dL (ref 30.0–36.0)
MCV: 85.1 fL (ref 78.0–100.0)
PLATELETS: 355 10*3/uL (ref 150–400)
RBC: 3.68 MIL/uL — AB (ref 3.87–5.11)
RDW: 13.9 % (ref 11.5–15.5)
WBC: 8.1 10*3/uL (ref 4.0–10.5)

## 2015-10-05 LAB — URINE MICROSCOPIC-ADD ON

## 2015-10-05 LAB — URINALYSIS, ROUTINE W REFLEX MICROSCOPIC
Bilirubin Urine: NEGATIVE
Glucose, UA: NEGATIVE mg/dL
Ketones, ur: NEGATIVE mg/dL
Leukocytes, UA: NEGATIVE
Nitrite: NEGATIVE
PH: 6 (ref 5.0–8.0)
PROTEIN: NEGATIVE mg/dL
SPECIFIC GRAVITY, URINE: 1.025 (ref 1.005–1.030)

## 2015-10-05 LAB — WET PREP, GENITAL
SPERM: NONE SEEN
Yeast Wet Prep HPF POC: NONE SEEN

## 2015-10-05 LAB — POCT PREGNANCY, URINE: PREG TEST UR: POSITIVE — AB

## 2015-10-05 LAB — HCG, QUANTITATIVE, PREGNANCY: HCG, BETA CHAIN, QUANT, S: 23499 m[IU]/mL — AB (ref <5)

## 2015-10-05 MED ORDER — PROMETHAZINE HCL 25 MG PO TABS: 12.5000 mg | ORAL_TABLET | Freq: Four times a day (QID) | ORAL | Status: DC | PRN

## 2015-10-05 MED ORDER — METRONIDAZOLE 500 MG PO TABS
2000.0000 mg | ORAL_TABLET | Freq: Once | ORAL | Status: AC
  Administered 2015-10-05: 2000 mg via ORAL
  Filled 2015-10-05: qty 4

## 2015-10-05 NOTE — MAU Note (Addendum)
PT SAYS SHE DID  HPT    ON Tuesday- POSITIVE.     SAYS LOWER ABD FEELS HEAVY-   STARTED  YESTERDAY.   FEEL  WEAK.    AND  NAUSEA.      PLANS    TO GET  [facility]-  IN CLINIC.  HAS   NOT TAKEN   ANY MEDS-      ALSO   HAS  HEAD COLD .

## 2015-10-05 NOTE — MAU Provider Note (Signed)
History     CSN: [id number]  Arrival date and time: 10/05/15 2002   First Provider Initiated Contact with Patient 10/05/15 2200      Chief Complaint  Patient presents with  . Pelvic Pain   Pelvic Pain The patient's primary symptoms include pelvic pain. This is a new problem. The current episode started yesterday. The problem occurs constantly. The problem has been unchanged. Pain severity now: 5/10  The problem affects both sides. She is pregnant. Associated symptoms include abdominal pain and nausea. Pertinent negatives include no chills, constipation, diarrhea, dysuria, fever, frequency, urgency or vomiting. The vaginal discharge was normal. There has been no bleeding. Nothing aggravates the symptoms. She has tried nothing for the symptoms. She uses nothing (took plan B ~3 weeks ago. Unsure of exact day. ) for contraception. Her menstrual history has been regular (LMP 09/01/15).   Past Medical History  Diagnosis Date  . Gonorrhea   . Chlamydia   . Arrhythmia   . Hx of migraine headaches   . Headache(784.0)   . Anemia     first and sec pregnancies  . Fibroid   . Leiomyoma of uterus in pregnancy   . Hemorrhoids   . Gestational diabetes     diet controlled    Past Surgical History  Procedure Laterality Date  . Wisdom tooth extraction    . Induced abortion  March  2 ,2013    Family History  Problem Relation Age of Onset  . Other Neg Hx   . Alcohol abuse Neg Hx   . Arthritis Neg Hx   . Asthma Neg Hx   . Birth defects Neg Hx   . Cancer Neg Hx   . COPD Neg Hx   . Depression Neg Hx   . Diabetes Neg Hx   . Drug abuse Neg Hx   . Early death Neg Hx   . Heart disease Neg Hx   . Hearing loss Neg Hx   . Hyperlipidemia Neg Hx   . Hypertension Neg Hx   . Kidney disease Neg Hx   . Learning disabilities Neg Hx   . Mental illness Neg Hx   . Mental retardation Neg Hx   . Miscarriages / Stillbirths Neg Hx   . Stroke Neg Hx   . Vision loss Neg Hx     Social History   Substance Use Topics  . Smoking status: Never Smoker   . Smokeless tobacco: Never Used  . Alcohol Use: Yes     Comment: Every other weekend.     Allergies: No Known Allergies  Facility-administered medications prior to admission  Medication Dose Route Frequency Provider Last Rate Last Dose  . Tdap (BOOSTRIX) injection 0.5 mL  0.5 mL Intramuscular Once Truett Mainland, DO       Prescriptions prior to admission  Medication Sig Dispense Refill Last Dose  . Biotin 10 MG TABS Take 10 mg by mouth daily.   Past Month at Unknown time  . cetirizine (ZYRTEC ALLERGY) 10 MG tablet Take 1 tablet (10 mg total) by mouth daily. 30 tablet 1   . diphenhydrAMINE (BENADRYL) 25 MG tablet Take 1 tablet (25 mg total) by mouth every 6 (six) hours. 20 tablet 0   . hydrocortisone 2.5 % lotion Apply topically 2 (two) times daily. 59 mL 0     Review of Systems  Constitutional: Negative for fever and chills.  Gastrointestinal: Positive for nausea and abdominal pain. Negative for vomiting, diarrhea and constipation.  Genitourinary: Positive for  pelvic pain. Negative for dysuria, urgency and frequency.   Physical Exam   Blood pressure 110/58, pulse 73, temperature 98.5 F (36.9 C), temperature source Oral, resp. rate 20, height 5\' 1"  (1.549 m), weight 80.74 kg (178 lb), last menstrual period 09/01/2015, not currently breastfeeding.  Physical Exam  Nursing note and vitals reviewed. Constitutional: She is oriented to person, place, and time. She appears well-developed and well-nourished. No distress.  HENT:  Head: Normocephalic.  Cardiovascular: Normal rate.   Respiratory: Effort normal.  GI: Soft. There is no tenderness. There is no rebound.  Neurological: She is alert and oriented to person, place, and time.  Skin: Skin is warm and dry.  Psychiatric: She has a normal mood and affect.   Results for orders placed or performed during the hospital encounter of 10/05/15 (from the past 24 hour(s))   Urinalysis, Routine w reflex microscopic (not at [facility])     Status: Abnormal   Collection Time: 10/05/15  9:21 PM  Result Value Ref Range   Color, Urine YELLOW YELLOW   APPearance CLEAR CLEAR   Specific Gravity, Urine 1.025 1.005 - 1.030   pH 6.0 5.0 - 8.0   Glucose, UA NEGATIVE NEGATIVE mg/dL   Hgb urine dipstick SMALL (A) NEGATIVE   Bilirubin Urine NEGATIVE NEGATIVE   Ketones, ur NEGATIVE NEGATIVE mg/dL   Protein, ur NEGATIVE NEGATIVE mg/dL   Nitrite NEGATIVE NEGATIVE   Leukocytes, UA NEGATIVE NEGATIVE  Urine microscopic-add on     Status: Abnormal   Collection Time: 10/05/15  9:21 PM  Result Value Ref Range   Squamous Epithelial / LPF 0-5 (A) NONE SEEN   WBC, UA 0-5 0 - 5 WBC/hpf   RBC / HPF 0-5 0 - 5 RBC/hpf   Bacteria, UA RARE (A) NONE SEEN   Trichomonas, UA PRESENT    Urine-Other MUCOUS PRESENT   CBC     Status: Abnormal   Collection Time: 10/05/15  9:49 PM  Result Value Ref Range   WBC 8.1 4.0 - 10.5 K/uL   RBC 3.68 (L) 3.87 - 5.11 MIL/uL   Hemoglobin 10.3 (L) 12.0 - 15.0 g/dL   HCT 31.3 (L) 36.0 - 46.0 %   MCV 85.1 78.0 - 100.0 fL   MCH 28.0 26.0 - 34.0 pg   MCHC 32.9 30.0 - 36.0 g/dL   RDW 13.9 11.5 - 15.5 %   Platelets 355 150 - 400 K/uL  Wet prep, genital     Status: Abnormal   Collection Time: 10/05/15  9:51 PM  Result Value Ref Range   Yeast Wet Prep HPF POC NONE SEEN NONE SEEN   Trich, Wet Prep PRESENT (A) NONE SEEN   Clue Cells Wet Prep HPF POC PRESENT (A) NONE SEEN   WBC, Wet Prep HPF POC FEW (A) NONE SEEN   Sperm NONE SEEN   Pregnancy, urine POC     Status: Abnormal   Collection Time: 10/05/15  9:53 PM  Result Value Ref Range   Preg Test, Ur POSITIVE (A) NEGATIVE   US Ob Comp Less 14 Wks  10/06/2015  CLINICAL DATA:  Pelvic pain for 2 days EXAM: OBSTETRIC <14 WK [place] AND TRANSVAGINAL OB US TECHNIQUE: Both transabdominal and transvaginal ultrasound examinations were performed for complete evaluation of the gestation as well as the maternal uterus, adnexal  regions, and pelvic cul-de-sac. Transvaginal technique was performed to assess early pregnancy. COMPARISON:  None. FINDINGS: Intrauterine gestational sac: Single Yolk sac:  Yes Embryo:  No Cardiac Activity: No Heart Rate:  bpm MSD: 13.6  mm   6 w   2  d CRL:    mm    w    d                  [place] EDC: Subchorionic hemorrhage:  None visualized. Maternal uterus/adnexae: Simple right ovarian cyst, 3.7 cm. Lower uterine segment calcified fibroid on the right, 4.3 cm. No free pelvic fluid. IMPRESSION: 1. Early intrauterine gestational sac with yolk sac but no fetal pole, or cardiac activity yet visualized. Recommend follow-up quantitative B-HCG levels and follow-up US in 14 days to confirm and assess viability. This recommendation follows SRU consensus guidelines: Diagnostic Criteria for Nonviable Pregnancy Early in the First Trimester. [place] Med 2013KT:048977. 2. Calcified fibroid in the lower uterine segment, 4.3 cm. Electronically Signed   By: Andreas Newport M.D.   On: 10/06/2015 01:59   US Ob Transvaginal  10/06/2015  CLINICAL DATA:  Pelvic pain for 2 days EXAM: OBSTETRIC <14 WK [place] AND TRANSVAGINAL OB US TECHNIQUE: Both transabdominal and transvaginal ultrasound examinations were performed for complete evaluation of the gestation as well as the maternal uterus, adnexal regions, and pelvic cul-de-sac. Transvaginal technique was performed to assess early pregnancy. COMPARISON:  None. FINDINGS: Intrauterine gestational sac: Single Yolk sac:  Yes Embryo:  No Cardiac Activity: No Heart Rate:   bpm MSD: 13.6  mm   6 w   2  d CRL:    mm    w    d                  [place] EDC: Subchorionic hemorrhage:  None visualized. Maternal uterus/adnexae: Simple right ovarian cyst, 3.7 cm. Lower uterine segment calcified fibroid on the right, 4.3 cm. No free pelvic fluid. IMPRESSION: 1. Early intrauterine gestational sac with yolk sac but no fetal pole, or cardiac activity yet visualized. Recommend follow-up quantitative B-HCG  levels and follow-up US in 14 days to confirm and assess viability. This recommendation follows SRU consensus guidelines: Diagnostic Criteria for Nonviable Pregnancy Early in the First Trimester. [place] [employer] Med 2013KT:048977. 2. Calcified fibroid in the lower uterine segment, 4.3 cm. Electronically Signed   By: Andreas Newport M.D.   On: 10/06/2015 01:59   MAU Course  Procedures  MDM Treated here in MAU with 2 g of flagyl.   Assessment and Plan   1. Trichimoniasis   2. Pelvic pain affecting pregnancy in first trimester, antepartum   3. Intrauterine pregnancy   4. [redacted] weeks gestation of pregnancy   5. Nausea/vomiting in pregnancy    DC home Comfort measures reviewed  1st Trimester precautions  RX: phenergan PRN #[id number]  Return to MAU as needed FU with OB as planned  Follow-up Information    Schedule an appointment as soon as possible for a visit with [facility].   Contact information:   [place] [address] [phone]        Mathis Bud 10/05/2015, 10:02 PM

## 2015-10-05 NOTE — Discharge Instructions (Signed)
Prenatal Care Providers [facility]  [phone]     Phone: [place]                      [facility]  @[place]     Phone: [phone]  Phone: [place]     Phone: [phone]  Phone: [place] [facility] [place]                hone: [phone]  Phone: [phone]         [facility] [address]      Phone: [phone]  Phone: [phone]         [place] Dept.                Phone: [phone]  [place])          Phone: [phone] [facility]   Phone: [phone] Safe Medications in Pregnancy   Acne: Benzoyl Peroxide Salicylic Acid  Backache/Headache: Tylenol: 2 regular strength every 4 hours OR              2 Extra strength every 6 hours  Colds/Coughs/Allergies: Benadryl (alcohol free) 25 mg every 6 hours as needed Breath right strips Claritin Cepacol throat lozenges Chloraseptic throat spray Cold-Eeze- up to three times per day Cough drops, alcohol free Flonase (by prescription only) Guaifenesin Mucinex Robitussin DM (plain only, alcohol free) Saline nasal spray/drops Sudafed (pseudoephedrine) & Actifed ** use only after [redacted] weeks gestation and if you do not have high blood pressure Tylenol Vicks Vaporub Zinc lozenges Zyrtec   Constipation: Colace Ducolax suppositories Fleet enema Glycerin suppositories Metamucil Milk of magnesia Miralax Senokot Smooth move tea  Diarrhea: Kaopectate Imodium A-D  *NO pepto Bismol  Hemorrhoids: Anusol Anusol HC Preparation H Tucks  Indigestion: Tums Maalox Mylanta Zantac  Pepcid  Insomnia: Benadryl (alcohol free) 25mg  every 6 hours as needed Tylenol  PM Unisom, no Gelcaps  Leg Cramps: Tums MagGel  Nausea/Vomiting:  Bonine Dramamine Emetrol Ginger extract Sea bands Meclizine  Nausea medication to take during pregnancy:  Unisom (doxylamine succinate 25 mg tablets) Take one tablet daily at bedtime. If symptoms are not adequately controlled, the dose can be increased to a maximum recommended dose of two tablets daily (1/2 tablet in the morning, 1/2 tablet mid-afternoon and one at bedtime). Vitamin B6 100mg  tablets. Take one tablet twice a day (up to 200 mg per day).  Skin Rashes: Aveeno products Benadryl cream or 25mg  every 6 hours as needed Calamine Lotion 1% cortisone cream  Yeast infection: Gyne-lotrimin 7 Monistat 7   **If taking multiple medications, please check labels to avoid duplicating the same active ingredients **take medication as directed on the label ** Do not exceed 4000 mg of tylenol in 24 hours **Do not take medications that contain aspirin or ibuprofen   Trichomoniasis Trichomoniasis is an infection caused by an organism called Trichomonas. The infection can affect both women and men. In women, the outer female genitalia and the vagina are affected. In men, the penis is mainly affected, but the prostate and other reproductive organs  can also be involved. Trichomoniasis is a sexually transmitted infection (STI) and is most often passed to another person through sexual contact.  RISK FACTORS  Having unprotected sexual intercourse.  Having sexual intercourse with an infected partner. SIGNS AND SYMPTOMS  Symptoms of trichomoniasis in women include:  Abnormal gray-green frothy vaginal discharge.  Itching and irritation of the vagina.  Itching and irritation of the area outside the vagina. Symptoms of trichomoniasis in men include:   Penile discharge with or without pain.  Pain during urination. This results from inflammation of the urethra. DIAGNOSIS  Trichomoniasis may be found during a Pap test  or physical exam. Your health care provider may use one of the following methods to help diagnose this infection:  Testing the pH of the vagina with a test tape.  Using a vaginal swab test that checks for the Trichomonas organism. A test is available that provides results within a few minutes.  Examining a urine sample.  Testing vaginal secretions. Your health care provider may test you for other STIs, including HIV. TREATMENT   You may be given medicine to fight the infection. Women should inform their health care provider if they could be or are pregnant. Some medicines used to treat the infection should not be taken during pregnancy.  Your health care provider may recommend over-the-counter medicines or creams to decrease itching or irritation.  Your sexual partner will need to be treated if infected.  Your health care provider may test you for infection again 3 months after treatment. HOME CARE INSTRUCTIONS   Take medicines only as directed by your health care provider.  Take over-the-counter medicine for itching or irritation as directed by your health care provider.  Do not have sexual intercourse while you have the infection.  Women should not douche or wear tampons while they have the infection.  Discuss your infection with your partner. Your partner may have gotten the infection from you, or you may have gotten it from your partner.  Have your sex partner get examined and treated if necessary.  Practice safe, informed, and protected sex.  See your health care provider for other STI testing. SEEK MEDICAL CARE IF:   You still have symptoms after you finish your medicine.  You develop abdominal pain.  You have pain when you urinate.  You have bleeding after sexual intercourse.  You develop a rash.  Your medicine makes you sick or makes you throw up (vomit). MAKE SURE YOU:  Understand these instructions.  Will watch your condition.  Will get help right away  if you are not doing well or get worse.   This information is not intended to replace advice given to you by your health care provider. Make sure you discuss any questions you have with your health care provider.   Document Released: 12/11/2000 Document Revised: 07/08/2014 Document Reviewed: 03/29/2013 Elsevier Interactive Patient Education [employer].

## 2015-10-06 LAB — RPR: RPR: NONREACTIVE

## 2015-10-06 LAB — HIV ANTIBODY (ROUTINE TESTING W REFLEX): HIV SCREEN 4TH GENERATION: NONREACTIVE

## 2015-10-06 LAB — GC/CHLAMYDIA PROBE AMP (~~LOC~~) NOT AT ARMC
CHLAMYDIA, DNA PROBE: NEGATIVE
NEISSERIA GONORRHEA: NEGATIVE

## 2015-10-16 ENCOUNTER — Inpatient Hospital Stay (HOSPITAL_COMMUNITY)
Admission: AD | Admit: 2015-10-16 | Discharge: 2015-10-16 | Disposition: A | Discharge status: Home / Self Care | Payer: 59 | Source: Ambulatory Visit | Attending: Family Medicine | Admitting: Family Medicine

## 2015-10-16 ENCOUNTER — Encounter (HOSPITAL_COMMUNITY): Payer: Self-pay | Admitting: *Deleted

## 2015-10-16 DIAGNOSIS — Z3A01 Less than 8 weeks gestation of pregnancy: Secondary | ICD-10-CM | POA: Insufficient documentation

## 2015-10-16 DIAGNOSIS — R109 Unspecified abdominal pain: Secondary | ICD-10-CM | POA: Insufficient documentation

## 2015-10-16 DIAGNOSIS — O4691 Antepartum hemorrhage, unspecified, first trimester: Secondary | ICD-10-CM

## 2015-10-16 DIAGNOSIS — O209 Hemorrhage in early pregnancy, unspecified: Secondary | ICD-10-CM | POA: Diagnosis present

## 2015-10-16 LAB — URINALYSIS, ROUTINE W REFLEX MICROSCOPIC
Bilirubin Urine: NEGATIVE
Glucose, UA: NEGATIVE mg/dL
KETONES UR: NEGATIVE mg/dL
Leukocytes, UA: NEGATIVE
NITRITE: NEGATIVE
PROTEIN: NEGATIVE mg/dL
Specific Gravity, Urine: >1.03 — ABNORMAL HIGH (ref 1.005–1.030)
pH: 5.5 (ref 5.0–8.0)

## 2015-10-16 LAB — URINE MICROSCOPIC-ADD ON: WBC UA: NONE SEEN WBC/hpf (ref 0–5)

## 2015-10-16 NOTE — MAU Provider Note (Signed)
History   [id number] @ 6.3 wks in with c/o vag bleeding two days ago and abd pain which is now better but is concerned she has lost pregnancy.  CSN: [id number]  Arrival date & time 10/16/15  1721   First Provider Initiated Contact with Patient 10/16/15 1838      Chief Complaint  Patient presents with  . Abdominal Pain    HPI  Past Medical History  Diagnosis Date  . Gonorrhea   . Chlamydia   . Arrhythmia   . Hx of migraine headaches   . Headache(784.0)   . Anemia     first and sec pregnancies  . Fibroid   . Leiomyoma of uterus in pregnancy   . Hemorrhoids   . Gestational diabetes     diet controlled    Past Surgical History  Procedure Laterality Date  . Wisdom tooth extraction    . Induced abortion  March  2 ,2013    Family History  Problem Relation Age of Onset  . Other Neg Hx   . Alcohol abuse Neg Hx   . Arthritis Neg Hx   . Asthma Neg Hx   . Birth defects Neg Hx   . Cancer Neg Hx   . COPD Neg Hx   . Depression Neg Hx   . Diabetes Neg Hx   . Drug abuse Neg Hx   . Early death Neg Hx   . Heart disease Neg Hx   . Hearing loss Neg Hx   . Hyperlipidemia Neg Hx   . Hypertension Neg Hx   . Kidney disease Neg Hx   . Learning disabilities Neg Hx   . Mental illness Neg Hx   . Mental retardation Neg Hx   . Miscarriages / Stillbirths Neg Hx   . Stroke Neg Hx   . Vision loss Neg Hx     Social History  Substance Use Topics  . Smoking status: Never Smoker   . Smokeless tobacco: Never Used  . Alcohol Use: Yes     Comment: Every other weekend.     OB History    Gravida Para Term Preterm AB TAB SAB Ectopic Multiple Living   6 4 4  0 1 1 0 0 0 4      Review of Systems  Constitutional: Negative.   HENT: Negative.   Eyes: Negative.   Respiratory: Negative.   Cardiovascular: Negative.   Gastrointestinal: Negative.   Endocrine: Negative.   Genitourinary: Negative.   Musculoskeletal: Negative.   Skin: Negative.   Allergic/Immunologic: Negative.    Neurological: Negative.   Hematological: Negative.   Psychiatric/Behavioral: Negative.     Allergies  Review of patient's allergies indicates no known allergies.  Home Medications  No current outpatient prescriptions on file.  BP 106/53 mmHg  Pulse 83  Temp(Src) 98.3 F (36.8 C) (Oral)  Resp 16  LMP 09/01/2015  Physical Exam  Constitutional: She is oriented to person, place, and time. She appears well-developed and well-nourished.  HENT:  Head: Normocephalic.  Eyes: Pupils are equal, round, and reactive to light.  Neck: Normal range of motion.  Cardiovascular: Normal rate, regular rhythm, normal heart sounds and intact distal pulses.   Pulmonary/Chest: Effort normal and breath sounds normal.  Abdominal: Soft. Bowel sounds are normal.  Genitourinary: Vagina normal and uterus normal.  Musculoskeletal: Normal range of motion.  Neurological: She is alert and oriented to person, place, and time. She has normal reflexes.  Skin: Skin is warm and dry.  Psychiatric: She has  a normal mood and affect. Her behavior is normal. Judgment and thought content normal.    MAU Course  Procedures (including critical care time)  Labs Reviewed  URINALYSIS, ROUTINE W REFLEX MICROSCOPIC (NOT AT [facility]) - Abnormal; Notable for the following:    Specific Gravity, Urine >1.030 (*)    Hgb urine dipstick MODERATE (*)    All other components within normal limits  URINE MICROSCOPIC-ADD ON - Abnormal; Notable for the following:    Squamous Epithelial / LPF 0-5 (*)    Bacteria, UA RARE (*)    All other components within normal limits   No results found.   No diagnosis found.    MDM  Bedside vag probe u/s shows viable IUP with FHR 160's. No active bleeding at present time. Desires d/c home

## 2015-10-16 NOTE — MAU Note (Signed)
Pt C/O pain around navel on Thursday, started having heavy bleeding & passing clots.  The next day the bleeding was less & was brown, no bleeding at all now.  Still having mild lower abd pain.

## 2015-10-16 NOTE — Discharge Instructions (Signed)

## 2016-01-22 ENCOUNTER — Emergency Department (HOSPITAL_COMMUNITY)
Admission: EM | Admit: 2016-01-22 | Discharge: 2016-01-23 | Disposition: A | Discharge status: Home / Self Care | Payer: 59 | Attending: Dermatology | Admitting: Dermatology

## 2016-01-22 ENCOUNTER — Encounter (HOSPITAL_COMMUNITY): Payer: Self-pay | Admitting: Emergency Medicine

## 2016-01-22 ENCOUNTER — Emergency Department (HOSPITAL_COMMUNITY): Payer: 59

## 2016-01-22 DIAGNOSIS — M549 Dorsalgia, unspecified: Secondary | ICD-10-CM | POA: Diagnosis not present

## 2016-01-22 DIAGNOSIS — Z5321 Procedure and treatment not carried out due to patient leaving prior to being seen by health care provider: Secondary | ICD-10-CM | POA: Insufficient documentation

## 2016-01-22 DIAGNOSIS — R079 Chest pain, unspecified: Secondary | ICD-10-CM | POA: Diagnosis present

## 2016-01-22 LAB — CBC
HCT: 33 % — ABNORMAL LOW (ref 36.0–46.0)
Hemoglobin: 10.5 g/dL — ABNORMAL LOW (ref 12.0–15.0)
MCH: 27.5 pg (ref 26.0–34.0)
MCHC: 31.8 g/dL (ref 30.0–36.0)
MCV: 86.4 fL (ref 78.0–100.0)
PLATELETS: 327 10*3/uL (ref 150–400)
RBC: 3.82 MIL/uL — AB (ref 3.87–5.11)
RDW: 13.7 % (ref 11.5–15.5)
WBC: 7.3 10*3/uL (ref 4.0–10.5)

## 2016-01-22 LAB — BASIC METABOLIC PANEL
Anion gap: 6 (ref 5–15)
BUN: 15 mg/dL (ref 6–20)
CO2: 23 mmol/L (ref 22–32)
CREATININE: 0.62 mg/dL (ref 0.44–1.00)
Calcium: 9.1 mg/dL (ref 8.9–10.3)
Chloride: 109 mmol/L (ref 101–111)
Glucose, Bld: 118 mg/dL — ABNORMAL HIGH (ref 65–99)
Potassium: 3.7 mmol/L (ref 3.5–5.1)
SODIUM: 138 mmol/L (ref 135–145)

## 2016-01-22 LAB — I-STAT TROPONIN, ED: TROPONIN I, POC: 0 ng/mL (ref 0.00–0.08)

## 2016-01-22 NOTE — ED Triage Notes (Signed)
Patient c/o intermittent chest pain x several weeks and right sided back pain that started yesterday.  Patient states that when she laughs the pain comes really quick esp when patient states she laughs really hard.

## 2016-02-22 ENCOUNTER — Encounter (HOSPITAL_COMMUNITY): Payer: Self-pay | Admitting: Emergency Medicine

## 2016-02-22 ENCOUNTER — Emergency Department (HOSPITAL_COMMUNITY): Payer: 59

## 2016-02-22 ENCOUNTER — Emergency Department (HOSPITAL_COMMUNITY)
Admission: EM | Admit: 2016-02-22 | Discharge: 2016-02-22 | Disposition: A | Discharge status: Home / Self Care | Payer: 59 | Attending: Emergency Medicine | Admitting: Emergency Medicine

## 2016-02-22 DIAGNOSIS — Z791 Long term (current) use of non-steroidal anti-inflammatories (NSAID): Secondary | ICD-10-CM | POA: Insufficient documentation

## 2016-02-22 DIAGNOSIS — Z8719 Personal history of other diseases of the digestive system: Secondary | ICD-10-CM | POA: Diagnosis not present

## 2016-02-22 DIAGNOSIS — R072 Precordial pain: Secondary | ICD-10-CM | POA: Insufficient documentation

## 2016-02-22 DIAGNOSIS — N9489 Other specified conditions associated with female genital organs and menstrual cycle: Secondary | ICD-10-CM | POA: Insufficient documentation

## 2016-02-22 DIAGNOSIS — R0602 Shortness of breath: Secondary | ICD-10-CM | POA: Diagnosis present

## 2016-02-22 DIAGNOSIS — R079 Chest pain, unspecified: Secondary | ICD-10-CM

## 2016-02-22 LAB — I-STAT BETA HCG BLOOD, ED (MC, WL, AP ONLY): I-stat hCG, quantitative: 7.6 m[IU]/mL — ABNORMAL HIGH

## 2016-02-22 LAB — COMPREHENSIVE METABOLIC PANEL WITH GFR
ALT: 16 U/L (ref 14–54)
AST: 15 U/L (ref 15–41)
Albumin: 4.4 g/dL (ref 3.5–5.0)
Alkaline Phosphatase: 52 U/L (ref 38–126)
Anion gap: 5 (ref 5–15)
BUN: 12 mg/dL (ref 6–20)
CO2: 26 mmol/L (ref 22–32)
Calcium: 9.3 mg/dL (ref 8.9–10.3)
Chloride: 107 mmol/L (ref 101–111)
Creatinine, Ser: 0.38 mg/dL — ABNORMAL LOW (ref 0.44–1.00)
GFR calc Af Amer: >60 mL/min
GFR calc non Af Amer: >60 mL/min
Glucose, Bld: 93 mg/dL (ref 65–99)
Potassium: 3.9 mmol/L (ref 3.5–5.1)
Sodium: 138 mmol/L (ref 135–145)
Total Bilirubin: 0.9 mg/dL (ref 0.3–1.2)
Total Protein: 8 g/dL (ref 6.5–8.1)

## 2016-02-22 LAB — CBC
HEMATOCRIT: 34.1 % — AB (ref 36.0–46.0)
Hemoglobin: 11.3 g/dL — ABNORMAL LOW (ref 12.0–15.0)
MCH: 28.4 pg (ref 26.0–34.0)
MCHC: 33.1 g/dL (ref 30.0–36.0)
MCV: 85.7 fL (ref 78.0–100.0)
PLATELETS: 386 10*3/uL (ref 150–400)
RBC: 3.98 MIL/uL (ref 3.87–5.11)
RDW: 14.4 % (ref 11.5–15.5)
WBC: 6.6 10*3/uL (ref 4.0–10.5)

## 2016-02-22 LAB — I-STAT TROPONIN, ED
TROPONIN I, POC: 0 ng/mL (ref 0.00–0.08)
Troponin i, poc: 0 ng/mL (ref 0.00–0.08)

## 2016-02-22 LAB — HCG, QUANTITATIVE, PREGNANCY: HCG, BETA CHAIN, QUANT, S: 7 m[IU]/mL — AB (ref <5)

## 2016-02-22 LAB — D-DIMER, QUANTITATIVE: D-Dimer, Quant: 0.27 ug/mL-FEU (ref 0.00–0.50)

## 2016-02-22 MED ORDER — CYCLOBENZAPRINE HCL 10 MG PO TABS: 10.0000 mg | ORAL_TABLET | Freq: Three times a day (TID) | ORAL | 0 refills | Status: DC | PRN

## 2016-02-22 MED ORDER — GI COCKTAIL ~~LOC~~
30.0000 mL | Freq: Once | ORAL | Status: AC
  Administered 2016-02-22: 30 mL via ORAL
  Filled 2016-02-22: qty 30

## 2016-02-22 MED ORDER — CYCLOBENZAPRINE HCL 10 MG PO TABS
10.0000 mg | ORAL_TABLET | Freq: Once | ORAL | Status: AC
  Administered 2016-02-22: 10 mg via ORAL
  Filled 2016-02-22: qty 1

## 2016-02-22 MED ORDER — OMEPRAZOLE 20 MG PO CPDR: 20.0000 mg | DELAYED_RELEASE_CAPSULE | Freq: Every day | ORAL | 0 refills | Status: DC

## 2016-02-22 MED ORDER — HYDROCODONE-ACETAMINOPHEN 5-325 MG PO TABS
1.0000 | ORAL_TABLET | Freq: Once | ORAL | Status: AC
  Administered 2016-02-22: 1 via ORAL
  Filled 2016-02-22: qty 1

## 2016-02-22 NOTE — ED Triage Notes (Signed)
Patient c/o central chest pain that has been intermittent over the past couple days. Patient states that pain hurts when laugh, or sometimes when talking while have to stop bc pain hurting so bad.

## 2016-02-22 NOTE — Progress Notes (Signed)
Entered in d/c instructions Please use the information on back of your insurance card to find an in network doctor for follow up care     Please go to website on insurance card, locate find a doctor area in top right corner of page to use to find in network primary care provider & specialists or call the toll free number on the back of insurance card to speak with a customer service staff    Next Steps: Schedule an appointment as soon as possible for a visit today

## 2016-02-22 NOTE — Progress Notes (Signed)
[facility] ED CM noted pt with coverage but no pcp listed Spoke with pt who confirms no pcp  [facility] ED CM spoke with pt on how to obtain an in network pcp with insurance coverage via the customer service number or web site  Cm reviewed ED level of care for crisis/emergent services and community pcp level of care to manage continuous or chronic medical concerns.  The pt voiced understanding CM encouraged pt and discussed pt's responsibility to verify with pt's insurance carrier that any recommended medical provider offered by any emergency room or a hospital provider is within the carrier's network. The pt voiced understanding      

## 2016-02-22 NOTE — ED Provider Notes (Signed)
[facility] DEPT Provider Note   CSN: [id number] Arrival date & time: 02/22/16  1026     History   Chief Complaint Chief Complaint  Patient presents with  . Chest Pain    HPI Sonia Allen is a 30 y.o. female.  The history is provided by the patient and medical records.  Chest Pain   This is a recurrent problem. The current episode started more than 2 days ago. The problem occurs daily. The problem has not changed since onset.The pain is associated with movement. The pain is present in the substernal region. The pain is at a severity of 6/10. The pain is moderate. The quality of the pain is described as brief and pressure-like. The pain does not radiate. The symptoms are aggravated by certain positions. Associated symptoms include irregular heartbeat and shortness of breath. Pertinent negatives include no abdominal pain, no cough, no fever, no headaches, no nausea, no vomiting and no weakness. She has tried nothing for the symptoms. The treatment provided no relief. There are no known risk factors.  Pertinent negatives for past medical history include no aortic aneurysm, no aortic dissection, no CAD, no connective tissue disease, no COPD, no CHF and no sickle cell disease.  Her family medical history is significant for heart disease.  Pertinent negatives for family medical history include: no PE.    Past Medical History:  Diagnosis Date  . Anemia    first and sec pregnancies  . Arrhythmia   . Chlamydia   . Fibroid   . Gestational diabetes    diet controlled  . Gonorrhea   . Headache(784.0)   . Hemorrhoids   . Hx of migraine headaches   . Leiomyoma of uterus in pregnancy     Patient Active Problem List   Diagnosis Date Noted  . Pregnancy 03/12/2014  . Marginal insertion of umbilical cord [id number]  . NSVD (normal spontaneous vaginal delivery) 03/12/2014  . Gestational diabetes mellitus, antepartum 01/17/2014  . Uterine fibroids affecting pregnancy 10/14/2012  .  Anemia 10/14/2012  . HEADACHE 02/18/2007    Past Surgical History:  Procedure Laterality Date  . INDUCED ABORTION  March  2 ,2013  . WISDOM TOOTH EXTRACTION      OB History    Gravida Para Term Preterm AB Living   6 4 4  0 1 4   SAB TAB Ectopic Multiple Live Births   0 1 0 0 4       Home Medications    Prior to Admission medications   Medication Sig Start Date End Date Taking? Authorizing Provider  ibuprofen (ADVIL,MOTRIN) 200 MG tablet Take 200 mg by mouth daily as needed for mild pain or moderate pain.   Yes Historical Provider, MD  nitrofurantoin, macrocrystal-monohydrate, (MACROBID) 100 MG capsule Take 1 capsule by mouth 2 (two) times daily. 02/14/16  Yes Historical Provider, MD  cyclobenzaprine (FLEXERIL) 10 MG tablet Take 1 tablet (10 mg total) by mouth 3 (three) times daily as needed for muscle spasms. 02/22/16   Duffy Bruce, MD  omeprazole (PRILOSEC) 20 MG capsule Take 1 capsule (20 mg total) by mouth daily. Take in the morning 30 minutes before eating. 02/22/16   Duffy Bruce, MD  promethazine (PHENERGAN) 25 MG tablet Take 0.5-1 tablets (12.5-25 mg total) by mouth every 6 (six) hours as needed. Patient not taking: Reported on 02/22/2016 10/05/15   Tresea Mall, CNM    Family History Family History  Problem Relation Age of Onset  . Other Neg Hx   .  Alcohol abuse Neg Hx   . Arthritis Neg Hx   . Asthma Neg Hx   . Birth defects Neg Hx   . Cancer Neg Hx   . COPD Neg Hx   . Depression Neg Hx   . Diabetes Neg Hx   . Drug abuse Neg Hx   . Early death Neg Hx   . Heart disease Neg Hx   . Hearing loss Neg Hx   . Hyperlipidemia Neg Hx   . Hypertension Neg Hx   . Kidney disease Neg Hx   . Learning disabilities Neg Hx   . Mental illness Neg Hx   . Mental retardation Neg Hx   . Miscarriages / Stillbirths Neg Hx   . Stroke Neg Hx   . Vision loss Neg Hx     Social History Social History  Substance Use Topics  . Smoking status: Never Smoker  . Smokeless tobacco:  Never Used  . Alcohol use Yes     Comment: Every other weekend.      Allergies   Review of patient's allergies indicates no known allergies.   Review of Systems Review of Systems  Constitutional: Positive for fatigue. Negative for chills and fever.  HENT: Negative for congestion, rhinorrhea and sore throat.   Eyes: Negative for visual disturbance.  Respiratory: Positive for shortness of breath. Negative for cough and wheezing.   Cardiovascular: Positive for chest pain. Negative for leg swelling.  Gastrointestinal: Negative for abdominal pain, diarrhea, nausea and vomiting.  Genitourinary: Negative for dysuria, flank pain, vaginal bleeding and vaginal discharge.  Musculoskeletal: Negative for neck pain.  Skin: Negative for rash.  Allergic/Immunologic: Negative for immunocompromised state.  Neurological: Negative for syncope, weakness and headaches.  Hematological: Does not bruise/bleed easily.  All other systems reviewed and are negative.    Physical Exam Updated Vital Signs BP 113/73 (BP Location: Right Arm)   Pulse 74   Temp 97.8 F (36.6 C) (Oral)   Resp 20   Ht 5\' 4"  (1.626 m)   LMP 01/26/2016   SpO2 100%   Breastfeeding? Unknown   Physical Exam  Constitutional: She is oriented to person, place, and time. She appears well-developed and well-nourished. No distress.  HENT:  Head: Normocephalic and atraumatic.  Eyes: Conjunctivae are normal.  Neck: Neck supple.  Cardiovascular: Normal rate, regular rhythm and normal heart sounds.  Exam reveals no friction rub.   No murmur heard. Pulmonary/Chest: Effort normal and breath sounds normal. No respiratory distress. She has no wheezes. She has no rales.  Abdominal: She exhibits no distension.  Musculoskeletal: She exhibits no edema.  Neurological: She is alert and oriented to person, place, and time. She exhibits normal muscle tone.  Skin: Skin is warm. Capillary refill takes less than 2 seconds.  Psychiatric: She has a  normal mood and affect.  Nursing note and vitals reviewed.    ED Treatments / Results  Labs (all labs ordered are listed, but only abnormal results are displayed) Labs Reviewed  CBC - Abnormal; Notable for the following:       Result Value   Hemoglobin 11.3 (*)    HCT 34.1 (*)    All other components within normal limits  COMPREHENSIVE METABOLIC PANEL - Abnormal; Notable for the following:    Creatinine, Ser 0.38 (*)    All other components within normal limits  HCG, QUANTITATIVE, PREGNANCY - Abnormal; Notable for the following:    hCG, Beta Chain, Quant, S 7 (*)    All other components  within normal limits  I-STAT BETA HCG BLOOD, ED ([facility], [facility], [facility] ONLY) - Abnormal; Notable for the following:    I-stat hCG, quantitative 7.6 (*)    All other components within normal limits  D-DIMER, QUANTITATIVE (NOT AT [facility])  Randolm Idol, ED  Randolm Idol, ED    EKG  EKG Interpretation  Date/Time:  Thursday February 22 2016 10:33:44 EDT Ventricular Rate:  82 PR Interval:    QRS Duration: 77 QT Interval:  355 QTC Calculation: 415 R Axis:   43 Text Interpretation:  Sinus rhythm Low voltage, precordial leads Borderline T abnormalities, anterior leads Similar t-wave changes as prior EKG  Confirmed by LIU MD, DANA [phone]) on 02/22/2016 10:38:36 AM       Radiology Dg Chest 2 View  Result Date: 02/22/2016 CLINICAL DATA:  Chest pain short of breath EXAM: CHEST  2 VIEW COMPARISON:  03/07/2014 FINDINGS: The heart size and mediastinal contours are within normal limits. Both lungs are clear. The visualized skeletal structures are unremarkable. IMPRESSION: No active cardiopulmonary disease. Electronically Signed   By: Franchot Gallo M.D.   On: 02/22/2016 11:49    Procedures Procedures (including critical care time)  Medications Ordered in ED Medications  gi cocktail (Maalox,Lidocaine,Donnatal) (30 mLs Oral Given 02/22/16 1244)  cyclobenzaprine (FLEXERIL) tablet 10 mg (10 mg Oral Given  02/22/16 1333)  HYDROcodone-acetaminophen (NORCO/VICODIN) 5-325 MG per tablet 1 tablet (1 tablet Oral Given 02/22/16 1333)     Initial Impression / Assessment and Plan / ED Course  I have reviewed the triage vital signs and the nursing notes.  Pertinent labs & imaging results that were available during my care of the patient were reviewed by me and considered in my medical decision making (see chart for details).  Clinical Course   30 yo AAF with PMHx of recent SAB who p/w intermittent, atypical chest pain. VSS and WNL. Exam is as above. Etiology unclear at this time - suspect MSK chest pain as it is positional, versus GERD as she does have some burning component and relation to food. She has no h/o heart disease and HEART score is <3 - will plan for delta trop. EKG is unremarkable. No tachypnea, tahcycardia but given recent miscarriage, will check D-Dimer for evaluation of PE. Otherwise, will give symptomatic treatment and re-assess.  Labs, imaging are as above. CBC, BMP unremarkable. Trop negative x 2 - doubt ACS. CXR is clear. Of note, pt has hCG of 7. This is likely due to downtrending in setting of recent SAB. She has no abdominal pain currently. However, DDx includes very early pregnancy, retained products, possibly trophoblastic remnants. Will advise her to f/u with her OBGYN this week for repeat lab work. Otherwise, D-dimer negative. Sx improved with GI cocktail. Will give trial of PPI, also flexeril. Advised against NSAIDs in setting of ? Pregnancy. Pt in agreement. Return precautions given.  Final Clinical Impressions(s) / ED Diagnoses   Final diagnoses:  Chest pain, unspecified chest pain type    New Prescriptions Discharge Medication List as of 02/22/2016  4:00 PM    START taking these medications   Details  cyclobenzaprine (FLEXERIL) 10 MG tablet Take 1 tablet (10 mg total) by mouth 3 (three) times daily as needed for muscle spasms., Starting Thu 02/22/2016, Print    omeprazole  (PRILOSEC) 20 MG capsule Take 1 capsule (20 mg total) by mouth daily. Take in the morning 30 minutes before eating., Starting Thu 02/22/2016, Print         Duffy Bruce,  MD 02/23/16 [id number]

## 2016-06-01 ENCOUNTER — Encounter (HOSPITAL_COMMUNITY): Payer: Self-pay | Admitting: Emergency Medicine

## 2016-06-01 ENCOUNTER — Emergency Department (HOSPITAL_COMMUNITY)
Admission: EM | Admit: 2016-06-01 | Discharge: 2016-06-01 | Disposition: A | Discharge status: Home / Self Care | Payer: 59 | Attending: Emergency Medicine | Admitting: Emergency Medicine

## 2016-06-01 DIAGNOSIS — A5901 Trichomonal vulvovaginitis: Secondary | ICD-10-CM

## 2016-06-01 DIAGNOSIS — N760 Acute vaginitis: Secondary | ICD-10-CM

## 2016-06-01 DIAGNOSIS — Z8542 Personal history of malignant neoplasm of other parts of uterus: Secondary | ICD-10-CM | POA: Insufficient documentation

## 2016-06-01 DIAGNOSIS — N939 Abnormal uterine and vaginal bleeding, unspecified: Secondary | ICD-10-CM | POA: Diagnosis present

## 2016-06-01 DIAGNOSIS — N921 Excessive and frequent menstruation with irregular cycle: Secondary | ICD-10-CM | POA: Diagnosis not present

## 2016-06-01 DIAGNOSIS — Z86018 Personal history of other benign neoplasm: Secondary | ICD-10-CM

## 2016-06-01 DIAGNOSIS — B9689 Other specified bacterial agents as the cause of diseases classified elsewhere: Secondary | ICD-10-CM

## 2016-06-01 DIAGNOSIS — Z79899 Other long term (current) drug therapy: Secondary | ICD-10-CM | POA: Diagnosis not present

## 2016-06-01 LAB — BASIC METABOLIC PANEL
ANION GAP: 8 (ref 5–15)
BUN: 9 mg/dL (ref 6–20)
CHLORIDE: 105 mmol/L (ref 101–111)
CO2: 23 mmol/L (ref 22–32)
Calcium: 8.9 mg/dL (ref 8.9–10.3)
Creatinine, Ser: 0.61 mg/dL (ref 0.44–1.00)
GFR calc Af Amer: >60 mL/min (ref >60)
GFR calc non Af Amer: >60 mL/min (ref >60)
GLUCOSE: 101 mg/dL — AB (ref 65–99)
POTASSIUM: 3.6 mmol/L (ref 3.5–5.1)
Sodium: 136 mmol/L (ref 135–145)

## 2016-06-01 LAB — URINE MICROSCOPIC-ADD ON

## 2016-06-01 LAB — I-STAT BETA HCG BLOOD, ED (MC, WL, AP ONLY): I-stat hCG, quantitative: <5 m[IU]/mL (ref <5)

## 2016-06-01 LAB — CBC WITH DIFFERENTIAL/PLATELET
BASOS ABS: 0 10*3/uL (ref 0.0–0.1)
Basophils Relative: 0 %
Eosinophils Absolute: 0 10*3/uL (ref 0.0–0.7)
Eosinophils Relative: 0 %
HEMATOCRIT: 33.1 % — AB (ref 36.0–46.0)
HEMOGLOBIN: 10.9 g/dL — AB (ref 12.0–15.0)
LYMPHS PCT: 31 %
Lymphs Abs: 2.2 10*3/uL (ref 0.7–4.0)
MCH: 28.1 pg (ref 26.0–34.0)
MCHC: 32.9 g/dL (ref 30.0–36.0)
MCV: 85.3 fL (ref 78.0–100.0)
MONO ABS: 0.3 10*3/uL (ref 0.1–1.0)
MONOS PCT: 4 %
NEUTROS ABS: 4.6 10*3/uL (ref 1.7–7.7)
NEUTROS PCT: 65 %
Platelets: 394 10*3/uL (ref 150–400)
RBC: 3.88 MIL/uL (ref 3.87–5.11)
RDW: 13.8 % (ref 11.5–15.5)
WBC: 7.1 10*3/uL (ref 4.0–10.5)

## 2016-06-01 LAB — URINALYSIS, ROUTINE W REFLEX MICROSCOPIC
Bilirubin Urine: NEGATIVE
Glucose, UA: NEGATIVE mg/dL
Ketones, ur: NEGATIVE mg/dL
Nitrite: POSITIVE — AB
Protein, ur: NEGATIVE mg/dL
SPECIFIC GRAVITY, URINE: 1.025 (ref 1.005–1.030)
pH: 5.5 (ref 5.0–8.0)

## 2016-06-01 LAB — WET PREP, GENITAL
SPERM: NONE SEEN
YEAST WET PREP: NONE SEEN

## 2016-06-01 MED ORDER — METRONIDAZOLE 500 MG PO TABS: 500.0000 mg | ORAL_TABLET | Freq: Two times a day (BID) | ORAL | 0 refills | Status: DC

## 2016-06-01 MED ORDER — IBUPROFEN 600 MG PO TABS: 600.0000 mg | ORAL_TABLET | Freq: Four times a day (QID) | ORAL | 0 refills | Status: DC | PRN

## 2016-06-01 MED ORDER — KETOROLAC TROMETHAMINE 30 MG/ML IJ SOLN
30.0000 mg | Freq: Once | INTRAMUSCULAR | Status: AC
  Administered 2016-06-01: 30 mg via INTRAVENOUS
  Filled 2016-06-01: qty 1

## 2016-06-01 MED ORDER — SODIUM CHLORIDE 0.9 % IV BOLUS (SEPSIS)
1000.0000 mL | Freq: Once | INTRAVENOUS | Status: AC
  Administered 2016-06-01: 1000 mL via INTRAVENOUS

## 2016-06-01 MED ORDER — METRONIDAZOLE 500 MG PO TABS
2000.0000 mg | ORAL_TABLET | Freq: Once | ORAL | Status: AC
  Administered 2016-06-01: 2000 mg via ORAL
  Filled 2016-06-01: qty 4

## 2016-06-01 MED ORDER — MEDROXYPROGESTERONE ACETATE 10 MG PO TABS: 10.0000 mg | ORAL_TABLET | Freq: Every day | ORAL | 0 refills | Status: DC

## 2016-06-01 NOTE — ED Triage Notes (Signed)
Pt complaint of lower abdominal pain with associated heavy vaginal bleeding onset Thursday. Pt reports soaking pads every 2 hours.

## 2016-06-01 NOTE — ED Provider Notes (Signed)
[facility] DEPT Provider Note   CSN: [id number] Arrival date & time: 06/01/16  1127     History   Chief Complaint Chief Complaint  Patient presents with  . Vaginal Bleeding    HPI Sonia Allen is a 30 y.o. female.  Pt presents to the ED today with heavy vaginal bleeding and cramping.  Her last period was 2 weeks ago.  The pt is wearing both pads and tampons and is having to change them every 2 hours.  She said sx started a few days ago.  The pt denies f/c.      Past Medical History:  Diagnosis Date  . Anemia    first and sec pregnancies  . Arrhythmia   . Chlamydia   . Fibroid   . Gestational diabetes    diet controlled  . Gonorrhea   . Headache(784.0)   . Hemorrhoids   . Hx of migraine headaches   . Leiomyoma of uterus in pregnancy     Patient Active Problem List   Diagnosis Date Noted  . Pregnancy 03/12/2014  . Marginal insertion of umbilical cord [id number]  . NSVD (normal spontaneous vaginal delivery) 03/12/2014  . Gestational diabetes mellitus, antepartum 01/17/2014  . Uterine fibroids affecting pregnancy 10/14/2012  . Anemia 10/14/2012  . HEADACHE 02/18/2007    Past Surgical History:  Procedure Laterality Date  . INDUCED ABORTION  March  2 ,2013  . WISDOM TOOTH EXTRACTION      OB History    Gravida Para Term Preterm AB Living   6 4 4  0 1 4   SAB TAB Ectopic Multiple Live Births   0 1 0 0 4       Home Medications    Prior to Admission medications   Medication Sig Start Date End Date Taking? Authorizing Provider  cyclobenzaprine (FLEXERIL) 10 MG tablet Take 1 tablet (10 mg total) by mouth 3 (three) times daily as needed for muscle spasms. 02/22/16   Duffy Bruce, MD  ibuprofen (ADVIL,MOTRIN) 600 MG tablet Take 1 tablet (600 mg total) by mouth every 6 (six) hours as needed. 06/01/16   Isla Pence, MD  medroxyPROGESTERone (PROVERA) 10 MG tablet Take 1 tablet (10 mg total) by mouth daily. 06/01/16   Isla Pence, MD  metroNIDAZOLE  (FLAGYL) 500 MG tablet Take 1 tablet (500 mg total) by mouth 2 (two) times daily. 06/01/16   Isla Pence, MD  nitrofurantoin, macrocrystal-monohydrate, (MACROBID) 100 MG capsule Take 1 capsule by mouth 2 (two) times daily. 02/14/16   Historical Provider, MD  omeprazole (PRILOSEC) 20 MG capsule Take 1 capsule (20 mg total) by mouth daily. Take in the morning 30 minutes before eating. 02/22/16   Duffy Bruce, MD  promethazine (PHENERGAN) 25 MG tablet Take 0.5-1 tablets (12.5-25 mg total) by mouth every 6 (six) hours as needed. Patient not taking: Reported on 02/22/2016 10/05/15   Tresea Mall, CNM    Family History Family History  Problem Relation Age of Onset  . Other Neg Hx   . Alcohol abuse Neg Hx   . Arthritis Neg Hx   . Asthma Neg Hx   . Birth defects Neg Hx   . Cancer Neg Hx   . COPD Neg Hx   . Depression Neg Hx   . Diabetes Neg Hx   . Drug abuse Neg Hx   . Early death Neg Hx   . Heart disease Neg Hx   . Hearing loss Neg Hx   . Hyperlipidemia Neg Hx   .  Hypertension Neg Hx   . Kidney disease Neg Hx   . Learning disabilities Neg Hx   . Mental illness Neg Hx   . Mental retardation Neg Hx   . Miscarriages / Stillbirths Neg Hx   . Stroke Neg Hx   . Vision loss Neg Hx     Social History Social History  Substance Use Topics  . Smoking status: Never Smoker  . Smokeless tobacco: Never Used  . Alcohol use Yes     Comment: Every other weekend.      Allergies   Patient has no known allergies.   Review of Systems Review of Systems  Gastrointestinal: Positive for abdominal pain.  Genitourinary: Positive for vaginal bleeding.  All other systems reviewed and are negative.    Physical Exam Updated Vital Signs BP 128/97 (BP Location: Right Arm)   Pulse 105   Temp 98.8 F (37.1 C) (Oral)   Resp 18   Ht 5\' 3"  (1.6 m)   Wt 179 lb (81.2 kg)   LMP 05/30/2016   SpO2 100%   BMI 31.71 kg/m   Physical Exam  Constitutional: She is oriented to person, place, and time.  She appears well-developed and well-nourished.  HENT:  Head: Normocephalic and atraumatic.  Right Ear: External ear normal.  Left Ear: External ear normal.  Nose: Nose normal.  Mouth/Throat: Oropharynx is clear and moist.  Eyes: Conjunctivae and EOM are normal. Pupils are equal, round, and reactive to light.  Neck: Normal range of motion. Neck supple.  Cardiovascular: Normal rate, regular rhythm, normal heart sounds and intact distal pulses.   Pulmonary/Chest: Effort normal and breath sounds normal.  Abdominal: Soft. Bowel sounds are normal. There is tenderness in the suprapubic area.  Musculoskeletal: Normal range of motion.  Neurological: She is alert and oriented to person, place, and time.  Skin: Skin is warm and dry.  Psychiatric: She has a normal mood and affect. Her behavior is normal. Judgment and thought content normal.  Nursing note and vitals reviewed.    ED Treatments / Results  Labs (all labs ordered are listed, but only abnormal results are displayed) Labs Reviewed  WET PREP, GENITAL - Abnormal; Notable for the following:       Result Value   Trich, Wet Prep PRESENT (*)    Clue Cells Wet Prep HPF POC PRESENT (*)    WBC, Wet Prep HPF POC MODERATE (*)    All other components within normal limits  BASIC METABOLIC PANEL - Abnormal; Notable for the following:    Glucose, Bld 101 (*)    All other components within normal limits  CBC WITH DIFFERENTIAL/PLATELET - Abnormal; Notable for the following:    Hemoglobin 10.9 (*)    HCT 33.1 (*)    All other components within normal limits  URINALYSIS, ROUTINE W REFLEX MICROSCOPIC (NOT AT [facility]) - Abnormal; Notable for the following:    APPearance CLOUDY (*)    Hgb urine dipstick MODERATE (*)    Nitrite POSITIVE (*)    Leukocytes, UA SMALL (*)    All other components within normal limits  URINE MICROSCOPIC-ADD ON - Abnormal; Notable for the following:    Squamous Epithelial / LPF 6-30 (*)    Bacteria, UA FEW (*)    All  other components within normal limits  I-STAT BETA HCG BLOOD, ED ([facility], [facility], [facility] ONLY)  GC/CHLAMYDIA PROBE AMP ([facility]) NOT AT [facility]    EKG  EKG Interpretation None       Radiology  No results found.  Procedures Procedures (including critical care time)  Medications Ordered in ED Medications  sodium chloride 0.9 % bolus 1,000 mL (0 mLs Intravenous Stopped 06/01/16 1323)  ketorolac (TORADOL) 30 MG/ML injection 30 mg (30 mg Intravenous Given 06/01/16 1323)  metroNIDAZOLE (FLAGYL) tablet 2,000 mg (2,000 mg Oral Given 06/01/16 1323)     Initial Impression / Assessment and Plan / ED Course  I have reviewed the triage vital signs and the nursing notes.  Pertinent labs & imaging results that were available during my care of the patient were reviewed by me and considered in my medical decision making (see chart for details).  Clinical Course    Pt looks comfortable.  She is not anemic.  I will treat her with flagyl, ibuprofen, and provera.  She is given the number to women's outpatient clinic and have her f/u there.  She knows to return if worse.   Final Clinical Impressions(s) / ED Diagnoses   Final diagnoses:  BV (bacterial vaginosis)  Trichomoniasis of vagina  Menometrorrhagia  History of uterine fibroid    New Prescriptions New Prescriptions   IBUPROFEN (ADVIL,MOTRIN) 600 MG TABLET    Take 1 tablet (600 mg total) by mouth every 6 (six) hours as needed.   MEDROXYPROGESTERONE (PROVERA) 10 MG TABLET    Take 1 tablet (10 mg total) by mouth daily.   METRONIDAZOLE (FLAGYL) 500 MG TABLET    Take 1 tablet (500 mg total) by mouth 2 (two) times daily.     Isla Pence, MD 06/01/16 [phone]

## 2016-06-03 LAB — GC/CHLAMYDIA PROBE AMP (~~LOC~~) NOT AT ARMC
Chlamydia: NEGATIVE
Neisseria Gonorrhea: NEGATIVE

## 2016-07-10 ENCOUNTER — Emergency Department (HOSPITAL_COMMUNITY): Payer: 59

## 2016-07-10 ENCOUNTER — Encounter (HOSPITAL_COMMUNITY): Payer: Self-pay | Admitting: Emergency Medicine

## 2016-07-10 ENCOUNTER — Emergency Department (HOSPITAL_COMMUNITY)
Admission: EM | Admit: 2016-07-10 | Discharge: 2016-07-10 | Disposition: A | Discharge status: Home / Self Care | Payer: 59 | Attending: Emergency Medicine | Admitting: Emergency Medicine

## 2016-07-10 DIAGNOSIS — R0789 Other chest pain: Secondary | ICD-10-CM | POA: Diagnosis not present

## 2016-07-10 DIAGNOSIS — R079 Chest pain, unspecified: Secondary | ICD-10-CM | POA: Diagnosis present

## 2016-07-10 LAB — CBC
HEMATOCRIT: 32.9 % — AB (ref 36.0–46.0)
Hemoglobin: 10.8 g/dL — ABNORMAL LOW (ref 12.0–15.0)
MCH: 27.6 pg (ref 26.0–34.0)
MCHC: 32.8 g/dL (ref 30.0–36.0)
MCV: 83.9 fL (ref 78.0–100.0)
Platelets: 382 10*3/uL (ref 150–400)
RBC: 3.92 MIL/uL (ref 3.87–5.11)
RDW: 13.8 % (ref 11.5–15.5)
WBC: 6.8 10*3/uL (ref 4.0–10.5)

## 2016-07-10 LAB — BASIC METABOLIC PANEL
Anion gap: 5 (ref 5–15)
BUN: 13 mg/dL (ref 6–20)
CHLORIDE: 106 mmol/L (ref 101–111)
CO2: 25 mmol/L (ref 22–32)
Calcium: 9.1 mg/dL (ref 8.9–10.3)
Creatinine, Ser: 0.57 mg/dL (ref 0.44–1.00)
GFR calc Af Amer: >60 mL/min (ref >60)
GFR calc non Af Amer: >60 mL/min (ref >60)
GLUCOSE: 85 mg/dL (ref 65–99)
POTASSIUM: 3.5 mmol/L (ref 3.5–5.1)
SODIUM: 136 mmol/L (ref 135–145)

## 2016-07-10 LAB — I-STAT TROPONIN, ED: Troponin i, poc: 0 ng/mL (ref 0.00–0.08)

## 2016-07-10 MED ORDER — METHOCARBAMOL 750 MG PO TABS: 750.0000 mg | ORAL_TABLET | Freq: Four times a day (QID) | ORAL | 0 refills | Status: DC

## 2016-07-10 MED ORDER — NAPROXEN 500 MG PO TABS: 500.0000 mg | ORAL_TABLET | Freq: Two times a day (BID) | ORAL | 0 refills | Status: DC

## 2016-07-10 NOTE — ED Provider Notes (Signed)
[facility] DEPT Provider Note   CSN: [id number] Arrival date & time: 07/10/16  1540     History   Chief Complaint Chief Complaint  Patient presents with  . Chest Pain    HPI Sonia Allen is a 31 y.o. female.  31 year old female presents with several month history of sharp chest pain which is localized to her xiphoid and costal margins. Patient works with manual labor and states that she's had this often worse after work and worse with movement. She has used Motrin with temporary relief. Denies any associated dyspnea or diaphoresis. No leg pain or swelling. No rashes noted. No palpitations or near syncope. No cough or congestion. Present now due to concern for blood has a lateral when she says that she is had for over 5 months. Denies any recent weight loss or night sweats.      Past Medical History:  Diagnosis Date  . Anemia    first and sec pregnancies  . Arrhythmia   . Chlamydia   . Fibroid   . Gestational diabetes    diet controlled  . Gonorrhea   . Headache(784.0)   . Hemorrhoids   . Hx of migraine headaches   . Leiomyoma of uterus in pregnancy     Patient Active Problem List   Diagnosis Date Noted  . Pregnancy 03/12/2014  . Marginal insertion of umbilical cord [id number]  . NSVD (normal spontaneous vaginal delivery) 03/12/2014  . Gestational diabetes mellitus, antepartum 01/17/2014  . Uterine fibroids affecting pregnancy 10/14/2012  . Anemia 10/14/2012  . HEADACHE 02/18/2007    Past Surgical History:  Procedure Laterality Date  . INDUCED ABORTION  March  2 ,2013  . WISDOM TOOTH EXTRACTION      OB History    Gravida Para Term Preterm AB Living   6 4 4  0 1 4   SAB TAB Ectopic Multiple Live Births   0 1 0 0 4       Home Medications    Prior to Admission medications   Medication Sig Start Date End Date Taking? Authorizing Provider  cyclobenzaprine (FLEXERIL) 10 MG tablet Take 1 tablet (10 mg total) by mouth 3 (three) times daily as needed for  muscle spasms. 02/22/16   Duffy Bruce, MD  ibuprofen (ADVIL,MOTRIN) 600 MG tablet Take 1 tablet (600 mg total) by mouth every 6 (six) hours as needed. 06/01/16   Isla Pence, MD  medroxyPROGESTERone (PROVERA) 10 MG tablet Take 1 tablet (10 mg total) by mouth daily. 06/01/16   Isla Pence, MD  metroNIDAZOLE (FLAGYL) 500 MG tablet Take 1 tablet (500 mg total) by mouth 2 (two) times daily. 06/01/16   Isla Pence, MD  nitrofurantoin, macrocrystal-monohydrate, (MACROBID) 100 MG capsule Take 1 capsule by mouth 2 (two) times daily. 02/14/16   Historical Provider, MD  omeprazole (PRILOSEC) 20 MG capsule Take 1 capsule (20 mg total) by mouth daily. Take in the morning 30 minutes before eating. 02/22/16   Duffy Bruce, MD  promethazine (PHENERGAN) 25 MG tablet Take 0.5-1 tablets (12.5-25 mg total) by mouth every 6 (six) hours as needed. Patient not taking: Reported on 02/22/2016 10/05/15   Tresea Mall, CNM    Family History Family History  Problem Relation Age of Onset  . Other Neg Hx   . Alcohol abuse Neg Hx   . Arthritis Neg Hx   . Asthma Neg Hx   . Birth defects Neg Hx   . Cancer Neg Hx   . COPD Neg Hx   .  Depression Neg Hx   . Diabetes Neg Hx   . Drug abuse Neg Hx   . Early death Neg Hx   . Heart disease Neg Hx   . Hearing loss Neg Hx   . Hyperlipidemia Neg Hx   . Hypertension Neg Hx   . Kidney disease Neg Hx   . Learning disabilities Neg Hx   . Mental illness Neg Hx   . Mental retardation Neg Hx   . Miscarriages / Stillbirths Neg Hx   . Stroke Neg Hx   . Vision loss Neg Hx     Social History Social History  Substance Use Topics  . Smoking status: Never Smoker  . Smokeless tobacco: Never Used  . Alcohol use Yes     Comment: Every other weekend.      Allergies   Patient has no known allergies.   Review of Systems Review of Systems  All other systems reviewed and are negative.    Physical Exam Updated Vital Signs BP 125/71 (BP Location: Right Arm)   Pulse  73   Temp 98.2 F (36.8 C) (Oral)   Resp 18   Ht 5\' 3"  (1.6 m)   LMP 09/01/2015   SpO2 100%   Physical Exam  Constitutional: She is oriented to person, place, and time. She appears well-developed and well-nourished.  Non-toxic appearance. No distress.  HENT:  Head: Normocephalic and atraumatic.  Eyes: Conjunctivae, EOM and lids are normal. Pupils are equal, round, and reactive to light.  Neck: Normal range of motion. Neck supple. No tracheal deviation present. No thyroid mass present.  Cardiovascular: Normal rate, regular rhythm and normal heart sounds.  Exam reveals no gallop.   No murmur heard. Pulmonary/Chest: Effort normal and breath sounds normal. No stridor. No respiratory distress. She has no decreased breath sounds. She has no wheezes. She has no rhonchi. She has no rales.    Abdominal: Soft. Normal appearance and bowel sounds are normal. She exhibits no distension. There is no tenderness. There is no rebound and no CVA tenderness.  Musculoskeletal: Normal range of motion. She exhibits no edema or tenderness.  Neurological: She is alert and oriented to person, place, and time. She has normal strength. No cranial nerve deficit or sensory deficit. GCS eye subscore is 4. GCS verbal subscore is 5. GCS motor subscore is 6.  Skin: Skin is warm and dry. No abrasion and no rash noted.  Psychiatric: She has a normal mood and affect. Her speech is normal and behavior is normal.  Nursing note and vitals reviewed.    ED Treatments / Results  Labs (all labs ordered are listed, but only abnormal results are displayed) Labs Reviewed  CBC - Abnormal; Notable for the following:       Result Value   Hemoglobin 10.8 (*)    HCT 32.9 (*)    All other components within normal limits  BASIC METABOLIC PANEL  I-STAT TROPOININ, ED    EKG  EKG Interpretation  Date/Time:  Wednesday July 10 2016 15:56:12 EST Ventricular Rate:  85 PR Interval:    QRS Duration: 77 QT Interval:  352 QTC  Calculation: 419 R Axis:   30 Text Interpretation:  Sinus rhythm Borderline T wave abnormalities No significant change since last tracing Confirmed by Zenia Resides  MD, Nanci Lakatos ([id number]) on 07/10/2016 6:38:39 PM       Radiology Dg Chest 2 View  Result Date: 07/10/2016 CLINICAL DATA:  Intermittent sharp chest pain, some nausea over the last 2-3 months EXAM:  CHEST  2 VIEW COMPARISON:  Chest x-ray of 02/22/2016 FINDINGS: No active infiltrate or effusion is seen. Mediastinal and hilar contours are unremarkable. The heart is within upper limits of normal for age. No bony abnormality is seen. IMPRESSION: No active cardiopulmonary disease. Electronically Signed   By: Ivar Drape M.D.   On: 07/10/2016 16:41    Procedures Procedures (including critical care time)  Medications Ordered in ED Medications - No data to display   Initial Impression / Assessment and Plan / ED Course  I have reviewed the triage vital signs and the nursing notes.  Pertinent labs & imaging results that were available during my care of the patient were reviewed by me and considered in my medical decision making (see chart for details).  Clinical Course   Agent here with reproducible chest wall tenderness. She says symptoms for several months. I do not think that this represents ACS or PE. No rashes noted. Will treat with NSAIDs and she will follow with her Dr.  Final Clinical Impressions(s) / ED Diagnoses   Final diagnoses:  None    New Prescriptions New Prescriptions   No medications on file     Lacretia Leigh, MD 07/10/16 1857

## 2016-07-10 NOTE — ED Triage Notes (Signed)
Patient reports intermittent central sharp chest pain with nausea x "some months." Denies pain radiation, V/D, and SOB. Patient also reports "blood in saliva" every morning. Denies cough and night sweats.

## 2016-08-20 ENCOUNTER — Inpatient Hospital Stay (HOSPITAL_COMMUNITY)
Admission: AD | Admit: 2016-08-20 | Discharge: 2016-08-20 | Disposition: A | Discharge status: Home / Self Care | Payer: 59 | Source: Ambulatory Visit | Attending: Family Medicine | Admitting: Family Medicine

## 2016-08-20 ENCOUNTER — Encounter (HOSPITAL_COMMUNITY): Payer: Self-pay | Admitting: *Deleted

## 2016-08-20 ENCOUNTER — Inpatient Hospital Stay (HOSPITAL_COMMUNITY): Payer: 59

## 2016-08-20 DIAGNOSIS — Z3A01 Less than 8 weeks gestation of pregnancy: Secondary | ICD-10-CM | POA: Insufficient documentation

## 2016-08-20 DIAGNOSIS — O208 Other hemorrhage in early pregnancy: Secondary | ICD-10-CM | POA: Diagnosis not present

## 2016-08-20 DIAGNOSIS — O3680X0 Pregnancy with inconclusive fetal viability, not applicable or unspecified: Secondary | ICD-10-CM | POA: Diagnosis not present

## 2016-08-20 DIAGNOSIS — O209 Hemorrhage in early pregnancy, unspecified: Secondary | ICD-10-CM | POA: Insufficient documentation

## 2016-08-20 DIAGNOSIS — O26851 Spotting complicating pregnancy, first trimester: Secondary | ICD-10-CM | POA: Diagnosis present

## 2016-08-20 DIAGNOSIS — R102 Pelvic and perineal pain: Secondary | ICD-10-CM

## 2016-08-20 LAB — URINALYSIS, ROUTINE W REFLEX MICROSCOPIC
BILIRUBIN URINE: NEGATIVE
GLUCOSE, UA: NEGATIVE mg/dL
KETONES UR: NEGATIVE mg/dL
LEUKOCYTES UA: NEGATIVE
Nitrite: NEGATIVE
PH: 5 (ref 5.0–8.0)
PROTEIN: NEGATIVE mg/dL
Specific Gravity, Urine: 1.024 (ref 1.005–1.030)

## 2016-08-20 LAB — CBC
HCT: 31.8 % — ABNORMAL LOW (ref 36.0–46.0)
Hemoglobin: 10.5 g/dL — ABNORMAL LOW (ref 12.0–15.0)
MCH: 28.2 pg (ref 26.0–34.0)
MCHC: 33 g/dL (ref 30.0–36.0)
MCV: 85.3 fL (ref 78.0–100.0)
PLATELETS: 340 10*3/uL (ref 150–400)
RBC: 3.73 MIL/uL — AB (ref 3.87–5.11)
RDW: 14.1 % (ref 11.5–15.5)
WBC: 5.9 10*3/uL (ref 4.0–10.5)

## 2016-08-20 LAB — WET PREP, GENITAL
Sperm: NONE SEEN
Trich, Wet Prep: NONE SEEN
Yeast Wet Prep HPF POC: NONE SEEN

## 2016-08-20 LAB — POCT PREGNANCY, URINE: Preg Test, Ur: POSITIVE — AB

## 2016-08-20 LAB — HCG, QUANTITATIVE, PREGNANCY: HCG, BETA CHAIN, QUANT, S: 240 m[IU]/mL — AB (ref <5)

## 2016-08-20 NOTE — MAU Note (Signed)
The other day, the bottom of her stomach was really hurting and she started bleeding.  Bleeding has stopped, but the pain continues. +HPT on Sat.  Yesterday, started having pain in her feet and legs, felt like they were swollen

## 2016-08-20 NOTE — Discharge Instructions (Signed)
Vaginal Bleeding During Pregnancy, First Trimester °A small amount of bleeding (spotting) from the vagina is common in early pregnancy. Sometimes the bleeding is normal and is not a problem, and sometimes it is a sign of something serious. Be sure to tell your doctor about any bleeding from your vagina right away. °Follow these instructions at home: °· Watch your condition for any changes. °· Follow your doctor's instructions about how active you can be. °· If you are on bed rest: °¨ You may need to stay in bed and only get up to use the bathroom. °¨ You may be allowed to do some activities. °¨ If you need help, make plans for someone to help you. °· Write down: °¨ The number of pads you use each day. °¨ How often you change pads. °¨ How soaked (saturated) your pads are. °· Do not use tampons. °· Do not douche. °· Do not have sex or orgasms until your doctor says it is okay. °· If you pass any tissue from your vagina, save the tissue so you can show it to your doctor. °· Only take medicines as told by your doctor. °· Do not take aspirin because it can make you bleed. °· Keep all follow-up visits as told by your doctor. °Contact a doctor if: °· You bleed from your vagina. °· You have cramps. °· You have labor pains. °· You have a fever that does not go away after you take medicine. °Get help right away if: °· You have very bad cramps in your back or belly (abdomen). °· You pass large clots or tissue from your vagina. °· You bleed more. °· You feel light-headed or weak. °· You pass out (faint). °· You have chills. °· You are leaking fluid or have a gush of fluid from your vagina. °· You pass out while pooping (having a bowel movement). °This information is not intended to replace advice given to you by your health care provider. Make sure you discuss any questions you have with your health care provider. °Document Released: 11/01/2013 Document Revised: 11/23/2015 Document Reviewed: 02/22/2013 °Elsevier Interactive  Patient Education © 2017 [employer] ° °

## 2016-08-20 NOTE — MAU Provider Note (Signed)
Chief Complaint: Abdominal Pain   First Provider Initiated Contact with Patient 08/20/16 1205        SUBJECTIVE HPI: Sonia Allen is a 31 y.o. [id number] at [redacted]w[redacted]d by LMP who presents to maternity admissions reporting light bleeding, pink today, was more red yesterday. Was never as much as a period.  Some pelvic cramping also at times  .She denies vaginal itching/burning, urinary symptoms, h/a, dizziness, n/v, or fever/chills.    Abdominal Pain  This is a new problem. The current episode started in the past 7 days. The onset quality is gradual. The problem occurs intermittently. The problem has been unchanged. The pain is located in the LLQ, RLQ and suprapubic region. The pain is mild. The quality of the pain is aching and cramping. The abdominal pain does not radiate. Pertinent negatives include no constipation, diarrhea, dysuria, fever, frequency, headaches, myalgias, nausea or vomiting. Nothing aggravates the pain. The pain is relieved by nothing. She has tried nothing for the symptoms.  Vaginal Bleeding  The patient's primary symptoms include pelvic pain and vaginal bleeding. The patient's pertinent negatives include no genital itching, genital lesions, genital odor or genital rash. This is a new problem. The current episode started in the past 7 days. The problem occurs intermittently. The problem has been gradually improving. The pain is mild. The problem affects both sides. She is pregnant. Associated symptoms include abdominal pain. Pertinent negatives include no constipation, diarrhea, dysuria, fever, frequency, headaches, nausea or vomiting. The vaginal discharge was bloody and scant. She has not been passing clots. She has not been passing tissue. Nothing aggravates the symptoms. It is unknown whether or not her partner has an STD.   RN Note: The other day, the bottom of her stomach was really hurting and she started bleeding.  Bleeding has stopped, but the pain continues. +HPT on Sat.   Yesterday, started having pain in her feet and legs, felt like they were swollen  Past Medical History:  Diagnosis Date  . Anemia    first and sec pregnancies  . Arrhythmia   . Chlamydia   . Fibroid   . Gestational diabetes    diet controlled  . Gonorrhea   . Headache(784.0)   . Hemorrhoids   . Hx of migraine headaches   . Leiomyoma of uterus in pregnancy    Past Surgical History:  Procedure Laterality Date  . INDUCED ABORTION  March  2 ,2013  . WISDOM TOOTH EXTRACTION     Social History   Social History  . Marital status: Single    Spouse name: N/A  . Number of children: N/A  . Years of education: N/A   Occupational History  . Not on file.   Social History Main Topics  . Smoking status: Never Smoker  . Smokeless tobacco: Never Used  . Alcohol use Yes     Comment: Every other weekend.   . Drug use: No  . Sexual activity: Yes    Birth control/ protection: None   Other Topics Concern  . Not on file   Social History Narrative  . No narrative on file   No current facility-administered medications on file prior to encounter.    Current Outpatient Prescriptions on File Prior to Encounter  Medication Sig Dispense Refill  . cyclobenzaprine (FLEXERIL) 10 MG tablet Take 1 tablet (10 mg total) by mouth 3 (three) times daily as needed for muscle spasms. 20 tablet 0  . ibuprofen (ADVIL,MOTRIN) 600 MG tablet Take 1 tablet (600 mg total) by  mouth every 6 (six) hours as needed. 30 tablet 0  . medroxyPROGESTERone (PROVERA) 10 MG tablet Take 1 tablet (10 mg total) by mouth daily. 7 tablet 0  . methocarbamol (ROBAXIN-750) 750 MG tablet Take 1 tablet (750 mg total) by mouth 4 (four) times daily. 30 tablet 0  . metroNIDAZOLE (FLAGYL) 500 MG tablet Take 1 tablet (500 mg total) by mouth 2 (two) times daily. 14 tablet 0  . naproxen (NAPROSYN) 500 MG tablet Take 1 tablet (500 mg total) by mouth 2 (two) times daily. 30 tablet 0  . nitrofurantoin, macrocrystal-monohydrate, (MACROBID)  100 MG capsule Take 1 capsule by mouth 2 (two) times daily.  0  . omeprazole (PRILOSEC) 20 MG capsule Take 1 capsule (20 mg total) by mouth daily. Take in the morning 30 minutes before eating. 14 capsule 0  . promethazine (PHENERGAN) 25 MG tablet Take 0.5-1 tablets (12.5-25 mg total) by mouth every 6 (six) hours as needed. (Patient not taking: Reported on 02/22/2016) 30 tablet 0   No Known Allergies  I have reviewed patient's Past Medical Hx, Surgical Hx, Family Hx, Social Hx, medications and allergies.   ROS:  Review of Systems  Constitutional: Negative for fever.  Gastrointestinal: Positive for abdominal pain. Negative for constipation, diarrhea, nausea and vomiting.  Genitourinary: Positive for pelvic pain and vaginal bleeding. Negative for dysuria and frequency.  Musculoskeletal: Negative for myalgias.  Neurological: Negative for headaches.   Review of Systems  Other systems negative   Physical Exam  Physical Exam Patient Vitals for the past 24 hrs:  BP Temp Temp src Pulse Resp Height Weight  08/20/16 1157 113/58 98.6 F (37 C) Oral 79 16 5\' 4"  (1.626 m) 183 lb 12.8 oz (83.4 kg)   Constitutional: Well-developed, well-nourished female in no acute distress.  Cardiovascular: normal rate Respiratory: normal effort GI: Abd soft, non-tender. Pos BS x 4 MS: Extremities nontender, no edema, normal ROM Neurologic: Alert and oriented x 4.  GU: Neg CVAT.  PELVIC EXAM: Cervix pink, visually closed, without lesion, small light red discharge, vaginal walls and external genitalia normal Bimanual exam: Cervix 0/long/high, firm, anterior, neg CMT, uterus mildly tender, nonenlarged, adnexa without tenderness, enlargement, or mass    LAB RESULTS Blood Type B+    Results for orders placed or performed during the hospital encounter of 08/20/16 (from the past 24 hour(s))  Urinalysis, Routine w reflex microscopic     Status: Abnormal   Collection Time: 08/20/16 12:06 PM  Result Value Ref  Range   Color, Urine YELLOW YELLOW   APPearance CLEAR CLEAR   Specific Gravity, Urine 1.024 1.005 - 1.030   pH 5.0 5.0 - 8.0   Glucose, UA NEGATIVE NEGATIVE mg/dL   Hgb urine dipstick MODERATE (A) NEGATIVE   Bilirubin Urine NEGATIVE NEGATIVE   Ketones, ur NEGATIVE NEGATIVE mg/dL   Protein, ur NEGATIVE NEGATIVE mg/dL   Nitrite NEGATIVE NEGATIVE   Leukocytes, UA NEGATIVE NEGATIVE   RBC / HPF 0-5 0 - 5 RBC/hpf   WBC, UA 0-5 0 - 5 WBC/hpf   Bacteria, UA RARE (A) NONE SEEN   Squamous Epithelial / LPF 0-5 (A) NONE SEEN   Mucous PRESENT   Pregnancy, urine POC     Status: Abnormal   Collection Time: 08/20/16 12:13 PM  Result Value Ref Range   Preg Test, Ur POSITIVE (A) NEGATIVE  CBC     Status: Abnormal   Collection Time: 08/20/16 12:36 PM  Result Value Ref Range   WBC 5.9 4.0 - 10.5  K/uL   RBC 3.73 (L) 3.87 - 5.11 MIL/uL   Hemoglobin 10.5 (L) 12.0 - 15.0 g/dL   HCT 31.8 (L) 36.0 - 46.0 %   MCV 85.3 78.0 - 100.0 fL   MCH 28.2 26.0 - 34.0 pg   MCHC 33.0 30.0 - 36.0 g/dL   RDW 14.1 11.5 - 15.5 %   Platelets 340 150 - 400 K/uL  hCG, quantitative, pregnancy     Status: Abnormal   Collection Time: 08/20/16 12:36 PM  Result Value Ref Range   hCG, Beta Chain, Quant, S 240 (H) <5 mIU/mL  Wet prep, genital     Status: Abnormal   Collection Time: 08/20/16 12:57 PM  Result Value Ref Range   Yeast Wet Prep HPF POC NONE SEEN NONE SEEN   Trich, Wet Prep NONE SEEN NONE SEEN   Clue Cells Wet Prep HPF POC PRESENT (A) NONE SEEN   WBC, Wet Prep HPF POC MODERATE (A) NONE SEEN   Sperm NONE SEEN      IMAGING No results found.  MAU Management/MDM: Ordered usual first trimester r/o ectopic labs.   Pelvic exam and cultures done Will check baseline Ultrasound to rule out ectopic.  This bleeding/pain can represent a normal pregnancy with bleeding, spontaneous abortion or even an ectopic which can be life-threatening.  The process as listed above helps to determine which of these is  present.  Quant HCG was low. Discussed this is not conclusive.  Will recommend repeat HCG on Thursday, then possibly a second repeat test followed by a repeat US in 7-10 days.  ASSESSMENT Pregnancy at [redacted]w[redacted]d by LMP Bleeding in first trimester Pregnancy of unknown location  PLAN Discharge home Plan to repeat HCG level in 48 hours in clinic per 11:00 am schedule Will repeat  Ultrasound in about 7-10 days if HCG levels double appropriately  Ectopic precautions   Pt stable at time of discharge. Encouraged to return here or to other Urgent Care/ED if she develops worsening of symptoms, increase in pain, fever, or other concerning symptoms.    Hansel Feinstein CNM, MSN Certified Nurse-Midwife 08/20/2016  12:05 PM

## 2016-08-21 LAB — GC/CHLAMYDIA PROBE AMP (~~LOC~~) NOT AT ARMC
CHLAMYDIA, DNA PROBE: NEGATIVE
NEISSERIA GONORRHEA: NEGATIVE

## 2016-08-21 LAB — HIV ANTIBODY (ROUTINE TESTING W REFLEX): HIV Screen 4th Generation wRfx: NONREACTIVE

## 2016-08-22 ENCOUNTER — Ambulatory Visit: Payer: 59

## 2016-08-22 ENCOUNTER — Telehealth: Payer: Self-pay

## 2016-08-22 NOTE — Telephone Encounter (Signed)
Attempted to reach patient but there was no answer or voicemail to leave message. Patient missed her BHCG STAT this am.

## 2016-09-20 ENCOUNTER — Inpatient Hospital Stay (HOSPITAL_COMMUNITY)
Admission: AD | Admit: 2016-09-20 | Discharge: 2016-09-20 | Disposition: A | Discharge status: Home / Self Care | Payer: 59 | Source: Ambulatory Visit | Attending: Obstetrics & Gynecology | Admitting: Obstetrics & Gynecology

## 2016-09-20 ENCOUNTER — Encounter (HOSPITAL_COMMUNITY): Payer: Self-pay | Admitting: *Deleted

## 2016-09-20 ENCOUNTER — Inpatient Hospital Stay (HOSPITAL_COMMUNITY): Payer: 59

## 2016-09-20 DIAGNOSIS — M549 Dorsalgia, unspecified: Secondary | ICD-10-CM | POA: Diagnosis not present

## 2016-09-20 DIAGNOSIS — Z6790 Unspecified blood type, Rh positive: Secondary | ICD-10-CM

## 2016-09-20 DIAGNOSIS — O26859 Spotting complicating pregnancy, unspecified trimester: Secondary | ICD-10-CM

## 2016-09-20 DIAGNOSIS — R109 Unspecified abdominal pain: Secondary | ICD-10-CM

## 2016-09-20 DIAGNOSIS — O99511 Diseases of the respiratory system complicating pregnancy, first trimester: Secondary | ICD-10-CM | POA: Diagnosis not present

## 2016-09-20 DIAGNOSIS — O209 Hemorrhage in early pregnancy, unspecified: Secondary | ICD-10-CM

## 2016-09-20 DIAGNOSIS — O9989 Other specified diseases and conditions complicating pregnancy, childbirth and the puerperium: Secondary | ICD-10-CM

## 2016-09-20 DIAGNOSIS — R51 Headache: Secondary | ICD-10-CM | POA: Diagnosis not present

## 2016-09-20 DIAGNOSIS — O26891 Other specified pregnancy related conditions, first trimester: Secondary | ICD-10-CM | POA: Diagnosis not present

## 2016-09-20 DIAGNOSIS — Z3A08 8 weeks gestation of pregnancy: Secondary | ICD-10-CM | POA: Insufficient documentation

## 2016-09-20 DIAGNOSIS — R519 Headache, unspecified: Secondary | ICD-10-CM

## 2016-09-20 DIAGNOSIS — O26899 Other specified pregnancy related conditions, unspecified trimester: Secondary | ICD-10-CM

## 2016-09-20 DIAGNOSIS — O26851 Spotting complicating pregnancy, first trimester: Secondary | ICD-10-CM | POA: Insufficient documentation

## 2016-09-20 DIAGNOSIS — R197 Diarrhea, unspecified: Secondary | ICD-10-CM | POA: Diagnosis present

## 2016-09-20 DIAGNOSIS — J3089 Other allergic rhinitis: Secondary | ICD-10-CM

## 2016-09-20 DIAGNOSIS — G44229 Chronic tension-type headache, not intractable: Secondary | ICD-10-CM

## 2016-09-20 DIAGNOSIS — D259 Leiomyoma of uterus, unspecified: Secondary | ICD-10-CM | POA: Diagnosis not present

## 2016-09-20 DIAGNOSIS — O3411 Maternal care for benign tumor of corpus uteri, first trimester: Secondary | ICD-10-CM | POA: Diagnosis not present

## 2016-09-20 DIAGNOSIS — O99891 Other specified diseases and conditions complicating pregnancy: Secondary | ICD-10-CM

## 2016-09-20 LAB — URINALYSIS, ROUTINE W REFLEX MICROSCOPIC
Bacteria, UA: NONE SEEN
Bilirubin Urine: NEGATIVE
GLUCOSE, UA: NEGATIVE mg/dL
Ketones, ur: NEGATIVE mg/dL
Leukocytes, UA: NEGATIVE
NITRITE: NEGATIVE
PH: 5 (ref 5.0–8.0)
Protein, ur: NEGATIVE mg/dL
SPECIFIC GRAVITY, URINE: 1.026 (ref 1.005–1.030)

## 2016-09-20 LAB — CBC
HEMATOCRIT: 32.9 % — AB (ref 36.0–46.0)
HEMOGLOBIN: 10.9 g/dL — AB (ref 12.0–15.0)
MCH: 28.6 pg (ref 26.0–34.0)
MCHC: 33.1 g/dL (ref 30.0–36.0)
MCV: 86.4 fL (ref 78.0–100.0)
Platelets: 318 10*3/uL (ref 150–400)
RBC: 3.81 MIL/uL — ABNORMAL LOW (ref 3.87–5.11)
RDW: 13.5 % (ref 11.5–15.5)
WBC: 5.7 10*3/uL (ref 4.0–10.5)

## 2016-09-20 LAB — WET PREP, GENITAL
SPERM: NONE SEEN
TRICH WET PREP: NONE SEEN
Yeast Wet Prep HPF POC: NONE SEEN

## 2016-09-20 LAB — HCG, QUANTITATIVE, PREGNANCY: hCG, Beta Chain, Quant, S: 71416 m[IU]/mL — ABNORMAL HIGH (ref <5)

## 2016-09-20 MED ORDER — ACETAMINOPHEN 500 MG PO TABS
1000.0000 mg | ORAL_TABLET | Freq: Four times a day (QID) | ORAL | Status: DC | PRN
  Administered 2016-09-20: 1000 mg via ORAL
  Filled 2016-09-20: qty 2

## 2016-09-20 NOTE — Discharge Instructions (Signed)
Vaginal Bleeding During Pregnancy, First Trimester °A small amount of bleeding (spotting) from the vagina is common in early pregnancy. Sometimes the bleeding is normal and is not a problem, and sometimes it is a sign of something serious. Be sure to tell your doctor about any bleeding from your vagina right away. °Follow these instructions at home: °· Watch your condition for any changes. °· Follow your doctor's instructions about how active you can be. °· If you are on bed rest: °¨ You may need to stay in bed and only get up to use the bathroom. °¨ You may be allowed to do some activities. °¨ If you need help, make plans for someone to help you. °· Write down: °¨ The number of pads you use each day. °¨ How often you change pads. °¨ How soaked (saturated) your pads are. °· Do not use tampons. °· Do not douche. °· Do not have sex or orgasms until your doctor says it is okay. °· If you pass any tissue from your vagina, save the tissue so you can show it to your doctor. °· Only take medicines as told by your doctor. °· Do not take aspirin because it can make you bleed. °· Keep all follow-up visits as told by your doctor. °Contact a doctor if: °· You bleed from your vagina. °· You have cramps. °· You have labor pains. °· You have a fever that does not go away after you take medicine. °Get help right away if: °· You have very bad cramps in your back or belly (abdomen). °· You pass large clots or tissue from your vagina. °· You bleed more. °· You feel light-headed or weak. °· You pass out (faint). °· You have chills. °· You are leaking fluid or have a gush of fluid from your vagina. °· You pass out while pooping (having a bowel movement). °This information is not intended to replace advice given to you by your health care provider. Make sure you discuss any questions you have with your health care provider. °Document Released: 11/01/2013 Document Revised: 11/23/2015 Document Reviewed: 02/22/2013 °Elsevier Interactive  Patient Education © 2017 [employer] ° °

## 2016-09-20 NOTE — MAU Provider Note (Signed)
History     CSN: [id number]  Arrival date and time: 09/20/16 1316   First Provider Initiated Contact with Patient 09/20/16 1348      Chief Complaint  Patient presents with  . Diarrhea  . Back Pain  . Headache  . swollen hand   Sonia Allen is a 31 y.o [phone] female at [redacted]w[redacted]d who presents with headache, diarrhea, back pain, and swollen spot on hand.  Her primary concern is her headache.  Headache is intermittent, bilateral on temples.  Dull, achy, throbbing at times.  Relieved by rubbing Vicks on forehead.  Has not tried taking any medications for pain.  No aura, vision changes.  Occurs at any time of day.  Says she has many stressors at work.  Of note, patient missed repeat beta-hCG appointment for pregnancy of unknown location d/t work conflict on 6/65/99; repeat transvaginal U/S was recommended two weeks from 08/20/16 U/S and was also not completed.     Secondary concerns of patient: back pain is dull, lower back, occurs after lifting heavy boxes by bending at hips.  Diarrhea is intermittent, began roughly 2 days ago, sometimes watery and sometimes loose stools.  Always after eating, "I feel like I can't hold it in and have to go."  Swollen spot on hand appeared this morning after waking up from sleep, slightly itchy, not red, small.  Patient admits it could be irritation from a mosquito bite.  Patient also mentions spitting up bright red blood every morning for a year before brushing or flossing teeth.  States she has seasonal allergies, nasal congestion, no humidifier.     Diarrhea   Associated symptoms include headaches. Pertinent negatives include no abdominal pain, fever or vomiting.  Back Pain  Associated symptoms include headaches. Pertinent negatives include no abdominal pain, chest pain, dysuria or fever.  Headache   Associated symptoms include back pain and rhinorrhea. Pertinent negatives include no abdominal pain, fever, sinus pressure or vomiting.    OB History     Gravida Para Term Preterm AB Living   7 4 4  0 2 4   SAB TAB Ectopic Multiple Live Births   1 1 0 0 4      Past Medical History:  Diagnosis Date  . Anemia    first and sec pregnancies  . Arrhythmia   . Chlamydia   . Fibroid   . Gestational diabetes    diet controlled  . Gonorrhea   . Headache(784.0)   . Hemorrhoids   . Hx of migraine headaches   . Leiomyoma of uterus in pregnancy     Past Surgical History:  Procedure Laterality Date  . INDUCED ABORTION  March  2 ,2013  . WISDOM TOOTH EXTRACTION      Family History  Problem Relation Age of Onset  . Other Neg Hx   . Alcohol abuse Neg Hx   . Arthritis Neg Hx   . Asthma Neg Hx   . Birth defects Neg Hx   . Cancer Neg Hx   . COPD Neg Hx   . Depression Neg Hx   . Diabetes Neg Hx   . Drug abuse Neg Hx   . Early death Neg Hx   . Heart disease Neg Hx   . Hearing loss Neg Hx   . Hyperlipidemia Neg Hx   . Hypertension Neg Hx   . Kidney disease Neg Hx   . Learning disabilities Neg Hx   . Mental illness Neg Hx   . Mental retardation  Neg Hx   . Miscarriages / Stillbirths Neg Hx   . Stroke Neg Hx   . Vision loss Neg Hx     Social History  Substance Use Topics  . Smoking status: Never Smoker  . Smokeless tobacco: Never Used  . Alcohol use No     Comment: Every other weekend.     Allergies: No Known Allergies  Facility-Administered Medications Prior to Admission  Medication Dose Route Frequency Provider Last Rate Last Dose  . Tdap (BOOSTRIX) injection 0.5 mL  0.5 mL Intramuscular Once Truett Mainland, DO       No prescriptions prior to admission.    Review of Systems  Constitutional: Negative for fever.  HENT: Positive for postnasal drip and rhinorrhea. Negative for sinus pain and sinus pressure.   Respiratory: Negative for shortness of breath.   Cardiovascular: Negative for chest pain.  Gastrointestinal: Positive for diarrhea. Negative for abdominal pain, constipation and vomiting.  Genitourinary: Positive  for vaginal bleeding (intermittent spotting and bleeding throughout this pregnancy). Negative for dysuria, vaginal discharge and vaginal pain.  Musculoskeletal: Positive for back pain.  Neurological: Positive for headaches.   Physical Exam   Blood pressure 109/73, pulse 74, temperature 98.1 F (36.7 C), temperature source Oral, resp. rate 16, height 5' 4.57" (1.64 m), weight 84.4 kg (186 lb), last menstrual period 07/20/2016, SpO2 100 %, unknown if currently breastfeeding.  Physical Exam  Constitutional: She is oriented to person, place, and time. She appears well-developed and well-nourished. No distress.  HENT:  Head: Normocephalic and atraumatic.  Nose: Mucosal edema (erythematous bilateral nares) and rhinorrhea present. No epistaxis. Right sinus exhibits no maxillary sinus tenderness and no frontal sinus tenderness. Left sinus exhibits no maxillary sinus tenderness and no frontal sinus tenderness.  Mouth/Throat: Mucous membranes are not pale and not dry. No oropharyngeal exudate, posterior oropharyngeal edema, posterior oropharyngeal erythema or tonsillar abscesses.  Cardiovascular: Normal rate, regular rhythm and normal heart sounds.  Exam reveals no gallop and no friction rub.   No murmur heard. Respiratory: Effort normal and breath sounds normal. No respiratory distress. She has no wheezes. She has no rales.  GI: Soft. Bowel sounds are normal. There is no tenderness. There is no rebound and no guarding.  Genitourinary: There is no rash or tenderness on the right labia. There is no rash or tenderness on the left labia. Uterus is enlarged (appropriate for gestational age). Uterus is not tender. Cervix exhibits discharge (light pink spotting). Cervix exhibits no motion tenderness. Right adnexum displays no mass, no tenderness and no fullness. Left adnexum displays no mass, no tenderness and no fullness. There is bleeding (light pink spotting) in the vagina. No erythema in the vagina. No signs  of injury around the vagina. No vaginal discharge found.  Musculoskeletal: She exhibits no edema.  Neurological: She is alert and oriented to person, place, and time.  Skin: Skin is warm and dry.  Psychiatric: She has a normal mood and affect. Her behavior is normal.   Labs: Results for orders placed or performed during the hospital encounter of 09/20/16 (from the past 24 hour(s))  Urinalysis, Routine w reflex microscopic     Status: Abnormal   Collection Time: 09/20/16  1:32 PM  Result Value Ref Range   Color, Urine YELLOW YELLOW   APPearance HAZY (A) CLEAR   Specific Gravity, Urine 1.026 1.005 - 1.030   pH 5.0 5.0 - 8.0   Glucose, UA NEGATIVE NEGATIVE mg/dL   Hgb urine dipstick MODERATE (A) NEGATIVE  Bilirubin Urine NEGATIVE NEGATIVE   Ketones, ur NEGATIVE NEGATIVE mg/dL   Protein, ur NEGATIVE NEGATIVE mg/dL   Nitrite NEGATIVE NEGATIVE   Leukocytes, UA NEGATIVE NEGATIVE   RBC / HPF 0-5 0 - 5 RBC/hpf   WBC, UA 0-5 0 - 5 WBC/hpf   Bacteria, UA NONE SEEN NONE SEEN   Squamous Epithelial / LPF 0-5 (A) NONE SEEN   Mucous PRESENT   Wet prep, genital     Status: Abnormal   Collection Time: 09/20/16  3:20 PM  Result Value Ref Range   Yeast Wet Prep HPF POC NONE SEEN NONE SEEN   Trich, Wet Prep NONE SEEN NONE SEEN   Clue Cells Wet Prep HPF POC PRESENT (A) NONE SEEN   WBC, Wet Prep HPF POC MODERATE (A) NONE SEEN   Sperm NONE SEEN   CBC     Status: Abnormal   Collection Time: 09/20/16  3:28 PM  Result Value Ref Range   WBC 5.7 4.0 - 10.5 K/uL   RBC 3.81 (L) 3.87 - 5.11 MIL/uL   Hemoglobin 10.9 (L) 12.0 - 15.0 g/dL   HCT 32.9 (L) 36.0 - 46.0 %   MCV 86.4 78.0 - 100.0 fL   MCH 28.6 26.0 - 34.0 pg   MCHC 33.1 30.0 - 36.0 g/dL   RDW 13.5 11.5 - 15.5 %   Platelets 318 150 - 400 K/uL  hCG, quantitative, pregnancy     Status: Abnormal   Collection Time: 09/20/16  3:28 PM  Result Value Ref Range   hCG, Beta Chain, Quant, S 71,416 (H) <5 mIU/mL   -GC/Chlamydia: in process  OB  US:  CLINICAL DATA:  Spotting and cramping affecting pregnancy ; no IUP identified on prior ultrasound  EXAM: TRANSVAGINAL OB ULTRASOUND  TECHNIQUE: Transvaginal ultrasound was performed for complete evaluation of the gestation as well as the maternal uterus, adnexal regions, and pelvic cul-de-sac.  COMPARISON:  08/20/2016  FINDINGS: Intrauterine gestational sac: Present  Yolk sac:  Present  Embryo:  Present  Cardiac Activity: Absent  Heart Rate: N/A bpm  CRL:   3.8  mm   6 w 0 d                  [place] EDC:  Subchorionic hemorrhage:  Small subchronic hemorrhage  Maternal uterus/adnexae:  Large calcified leiomyoma within uterus 4.3 x 4.1 x 3.9 cm.  Nonvisualization of LEFT ovary question obscured by bowel.  RIGHT ovary measures 3.7 x 2.7 x 3.2 cm and contains a corpus luteal cyst.  No adnexal masses or free pelvic fluid.  IMPRESSION: Intrauterine gestation is identified with a 3.8 mm length fetal pole but no fetal cardiac activity is identified.  Findings are suspicious for but not definitive of a nonviable pregnancy.  MAU Course  Procedures: none  MDM Labs and [place] ordered and reviewed. US findings concerning for failed pregnancy d/t inconsistent size with LMP dating and FHR absent. Recommend f/u US in 7-10 days for viability. Discussed findings and recommendations with patient and she agrees with plan. No evidence of UTI. Back pain improved after Tylenol and heat. No current HA but I recommend Tylenol and hydration. Stable for discharge home.  Assessment and Plan   1. Headache in pregnancy, antepartum, first trimester   2. Abdominal cramping affecting pregnancy   3. Spotting affecting pregnancy   4. Back pain affecting pregnancy in first trimester   5. Blood type, Rh positive   6. Chronic allergic rhinitis due to other allergic trigger, unspecified  seasonality      PLAN: 1. Headache, back pain: Rx with 1000 mg tylenol [place] PRN. 2.  Bleeding in pregnancy: Expectant management;  Follow up U/S in 7-10 days outpatient. SAB precautions.  3. Allergic rhinitis: Recommended patient start taking Zyrtec daily for symptomatic relief. 4. Discharge to home   Julianne Handler, Medical Student 09/20/2016, 5:40 PM   I confirm that I have verified the information documented in the student's note and that I have also personally reperformed the physical exam and all medical decision making activities.  Julianne Handler, CNM  09/20/2016 5:40 PM

## 2016-09-20 NOTE — MAU Note (Signed)
Patient states she started having intermittent headaches and diarrhea 3 days ago.  Has had ongoing nausea in pregnancy, but no emesis.  Denies fever.  States some stools have been formed, but x2 watery stools in 24hrs.  C/o lower back pain that pt states "probably from lifting heavy boxes at work." Also complains that left hand is sore and swollen without explanation.

## 2016-09-23 LAB — GC/CHLAMYDIA PROBE AMP (~~LOC~~) NOT AT ARMC
CHLAMYDIA, DNA PROBE: NEGATIVE
Neisseria Gonorrhea: NEGATIVE

## 2016-09-26 ENCOUNTER — Ambulatory Visit (HOSPITAL_COMMUNITY)
Admission: RE | Admit: 2016-09-26 | Discharge: 2016-09-26 | Disposition: A | Discharge status: Home / Self Care | Payer: 59 | Source: Ambulatory Visit | Attending: Certified Nurse Midwife | Admitting: Certified Nurse Midwife

## 2016-09-26 ENCOUNTER — Ambulatory Visit (INDEPENDENT_AMBULATORY_CARE_PROVIDER_SITE_OTHER): Payer: 59 | Admitting: Obstetrics and Gynecology

## 2016-09-26 DIAGNOSIS — D259 Leiomyoma of uterus, unspecified: Secondary | ICD-10-CM | POA: Diagnosis not present

## 2016-09-26 DIAGNOSIS — O208 Other hemorrhage in early pregnancy: Secondary | ICD-10-CM | POA: Insufficient documentation

## 2016-09-26 DIAGNOSIS — O26899 Other specified pregnancy related conditions, unspecified trimester: Secondary | ICD-10-CM

## 2016-09-26 DIAGNOSIS — Z3A01 Less than 8 weeks gestation of pregnancy: Secondary | ICD-10-CM | POA: Insufficient documentation

## 2016-09-26 DIAGNOSIS — O3411 Maternal care for benign tumor of corpus uteri, first trimester: Secondary | ICD-10-CM | POA: Diagnosis not present

## 2016-09-26 DIAGNOSIS — O26851 Spotting complicating pregnancy, first trimester: Secondary | ICD-10-CM | POA: Diagnosis present

## 2016-09-26 DIAGNOSIS — O209 Hemorrhage in early pregnancy, unspecified: Secondary | ICD-10-CM | POA: Diagnosis not present

## 2016-09-26 DIAGNOSIS — O26859 Spotting complicating pregnancy, unspecified trimester: Secondary | ICD-10-CM

## 2016-09-26 DIAGNOSIS — R109 Unspecified abdominal pain: Secondary | ICD-10-CM

## 2016-09-26 NOTE — Progress Notes (Signed)
[place] scheduled for April 9th @ 1300.  Pt notified.

## 2016-09-26 NOTE — Progress Notes (Signed)
Pt seen for follow up after U/S d/t non progressing pregnancy.  09/20/16 U/S YS with FP but no cardiac activity. U/S today is the same.  U/S results reviewed with pt. Pt informed results are very probably for a failed pregnancy.   Management options reviewed with pt. Pt desires to have repeat U/S in one week and if remains failed pregnancy then proceed to management  Bleeding precautions reviewed with pt. Will schedule follow up U/S and office appt on same day .

## 2016-10-04 ENCOUNTER — Inpatient Hospital Stay (HOSPITAL_COMMUNITY)
Admission: AD | Admit: 2016-10-04 | Discharge: 2016-10-04 | Disposition: A | Discharge status: Home / Self Care | Payer: 59 | Source: Ambulatory Visit | Attending: Obstetrics & Gynecology | Admitting: Obstetrics & Gynecology

## 2016-10-04 ENCOUNTER — Encounter (HOSPITAL_COMMUNITY): Payer: Self-pay | Admitting: *Deleted

## 2016-10-04 ENCOUNTER — Inpatient Hospital Stay (HOSPITAL_COMMUNITY): Payer: 59

## 2016-10-04 DIAGNOSIS — O021 Missed abortion: Secondary | ICD-10-CM | POA: Insufficient documentation

## 2016-10-04 DIAGNOSIS — O209 Hemorrhage in early pregnancy, unspecified: Secondary | ICD-10-CM

## 2016-10-04 DIAGNOSIS — N939 Abnormal uterine and vaginal bleeding, unspecified: Secondary | ICD-10-CM | POA: Diagnosis present

## 2016-10-04 LAB — CBC
HCT: 32.1 % — ABNORMAL LOW (ref 36.0–46.0)
HEMOGLOBIN: 10.7 g/dL — AB (ref 12.0–15.0)
MCH: 28.7 pg (ref 26.0–34.0)
MCHC: 33.3 g/dL (ref 30.0–36.0)
MCV: 86.1 fL (ref 78.0–100.0)
PLATELETS: 302 10*3/uL (ref 150–400)
RBC: 3.73 MIL/uL — AB (ref 3.87–5.11)
RDW: 13.6 % (ref 11.5–15.5)
WBC: 4.6 10*3/uL (ref 4.0–10.5)

## 2016-10-04 LAB — HCG, QUANTITATIVE, PREGNANCY: HCG, BETA CHAIN, QUANT, S: 42999 m[IU]/mL — AB (ref <5)

## 2016-10-04 MED ORDER — MISOPROSTOL 200 MCG PO TABS: 800.0000 ug | ORAL_TABLET | Freq: Four times a day (QID) | ORAL | 1 refills | Status: DC

## 2016-10-04 MED ORDER — PROMETHAZINE HCL 12.5 MG PO TABS: 12.5000 mg | ORAL_TABLET | Freq: Four times a day (QID) | ORAL | 0 refills | Status: DC | PRN

## 2016-10-04 MED ORDER — OXYCODONE-ACETAMINOPHEN 5-325 MG PO TABS: 1.0000 | ORAL_TABLET | ORAL | 0 refills | Status: DC | PRN

## 2016-10-04 MED ORDER — IBUPROFEN 600 MG PO TABS: 600.0000 mg | ORAL_TABLET | Freq: Four times a day (QID) | ORAL | 0 refills | Status: DC | PRN

## 2016-10-04 NOTE — MAU Note (Signed)
c/o intermittent vaginal bleeding for past 2 days; c/o abdominal cramping started 2 days ago;

## 2016-10-04 NOTE — MAU Provider Note (Signed)
History     CSN: [id number]  Arrival date and time: 10/04/16 1026   First Provider Initiated Contact with Patient 10/04/16 1049      Chief Complaint  Patient presents with  . Vaginal Bleeding  . Abdominal Cramping   Patient is a 31 y.o G7P4 at 10 wks who presents with ongoing vaginal bleeding and cramping. She reports pain is 4/10. She did have bright red blood in the the toilet bowl this AM. She has not other complaints or concerns. She has had several ultrasounds confirming IUP, but never a viable pregnancy. She has not had a miscarage before. She has been bleeding intermittently for at least 2-3 wks now.     OB History    Gravida Para Term Preterm AB Living   7 4 4  0 2 4   SAB TAB Ectopic Multiple Live Births   1 1 0 0 4      Past Medical History:  Diagnosis Date  . Anemia    first and sec pregnancies  . Arrhythmia   . Chlamydia   . Fibroid   . Gestational diabetes    diet controlled  . Gonorrhea   . Headache(784.0)   . Hemorrhoids   . Hx of migraine headaches   . Leiomyoma of uterus in pregnancy     Past Surgical History:  Procedure Laterality Date  . INDUCED ABORTION  March  2 ,2013  . WISDOM TOOTH EXTRACTION      Family History  Problem Relation Age of Onset  . Other Neg Hx   . Alcohol abuse Neg Hx   . Arthritis Neg Hx   . Asthma Neg Hx   . Birth defects Neg Hx   . Cancer Neg Hx   . COPD Neg Hx   . Depression Neg Hx   . Diabetes Neg Hx   . Drug abuse Neg Hx   . Early death Neg Hx   . Heart disease Neg Hx   . Hearing loss Neg Hx   . Hyperlipidemia Neg Hx   . Hypertension Neg Hx   . Kidney disease Neg Hx   . Learning disabilities Neg Hx   . Mental illness Neg Hx   . Mental retardation Neg Hx   . Miscarriages / Stillbirths Neg Hx   . Stroke Neg Hx   . Vision loss Neg Hx     Social History  Substance Use Topics  . Smoking status: Never Smoker  . Smokeless tobacco: Never Used  . Alcohol use No     Comment: Every other weekend.      Allergies: No Known Allergies  No prescriptions prior to admission.    Review of Systems  Constitutional: Negative for chills and fever.  HENT: Negative for congestion and rhinorrhea.   Respiratory: Negative for cough and shortness of breath.   Cardiovascular: Negative for chest pain and palpitations.  Gastrointestinal: Negative for abdominal distention, abdominal pain, constipation, diarrhea, nausea and vomiting.  Genitourinary: Negative for dysuria, flank pain and frequency.  Neurological: Negative for dizziness and weakness.  Psychiatric/Behavioral: Negative for agitation, behavioral problems and suicidal ideas.   Physical Exam   Blood pressure 110/68, pulse 82, temperature 98.5 F (36.9 C), temperature source Oral, resp. rate 18, last menstrual period 07/20/2016, unknown if currently breastfeeding.  Physical Exam  Constitutional: She is oriented to person, place, and time. She appears well-developed and well-nourished.  HENT:  Head: Normocephalic and atraumatic.  Cardiovascular: Normal rate and intact distal pulses.   Respiratory: Effort  normal. No respiratory distress. She has no wheezes.  GI: Soft. Bowel sounds are normal. She exhibits no distension. There is no tenderness. There is no rebound and no guarding.  Genitourinary:  Genitourinary Comments: Vaginal with small amount of old blood. No clots. Cervix closed.   Musculoskeletal: Normal range of motion. She exhibits no edema.  Neurological: She is alert and oriented to person, place, and time.    MAU Course  Procedures  MDM In MAU patient underwent evaluation with HcG, CBC, and [place].  US revealed definitive non-viable pregnancy.   HcG was still pending on discharge.   D/W patient risks versus benefits of expectant management versus Cytotec and patient opted for Cytotec.  Early Intrauterine Pregnancy Failure Protocol X  Documented intrauterine pregnancy failure less than or equal to [redacted] weeks   gestation  X   No serious current illness  X  Baseline Hgb greater than or equal to 10g/dl  X  Patient has easily accessible transportation to the hospital  X  Clear preference  X  Practitioner/physician deems patient reliable  X  Counseling by practitioner or physician  X  Patient education by RN  X  Consent form signed       Rho-Gam given by RN if indicated  X  Medication dispensed  X  Cytotec 800 mcg Intravaginally by patient at home  X   Ibuprofen 600 mg 1 tablet by mouth every 6 hours as needed #30 - prescribed  X   Phenergan 12.5 mg by mouth every 4 hours as needed for nausea - prescribed  Reviewed with pt cytotec procedure.  Pt verbalizes that she lives close to the hospital and has transportation readily available.  Pt appears reliable and verbalizes understanding and agrees with plan of care     Assessment and Plan  #1: Missed AB: will treat with cytotec. prescription for motrin, percocet and phenergan given as well. Follow up in office in 2 wks.   Jacquiline Doe 10/04/2016, 11:37 AM

## 2016-10-04 NOTE — MAU Note (Signed)
Lot of blood in the toilet this morning.  Some cramping, not bad.  Not sure, thinks she had a miscarriage this morning.

## 2016-10-04 NOTE — Discharge Instructions (Signed)

## 2016-10-07 ENCOUNTER — Encounter: Payer: Self-pay | Admitting: Obstetrics and Gynecology

## 2016-10-07 ENCOUNTER — Ambulatory Visit (HOSPITAL_COMMUNITY): Admission: RE | Admit: 2016-10-07 | Payer: 59 | Source: Ambulatory Visit

## 2016-10-07 ENCOUNTER — Ambulatory Visit: Payer: 59 | Admitting: Obstetrics and Gynecology

## 2016-10-09 ENCOUNTER — Inpatient Hospital Stay (HOSPITAL_COMMUNITY)
Admission: AD | Admit: 2016-10-09 | Discharge: 2016-10-09 | Disposition: A | Discharge status: Home / Self Care | Payer: 59 | Source: Ambulatory Visit | Attending: Family Medicine | Admitting: Family Medicine

## 2016-10-09 ENCOUNTER — Inpatient Hospital Stay (HOSPITAL_COMMUNITY): Payer: 59

## 2016-10-09 ENCOUNTER — Encounter (HOSPITAL_COMMUNITY): Payer: Self-pay | Admitting: *Deleted

## 2016-10-09 DIAGNOSIS — O034 Incomplete spontaneous abortion without complication: Secondary | ICD-10-CM | POA: Diagnosis not present

## 2016-10-09 DIAGNOSIS — O020 Blighted ovum and nonhydatidiform mole: Secondary | ICD-10-CM | POA: Diagnosis not present

## 2016-10-09 DIAGNOSIS — R109 Unspecified abdominal pain: Secondary | ICD-10-CM | POA: Diagnosis present

## 2016-10-09 DIAGNOSIS — R102 Pelvic and perineal pain: Secondary | ICD-10-CM | POA: Diagnosis not present

## 2016-10-09 DIAGNOSIS — D252 Subserosal leiomyoma of uterus: Secondary | ICD-10-CM

## 2016-10-09 DIAGNOSIS — D250 Submucous leiomyoma of uterus: Secondary | ICD-10-CM | POA: Diagnosis not present

## 2016-10-09 LAB — CBC
HCT: 31 % — ABNORMAL LOW (ref 36.0–46.0)
Hemoglobin: 10.3 g/dL — ABNORMAL LOW (ref 12.0–15.0)
MCH: 28.8 pg (ref 26.0–34.0)
MCHC: 33.2 g/dL (ref 30.0–36.0)
MCV: 86.6 fL (ref 78.0–100.0)
PLATELETS: 293 10*3/uL (ref 150–400)
RBC: 3.58 MIL/uL — AB (ref 3.87–5.11)
RDW: 13.5 % (ref 11.5–15.5)
WBC: 5.6 10*3/uL (ref 4.0–10.5)

## 2016-10-09 LAB — URINALYSIS, ROUTINE W REFLEX MICROSCOPIC
BILIRUBIN URINE: NEGATIVE
Glucose, UA: NEGATIVE mg/dL
Ketones, ur: NEGATIVE mg/dL
LEUKOCYTES UA: NEGATIVE
Nitrite: NEGATIVE
PH: 5 (ref 5.0–8.0)
Protein, ur: NEGATIVE mg/dL
SPECIFIC GRAVITY, URINE: 1.025 (ref 1.005–1.030)
WBC, UA: NONE SEEN WBC/hpf (ref 0–5)

## 2016-10-09 MED ORDER — OXYCODONE-ACETAMINOPHEN 5-325 MG PO TABS: 1.0000 | ORAL_TABLET | ORAL | 0 refills | Status: DC | PRN

## 2016-10-09 MED ORDER — KETOROLAC TROMETHAMINE 60 MG/2ML IM SOLN
60.0000 mg | Freq: Once | INTRAMUSCULAR | Status: AC
  Administered 2016-10-09: 60 mg via INTRAMUSCULAR
  Filled 2016-10-09: qty 2

## 2016-10-09 NOTE — MAU Note (Signed)
Pt C/O lower abd cramping for the past 2 days, took cytotec on Friday, thinks she passed tissue Friday night.  Was feeling better until yesterday.  Has been taking percocet & ibuprofen, states it is not working.  Having light bleeding.

## 2016-10-09 NOTE — MAU Provider Note (Signed)
Chief Complaint: Abdominal Pain   First Provider Initiated Contact with Patient 10/09/16 1817      SUBJECTIVE HPI: Sonia Allen is a 31 y.o. [id number] at [redacted]w[redacted]d by LMP with known failed pregnancy treated with Cytotec 4 days ago who presents to maternity admissions reporting that after her bleeding and cramping on Friday and Saturday, her pain and bleeding decreased but then pain started yesterday and became more severe today.  It is not improved with Percocet that was prescribed.  Her bleeding is still scant.  Her pain is described as low abdominal cramping that is constant but increases and decreases every few minutes.  There are no other associated symptoms. She denies vaginal itching/burning, urinary symptoms, h/a, dizziness, n/v, or fever/chills.     HPI  Past Medical History:  Diagnosis Date  . Anemia    first and sec pregnancies  . Arrhythmia   . Chlamydia   . Fibroid   . Gestational diabetes    diet controlled  . Gonorrhea   . Headache(784.0)   . Hemorrhoids   . Hx of migraine headaches   . Leiomyoma of uterus in pregnancy    Past Surgical History:  Procedure Laterality Date  . INDUCED ABORTION  March  2 ,2013  . WISDOM TOOTH EXTRACTION     Social History   Social History  . Marital status: Single    Spouse name: N/A  . Number of children: N/A  . Years of education: N/A   Occupational History  . Not on file.   Social History Main Topics  . Smoking status: Never Smoker  . Smokeless tobacco: Never Used  . Alcohol use No     Comment: Every other weekend.   . Drug use: No  . Sexual activity: Yes    Birth control/ protection: None   Other Topics Concern  . Not on file   Social History Narrative  . No narrative on file   No current facility-administered medications on file prior to encounter.    Current Outpatient Prescriptions on File Prior to Encounter  Medication Sig Dispense Refill  . ibuprofen (ADVIL,MOTRIN) 600 MG tablet Take 1 tablet (600 mg total)  by mouth every 6 (six) hours as needed for headache. 30 tablet 0  . misoprostol (CYTOTEC) 200 MCG tablet Take 4 tablets (800 mcg total) by mouth 4 (four) times daily. 4 tablet 1  . promethazine (PHENERGAN) 12.5 MG tablet Take 1 tablet (12.5 mg total) by mouth every 6 (six) hours as needed for nausea or vomiting. 15 tablet 0   No Known Allergies  ROS:  Review of Systems  Constitutional: Negative for chills, fatigue and fever.  Respiratory: Negative for shortness of breath.   Cardiovascular: Negative for chest pain.  Gastrointestinal: Negative for nausea and vomiting.  Genitourinary: Positive for pelvic pain. Negative for difficulty urinating, dysuria, flank pain, vaginal bleeding, vaginal discharge and vaginal pain.  Neurological: Negative for dizziness and headaches.  Psychiatric/Behavioral: Negative.      I have reviewed patient's Past Medical Hx, Surgical Hx, Family Hx, Social Hx, medications and allergies.   Physical Exam   Patient Vitals for the past 24 hrs:  BP Temp Temp src Pulse Resp  10/09/16 1640 124/68 98.3 F (36.8 C) Oral 92 18   Constitutional: Well-developed, well-nourished female in no acute distress.  Cardiovascular: normal rate Respiratory: normal effort GI: Abd soft, non-tender. Pos BS x 4 MS: Extremities nontender, no edema, normal ROM Neurologic: Alert and oriented x 4.  GU: Neg CVAT.  PELVIC EXAM: Deferred  LAB RESULTS Results for orders placed or performed during the hospital encounter of 10/09/16 (from the past 24 hour(s))  Urinalysis, Routine w reflex microscopic     Status: Abnormal   Collection Time: 10/09/16  4:43 PM  Result Value Ref Range   Color, Urine YELLOW YELLOW   APPearance CLEAR CLEAR   Specific Gravity, Urine 1.025 1.005 - 1.030   pH 5.0 5.0 - 8.0   Glucose, UA NEGATIVE NEGATIVE mg/dL   Hgb urine dipstick LARGE (A) NEGATIVE   Bilirubin Urine NEGATIVE NEGATIVE   Ketones, ur NEGATIVE NEGATIVE mg/dL   Protein, ur NEGATIVE NEGATIVE  mg/dL   Nitrite NEGATIVE NEGATIVE   Leukocytes, UA NEGATIVE NEGATIVE   RBC / HPF TOO NUMEROUS TO COUNT 0 - 5 RBC/hpf   WBC, UA NONE SEEN 0 - 5 WBC/hpf   Bacteria, UA RARE (A) NONE SEEN   Squamous Epithelial / LPF 0-5 (A) NONE SEEN   Mucous PRESENT   CBC     Status: Abnormal   Collection Time: 10/09/16  6:18 PM  Result Value Ref Range   WBC 5.6 4.0 - 10.5 K/uL   RBC 3.58 (L) 3.87 - 5.11 MIL/uL   Hemoglobin 10.3 (L) 12.0 - 15.0 g/dL   HCT 31.0 (L) 36.0 - 46.0 %   MCV 86.6 78.0 - 100.0 fL   MCH 28.8 26.0 - 34.0 pg   MCHC 33.2 30.0 - 36.0 g/dL   RDW 13.5 11.5 - 15.5 %   Platelets 293 150 - 400 K/uL       IMAGING US Ob Transvaginal  Result Date: 10/09/2016 CLINICAL DATA:  Incomplete miscarriage, status post Cytotec on Friday EXAM: TRANSVAGINAL OB ULTRASOUND TECHNIQUE: Transvaginal ultrasound was performed for complete evaluation of the gestation as well as the maternal uterus, adnexal regions, and pelvic cul-de-sac. COMPARISON:  10/04/2016 FINDINGS: Intrauterine gestational sac: None Yolk sac:  Not Visualized. Embryo:  Not Visualized. Subchorionic hemorrhage:  None visualized. Maternal uterus/adnexae: Endometrial complex measures 10 mm. Calcified 4.6 x 3.8 x 4.2 cm subserosal fibroid along the right uterine body. Right ovary is within normal limits. Left ovary is grossly unremarkable. Trace pelvic fluid. IMPRESSION: No IUP is visualized status post Cytotec administration. Endometrial complex measures 10 mm.  No focal abnormality is seen. Is patient has persistent bleeding or HCG does not decline appropriately, consider follow-up pelvic ultrasound in 10-14 days to assess for RPOC. Electronically Signed   By: Julian Hy M.D.   On: 10/09/2016 19:48   US Ob Transvaginal  Result Date: 10/04/2016 CLINICAL DATA:  Increased vaginal bleeding with abdominal pain today EXAM: TRANSVAGINAL OB ULTRASOUND TECHNIQUE: Transvaginal ultrasound was performed for complete evaluation of the gestation as  well as the maternal uterus, adnexal regions, and pelvic cul-de-sac. COMPARISON:  09/26/2016 FINDINGS: Intrauterine gestational sac: Present, irregular Yolk sac:  Absent Embryo:  Absent Cardiac Activity: N/A Heart Rate: N/A bpm MSD: 14.2  mm   6 w   2  d Subchorionic hemorrhage: Large subchronic hemorrhage 4.1 x 2.2 x 1.1 cm. Maternal uterus/adnexae: Partially calcified leiomyoma RIGHT uterus 4.4 x 3.6 x 3.6 cm. RIGHT ovary normal size and morphology, 2.7 x 1.7 x 3.8 cm. Nonvisualization of LEFT ovary question obscured by bowel. Trace free pelvic fluid. No adnexal masses. IMPRESSION: Irregular gestational sac within the uterus without fetal pole or yolk sac. Large subchronic hemorrhage. The absence of an embryo 2 weeks after a scan that showed a gestational sac without a yolk sac is consistent with a nonviable  pregnancy. Electronically Signed   By: Lavonia Dana M.D.   On: 10/04/2016 11:45   US Ob Transvaginal  Result Date: 09/26/2016 CLINICAL DATA:  Abdominal cramping and spotting, pregnant, quatitative beta hCG = 71,416 EXAM: TRANSVAGINAL OB ULTRASOUND TECHNIQUE: Transvaginal ultrasound was performed for complete evaluation of the gestation as well as the maternal uterus, adnexal regions, and pelvic cul-de-sac. COMPARISON:  None. FINDINGS: Intrauterine gestational sac: Present Yolk sac:  Absent Embryo:  Questionable embryo Cardiac Activity: Absent Heart Rate: N/A bpm MSD: 3.1  mm   5 w   6  d Subchorionic hemorrhage:  Large Maternal uterus/adnexae: Large calcified uterine leiomyoma again identified 4.4 cm maximum diameter. Ovaries unremarkable. No free fluid. IMPRESSION: Gestational sac with questionable fetal pole unchanged since prior exam. Large subchorionic hemorrhage. No fetal cardiac activity identified. Based on size and lack of change over 6 days, findings are suspicious but not yet definitive for failed pregnancy. Recommend follow-up US in additional 7 days for definitive diagnosis. This recommendation  follows SRU consensus guidelines: Diagnostic Criteria for Nonviable Pregnancy Early in the First Trimester. [employer] Med 2013; 709:6283-66. Electronically Signed   By: Lavonia Dana M.D.   On: 09/26/2016 15:50   US Ob Transvaginal  Result Date: 09/20/2016 CLINICAL DATA:  Spotting and cramping affecting pregnancy ; no IUP identified on prior ultrasound EXAM: TRANSVAGINAL OB ULTRASOUND TECHNIQUE: Transvaginal ultrasound was performed for complete evaluation of the gestation as well as the maternal uterus, adnexal regions, and pelvic cul-de-sac. COMPARISON:  08/20/2016 FINDINGS: Intrauterine gestational sac: Present Yolk sac:  Present Embryo:  Present Cardiac Activity: Absent Heart Rate: N/A bpm CRL:   3.8  mm   6 w 0 d                  [place] EDC: Subchorionic hemorrhage:  Small subchronic hemorrhage Maternal uterus/adnexae: Large calcified leiomyoma within uterus 4.3 x 4.1 x 3.9 cm. Nonvisualization of LEFT ovary question obscured by bowel. RIGHT ovary measures 3.7 x 2.7 x 3.2 cm and contains a corpus luteal cyst. No adnexal masses or free pelvic fluid. IMPRESSION: Intrauterine gestation is identified with a 3.8 mm length fetal pole but no fetal cardiac activity is identified. Findings are suspicious for but not definitive of a nonviable pregnancy. Electronically Signed   By: Lavonia Dana M.D.   On: 09/20/2016 16:36    MAU Management/MDM: Ordered labs and [place] and reviewed results.  No s/sx of infection or retained POCs.  Consult Dr Elonda Husky with presentation and findings.   D/C home with pain management with Toradol 60 mg IM x 1 dose in MAU then Rx for Percocet 5/325, take 1-2 Q 6 hours x 10 tabs. Message sent to establish f/u visit in 2 weeks in [place].  Pt stable at time of discharge.  ASSESSMENT 1. Submucous and subserous leiomyoma of uterus   2. Incomplete miscarriage   3. Blighted ovum   4. Pelvic pain in female     PLAN Discharge home Allergies as of 10/09/2016   No Known Allergies     Medication List     STOP taking these medications   misoprostol 200 MCG tablet Commonly known as:  CYTOTEC     TAKE these medications   ibuprofen 600 MG tablet Commonly known as:  MOTRIN IB Take 1 tablet (600 mg total) by mouth every 6 (six) hours as needed for headache.   oxyCODONE-acetaminophen 5-325 MG tablet Commonly known as:  ROXICET Take 1-2 tablets by mouth every 4 (four) hours  as needed for severe pain. What changed:  how much to take   promethazine 12.5 MG tablet Commonly known as:  PHENERGAN Take 1 tablet (12.5 mg total) by mouth every 6 (six) hours as needed for nausea or vomiting.      Follow-up [facility] [place] Follow up.   Specialty:  Obstetrics and Gynecology Why:  You have an appointment on 10/21/16 at 2:40 pm.  Return to MAU as needed for emergencies. Contact information: [address] [place] Certified Nurse-Midwife 10/09/2016  8:25 PM

## 2016-10-16 ENCOUNTER — Ambulatory Visit: Payer: 59 | Admitting: Obstetrics and Gynecology

## 2016-10-21 ENCOUNTER — Ambulatory Visit: Payer: 59 | Admitting: Obstetrics and Gynecology

## 2016-10-29 ENCOUNTER — Encounter (HOSPITAL_COMMUNITY): Payer: Self-pay

## 2016-10-29 ENCOUNTER — Inpatient Hospital Stay (HOSPITAL_COMMUNITY)
Admission: AD | Admit: 2016-10-29 | Discharge: 2016-10-29 | Disposition: A | Discharge status: Home / Self Care | Payer: 59 | Source: Ambulatory Visit | Attending: Obstetrics and Gynecology | Admitting: Obstetrics and Gynecology

## 2016-10-29 ENCOUNTER — Inpatient Hospital Stay (HOSPITAL_COMMUNITY): Payer: 59

## 2016-10-29 DIAGNOSIS — R112 Nausea with vomiting, unspecified: Secondary | ICD-10-CM | POA: Diagnosis present

## 2016-10-29 DIAGNOSIS — O3680X0 Pregnancy with inconclusive fetal viability, not applicable or unspecified: Secondary | ICD-10-CM

## 2016-10-29 DIAGNOSIS — O26891 Other specified pregnancy related conditions, first trimester: Secondary | ICD-10-CM | POA: Diagnosis not present

## 2016-10-29 DIAGNOSIS — R102 Pelvic and perineal pain: Secondary | ICD-10-CM | POA: Diagnosis not present

## 2016-10-29 DIAGNOSIS — R109 Unspecified abdominal pain: Secondary | ICD-10-CM | POA: Diagnosis present

## 2016-10-29 LAB — URINALYSIS, ROUTINE W REFLEX MICROSCOPIC
Bilirubin Urine: NEGATIVE
Glucose, UA: NEGATIVE mg/dL
Hgb urine dipstick: NEGATIVE
KETONES UR: NEGATIVE mg/dL
Leukocytes, UA: NEGATIVE
NITRITE: NEGATIVE
PH: 6 (ref 5.0–8.0)
Protein, ur: NEGATIVE mg/dL
SPECIFIC GRAVITY, URINE: 1.026 (ref 1.005–1.030)

## 2016-10-29 LAB — WET PREP, GENITAL
Sperm: NONE SEEN
Trich, Wet Prep: NONE SEEN
Yeast Wet Prep HPF POC: NONE SEEN

## 2016-10-29 LAB — CBC
HEMATOCRIT: 30.7 % — AB (ref 36.0–46.0)
Hemoglobin: 10.1 g/dL — ABNORMAL LOW (ref 12.0–15.0)
MCH: 28.7 pg (ref 26.0–34.0)
MCHC: 32.9 g/dL (ref 30.0–36.0)
MCV: 87.2 fL (ref 78.0–100.0)
PLATELETS: 340 10*3/uL (ref 150–400)
RBC: 3.52 MIL/uL — ABNORMAL LOW (ref 3.87–5.11)
RDW: 13.7 % (ref 11.5–15.5)
WBC: 6.4 10*3/uL (ref 4.0–10.5)

## 2016-10-29 LAB — HCG, QUANTITATIVE, PREGNANCY: hCG, Beta Chain, Quant, S: 112 m[IU]/mL — ABNORMAL HIGH (ref <5)

## 2016-10-29 MED ORDER — METOCLOPRAMIDE HCL 10 MG PO TABS: 10.0000 mg | ORAL_TABLET | Freq: Three times a day (TID) | ORAL | 0 refills | Status: DC

## 2016-10-29 MED ORDER — METOCLOPRAMIDE HCL 10 MG PO TABS
10.0000 mg | ORAL_TABLET | Freq: Once | ORAL | Status: AC
  Administered 2016-10-29: 10 mg via ORAL
  Filled 2016-10-29: qty 1

## 2016-10-29 NOTE — Discharge Instructions (Signed)
Abdominal Pain During Pregnancy  Abdominal pain is common in pregnancy. Most of the time, it does not cause harm. There are many causes of abdominal pain. Some causes are more serious than others and sometimes the cause is not known. Abdominal pain can be a sign that something is very wrong with the pregnancy or the pain may have nothing to do with the pregnancy. Always tell your health care provider if you have any abdominal pain.  Follow these instructions at home:  · Do not have sex or put anything in your vagina until your symptoms go away completely.  · Watch your abdominal pain for any changes.  · Get plenty of rest until your pain improves.  · Drink enough fluid to keep your urine clear or pale yellow.  · Take over-the-counter or prescription medicines only as told by your health care provider.  · Keep all follow-up visits as told by your health care provider. This is important.  Contact a health care provider if:  · You have a fever.  · Your pain gets worse or you have cramping.  · Your pain continues after resting.  Get help right away if:  · You are bleeding, leaking fluid, or passing tissue from the vagina.  · You have vomiting or diarrhea that does not go away.  · You have painful or bloody urination.  · You notice a decrease in your baby's movements.  · You feel very weak or faint.  · You have shortness of breath.  · You develop a severe headache with abdominal pain.  · You have abnormal vaginal discharge with abdominal pain.  This information is not intended to replace advice given to you by your health care provider. Make sure you discuss any questions you have with your health care provider.  Document Released: 06/17/2005 Document Revised: 03/28/2016 Document Reviewed: 01/14/2013  Elsevier Interactive Patient Education © 2017 [employer] Inc[employer]

## 2016-10-29 NOTE — MAU Provider Note (Signed)
Chief Complaint: Nausea and Abdominal Pain   First Provider Initiated Contact with Patient 10/29/16 1118      SUBJECTIVE HPI: Sonia Allen is a 31 y.o. [id number] who presents to maternity admissions reporting nausea and abdominal cramping x 5 days.  She reports her bleeding/pain after miscarriage stopped earlier this month and then new onset pain and bleeding started this week.  She reports concerns that this is a new pregnancy. She describes the abdominal cramping as all over her abdomen, mid and lower abdomen intermittent pain.  It is associated with nausea but no vomiting, and with diarrhea x 2 in 24 hours. She reports possible exposure to GI virus since her son has had mild n/v/d in the last few days.  She has not tried any treatments, nothing makes her symptoms better or worse. She denies vaginal bleeding, vaginal itching/burning, urinary symptoms, h/a, dizziness,or fever/chills.     HPI  Past Medical History:  Diagnosis Date  . Anemia    first and sec pregnancies  . Arrhythmia   . Chlamydia   . Fibroid   . Gestational diabetes    diet controlled  . Gonorrhea   . Headache(784.0)   . Hemorrhoids   . Hx of migraine headaches   . Leiomyoma of uterus in pregnancy    Past Surgical History:  Procedure Laterality Date  . INDUCED ABORTION  March  2 ,2013  . WISDOM TOOTH EXTRACTION     Social History   Social History  . Marital status: Single    Spouse name: N/A  . Number of children: N/A  . Years of education: N/A   Occupational History  . Not on file.   Social History Main Topics  . Smoking status: Never Smoker  . Smokeless tobacco: Never Used  . Alcohol use No     Comment: Every other weekend.   . Drug use: No  . Sexual activity: Yes    Birth control/ protection: None   Other Topics Concern  . Not on file   Social History Narrative  . No narrative on file   No current facility-administered medications on file prior to encounter.    No current outpatient  prescriptions on file prior to encounter.   No Known Allergies  ROS:  Review of Systems  Constitutional: Negative for chills, fatigue and fever.  Respiratory: Negative for shortness of breath.   Cardiovascular: Negative for chest pain.  Gastrointestinal: Positive for abdominal pain, diarrhea and nausea.  Genitourinary: Positive for pelvic pain. Negative for difficulty urinating, dysuria, flank pain, vaginal bleeding, vaginal discharge and vaginal pain.  Neurological: Negative for dizziness and headaches.  Psychiatric/Behavioral: Negative.      I have reviewed patient's Past Medical Hx, Surgical Hx, Family Hx, Social Hx, medications and allergies.   Physical Exam   Patient Vitals for the past 24 hrs:  BP Temp Temp src Pulse Resp SpO2 Height Weight  10/29/16 1509 106/66 - - 68 18 - - -  10/29/16 1110 116/66 98.9 F (37.2 C) Oral 82 17 100 % 5\' 5"  (1.651 m) 186 lb 12 oz (84.7 kg)   Constitutional: Well-developed, well-nourished female in no acute distress.  Cardiovascular: normal rate Respiratory: normal effort GI: Abd soft, non-tender. , no rebound tenderness or guarding.  Pos BS x 4 MS: Extremities nontender, no edema, normal ROM Neurologic: Alert and oriented x 4.  GU: Neg CVAT.  PELVIC EXAM: Wet prep and GC collected by blind swab   LAB RESULTS Results for orders placed or performed  during the hospital encounter of 10/29/16 (from the past 24 hour(s))  Urinalysis, Routine w reflex microscopic     Status: None   Collection Time: 10/29/16 11:00 AM  Result Value Ref Range   Color, Urine YELLOW YELLOW   APPearance CLEAR CLEAR   Specific Gravity, Urine 1.026 1.005 - 1.030   pH 6.0 5.0 - 8.0   Glucose, UA NEGATIVE NEGATIVE mg/dL   Hgb urine dipstick NEGATIVE NEGATIVE   Bilirubin Urine NEGATIVE NEGATIVE   Ketones, ur NEGATIVE NEGATIVE mg/dL   Protein, ur NEGATIVE NEGATIVE mg/dL   Nitrite NEGATIVE NEGATIVE   Leukocytes, UA NEGATIVE NEGATIVE  hCG, quantitative, pregnancy      Status: Abnormal   Collection Time: 10/29/16 11:20 AM  Result Value Ref Range   hCG, Beta Chain, Quant, S 112 (H) <5 mIU/mL  CBC     Status: Abnormal   Collection Time: 10/29/16 11:20 AM  Result Value Ref Range   WBC 6.4 4.0 - 10.5 K/uL   RBC 3.52 (L) 3.87 - 5.11 MIL/uL   Hemoglobin 10.1 (L) 12.0 - 15.0 g/dL   HCT 30.7 (L) 36.0 - 46.0 %   MCV 87.2 78.0 - 100.0 fL   MCH 28.7 26.0 - 34.0 pg   MCHC 32.9 30.0 - 36.0 g/dL   RDW 13.7 11.5 - 15.5 %   Platelets 340 150 - 400 K/uL  Wet prep, genital     Status: Abnormal   Collection Time: 10/29/16 11:47 AM  Result Value Ref Range   Yeast Wet Prep HPF POC NONE SEEN NONE SEEN   Trich, Wet Prep NONE SEEN NONE SEEN   Clue Cells Wet Prep HPF POC PRESENT (A) NONE SEEN   WBC, Wet Prep HPF POC FEW (A) NONE SEEN   Sperm NONE SEEN        IMAGING US Ob Transvaginal  Result Date: 10/29/2016 CLINICAL DATA:  Abdominal pain for 3 days EXAM: TRANSVAGINAL OB ULTRASOUND TECHNIQUE: Transvaginal ultrasound was performed for complete evaluation of the gestation as well as the maternal uterus, adnexal regions, and pelvic cul-de-sac. COMPARISON:  10/09/2016 FINDINGS: Intrauterine gestational sac: None visualized Yolk sac:  Not Visualized. Embryo:  Not Visualized. Cardiac Activity: Not Visualized. Heart Rate:  bpm MSD:   mm    w     d CRL:     mm    w  d                  [place] EDC: Subchorionic hemorrhage:  None visualized. Maternal uterus/adnexae: No adnexal masses. No free fluid. Endometrium is thickened and heterogeneous, measuring up to 19 mm in thickness. Internal blood flow. Cannot exclude retained products of conception. IMPRESSION: No intrauterine gestation. There is thickened, heterogeneous endometrium with blood flow. Cannot exclude retained products of conception. Electronically Signed   By: Rolm Baptise M.D.   On: 10/29/2016 13:59   US Ob Transvaginal  Result Date: 10/09/2016 CLINICAL DATA:  Incomplete miscarriage, status post Cytotec on Friday EXAM:  TRANSVAGINAL OB ULTRASOUND TECHNIQUE: Transvaginal ultrasound was performed for complete evaluation of the gestation as well as the maternal uterus, adnexal regions, and pelvic cul-de-sac. COMPARISON:  10/04/2016 FINDINGS: Intrauterine gestational sac: None Yolk sac:  Not Visualized. Embryo:  Not Visualized. Subchorionic hemorrhage:  None visualized. Maternal uterus/adnexae: Endometrial complex measures 10 mm. Calcified 4.6 x 3.8 x 4.2 cm subserosal fibroid along the right uterine body. Right ovary is within normal limits. Left ovary is grossly unremarkable. Trace pelvic fluid. IMPRESSION: No IUP is visualized status  post Cytotec administration. Endometrial complex measures 10 mm.  No focal abnormality is seen. Is patient has persistent bleeding or HCG does not decline appropriately, consider follow-up pelvic ultrasound in 10-14 days to assess for RPOC. Electronically Signed   By: Julian Hy M.D.   On: 10/09/2016 19:48   US Ob Transvaginal  Result Date: 10/04/2016 CLINICAL DATA:  Increased vaginal bleeding with abdominal pain today EXAM: TRANSVAGINAL OB ULTRASOUND TECHNIQUE: Transvaginal ultrasound was performed for complete evaluation of the gestation as well as the maternal uterus, adnexal regions, and pelvic cul-de-sac. COMPARISON:  09/26/2016 FINDINGS: Intrauterine gestational sac: Present, irregular Yolk sac:  Absent Embryo:  Absent Cardiac Activity: N/A Heart Rate: N/A bpm MSD: 14.2  mm   6 w   2  d Subchorionic hemorrhage: Large subchronic hemorrhage 4.1 x 2.2 x 1.1 cm. Maternal uterus/adnexae: Partially calcified leiomyoma RIGHT uterus 4.4 x 3.6 x 3.6 cm. RIGHT ovary normal size and morphology, 2.7 x 1.7 x 3.8 cm. Nonvisualization of LEFT ovary question obscured by bowel. Trace free pelvic fluid. No adnexal masses. IMPRESSION: Irregular gestational sac within the uterus without fetal pole or yolk sac. Large subchronic hemorrhage. The absence of an embryo 2 weeks after a scan that showed a  gestational sac without a yolk sac is consistent with a nonviable pregnancy. Electronically Signed   By: Lavonia Dana M.D.   On: 10/04/2016 11:45    MAU Management/MDM: Ordered labs and reviewed results. Quant hcg is 112 today, inconclusive for unresolved previous pregnancy vs new pregnancy.  Findings today could represent a normal early pregnancy, spontaneous abortion or ectopic pregnancy which can be life-threatening.  Consult Dr Elly Modena to review assessment and findings. Ectopic precautions were given to the patient with plan to return in 48 hours for repeat quant hcg to evaluate pregnancy development.  Return sooner if symptoms worsen.  Pt stable at time of discharge.  ASSESSMENT 1. Pregnancy of unknown anatomic location   2. Abdominal pain during pregnancy, first trimester     PLAN Discharge home with ectopic precautions Return to MAU in 48 hours [facility] office closed)  Allergies as of 10/29/2016   No Known Allergies     Medication List    STOP taking these medications   ibuprofen 600 MG tablet Commonly known as:  MOTRIN IB   oxyCODONE-acetaminophen 5-325 MG tablet Commonly known as:  ROXICET   promethazine 12.5 MG tablet Commonly known as:  PHENERGAN     TAKE these medications   metoCLOPramide 10 MG tablet Commonly known as:  REGLAN Take 1 tablet (10 mg total) by mouth 3 (three) times daily before meals.      Follow-up [place] Follow up.   Why:  Return to MAU on Thursday, Oct 31, 2016, at 1:00 pm for repeat labwork.  Return sooner as needed for emergencies. Contact information: [place] [id number] [place] Certified Nurse-Midwife 10/29/2016  4:15 PM

## 2016-10-29 NOTE — MAU Note (Signed)
Pt c/o abdominal pain that started 3 days ago. Pt states the pain comes and goes. Pt c/o nausea that started 5 days ago. Pt denies bleeding. Pt denies vaginal discharge.

## 2016-10-30 LAB — GC/CHLAMYDIA PROBE AMP (~~LOC~~) NOT AT ARMC
Chlamydia: NEGATIVE
Neisseria Gonorrhea: NEGATIVE

## 2016-10-31 ENCOUNTER — Encounter: Payer: Self-pay | Admitting: General Practice

## 2016-10-31 ENCOUNTER — Inpatient Hospital Stay (HOSPITAL_COMMUNITY)
Admission: AD | Admit: 2016-10-31 | Discharge: 2016-10-31 | Disposition: A | Discharge status: Home / Self Care | Payer: 59 | Source: Ambulatory Visit | Attending: Obstetrics and Gynecology | Admitting: Obstetrics and Gynecology

## 2016-10-31 ENCOUNTER — Telehealth: Payer: Self-pay | Admitting: General Practice

## 2016-10-31 ENCOUNTER — Encounter (HOSPITAL_COMMUNITY): Payer: Self-pay | Admitting: *Deleted

## 2016-10-31 DIAGNOSIS — O039 Complete or unspecified spontaneous abortion without complication: Secondary | ICD-10-CM

## 2016-10-31 LAB — HCG, QUANTITATIVE, PREGNANCY: hCG, Beta Chain, Quant, S: 81 m[IU]/mL — ABNORMAL HIGH (ref <5)

## 2016-10-31 NOTE — MAU Note (Signed)
Pt called back to room 6 to be further evaluated for her pelvic pain.  Pt states she doesn't need to be examined, this was done two days ago.  Informed pt if she has increasing pain then she would need to be seen.  Pt states her pain is not "that bad" & she doesn't feel she needs further evaluation.  Pt back to lobby. Elizabeth Palau CNM informed.

## 2016-10-31 NOTE — Telephone Encounter (Signed)
Called patient to discuss bhcg appt. Patient states she is not able to come in until 3 this afternoon. Told patient we will be closed and she will need to go to MAU. Patient verbalized understanding & had no questions

## 2016-10-31 NOTE — MAU Provider Note (Addendum)
S. She is here for repeat QBHCG. She denies any increase in pain. She declines an exam. O. BP 121/61   Pulse 95   Temp 98.5 F (36.9 C) (Oral)   Resp 18   LMP 07/20/2016      [id number] Breathing, conversing, and ambulating normally QBHCG- 81 (decreasing)  A/P. Complete miscarriage, falling QBHCG Rec weekly QBHCG until less than 5 She would like another pregnancy this year. I have rec'd that she start MVI daily.

## 2016-10-31 NOTE — MAU Note (Signed)
Pt here for f/u BHCG. Reports some vaginal/pelvic  And a sharp shooting pain ion her right side  that started earlier today

## 2016-11-04 ENCOUNTER — Telehealth: Payer: Self-pay | Admitting: *Deleted

## 2016-11-04 ENCOUNTER — Ambulatory Visit: Payer: 59

## 2016-11-04 NOTE — Telephone Encounter (Signed)
Called pt and informed her of hormone level result from 5/3.  Per Dr. Hulan Fray, she will need to have test done weekly on thursdays in the office until the result is <5.  Pt voiced understanding and stated that she cannot be here any earlier than 4pm, maybe not til 4:15.  I advised pt to do her best to be here @ 4 pm.  She agreed.

## 2016-11-07 ENCOUNTER — Other Ambulatory Visit: Payer: 59

## 2017-03-21 IMAGING — CR DG CHEST 2V
2 series · 2 of 2 positions shown · non-contrast
Comparison: Chest x-ray of 02/22/2016

CLINICAL DATA: Intermittent sharp chest pain, some nausea over the
last 2-3 months

EXAM:
CHEST  2 VIEW

[w chest pa]
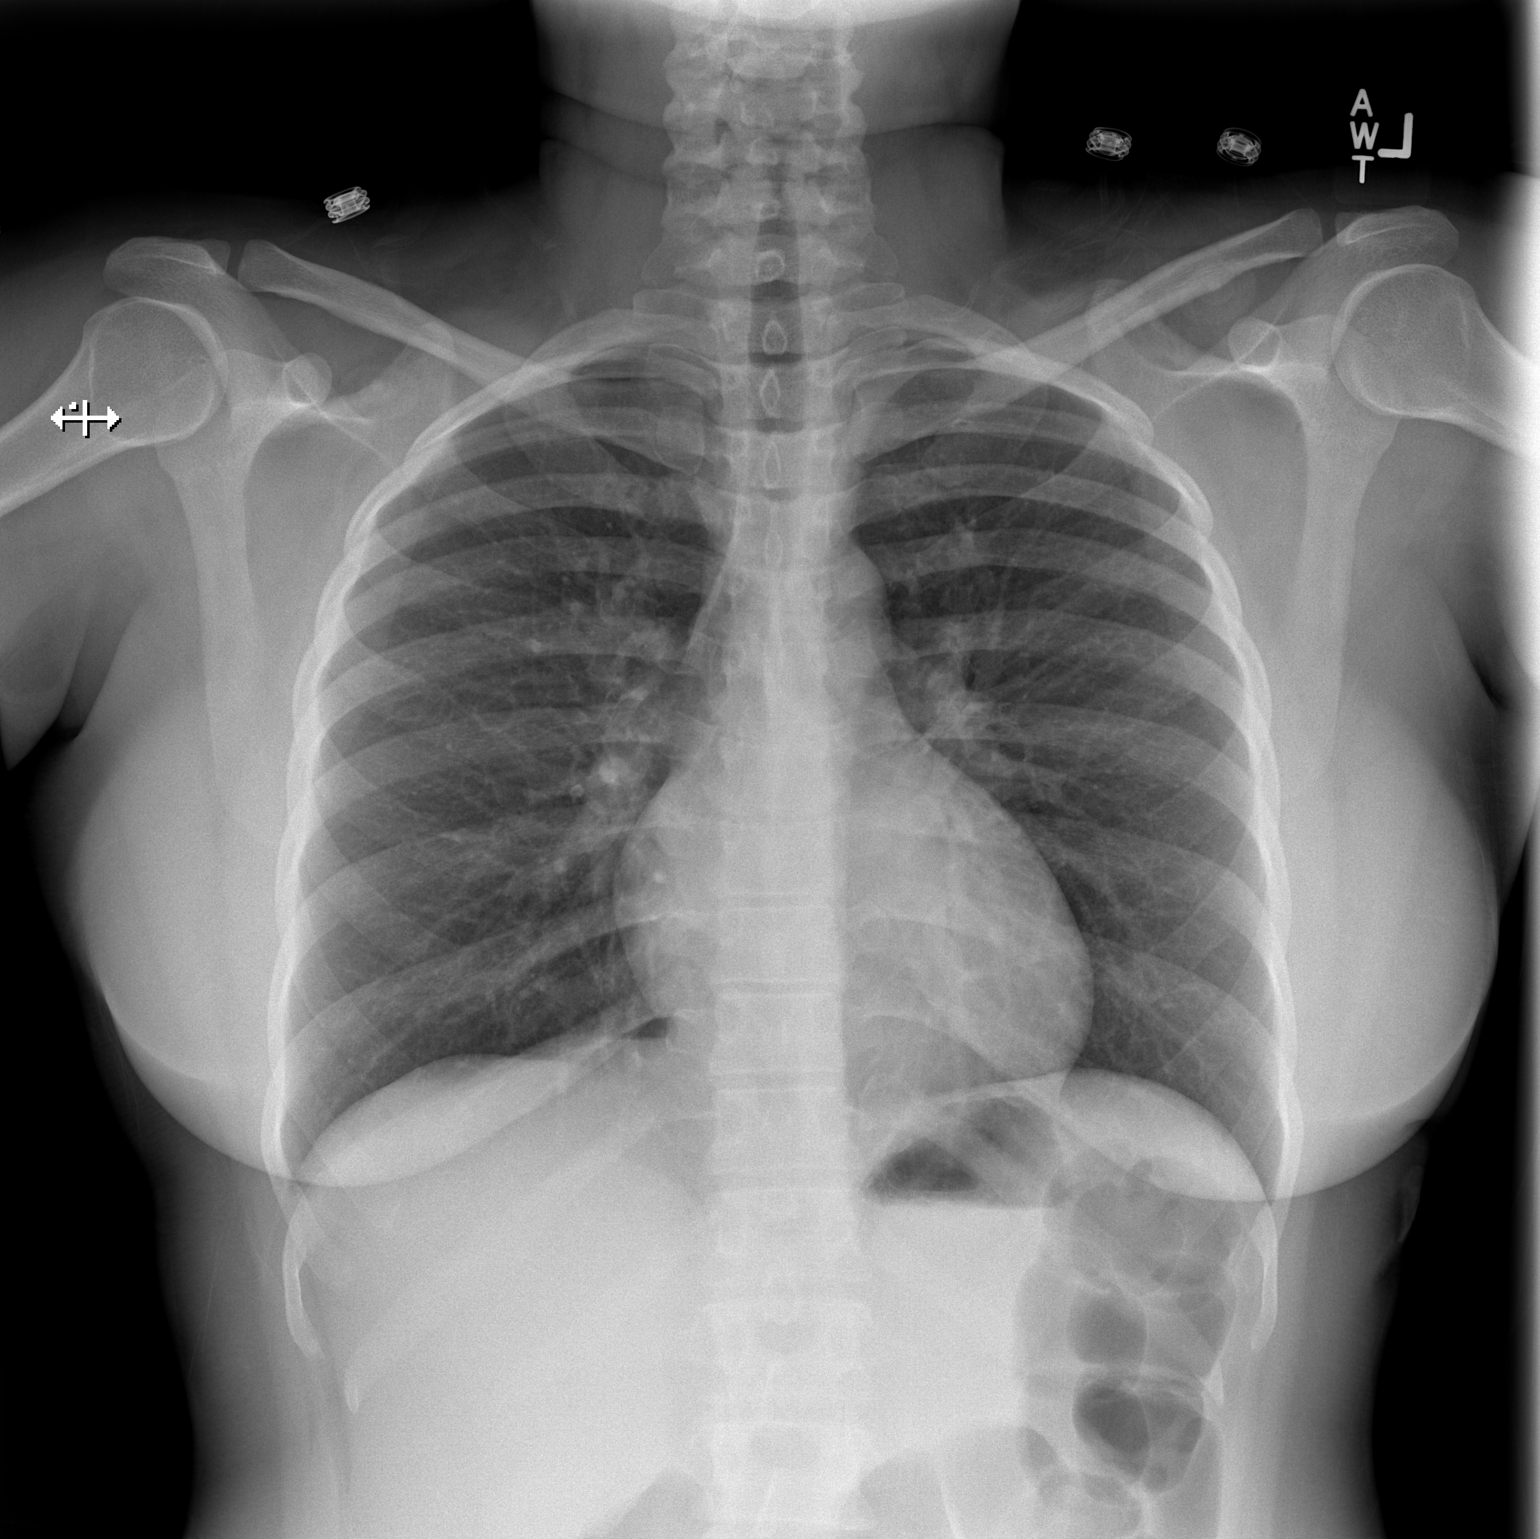

[w chest lat]
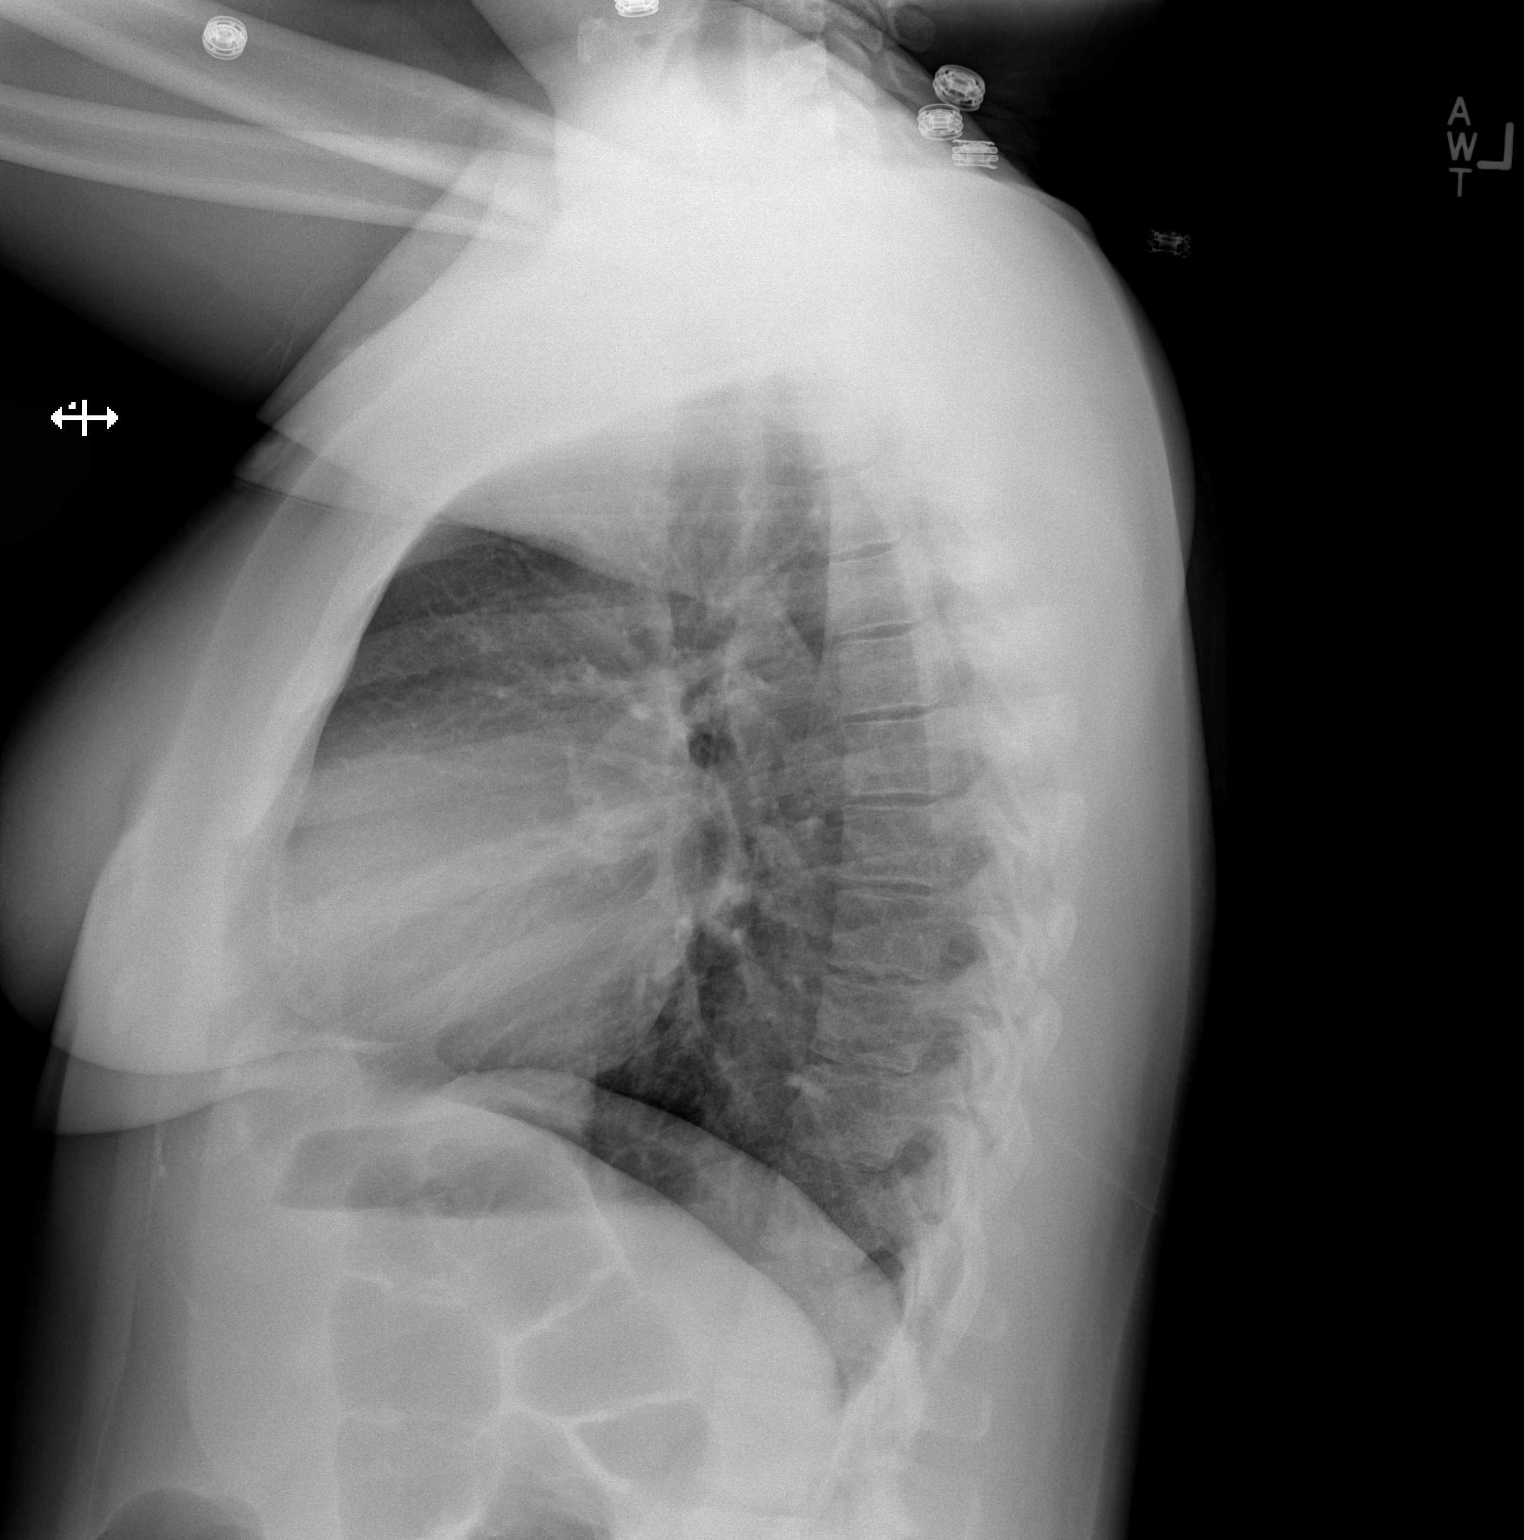

[2 of 2 positions shown; findings below may reference images not displayed]

FINDINGS: No active infiltrate or effusion is seen. Mediastinal and hilar
contours are unremarkable. The heart is within upper limits of
normal for age. No bony abnormality is seen.
IMPRESSION: No active cardiopulmonary disease.

## 2017-04-14 ENCOUNTER — Inpatient Hospital Stay (HOSPITAL_COMMUNITY): Payer: 59

## 2017-04-14 ENCOUNTER — Inpatient Hospital Stay (HOSPITAL_COMMUNITY)
Admission: AD | Admit: 2017-04-14 | Discharge: 2017-04-14 | Disposition: A | Discharge status: Home / Self Care | Payer: 59 | Source: Ambulatory Visit | Attending: Family Medicine | Admitting: Family Medicine

## 2017-04-14 ENCOUNTER — Encounter (HOSPITAL_COMMUNITY): Payer: Self-pay | Admitting: *Deleted

## 2017-04-14 DIAGNOSIS — O219 Vomiting of pregnancy, unspecified: Secondary | ICD-10-CM

## 2017-04-14 DIAGNOSIS — R109 Unspecified abdominal pain: Secondary | ICD-10-CM | POA: Insufficient documentation

## 2017-04-14 DIAGNOSIS — D649 Anemia, unspecified: Secondary | ICD-10-CM | POA: Diagnosis not present

## 2017-04-14 DIAGNOSIS — Z3491 Encounter for supervision of normal pregnancy, unspecified, first trimester: Secondary | ICD-10-CM

## 2017-04-14 DIAGNOSIS — Z3A01 Less than 8 weeks gestation of pregnancy: Secondary | ICD-10-CM | POA: Diagnosis not present

## 2017-04-14 DIAGNOSIS — O99011 Anemia complicating pregnancy, first trimester: Secondary | ICD-10-CM | POA: Diagnosis not present

## 2017-04-14 DIAGNOSIS — O26891 Other specified pregnancy related conditions, first trimester: Secondary | ICD-10-CM | POA: Insufficient documentation

## 2017-04-14 DIAGNOSIS — R42 Dizziness and giddiness: Secondary | ICD-10-CM | POA: Diagnosis present

## 2017-04-14 DIAGNOSIS — O210 Mild hyperemesis gravidarum: Secondary | ICD-10-CM | POA: Diagnosis not present

## 2017-04-14 LAB — URINALYSIS, ROUTINE W REFLEX MICROSCOPIC
BACTERIA UA: NONE SEEN
Bilirubin Urine: NEGATIVE
GLUCOSE, UA: NEGATIVE mg/dL
Ketones, ur: NEGATIVE mg/dL
Leukocytes, UA: NEGATIVE
NITRITE: NEGATIVE
PROTEIN: NEGATIVE mg/dL
SPECIFIC GRAVITY, URINE: 1.02 (ref 1.005–1.030)
pH: 6 (ref 5.0–8.0)

## 2017-04-14 LAB — HCG, QUANTITATIVE, PREGNANCY: HCG, BETA CHAIN, QUANT, S: 5251 m[IU]/mL — AB (ref <5)

## 2017-04-14 LAB — CBC
HCT: 31.4 % — ABNORMAL LOW (ref 36.0–46.0)
Hemoglobin: 10.3 g/dL — ABNORMAL LOW (ref 12.0–15.0)
MCH: 27.6 pg (ref 26.0–34.0)
MCHC: 32.8 g/dL (ref 30.0–36.0)
MCV: 84.2 fL (ref 78.0–100.0)
Platelets: 338 10*3/uL (ref 150–400)
RBC: 3.73 MIL/uL — AB (ref 3.87–5.11)
RDW: 14.6 % (ref 11.5–15.5)
WBC: 7.3 10*3/uL (ref 4.0–10.5)

## 2017-04-14 LAB — POCT PREGNANCY, URINE: Preg Test, Ur: POSITIVE — AB

## 2017-04-14 MED ORDER — LACTATED RINGERS IV BOLUS (SEPSIS)
1000.0000 mL | Freq: Once | INTRAVENOUS | Status: AC
  Administered 2017-04-14: 1000 mL via INTRAVENOUS

## 2017-04-14 MED ORDER — PROMETHAZINE HCL 25 MG PO TABS: 12.5000 mg | ORAL_TABLET | Freq: Every evening | ORAL | 2 refills | Status: DC | PRN

## 2017-04-14 MED ORDER — METOCLOPRAMIDE HCL 10 MG PO TABS: 10.0000 mg | ORAL_TABLET | Freq: Three times a day (TID) | ORAL | 2 refills | Status: DC

## 2017-04-14 MED ORDER — METOCLOPRAMIDE HCL 5 MG/ML IJ SOLN
10.0000 mg | Freq: Once | INTRAMUSCULAR | Status: AC
  Administered 2017-04-14: 10 mg via INTRAVENOUS
  Filled 2017-04-14: qty 2

## 2017-04-14 MED ORDER — CONCEPT OB 130-92.4-1 MG PO CAPS: 1.0000 | ORAL_CAPSULE | Freq: Every day | ORAL | 11 refills | Status: DC

## 2017-04-14 NOTE — Discharge Instructions (Signed)
[facility]  ° ° °[facility]       Phone: [phone] ° °[facility] Phone: [phone] ° °[facility]  Phone: [phone] ° °[facility] [place]  Phone: [phone] ° °[facility]  Phone: [phone] ° °[facility]       Phone: [phone] ° °[facility]    Phone: [phone]  ° °[facility] ([place])    Phone: [phone] ° °[facility]    Phone: [phone] ° °[facility]    Phone: [phone] ° °[facility]    Phone: [phone] ° °[facility]       Phone: [phone]  ° °[facility]  Phone: [phone] ° °[facility]    Phone: [phone] ° °[facility]   Phone: [phone] ° °[facility]      Phone: [phone] ° °[facility]    Phone: [phone] ° °

## 2017-04-14 NOTE — MAU Provider Note (Signed)
Chief Complaint: Dizziness; Nausea; and Fatigue   First Provider Initiated Contact with Patient 04/14/17 1729      SUBJECTIVE HPI: Sonia Allen is a 31 y.o. [id number] at Unknown by LMP who presents to maternity admissions reporting dizziness, nausea, fatigue, and back pain x 1 week.  She was seen in urgent care 4 days ago for her symptoms and had a positive pregnancy test. Because of her pain, she was told to follow up at [facility] to rule out ectopic pregnancy. Her back pain is intermittent cramping pain and does radiate to her low abdomen occasionally.  She reports she is not eating much because of the nausea. She denies vomiting, just constant nausea and weakness.  She has not tried any treatments. Nothing makes her symptoms better or worse. She had STD testing at urgent care 4 days ago with normal results. She denies vaginal bleeding, vaginal itching/burning, urinary symptoms, h/a, or fever/chills.     HPI  Past Medical History:  Diagnosis Date  . Anemia    first and sec pregnancies  . Arrhythmia   . Chlamydia   . Fibroid   . Gestational diabetes    diet controlled  . Gonorrhea   . Headache(784.0)   . Hemorrhoids   . Hx of migraine headaches   . Leiomyoma of uterus in pregnancy    Past Surgical History:  Procedure Laterality Date  . INDUCED ABORTION  March  2 ,2013  . WISDOM TOOTH EXTRACTION     Social History   Social History  . Marital status: Single    Spouse name: N/A  . Number of children: N/A  . Years of education: N/A   Occupational History  . Not on file.   Social History Main Topics  . Smoking status: Never Smoker  . Smokeless tobacco: Never Used  . Alcohol use No  . Drug use: Yes    Frequency: 14.0 times per week    Types: Marijuana  . Sexual activity: Yes    Birth control/ protection: None   Other Topics Concern  . Not on file   Social History Narrative  . No narrative on file   No current facility-administered medications on file prior to encounter.     No current outpatient prescriptions on file prior to encounter.   No Known Allergies  ROS:  Review of Systems  Constitutional: Positive for appetite change and fatigue. Negative for chills and fever.  Respiratory: Negative for shortness of breath.   Cardiovascular: Negative for chest pain.  Gastrointestinal: Positive for abdominal pain and nausea. Negative for vomiting.  Genitourinary: Negative for difficulty urinating, dysuria, flank pain, pelvic pain, vaginal bleeding, vaginal discharge and vaginal pain.  Musculoskeletal: Positive for back pain.  Neurological: Positive for dizziness and weakness. Negative for headaches.  Psychiatric/Behavioral: Negative.      I have reviewed patient's Past Medical Hx, Surgical Hx, Family Hx, Social Hx, medications and allergies.   Physical Exam   Patient Vitals for the past 24 hrs:  BP Temp Temp src Pulse Resp SpO2 Weight  04/14/17 1635 121/78 98.6 F (37 C) Oral 76 17 100 % 180 lb 1.9 oz (81.7 kg)   Constitutional: Well-developed, well-nourished female in no acute distress.  Cardiovascular: normal rate Respiratory: normal effort GI: Abd soft, non-tender. Pos BS x 4 MS: Extremities nontender, no edema, normal ROM Neurologic: Alert and oriented x 4.  GU: Neg CVAT.  PELVIC EXAM: Deferred  LAB RESULTS Results for orders placed or performed during the hospital encounter of 04/14/17 (  from the past 24 hour(s))  Urinalysis, Routine w reflex microscopic     Status: Abnormal   Collection Time: 04/14/17  4:37 PM  Result Value Ref Range   Color, Urine YELLOW YELLOW   APPearance HAZY (A) CLEAR   Specific Gravity, Urine 1.020 1.005 - 1.030   pH 6.0 5.0 - 8.0   Glucose, UA NEGATIVE NEGATIVE mg/dL   Hgb urine dipstick SMALL (A) NEGATIVE   Bilirubin Urine NEGATIVE NEGATIVE   Ketones, ur NEGATIVE NEGATIVE mg/dL   Protein, ur NEGATIVE NEGATIVE mg/dL   Nitrite NEGATIVE NEGATIVE   Leukocytes, UA NEGATIVE NEGATIVE   RBC / HPF 0-5 0 - 5 RBC/hpf    WBC, UA 0-5 0 - 5 WBC/hpf   Bacteria, UA NONE SEEN NONE SEEN   Squamous Epithelial / LPF 0-5 (A) NONE SEEN   Mucus PRESENT    Amorphous Crystal PRESENT   Pregnancy, urine POC     Status: Abnormal   Collection Time: 04/14/17  4:47 PM  Result Value Ref Range   Preg Test, Ur POSITIVE (A) NEGATIVE  CBC     Status: Abnormal   Collection Time: 04/14/17  6:15 PM  Result Value Ref Range   WBC 7.3 4.0 - 10.5 K/uL   RBC 3.73 (L) 3.87 - 5.11 MIL/uL   Hemoglobin 10.3 (L) 12.0 - 15.0 g/dL   HCT 31.4 (L) 36.0 - 46.0 %   MCV 84.2 78.0 - 100.0 fL   MCH 27.6 26.0 - 34.0 pg   MCHC 32.8 30.0 - 36.0 g/dL   RDW 14.6 11.5 - 15.5 %   Platelets 338 150 - 400 K/uL  hCG, quantitative, pregnancy     Status: Abnormal   Collection Time: 04/14/17  6:15 PM  Result Value Ref Range   hCG, Beta Chain, Quant, S 5,251 (H) <5 mIU/mL       IMAGING US Ob Comp Less 14 Wks  Result Date: 04/14/2017 CLINICAL DATA:  Unknown dates, history of fibroids positive urine pregnancy test EXAM: OBSTETRIC <14 WK [place] AND TRANSVAGINAL OB US TECHNIQUE: Both transabdominal and transvaginal ultrasound examinations were performed for complete evaluation of the gestation as well as the maternal uterus, adnexal regions, and pelvic cul-de-sac. Transvaginal technique was performed to assess early pregnancy. COMPARISON:  None. FINDINGS: Intrauterine gestational sac: Single intrauterine gestational sac. Yolk sac:  Tiny visible yolk sac. Embryo:  Not seen Cardiac Activity: Not seen MSD: 5.1  mm   5 w   2  d Subchorionic hemorrhage:  None visualized. Maternal uterus/adnexae: Rim calcified right myometrial mass measuring 4.1 x 3.6 x 3.7 cm consistent with uterine fibroid. Bilateral ovaries are within normal limits. The right ovary measures 2.5 x 1.4 x 1.3 cm and the left ovary measures 2.1 by 3.4 x 2.5 cm. IMPRESSION: Single intrauterine gestational sac with tiny yolk sac. Consider follow-up ultrasound in 10-14 days to confirm viability. 4.1 cm right  myometrial mass, consistent with fibroid. Electronically Signed   By: Donavan Foil M.D.   On: 04/14/2017 20:11   US Ob Transvaginal  Result Date: 04/14/2017 CLINICAL DATA:  Unknown dates, history of fibroids positive urine pregnancy test EXAM: OBSTETRIC <14 WK [place] AND TRANSVAGINAL OB US TECHNIQUE: Both transabdominal and transvaginal ultrasound examinations were performed for complete evaluation of the gestation as well as the maternal uterus, adnexal regions, and pelvic cul-de-sac. Transvaginal technique was performed to assess early pregnancy. COMPARISON:  None. FINDINGS: Intrauterine gestational sac: Single intrauterine gestational sac. Yolk sac:  Tiny visible yolk sac. Embryo:  Not seen Cardiac Activity: Not seen MSD: 5.1  mm   5 w   2  d Subchorionic hemorrhage:  None visualized. Maternal uterus/adnexae: Rim calcified right myometrial mass measuring 4.1 x 3.6 x 3.7 cm consistent with uterine fibroid. Bilateral ovaries are within normal limits. The right ovary measures 2.5 x 1.4 x 1.3 cm and the left ovary measures 2.1 by 3.4 x 2.5 cm. IMPRESSION: Single intrauterine gestational sac with tiny yolk sac. Consider follow-up ultrasound in 10-14 days to confirm viability. 4.1 cm right myometrial mass, consistent with fibroid. Electronically Signed   By: Donavan Foil M.D.   On: 04/14/2017 20:11    MAU Management/MDM: Ordered labs and reviewed results.  LR x 1000 ml and Reglan 10 mg IV given. Pt reports improved symptoms.  IUP noted on today's [place]. Will treat for mild anemia with Rx for PNV.  Rx for Reglan TID with meals and Phenergan Q HS for nausea. Pt to f/u with early prenatal care. List of providers given. Pt stable at time of discharge.  ASSESSMENT 1. Normal IUP (intrauterine pregnancy) on prenatal ultrasound, first trimester   2. Abdominal pain during pregnancy in first trimester   3. Nausea and vomiting during pregnancy prior to [redacted] weeks gestation   4. Anemia affecting pregnancy in first  trimester     PLAN Discharge home Allergies as of 04/14/2017   No Known Allergies     Medication List    TAKE these medications   CONCEPT OB 130-92.4-1 MG Caps Take 1 capsule by mouth daily.   metoCLOPramide 10 MG tablet Commonly known as:  REGLAN Take 1 tablet (10 mg total) by mouth 3 (three) times daily before meals.   promethazine 25 MG tablet Commonly known as:  PHENERGAN Take 0.5-1 tablets (12.5-25 mg total) by mouth at bedtime as needed.      Follow-up Information    Department, [facility] Follow up.   Why:  Or prenatal provider of your choice, see list provided. Return to MAU as needed for emergencies. Contact information: [place] [address] [place] Certified Nurse-Midwife 04/14/2017  9:33 PM

## 2017-04-14 NOTE — MAU Note (Signed)
+  dizziness +weakness +nausea  Started over the weekend  States had a miscarriage in may  LMP unsure; irregular; reports some spotting in September Went to urgent care on Thursday and was told she was pregnant and [facility] baby was in the right place patient states.

## 2017-04-15 LAB — HIV ANTIBODY (ROUTINE TESTING W REFLEX): HIV Screen 4th Generation wRfx: NONREACTIVE

## 2017-05-06 ENCOUNTER — Inpatient Hospital Stay (HOSPITAL_COMMUNITY)
Admission: AD | Admit: 2017-05-06 | Discharge: 2017-05-06 | Disposition: A | Discharge status: Home / Self Care | Payer: 59 | Source: Ambulatory Visit | Attending: Obstetrics & Gynecology | Admitting: Obstetrics & Gynecology

## 2017-05-06 ENCOUNTER — Encounter (HOSPITAL_COMMUNITY): Payer: Self-pay | Admitting: *Deleted

## 2017-05-06 DIAGNOSIS — K5900 Constipation, unspecified: Secondary | ICD-10-CM | POA: Insufficient documentation

## 2017-05-06 DIAGNOSIS — Z3A08 8 weeks gestation of pregnancy: Secondary | ICD-10-CM | POA: Diagnosis not present

## 2017-05-06 DIAGNOSIS — Z79899 Other long term (current) drug therapy: Secondary | ICD-10-CM | POA: Diagnosis not present

## 2017-05-06 DIAGNOSIS — O99612 Diseases of the digestive system complicating pregnancy, second trimester: Secondary | ICD-10-CM | POA: Diagnosis not present

## 2017-05-06 DIAGNOSIS — O210 Mild hyperemesis gravidarum: Secondary | ICD-10-CM

## 2017-05-06 DIAGNOSIS — R110 Nausea: Secondary | ICD-10-CM | POA: Diagnosis present

## 2017-05-06 DIAGNOSIS — O219 Vomiting of pregnancy, unspecified: Secondary | ICD-10-CM | POA: Diagnosis not present

## 2017-05-06 LAB — URINALYSIS, ROUTINE W REFLEX MICROSCOPIC
Bilirubin Urine: NEGATIVE
Glucose, UA: NEGATIVE mg/dL
Hgb urine dipstick: NEGATIVE
Ketones, ur: 5 mg/dL — AB
Leukocytes, UA: NEGATIVE
Nitrite: NEGATIVE
Protein, ur: NEGATIVE mg/dL
Specific Gravity, Urine: 1.026 (ref 1.005–1.030)
pH: 5 (ref 5.0–8.0)

## 2017-05-06 MED ORDER — DOCUSATE SODIUM 50 MG PO CAPS: 50.0000 mg | ORAL_CAPSULE | Freq: Two times a day (BID) | ORAL | 1 refills | Status: DC | PRN

## 2017-05-06 NOTE — MAU Note (Signed)
Pt reports she has been nauseated and has not had an appetite for 2 days. No vomiting, no fever.

## 2017-05-06 NOTE — Discharge Instructions (Signed)
Morning Sickness °Morning sickness is when you feel sick to your stomach (nauseous) during pregnancy. This nauseous feeling may or may not come with vomiting. It often occurs in the morning but can be a problem any time of day. Morning sickness is most common during the first trimester, but it may continue throughout pregnancy. While morning sickness is unpleasant, it is usually harmless unless you develop severe and continual vomiting (hyperemesis gravidarum). This condition requires more intense treatment. °What are the causes? °The cause of morning sickness is not completely known but seems to be related to normal hormonal changes that occur in pregnancy. °What increases the risk? °You are at greater risk if you: °· Experienced nausea or vomiting before your pregnancy. °· Had morning sickness during a previous pregnancy. °· Are pregnant with more than one baby, such as twins. ° °How is this treated? °Do not use any medicines (prescription, over-the-counter, or herbal) for morning sickness without first talking to your health care provider. Your health care provider may prescribe or recommend: °· Vitamin B6 supplements. °· Anti-nausea medicines. °· The herbal medicine ginger. ° °Follow these instructions at home: °· Only take over-the-counter or prescription medicines as directed by your health care provider. °· Taking multivitamins before getting pregnant can prevent or decrease the severity of morning sickness in most women. °· Eat a piece of dry toast or unsalted crackers before getting out of bed in the morning. °· Eat five or six small meals a day. °· Eat dry and bland foods (rice, baked potato). Foods high in carbohydrates are often helpful. °· Do not drink liquids with your meals. Drink liquids between meals. °· Avoid greasy, fatty, and spicy foods. °· Get someone to cook for you if the smell of any food causes nausea and vomiting. °· If you feel nauseous after taking prenatal vitamins, take the vitamins at  night or with a snack. °· Snack on protein foods (nuts, yogurt, cheese) between meals if you are hungry. °· Eat unsweetened gelatins for desserts. °· Wearing an acupressure wristband (worn for sea sickness) may be helpful. °· Acupuncture may be helpful. °· Do not smoke. °· Get a humidifier to keep the air in your house free of odors. °· Get plenty of fresh air. °Contact a health care provider if: °· Your home remedies are not working, and you need medicine. °· You feel dizzy or lightheaded. °· You are losing weight. °Get help right away if: °· You have persistent and uncontrolled nausea and vomiting. °· You pass out (faint). °This information is not intended to replace advice given to you by your health care provider. Make sure you discuss any questions you have with your health care provider. °Document Released: 08/08/2006 Document Revised: 11/23/2015 Document Reviewed: 12/02/2012 °Elsevier Interactive Patient Education © 2017 [employer] Inc. ° °

## 2017-05-06 NOTE — MAU Provider Note (Signed)
Chief Complaint: Nausea   SUBJECTIVE  HPI: Sonia Allen is a 31 y.o. [id number] at [redacted]w[redacted]d who presents to MAU complaining of nausea.  Patient states that she has had no appetite for the last 2 days.  Patient states that she has had morning sickness with prior pregnancies but this seems to be worse.  She feels like she is losing weight.  Unable to eat full meals.  Denies any vomiting.  Has prescription for antiemetics but has been taking them intermittently.  When she does take them they do work.  Patient denies any vaginal discharge, vaginal bleeding, abdominal pain.  Denies any fevers or recent illness.   Past Medical History:  Diagnosis Date  . Anemia    first and sec pregnancies  . Arrhythmia   . Chlamydia   . Fibroid   . Gestational diabetes    diet controlled  . Gonorrhea   . Headache(784.0)   . Hemorrhoids   . Hx of migraine headaches   . Leiomyoma of uterus in pregnancy    OB History  Gravida Para Term Preterm AB Living  8 4 4  0 2 4  SAB TAB Ectopic Multiple Live Births  1 1 0 0 4    # Outcome Date GA Lbr Len/2nd Weight Sex Delivery Anes PTL Lv  8 Current           7 Term 03/12/14 [redacted]w[redacted]d 06:41 / 00:16 3.465 kg (7 lb 10.2 oz) F Vag-Spont EPI  LIV  6 Term 10/14/12 [redacted]w[redacted]d 03:40 / 00:13 3.334 kg (7 lb 5.6 oz) M Vag-Spont EPI  LIV     Birth Comments: +GBS  5 TAB 2013 [redacted]w[redacted]d            Birth Comments: System Generated. Please review and update pregnancy details.  4 Term 08/28/10 [redacted]w[redacted]d 07:00 3.997 kg (8 lb 13 oz) M Vag-Spont EPI  LIV     Birth Comments: leiomyomatous uterus  3 Term 01/14/08 [redacted]w[redacted]d 06:00 2.778 kg (6 lb 2 oz) M Vag-Spont EPI  LIV  2 Gravida           1 SAB              Past Surgical History:  Procedure Laterality Date  . INDUCED ABORTION  March  2 ,2013  . WISDOM TOOTH EXTRACTION     Social History   Socioeconomic History  . Marital status: Single    Spouse name: Not on file  . Number of children: Not on file  . Years of education: Not on file  . Highest  education level: Not on file  Social Needs  . Financial resource strain: Not on file  . Food insecurity - worry: Not on file  . Food insecurity - inability: Not on file  . Transportation needs - medical: Not on file  . Transportation needs - non-medical: Not on file  Occupational History  . Not on file  Tobacco Use  . Smoking status: Never Smoker  . Smokeless tobacco: Never Used  Substance and Sexual Activity  . Alcohol use: No  . Drug use: Yes    Frequency: 14.0 times per week    Types: Marijuana  . Sexual activity: Yes    Birth control/protection: None  Other Topics Concern  . Not on file  Social History Narrative  . Not on file   No current facility-administered medications on file prior to encounter.    Current Outpatient Medications on File Prior to Encounter  Medication Sig Dispense Refill  .  metoCLOPramide (REGLAN) 10 MG tablet Take 1 tablet (10 mg total) by mouth 3 (three) times daily before meals. 60 tablet 2  . Prenat w/o A Vit-FeFum-FePo-FA (CONCEPT OB) 130-92.4-1 MG CAPS Take 1 capsule by mouth daily. 30 capsule 11  . promethazine (PHENERGAN) 25 MG tablet Take 0.5-1 tablets (12.5-25 mg total) by mouth at bedtime as needed. 30 tablet 2   No Known Allergies  I have reviewed the past Medical Hx, Surgical Hx, Social Hx, Allergies and Medications.   REVIEW OF SYSTEMS All systems reviewed and are negative for acute change except as noted in the HPI.   OBJECTIVE BP 113/69 (BP Location: Right Arm)   Pulse 71   Temp 98.9 F (37.2 C) (Oral)   Resp 16   Ht 5\' 4"  (1.626 m)   Wt 80.3 kg (177 lb)   LMP 07/20/2016   SpO2 100%   BMI 30.38 kg/m    PHYSICAL EXAM Constitutional: Well-developed, well-nourished female in no acute distress.  O/P clear; MMM Cardiovascular: normal rate and rhythm, pulses intact Respiratory: normal rate and effort.  GI: Abd soft, non-tender, non-distended. MS: Extremities nontender, no edema, normal ROM Neurologic: Alert and oriented x  4. No focal deficits Psych: normal mood and affect  LAB RESULTS Results for orders placed or performed during the hospital encounter of 05/06/17 (from the past 24 hour(s))  Urinalysis, Routine w reflex microscopic     Status: Abnormal   Collection Time: 05/06/17  3:42 PM  Result Value Ref Range   Color, Urine YELLOW YELLOW   APPearance HAZY (A) CLEAR   Specific Gravity, Urine 1.026 1.005 - 1.030   pH 5.0 5.0 - 8.0   Glucose, UA NEGATIVE NEGATIVE mg/dL   Hgb urine dipstick NEGATIVE NEGATIVE   Bilirubin Urine NEGATIVE NEGATIVE   Ketones, ur 5 (A) NEGATIVE mg/dL   Protein, ur NEGATIVE NEGATIVE mg/dL   Nitrite NEGATIVE NEGATIVE   Leukocytes, UA NEGATIVE NEGATIVE    IMAGING US Ob Comp Less 14 Wks  Result Date: 04/14/2017 CLINICAL DATA:  Unknown dates, history of fibroids positive urine pregnancy test EXAM: OBSTETRIC <14 WK [place] AND TRANSVAGINAL OB US TECHNIQUE: Both transabdominal and transvaginal ultrasound examinations were performed for complete evaluation of the gestation as well as the maternal uterus, adnexal regions, and pelvic cul-de-sac. Transvaginal technique was performed to assess early pregnancy. COMPARISON:  None. FINDINGS: Intrauterine gestational sac: Single intrauterine gestational sac. Yolk sac:  Tiny visible yolk sac. Embryo:  Not seen Cardiac Activity: Not seen MSD: 5.1  mm   5 w   2  d Subchorionic hemorrhage:  None visualized. Maternal uterus/adnexae: Rim calcified right myometrial mass measuring 4.1 x 3.6 x 3.7 cm consistent with uterine fibroid. Bilateral ovaries are within normal limits. The right ovary measures 2.5 x 1.4 x 1.3 cm and the left ovary measures 2.1 by 3.4 x 2.5 cm. IMPRESSION: Single intrauterine gestational sac with tiny yolk sac. Consider follow-up ultrasound in 10-14 days to confirm viability. 4.1 cm right myometrial mass, consistent with fibroid. Electronically Signed   By: Donavan Foil M.D.   On: 04/14/2017 20:11   US Ob Transvaginal  Result Date:  04/14/2017 CLINICAL DATA:  Unknown dates, history of fibroids positive urine pregnancy test EXAM: OBSTETRIC <14 WK [place] AND TRANSVAGINAL OB US TECHNIQUE: Both transabdominal and transvaginal ultrasound examinations were performed for complete evaluation of the gestation as well as the maternal uterus, adnexal regions, and pelvic cul-de-sac. Transvaginal technique was performed to assess early pregnancy. COMPARISON:  None. FINDINGS: Intrauterine  gestational sac: Single intrauterine gestational sac. Yolk sac:  Tiny visible yolk sac. Embryo:  Not seen Cardiac Activity: Not seen MSD: 5.1  mm   5 w   2  d Subchorionic hemorrhage:  None visualized. Maternal uterus/adnexae: Rim calcified right myometrial mass measuring 4.1 x 3.6 x 3.7 cm consistent with uterine fibroid. Bilateral ovaries are within normal limits. The right ovary measures 2.5 x 1.4 x 1.3 cm and the left ovary measures 2.1 by 3.4 x 2.5 cm. IMPRESSION: Single intrauterine gestational sac with tiny yolk sac. Consider follow-up ultrasound in 10-14 days to confirm viability. 4.1 cm right myometrial mass, consistent with fibroid. Electronically Signed   By: Donavan Foil M.D.   On: 04/14/2017 20:11    MAU COURSE Vitals and nursing notes reviewed Urine with signs of dehydration and ketones  MDM Plan of care reviewed with patient, including labs and tests ordered and medical treatment.   ASSESSMENT 1. Morning sickness   2. Nausea     PLAN Discharge home in stable condition. Reassurance given Discussed diet for morning sickness Reinforced proper use of antiemetics Rx given for constipation OB provider list given Counseled on return precautions Handout given   Luiz Blare, DO [facility] [place] 05/06/2017, 3:57 PM

## 2017-05-09 ENCOUNTER — Encounter (HOSPITAL_COMMUNITY): Payer: Self-pay

## 2017-05-09 ENCOUNTER — Inpatient Hospital Stay (HOSPITAL_COMMUNITY)
Admission: AD | Admit: 2017-05-09 | Discharge: 2017-05-09 | Disposition: A | Discharge status: Home / Self Care | Payer: 59 | Source: Ambulatory Visit | Attending: Obstetrics & Gynecology | Admitting: Obstetrics & Gynecology

## 2017-05-09 DIAGNOSIS — Z3A08 8 weeks gestation of pregnancy: Secondary | ICD-10-CM

## 2017-05-09 DIAGNOSIS — O26891 Other specified pregnancy related conditions, first trimester: Secondary | ICD-10-CM | POA: Diagnosis not present

## 2017-05-09 DIAGNOSIS — O26851 Spotting complicating pregnancy, first trimester: Secondary | ICD-10-CM | POA: Insufficient documentation

## 2017-05-09 DIAGNOSIS — R0789 Other chest pain: Secondary | ICD-10-CM | POA: Insufficient documentation

## 2017-05-09 DIAGNOSIS — Z3A01 Less than 8 weeks gestation of pregnancy: Secondary | ICD-10-CM | POA: Diagnosis not present

## 2017-05-09 DIAGNOSIS — O9989 Other specified diseases and conditions complicating pregnancy, childbirth and the puerperium: Secondary | ICD-10-CM | POA: Diagnosis not present

## 2017-05-09 DIAGNOSIS — Z3A09 9 weeks gestation of pregnancy: Secondary | ICD-10-CM | POA: Insufficient documentation

## 2017-05-09 LAB — URINALYSIS, ROUTINE W REFLEX MICROSCOPIC
BACTERIA UA: NONE SEEN
Bilirubin Urine: NEGATIVE
Glucose, UA: NEGATIVE mg/dL
KETONES UR: NEGATIVE mg/dL
Leukocytes, UA: NEGATIVE
Nitrite: NEGATIVE
PROTEIN: NEGATIVE mg/dL
Specific Gravity, Urine: 1.028 (ref 1.005–1.030)
pH: 6 (ref 5.0–8.0)

## 2017-05-09 LAB — COMPREHENSIVE METABOLIC PANEL
ALT: 11 U/L — AB (ref 14–54)
AST: 13 U/L — AB (ref 15–41)
Albumin: 4.2 g/dL (ref 3.5–5.0)
Alkaline Phosphatase: 50 U/L (ref 38–126)
Anion gap: 8 (ref 5–15)
BUN: 16 mg/dL (ref 6–20)
CHLORIDE: 103 mmol/L (ref 101–111)
CO2: 22 mmol/L (ref 22–32)
CREATININE: 0.64 mg/dL (ref 0.44–1.00)
Calcium: 9 mg/dL (ref 8.9–10.3)
GFR calc Af Amer: >60 mL/min (ref >60)
Glucose, Bld: 92 mg/dL (ref 65–99)
Potassium: 3.8 mmol/L (ref 3.5–5.1)
SODIUM: 133 mmol/L — AB (ref 135–145)
Total Bilirubin: 0.5 mg/dL (ref 0.3–1.2)
Total Protein: 7.7 g/dL (ref 6.5–8.1)

## 2017-05-09 LAB — CBC
HCT: 29.4 % — ABNORMAL LOW (ref 36.0–46.0)
Hemoglobin: 10.3 g/dL — ABNORMAL LOW (ref 12.0–15.0)
MCH: 28.8 pg (ref 26.0–34.0)
MCHC: 35 g/dL (ref 30.0–36.0)
MCV: 82.1 fL (ref 78.0–100.0)
PLATELETS: 281 10*3/uL (ref 150–400)
RBC: 3.58 MIL/uL — AB (ref 3.87–5.11)
RDW: 14.2 % (ref 11.5–15.5)
WBC: 6.9 10*3/uL (ref 4.0–10.5)

## 2017-05-09 MED ORDER — CYCLOBENZAPRINE HCL 5 MG PO TABS: 5.0000 mg | ORAL_TABLET | Freq: Three times a day (TID) | ORAL | 0 refills | Status: DC | PRN

## 2017-05-09 NOTE — MAU Note (Signed)
Pt reports her chest has been hurting all day today, it is hard for her to talk when she is having the pain. Also reports she has noticed some spotting today,.

## 2017-05-09 NOTE — MAU Provider Note (Signed)
History     CSN: [id number]  Arrival date and time: 05/09/17 1540   First Provider Initiated Contact with Patient 05/09/17 1635      Chief Complaint  Patient presents with  . Vaginal Bleeding  . Chest Pain   HPI Sonia Allen is a 31 y.o. [id number] at [redacted]w[redacted]d who presents with chest pain & vaginal spotting.  Reports chest pain that started this morning at work. Pain is intermittent & occurs about every 30 minutes while she's lifting things at work. Points to the center of her chest. Rates pain 7/10 when it occurs. Has not treated pain. Reports no pain at this time. Pain does not radiate. Denies headache, dizziness, cough, sore throat, heartburn, SOB, or palpitations.  Reports pink spotting x 1 episode this morning. Has not continued. Denies abdominal pain, vaginal discharge, or dysuria. No recent intercourse.  OB History    Gravida Para Term Preterm AB Living   8 4 4  0 2 4   SAB TAB Ectopic Multiple Live Births   1 1 0 0 4      Past Medical History:  Diagnosis Date  . Anemia    first and sec pregnancies  . Arrhythmia   . Chlamydia   . Fibroid   . Gestational diabetes    diet controlled  . Gonorrhea   . Headache(784.0)   . Hemorrhoids   . Hx of migraine headaches   . Leiomyoma of uterus in pregnancy     Past Surgical History:  Procedure Laterality Date  . INDUCED ABORTION  March  2 ,2013  . WISDOM TOOTH EXTRACTION      Family History  Problem Relation Age of Onset  . Other Neg Hx   . Alcohol abuse Neg Hx   . Arthritis Neg Hx   . Asthma Neg Hx   . Birth defects Neg Hx   . Cancer Neg Hx   . COPD Neg Hx   . Depression Neg Hx   . Diabetes Neg Hx   . Drug abuse Neg Hx   . Early death Neg Hx   . Heart disease Neg Hx   . Hearing loss Neg Hx   . Hyperlipidemia Neg Hx   . Hypertension Neg Hx   . Kidney disease Neg Hx   . Learning disabilities Neg Hx   . Mental illness Neg Hx   . Mental retardation Neg Hx   . Miscarriages / Stillbirths Neg Hx   . Stroke Neg Hx    . Vision loss Neg Hx     Social History   Tobacco Use  . Smoking status: Never Smoker  . Smokeless tobacco: Never Used  Substance Use Topics  . Alcohol use: No  . Drug use: Yes    Frequency: 14.0 times per week    Types: Marijuana    Allergies: No Known Allergies  Medications Prior to Admission  Medication Sig Dispense Refill Last Dose  . Prenat w/o A Vit-FeFum-FePo-FA (CONCEPT OB) 130-92.4-1 MG CAPS Take 1 capsule by mouth daily. 30 capsule 11 05/08/2017 at Unknown time  . docusate sodium (COLACE) 50 MG capsule Take 1 capsule (50 mg total) 2 (two) times daily as needed by mouth for mild constipation. (Patient not taking: Reported on 05/09/2017) 30 capsule 1 Not Taking at Unknown time  . metoCLOPramide (REGLAN) 10 MG tablet Take 1 tablet (10 mg total) by mouth 3 (three) times daily before meals. 60 tablet 2 05/07/2017  . promethazine (PHENERGAN) 25 MG tablet Take 0.5-1 tablets (12.5-25  mg total) by mouth at bedtime as needed. 30 tablet 2 05/07/2017    Review of Systems  Constitutional: Negative.   Respiratory: Negative for cough and shortness of breath.   Cardiovascular: Positive for chest pain (none currently). Negative for palpitations and leg swelling.  Gastrointestinal: Negative.   Genitourinary: Positive for vaginal bleeding (none currently). Negative for dysuria and vaginal discharge.  Neurological: Negative.    Physical Exam   Blood pressure 123/74, pulse 75, temperature 98.5 F (36.9 C), temperature source Oral, resp. rate 18, height 5\' 4"  (1.626 m), weight 177 lb (80.3 kg), last menstrual period 07/20/2016, SpO2 100 %, unknown if currently breastfeeding.  Physical Exam  Nursing note and vitals reviewed. Constitutional: She is oriented to person, place, and time. She appears well-developed and well-nourished. No distress.  HENT:  Head: Normocephalic and atraumatic.  Eyes: Conjunctivae are normal. Right eye exhibits no discharge. Left eye exhibits no discharge. No  scleral icterus.  Neck: Normal range of motion.  Cardiovascular: Normal rate, regular rhythm, normal heart sounds and intact distal pulses.  No murmur heard. Respiratory: Effort normal and breath sounds normal. No respiratory distress. She has no wheezes. She exhibits tenderness (pain reproducable when palpating lower third of sternum & adjacent area to the left).  GI: Soft. She exhibits no distension. There is no tenderness. There is no rebound and no guarding.  Neurological: She is alert and oriented to person, place, and time.  Skin: Skin is warm and dry. She is not diaphoretic.  Psychiatric: She has a normal mood and affect. Her behavior is normal. Judgment and thought content normal.    MAU Course  Procedures Results for orders placed or performed during the hospital encounter of 05/09/17 (from the past 24 hour(s))  Urinalysis, Routine w reflex microscopic     Status: Abnormal   Collection Time: 05/09/17  3:54 PM  Result Value Ref Range   Color, Urine YELLOW YELLOW   APPearance HAZY (A) CLEAR   Specific Gravity, Urine 1.028 1.005 - 1.030   pH 6.0 5.0 - 8.0   Glucose, UA NEGATIVE NEGATIVE mg/dL   Hgb urine dipstick SMALL (A) NEGATIVE   Bilirubin Urine NEGATIVE NEGATIVE   Ketones, ur NEGATIVE NEGATIVE mg/dL   Protein, ur NEGATIVE NEGATIVE mg/dL   Nitrite NEGATIVE NEGATIVE   Leukocytes, UA NEGATIVE NEGATIVE   RBC / HPF 6-30 0 - 5 RBC/hpf   WBC, UA 0-5 0 - 5 WBC/hpf   Bacteria, UA NONE SEEN NONE SEEN   Squamous Epithelial / LPF 0-5 (A) NONE SEEN   Mucus PRESENT    Amorphous Crystal PRESENT   CBC     Status: Abnormal   Collection Time: 05/09/17  5:21 PM  Result Value Ref Range   WBC 6.9 4.0 - 10.5 K/uL   RBC 3.58 (L) 3.87 - 5.11 MIL/uL   Hemoglobin 10.3 (L) 12.0 - 15.0 g/dL   HCT 29.4 (L) 36.0 - 46.0 %   MCV 82.1 78.0 - 100.0 fL   MCH 28.8 26.0 - 34.0 pg   MCHC 35.0 30.0 - 36.0 g/dL   RDW 14.2 11.5 - 15.5 %   Platelets 281 150 - 400 K/uL  Comprehensive metabolic panel      Status: Abnormal   Collection Time: 05/09/17  5:21 PM  Result Value Ref Range   Sodium 133 (L) 135 - 145 mmol/L   Potassium 3.8 3.5 - 5.1 mmol/L   Chloride 103 101 - 111 mmol/L   CO2 22 22 - 32 mmol/L  Glucose, Bld 92 65 - 99 mg/dL   BUN 16 6 - 20 mg/dL   Creatinine, Ser 0.64 0.44 - 1.00 mg/dL   Calcium 9.0 8.9 - 10.3 mg/dL   Total Protein 7.7 6.5 - 8.1 g/dL   Albumin 4.2 3.5 - 5.0 g/dL   AST 13 (L) 15 - 41 U/L   ALT 11 (L) 14 - 54 U/L   Alkaline Phosphatase 50 38 - 126 U/L   Total Bilirubin 0.5 0.3 - 1.2 mg/dL   GFR calc non Af Amer >60 >60 mL/min   GFR calc Af Amer >60 >60 mL/min   Anion gap 8 5 - 15    MDM Informal bedside ultrasound shows IUP w/cardiac activity, 162 bpm VSS, NAD. Patient is completely asymptomatic.  EKG shows NSR Patient with recent hx of n/v in pregnancy; has improved & no vomiting since earlier this week. CMP collected to assess potassium which is normal.   Assessment and Plan  A:  1. Musculoskeletal chest pain   2. [redacted] weeks gestation of pregnancy    P; Discharge home Discussed reasons to return to MAU vs ED Start prenatal care  Jorje Guild 05/09/2017, 4:35 PM

## 2017-05-09 NOTE — Discharge Instructions (Signed)
Chest Wall Pain °Chest wall pain is pain in or around the bones and muscles of your chest. Sometimes, an injury causes this pain. Sometimes, the cause may not be known. This pain may take several weeks or longer to get better. °Follow these instructions at home: °Pay attention to any changes in your symptoms. Take these actions to help with your pain: °· Rest as told by your health care provider. °· Avoid activities that cause pain. These include any activities that use your chest muscles or your abdominal and side muscles to lift heavy items. °· If directed, apply ice to the painful area: °¨ Put ice in a plastic bag. °¨ Place a towel between your skin and the bag. °¨ Leave the ice on for 20 minutes, 2-3 times per day. °· Take over-the-counter and prescription medicines only as told by your health care provider. °· Do not use tobacco products, including cigarettes, chewing tobacco, and e-cigarettes. If you need help quitting, ask your health care provider. °· Keep all follow-up visits as told by your health care provider. This is important. °Contact a health care provider if: °· You have a fever. °· Your chest pain becomes worse. °· You have new symptoms. °Get help right away if: °· You have nausea or vomiting. °· You feel sweaty or light-headed. °· You have a cough with phlegm (sputum) or you cough up blood. °· You develop shortness of breath. °This information is not intended to replace advice given to you by your health care provider. Make sure you discuss any questions you have with your health care provider. °Document Released: 06/17/2005 Document Revised: 10/26/2015 Document Reviewed: 09/12/2014 °Elsevier Interactive Patient Education © 2017 [employer] ° °

## 2017-05-09 NOTE — MAU Note (Signed)
Urine in lab 

## 2017-05-19 ENCOUNTER — Encounter: Payer: Self-pay | Admitting: Advanced Practice Midwife

## 2017-05-19 ENCOUNTER — Ambulatory Visit (INDEPENDENT_AMBULATORY_CARE_PROVIDER_SITE_OTHER): Payer: 59 | Admitting: Advanced Practice Midwife

## 2017-05-19 ENCOUNTER — Other Ambulatory Visit: Payer: Self-pay

## 2017-05-19 VITALS — BP 133/63 | HR 88 | Wt 175.3 lb

## 2017-05-19 DIAGNOSIS — Z113 Encounter for screening for infections with a predominantly sexual mode of transmission: Secondary | ICD-10-CM | POA: Diagnosis not present

## 2017-05-19 DIAGNOSIS — Z3480 Encounter for supervision of other normal pregnancy, unspecified trimester: Secondary | ICD-10-CM | POA: Insufficient documentation

## 2017-05-19 DIAGNOSIS — O0991 Supervision of high risk pregnancy, unspecified, first trimester: Secondary | ICD-10-CM

## 2017-05-19 DIAGNOSIS — O0990 Supervision of high risk pregnancy, unspecified, unspecified trimester: Secondary | ICD-10-CM

## 2017-05-19 DIAGNOSIS — Z124 Encounter for screening for malignant neoplasm of cervix: Secondary | ICD-10-CM

## 2017-05-19 LAB — POCT URINALYSIS DIP (DEVICE)
Bilirubin Urine: NEGATIVE
GLUCOSE, UA: NEGATIVE mg/dL
KETONES UR: NEGATIVE mg/dL
LEUKOCYTES UA: NEGATIVE
Nitrite: NEGATIVE
PROTEIN: NEGATIVE mg/dL
SPECIFIC GRAVITY, URINE: 1.025 (ref 1.005–1.030)
Urobilinogen, UA: 0.2 mg/dL (ref 0.0–1.0)
pH: 6 (ref 5.0–8.0)

## 2017-05-19 NOTE — Addendum Note (Signed)
Addended by: Shelly Coss on: 05/19/2017 05:26 PM   Modules accepted: Orders

## 2017-05-19 NOTE — Progress Notes (Signed)
Subjective:   Sonia Allen is a 31 y.o. [id number] at [redacted]w[redacted]d by early ultrasound being seen today for her first obstetrical visit.  Her obstetrical history is significant for hx of GDM with prior pregnancy . Patient "thinking about it". I might do both.  intend to breast feed. Pregnancy history fully reviewed. Patient reports marijunana use. She states that this helps her with nausea and appetite.  Patient reports no complaints.  HISTORY: Obstetric History   G8   P4   T4   P0   A3   L4    SAB1   TAB2   Ectopic0   Multiple0   Live Births4     # Outcome Date GA Lbr Len/2nd Weight Sex Delivery Anes PTL Lv  8 Current           7 SAB 10/2016          6 Term 03/12/14 [redacted]w[redacted]d 06:41 / 00:16 7 lb 10.2 oz (3.465 kg) F Vag-Spont EPI  LIV     Name: Sonia Allen     Apgar1:  9                Apgar5: 9  5 Term 10/14/12 [redacted]w[redacted]d 03:40 / 00:13 7 lb 5.6 oz (3.334 kg) M Vag-Spont EPI  LIV     Name: Sonia Allen     Apgar1:  9                Apgar5: 9  4 TAB 2013 [redacted]w[redacted]d         3 Term 08/28/10 [redacted]w[redacted]d 07:00 8 lb 13 oz (3.997 kg) M Vag-Spont EPI  LIV  2 Term 01/14/08 [redacted]w[redacted]d 06:00 6 lb 2 oz (2.778 kg) M Vag-Spont EPI  LIV  1 TAB              Past Medical History:  Diagnosis Date  . Anemia    first and sec pregnancies  . Arrhythmia   . Chlamydia   . Fibroid   . Gestational diabetes    diet controlled  . Gonorrhea   . Headache(784.0)   . Hemorrhoids   . Hx of migraine headaches   . Leiomyoma of uterus in pregnancy    Past Surgical History:  Procedure Laterality Date  . INDUCED ABORTION  March  2 ,2013  . WISDOM TOOTH EXTRACTION     Family History  Problem Relation Age of Onset  . Other Neg Hx   . Alcohol abuse Neg Hx   . Arthritis Neg Hx   . Asthma Neg Hx   . Birth defects Neg Hx   . Cancer Neg Hx   . COPD Neg Hx   . Depression Neg Hx   . Diabetes Neg Hx   . Drug abuse Neg Hx   . Early death Neg Hx   . Heart disease Neg Hx   . Hearing loss Neg Hx   . Hyperlipidemia Neg Hx   .  Hypertension Neg Hx   . Kidney disease Neg Hx   . Learning disabilities Neg Hx   . Mental illness Neg Hx   . Mental retardation Neg Hx   . Miscarriages / Stillbirths Neg Hx   . Stroke Neg Hx   . Vision loss Neg Hx    Social History   Tobacco Use  . Smoking status: Never Smoker  . Smokeless tobacco: Never Used  Substance Use Topics  . Alcohol use: No  . Drug use: Yes    Frequency: 14.0 times per  week    Types: Marijuana   No Known Allergies Current Outpatient Medications on File Prior to Visit  Medication Sig Dispense Refill  . metoCLOPramide (REGLAN) 10 MG tablet Take 1 tablet (10 mg total) by mouth 3 (three) times daily before meals. 60 tablet 2  . Prenat w/o A Vit-FeFum-FePo-FA (CONCEPT OB) 130-92.4-1 MG CAPS Take 1 capsule by mouth daily. 30 capsule 11  . promethazine (PHENERGAN) 25 MG tablet Take 0.5-1 tablets (12.5-25 mg total) by mouth at bedtime as needed. 30 tablet 2  . cyclobenzaprine (FLEXERIL) 5 MG tablet Take 1 tablet (5 mg total) 3 (three) times daily as needed by mouth for muscle spasms. (Patient not taking: Reported on 05/19/2017) 10 tablet 0  . docusate sodium (COLACE) 50 MG capsule Take 1 capsule (50 mg total) 2 (two) times daily as needed by mouth for mild constipation. (Patient not taking: Reported on 05/09/2017) 30 capsule 1   No current facility-administered medications on file prior to visit.     Indications for early 1 hour GTT (per uptodate)  BMI >25 (>23 in Asian women) AND one of the following  Gestational diabetes mellitus in a previous pregnancy Yes Glycated hemoglobin ?5.7 percent (39 mmol/mol), impaired glucose tolerance, or impaired fasting glucose on previous testing No First-degree relative with diabetes Yes High-risk race/ethnicity (eg, African American, Latino, Native American, [place] American, Pacific Islander) Yes History of cardiovascular disease No Hypertension or on therapy for hypertension No High-density lipoprotein cholesterol level  <35 mg/dL (0.90 mmol/L) and/or a triglyceride level >250 mg/dL (2.82 mmol/L) No Polycystic ovary syndrome No Physical inactivity No Other clinical condition associated with insulin resistance (eg, severe obesity, acanthosis nigricans) No Previous birth of an infant weighing ?4000 g Yes Previous stillbirth of unknown cause No   Exam   Vitals:   05/19/17 0828  BP: 133/63  Pulse: 88  Weight: 175 lb 4.8 oz (79.5 kg)      Uterus:     Pelvic Exam: Perineum: no hemorrhoids, normal perineum   Vulva: normal external genitalia, no lesions   Vagina:  normal mucosa, normal discharge   Cervix: no lesions and normal, pap smear done.    Adnexa: normal adnexa and no mass, fullness, tenderness   Bony Pelvis: average  System: General: well-developed, well-nourished female in no acute distress   Breast:  normal appearance, no masses or tenderness   Skin: normal coloration and turgor, no rashes   Neurologic: oriented, normal, negative, normal mood   Extremities: normal strength, tone, and muscle mass, ROM of all joints is normal   HEENT PERRLA, extraocular movement intact and sclera clear, anicteric   Mouth/Teeth mucous membranes moist, pharynx normal without lesions and dental hygiene good   Neck supple and no masses   Cardiovascular: regular rate and rhythm   Respiratory:  no respiratory distress, normal breath sounds   Abdomen: soft, non-tender; bowel sounds normal; no masses,  no organomegaly     Assessment:   Pregnancy: [id number] Patient Active Problem List   Diagnosis Date Noted  . Supervision of high risk pregnancy, antepartum 05/19/2017  . Antepartum bleeding, first trimester 08/20/2016  . Uterine fibroids affecting pregnancy 10/14/2012  . Anemia 10/14/2012  . HEADACHE 02/18/2007     Plan:  There are no diagnoses linked to this encounter.  Initial labs drawn. Early 1 hour GTT Flu vax declined Continue prenatal vitamins. Genetic Screening discussed, First trimester screen,  Quad screen and NIPS: requested. FIRST screen  Ultrasound discussed; fetal anatomic survey: requested. Problem list reviewed and  updated. The nature of [facility] with multiple MDs and other Advanced Practice Providers was explained to patient; also emphasized that residents, students are part of our team. Routine obstetric precautions reviewed. Return in about 4 weeks (around 06/16/2017).   Marcille Buffy 9:18 AM 05/19/17

## 2017-05-19 NOTE — Progress Notes (Signed)
Declines flu Would like to think about Babyscripts Unsure about breastfeeding

## 2017-05-20 ENCOUNTER — Other Ambulatory Visit: Payer: Self-pay | Admitting: Advanced Practice Midwife

## 2017-05-20 DIAGNOSIS — O0990 Supervision of high risk pregnancy, unspecified, unspecified trimester: Secondary | ICD-10-CM

## 2017-05-20 LAB — OBSTETRIC PANEL, INCLUDING HIV
Antibody Screen: NEGATIVE
BASOS ABS: 0 10*3/uL (ref 0.0–0.2)
Basos: 0 %
EOS (ABSOLUTE): 0.1 10*3/uL (ref 0.0–0.4)
Eos: 1 %
HIV SCREEN 4TH GENERATION: NONREACTIVE
Hematocrit: 33.8 % — ABNORMAL LOW (ref 34.0–46.6)
Hemoglobin: 10.9 g/dL — ABNORMAL LOW (ref 11.1–15.9)
Hepatitis B Surface Ag: NEGATIVE
IMMATURE GRANULOCYTES: 0 %
Immature Grans (Abs): 0 10*3/uL (ref 0.0–0.1)
LYMPHS ABS: 1.5 10*3/uL (ref 0.7–3.1)
Lymphs: 26 %
MCH: 27.7 pg (ref 26.6–33.0)
MCHC: 32.2 g/dL (ref 31.5–35.7)
MCV: 86 fL (ref 79–97)
MONOCYTES: 5 %
Monocytes Absolute: 0.3 10*3/uL (ref 0.1–0.9)
NEUTROS ABS: 4 10*3/uL (ref 1.4–7.0)
NEUTROS PCT: 68 %
PLATELETS: 310 10*3/uL (ref 150–379)
RBC: 3.93 x10E6/uL (ref 3.77–5.28)
RDW: 15 % (ref 12.3–15.4)
RPR Ser Ql: NONREACTIVE
Rh Factor: POSITIVE
Rubella Antibodies, IGG: 17 index (ref >0.99)
WBC: 5.8 10*3/uL (ref 3.4–10.8)

## 2017-05-20 LAB — CYTOLOGY - PAP
CHLAMYDIA, DNA PROBE: NEGATIVE
DIAGNOSIS: NEGATIVE
Neisseria Gonorrhea: NEGATIVE

## 2017-05-20 LAB — GLUCOSE TOLERANCE, 1 HOUR: GLUCOSE, 1HR PP: 80 mg/dL (ref 65–199)

## 2017-05-20 LAB — HEMOGLOBIN A1C
Est. average glucose Bld gHb Est-mCnc: 120 mg/dL
HEMOGLOBIN A1C: 5.8 % — AB (ref 4.8–5.6)

## 2017-05-20 MED ORDER — TERCONAZOLE 0.4 % VA CREA
1.0000 | TOPICAL_CREAM | Freq: Every day | VAGINAL | 0 refills | Status: DC
Start: 2017-05-20 — End: 2017-06-23

## 2017-05-20 NOTE — Progress Notes (Signed)
+  yeast on pap. Sent in rx for terazol 7 as directed to pharmacy on file.  Marcille Buffy  5:40 PM 05/20/17

## 2017-05-21 LAB — CULTURE, OB URINE

## 2017-05-21 LAB — URINE CULTURE, OB REFLEX

## 2017-05-24 LAB — MONITOR DRUG PROFILE 10(MW)
AMPHETAMINE SCREEN URINE: NEGATIVE ng/mL
BARBITURATE SCREEN URINE: NEGATIVE ng/mL
BENZODIAZEPINE SCREEN, URINE: NEGATIVE ng/mL
CREATININE(CRT), U: 139.2 mg/dL (ref 20.0–300.0)
Cocaine (Metab) Scrn, Ur: NEGATIVE ng/mL
Methadone Screen, Urine: NEGATIVE ng/mL
OPIATE SCREEN URINE: NEGATIVE ng/mL
OXYCODONE+OXYMORPHONE UR QL SCN: NEGATIVE ng/mL
PH UR, DRUG SCRN: 6.8 (ref 4.5–8.9)
PHENCYCLIDINE QUANTITATIVE URINE: NEGATIVE ng/mL
Propoxyphene Scrn, Ur: NEGATIVE ng/mL

## 2017-05-24 LAB — CANNABINOID (GC/MS), URINE
CARBOXY THC UR: 83 ng/mL
Cannabinoid: POSITIVE — AB

## 2017-05-29 ENCOUNTER — Encounter (HOSPITAL_COMMUNITY): Payer: Self-pay | Admitting: Advanced Practice Midwife

## 2017-06-05 ENCOUNTER — Other Ambulatory Visit: Payer: Self-pay | Admitting: Advanced Practice Midwife

## 2017-06-05 ENCOUNTER — Encounter (HOSPITAL_COMMUNITY): Payer: Self-pay

## 2017-06-05 ENCOUNTER — Ambulatory Visit (HOSPITAL_COMMUNITY)
Admission: RE | Admit: 2017-06-05 | Discharge: 2017-06-05 | Disposition: A | Discharge status: Home / Self Care | Payer: 59 | Source: Ambulatory Visit | Attending: Advanced Practice Midwife | Admitting: Advanced Practice Midwife

## 2017-06-05 DIAGNOSIS — Z3682 Encounter for antenatal screening for nuchal translucency: Secondary | ICD-10-CM | POA: Diagnosis not present

## 2017-06-05 DIAGNOSIS — O3411 Maternal care for benign tumor of corpus uteri, first trimester: Secondary | ICD-10-CM

## 2017-06-05 DIAGNOSIS — D259 Leiomyoma of uterus, unspecified: Secondary | ICD-10-CM

## 2017-06-05 DIAGNOSIS — Z3A12 12 weeks gestation of pregnancy: Secondary | ICD-10-CM

## 2017-06-05 DIAGNOSIS — O09299 Supervision of pregnancy with other poor reproductive or obstetric history, unspecified trimester: Secondary | ICD-10-CM

## 2017-06-05 DIAGNOSIS — F1210 Cannabis abuse, uncomplicated: Secondary | ICD-10-CM

## 2017-06-05 DIAGNOSIS — Z8632 Personal history of gestational diabetes: Secondary | ICD-10-CM

## 2017-06-05 DIAGNOSIS — O0991 Supervision of high risk pregnancy, unspecified, first trimester: Secondary | ICD-10-CM

## 2017-06-05 DIAGNOSIS — O09291 Supervision of pregnancy with other poor reproductive or obstetric history, first trimester: Secondary | ICD-10-CM | POA: Insufficient documentation

## 2017-06-07 ENCOUNTER — Other Ambulatory Visit: Payer: Self-pay | Admitting: Advanced Practice Midwife

## 2017-06-07 DIAGNOSIS — O0990 Supervision of high risk pregnancy, unspecified, unspecified trimester: Secondary | ICD-10-CM

## 2017-06-16 ENCOUNTER — Ambulatory Visit (INDEPENDENT_AMBULATORY_CARE_PROVIDER_SITE_OTHER): Payer: 59 | Admitting: Obstetrics and Gynecology

## 2017-06-16 ENCOUNTER — Encounter: Payer: Self-pay | Admitting: Obstetrics and Gynecology

## 2017-06-16 VITALS — BP 117/67 | HR 81 | Wt 176.4 lb

## 2017-06-16 DIAGNOSIS — O3410 Maternal care for benign tumor of corpus uteri, unspecified trimester: Secondary | ICD-10-CM

## 2017-06-16 DIAGNOSIS — F1290 Cannabis use, unspecified, uncomplicated: Secondary | ICD-10-CM

## 2017-06-16 DIAGNOSIS — O0990 Supervision of high risk pregnancy, unspecified, unspecified trimester: Secondary | ICD-10-CM

## 2017-06-16 DIAGNOSIS — D259 Leiomyoma of uterus, unspecified: Secondary | ICD-10-CM

## 2017-06-16 HISTORY — DX: Cannabis use, unspecified, uncomplicated: F12.90

## 2017-06-16 NOTE — Progress Notes (Signed)
States did not know about terazole. Informed her she has yeast infection and can get the terazole from her pharmacy. C/o pain in hands sometimes.

## 2017-06-16 NOTE — Progress Notes (Signed)
   PRENATAL VISIT NOTE  Subjective:  Sonia Allen is a 31 y.o. [id number] at [redacted]w[redacted]d being seen today for ongoing prenatal care.  She is currently monitored for the following issues for this high-risk pregnancy and has HEADACHE; Uterine fibroids affecting pregnancy; Anemia; Antepartum bleeding, first trimester; and Supervision of high risk pregnancy, antepartum on their problem list.  Patient reports no complaints.  Contractions: Not present. Vag. Bleeding: None.  Movement: Present. Denies leaking of fluid.   The following portions of the patient's history were reviewed and updated as appropriate: allergies, current medications, past family history, past medical history, past social history, past surgical history and problem list. Problem list updated.  Objective:   Vitals:   06/16/17 1631  BP: 117/67  Pulse: 81  Weight: 176 lb 6.4 oz (80 kg)    Fetal Status: Fetal Heart Rate (bpm): 160   Movement: Present     General:  Alert, oriented and cooperative. Patient is in no acute distress.  Skin: Skin is warm and dry. No rash noted.   Cardiovascular: Normal heart rate noted  Respiratory: Normal respiratory effort, no problems with respiration noted  Abdomen: Soft, gravid, appropriate for gestational age.  Pain/Pressure: Present     Pelvic: Cervical exam deferred        Extremities: Normal range of motion.  Edema: None  Mental Status:  Normal mood and affect. Normal behavior. Normal judgment and thought content.   Assessment and Plan:  Pregnancy: [id number] at [redacted]w[redacted]d  1. Leiomyoma of uterus affecting pregnancy, antepartum  2. Supervision of high risk pregnancy, antepartum Normal NT declines further genetic screen  Anatomy ordered  3. Marijuana use Still using Reviewed that she will see SW in hospital  4. Hand pain Digits 3/4 on right hand S/p fall Not improving rec to see primary care  5. H/o gestational DM Normal early 1 hr GTT  Preterm labor symptoms and general obstetric  precautions including but not limited to vaginal bleeding, contractions, leaking of fluid and fetal movement were reviewed in detail with the patient. Please refer to After Visit Summary for other counseling recommendations.  No Follow-up on file.   Sloan Leiter, MD

## 2017-06-23 ENCOUNTER — Inpatient Hospital Stay (HOSPITAL_COMMUNITY)
Admission: AD | Admit: 2017-06-23 | Discharge: 2017-06-23 | Disposition: A | Discharge status: Home / Self Care | Payer: 59 | Source: Ambulatory Visit | Attending: Obstetrics & Gynecology | Admitting: Obstetrics & Gynecology

## 2017-06-23 ENCOUNTER — Encounter (HOSPITAL_COMMUNITY): Payer: Self-pay | Admitting: *Deleted

## 2017-06-23 ENCOUNTER — Other Ambulatory Visit (HOSPITAL_COMMUNITY): Payer: Self-pay

## 2017-06-23 DIAGNOSIS — J069 Acute upper respiratory infection, unspecified: Secondary | ICD-10-CM | POA: Insufficient documentation

## 2017-06-23 DIAGNOSIS — Z3A15 15 weeks gestation of pregnancy: Secondary | ICD-10-CM | POA: Diagnosis not present

## 2017-06-23 DIAGNOSIS — O9989 Other specified diseases and conditions complicating pregnancy, childbirth and the puerperium: Secondary | ICD-10-CM | POA: Diagnosis not present

## 2017-06-23 DIAGNOSIS — O99322 Drug use complicating pregnancy, second trimester: Secondary | ICD-10-CM | POA: Insufficient documentation

## 2017-06-23 DIAGNOSIS — F1290 Cannabis use, unspecified, uncomplicated: Secondary | ICD-10-CM | POA: Diagnosis not present

## 2017-06-23 DIAGNOSIS — O26892 Other specified pregnancy related conditions, second trimester: Secondary | ICD-10-CM | POA: Insufficient documentation

## 2017-06-23 DIAGNOSIS — J029 Acute pharyngitis, unspecified: Secondary | ICD-10-CM | POA: Diagnosis present

## 2017-06-23 DIAGNOSIS — O99332 Smoking (tobacco) complicating pregnancy, second trimester: Secondary | ICD-10-CM | POA: Insufficient documentation

## 2017-06-23 LAB — URINALYSIS, ROUTINE W REFLEX MICROSCOPIC
BILIRUBIN URINE: NEGATIVE
Glucose, UA: NEGATIVE mg/dL
Ketones, ur: 20 mg/dL — AB
LEUKOCYTES UA: NEGATIVE
NITRITE: NEGATIVE
PROTEIN: NEGATIVE mg/dL
Specific Gravity, Urine: 1.023 (ref 1.005–1.030)
pH: 6 (ref 5.0–8.0)

## 2017-06-23 LAB — INFLUENZA PANEL BY PCR (TYPE A & B)
Influenza A By PCR: NEGATIVE
Influenza B By PCR: NEGATIVE

## 2017-06-23 MED ORDER — ACETAMINOPHEN 500 MG PO TABS
1000.0000 mg | ORAL_TABLET | Freq: Once | ORAL | Status: AC
  Administered 2017-06-23: 1000 mg via ORAL
  Filled 2017-06-23: qty 2

## 2017-06-23 NOTE — MAU Note (Signed)
Pt reports she has been having a sore throat, earache body ache and back pain since last night. Reports her Abd was feeling "heavy"t night  As well.

## 2017-06-23 NOTE — MAU Provider Note (Signed)
Chief Complaint: Sore Throat; Generalized Body Aches; and Back Pain   First Provider Initiated Contact with Patient 06/23/17 1439     SUBJECTIVE HPI: Sonia Allen is a 31 y.o. [id number] at [redacted]w[redacted]d by LMP who presents to maternity admissions reporting body ache, headache, and sore throat. She reports that all her symptoms started yesterday. She states she "has not been around any one that is sick, that she knows". She has not taken any medication for headache or body pains. She denies abdominal pain, vaginal bleeding, vaginal itching/burning, urinary symptoms, h/a, dizziness, n/v, or fever/chills.    Past Medical History:  Diagnosis Date  . Anemia    first and sec pregnancies  . Arrhythmia   . Chlamydia   . Fibroid   . Gestational diabetes    diet controlled  . Gonorrhea   . Headache(784.0)   . Hemorrhoids   . Hx of migraine headaches   . Leiomyoma of uterus in pregnancy    Past Surgical History:  Procedure Laterality Date  . INDUCED ABORTION  March  2 ,2013  . WISDOM TOOTH EXTRACTION     Social History   Socioeconomic History  . Marital status: Single    Spouse name: Not on file  . Number of children: Not on file  . Years of education: Not on file  . Highest education level: Not on file  Social Needs  . Financial resource strain: Not on file  . Food insecurity - worry: Not on file  . Food insecurity - inability: Not on file  . Transportation needs - medical: Not on file  . Transportation needs - non-medical: Not on file  Occupational History  . Not on file  Tobacco Use  . Smoking status: Current Every Day Smoker  . Smokeless tobacco: Never Used  Substance and Sexual Activity  . Alcohol use: No  . Drug use: Yes    Frequency: 14.0 times per week    Types: Marijuana  . Sexual activity: Not Currently    Birth control/protection: None  Other Topics Concern  . Not on file  Social History Narrative  . Not on file   No current facility-administered medications on file  prior to encounter.    Current Outpatient Medications on File Prior to Encounter  Medication Sig Dispense Refill  . cyclobenzaprine (FLEXERIL) 5 MG tablet Take 1 tablet (5 mg total) 3 (three) times daily as needed by mouth for muscle spasms. 10 tablet 0  . docusate sodium (COLACE) 50 MG capsule Take 1 capsule (50 mg total) 2 (two) times daily as needed by mouth for mild constipation. 30 capsule 1  . metoCLOPramide (REGLAN) 10 MG tablet Take 1 tablet (10 mg total) by mouth 3 (three) times daily before meals. 60 tablet 2  . Prenat w/o A Vit-FeFum-FePo-FA (CONCEPT OB) 130-92.4-1 MG CAPS Take 1 capsule by mouth daily. 30 capsule 11  . promethazine (PHENERGAN) 25 MG tablet Take 0.5-1 tablets (12.5-25 mg total) by mouth at bedtime as needed. 30 tablet 2  . terconazole (TERAZOL 7) 0.4 % vaginal cream Place 1 applicator vaginally at bedtime. (Patient not taking: Reported on 06/05/2017) 45 g 0   No Known Allergies  ROS:  Review of Systems  Constitutional: Negative for chills and fever.  HENT: Positive for congestion and sore throat. Negative for ear pain, facial swelling, sinus pressure, sinus pain and trouble swallowing.   Respiratory: Negative for cough, shortness of breath and wheezing.   Cardiovascular: Negative.   Gastrointestinal: Negative.   Genitourinary: Negative.  Musculoskeletal: Positive for back pain. Negative for neck pain.  Skin: Negative.   Neurological: Positive for headaches. Negative for dizziness, weakness and light-headedness.  Psychiatric/Behavioral: Negative.    I have reviewed patient's Past Medical Hx, Surgical Hx, Family Hx, Social Hx, medications and allergies.   Physical Exam   Patient Vitals for the past 24 hrs:  BP Temp Pulse Resp Height Weight  06/23/17 1611 (!) 110/54 - 100 18 - -  06/23/17 1342 (!) 118/59 98.7 F (37.1 C) 91 18 5\' 4"  (1.626 m) 177 lb (80.3 kg)   Constitutional: Well-developed, well-nourished female in no acute distress.  Cardiovascular:  normal rate Respiratory: normal effort GI: Abd soft, non-tender. Pos BS x 4 MS: Extremities nontender, no edema, normal ROM Neurologic: Alert and oriented x 4.  GU: Neg CVAT.  LAB RESULTS Results for orders placed or performed during the hospital encounter of 06/23/17 (from the past 24 hour(s))  Urinalysis, Routine w reflex microscopic     Status: Abnormal   Collection Time: 06/23/17  2:05 PM  Result Value Ref Range   Color, Urine YELLOW YELLOW   APPearance HAZY (A) CLEAR   Specific Gravity, Urine 1.023 1.005 - 1.030   pH 6.0 5.0 - 8.0   Glucose, UA NEGATIVE NEGATIVE mg/dL   Hgb urine dipstick SMALL (A) NEGATIVE   Bilirubin Urine NEGATIVE NEGATIVE   Ketones, ur 20 (A) NEGATIVE mg/dL   Protein, ur NEGATIVE NEGATIVE mg/dL   Nitrite NEGATIVE NEGATIVE   Leukocytes, UA NEGATIVE NEGATIVE   RBC / HPF 0-5 0 - 5 RBC/hpf   WBC, UA 0-5 0 - 5 WBC/hpf   Bacteria, UA RARE (A) NONE SEEN   Squamous Epithelial / LPF 0-5 (A) NONE SEEN   Mucus PRESENT   Influenza panel by PCR (type A & B)     Status: None   Collection Time: 06/23/17  2:13 PM  Result Value Ref Range   Influenza A By PCR NEGATIVE NEGATIVE   Influenza B By PCR NEGATIVE NEGATIVE    B/Positive/-- (11/19 5366)  MAU Management/MDM: Orders Placed This Encounter  Procedures  . Culture, group A strep  . Urinalysis, Routine w reflex microscopic  . Influenza panel by PCR (type A & B)  . Droplet precaution  . Discharge patient   Meds ordered this encounter  Medications  . acetaminophen (TYLENOL) tablet 1,000 mg    Treatments in MAU included 1,000mg  tylenol- pain relieved by tylenol.   Pt discharged with information and list of medication safe to take in pregnancy.  ASSESSMENT 1. Upper respiratory infection, acute   2. Marijuana use     PLAN Discharge home Will call patient with positive result from Strep  List of medications safe in pregnancy given Follow up as scheduled and return to MAU as needed for  emergencies   Allergies as of 06/23/2017   No Known Allergies     Medication List    STOP taking these medications   terconazole 0.4 % vaginal cream Commonly known as:  TERAZOL 7     TAKE these medications   CONCEPT OB 130-92.4-1 MG Caps Take 1 capsule by mouth daily.   cyclobenzaprine 5 MG tablet Commonly known as:  FLEXERIL Take 1 tablet (5 mg total) 3 (three) times daily as needed by mouth for muscle spasms.   docusate sodium 50 MG capsule Commonly known as:  COLACE Take 1 capsule (50 mg total) 2 (two) times daily as needed by mouth for mild constipation.   metoCLOPramide 10 MG  tablet Commonly known as:  REGLAN Take 1 tablet (10 mg total) by mouth 3 (three) times daily before meals.   promethazine 25 MG tablet Commonly known as:  PHENERGAN Take 0.5-1 tablets (12.5-25 mg total) by mouth at bedtime as needed. What changed:  reasons to take this      Darrol Poke  Certified Nurse-Midwife 06/23/2017  3:18 PM

## 2017-06-26 LAB — CULTURE, GROUP A STREP (THRC)

## 2017-07-01 NOTE — L&D Delivery Note (Addendum)
Delivery Note Sonia Allen is a 32 y.o. [id number] at [redacted]w[redacted]d admitted for IOL d/t bpp 6/8, postdates.  Labor course: pitocin, AROM At 0507 a viable female was delivered via spontaneous vaginal delivery (Presentation: LOA) with compound hand through a loose nuchal.  Infant placed directly on mom's abdomen for bonding/skin-to-skin. Delayed cord clamping x 63min, then cord clamped x 2, and cut by pt's mom.  APGAR: 8,9 ; weight: pending at time of note.  40 units of pitocin diluted in 1000cc LR was infused rapidly IV per protocol. The placenta separated spontaneously and delivered via CCT and maternal pushing effort.  It was inspected and appears to be intact with a 3 VC.  Placenta/Cord with the following complications: ~41% area of blood collection under fetal surface membranes.  Cord pH: not done  Intrapartum complications:  Small amt BRB, few elevated bp's, labs pending-will continue to monitor Anesthesia:  epidural Episiotomy: none Lacerations:  none Suture Repair: n/a Est. Blood Loss (mL): 350 Sponge and instrument count were correct x2.  Mom to postpartum.  Baby to Couplet care / Skin to Skin. Placenta to L&D. Plans to breast & bottlefeed Contraception: BTL, consent 5/22 Circ: n/a  Roma Schanz CNM, WHNP-BC 12/17/2017 5:22 AM   Gertie Exon, Royetta Crochet, CNM  [facility] Admin Pool        Please schedule this patient for PP visit in: 1wk bp check (few intrapartum elevated bp's, pre-e labs pending at time of delivery  Low risk pregnancy complicated by: none  Delivery mode: SVD  Anticipated Birth Control: Plans BTL  PP Procedures needed: BP check  Schedule Integrated BH visit: no  Provider: Any provider

## 2017-07-16 ENCOUNTER — Encounter: Payer: 59 | Admitting: Obstetrics and Gynecology

## 2017-07-28 ENCOUNTER — Ambulatory Visit (HOSPITAL_COMMUNITY)
Admission: RE | Admit: 2017-07-28 | Discharge: 2017-07-28 | Disposition: A | Discharge status: Home / Self Care | Payer: 59 | Source: Ambulatory Visit | Attending: Obstetrics and Gynecology | Admitting: Obstetrics and Gynecology

## 2017-07-28 DIAGNOSIS — Z3A20 20 weeks gestation of pregnancy: Secondary | ICD-10-CM | POA: Diagnosis not present

## 2017-07-28 DIAGNOSIS — O0990 Supervision of high risk pregnancy, unspecified, unspecified trimester: Secondary | ICD-10-CM

## 2017-07-28 DIAGNOSIS — Z363 Encounter for antenatal screening for malformations: Secondary | ICD-10-CM | POA: Diagnosis not present

## 2017-08-05 ENCOUNTER — Ambulatory Visit (INDEPENDENT_AMBULATORY_CARE_PROVIDER_SITE_OTHER): Payer: 59 | Admitting: Family Medicine

## 2017-08-05 ENCOUNTER — Encounter: Payer: Self-pay | Admitting: General Practice

## 2017-08-05 VITALS — BP 108/65 | HR 106 | Temp 100.0°F | Wt 173.7 lb

## 2017-08-05 DIAGNOSIS — J111 Influenza due to unidentified influenza virus with other respiratory manifestations: Secondary | ICD-10-CM

## 2017-08-05 DIAGNOSIS — O0992 Supervision of high risk pregnancy, unspecified, second trimester: Secondary | ICD-10-CM

## 2017-08-05 DIAGNOSIS — O0990 Supervision of high risk pregnancy, unspecified, unspecified trimester: Secondary | ICD-10-CM

## 2017-08-05 MED ORDER — OSELTAMIVIR PHOSPHATE 75 MG PO CAPS
75.0000 mg | ORAL_CAPSULE | Freq: Two times a day (BID) | ORAL | 0 refills | Status: AC
Start: 2017-08-05 — End: 2017-08-10

## 2017-08-05 MED ORDER — ONDANSETRON HCL 4 MG PO TABS: 4.0000 mg | ORAL_TABLET | Freq: Three times a day (TID) | ORAL | 0 refills | Status: DC | PRN

## 2017-08-05 NOTE — Progress Notes (Signed)
Patient c/o flu-like symptoms

## 2017-08-05 NOTE — Patient Instructions (Signed)
Pregnancy and Influenza Influenza, also called the flu, is an infection of the respiratory tract. If you are pregnant, you are more likely to catch the flu. You are also more likely to have a more serious case of the flu. This is because pregnancy lowers your body's ability to fight off infections (it weakens your immune system). It also puts additional stress on your heart and lungs, which makes you more likely to have complications. Having a bad case of the flu, especially with a high fever, can be dangerous for your developing baby. It can cause you to go into early labor. How do people get the flu? The flu is caused by the influenza virus. This virus is common every year in the fall and winter. It spreads when virus particles get passed from person to person. You can get the virus if you are near a sick person who is coughing or sneezing. You can also get the virus if you touch something that has the virus on it and then touch your face. How can I protect myself against the flu?  Get a flu shot. The best way to prevent the flu is to get a flu shot before flu season starts. The flu shot is not dangerous for your developing baby. It may even help protect your baby from the flu for up to 6 months after birth. The flu shot is one type of flu vaccine. Another type is a nasal spray vaccine. Do not get the nasal spray vaccine. It is not approved for pregnancy.  Do not come in close contact with sick people.  Do not share food, drinks, or utensils with other people.  Wash your hands often. Use hand sanitizer when soap and water are not available. What should I do if I have flu symptoms? If you have any flu symptoms, call your health care provider right away. Flu symptoms include:  Fever or chills.  Muscle aches.  Headache.  Sore throat.  Nasal congestion.  Cough.  Feeling tired.  Loss of appetite.  Vomiting.  Diarrhea.  You may be able to take an antiviral medicine to keep the flu  from becoming severe and to shorten how long it lasts. What should I do at home if I am diagnosed with the flu?  Do not take any medicine, including cold or flu medicine, unless directed by your health care provider.  If you take antiviral medicine, make sure you finish it even if you start to feel better.  Drink enough fluid to keep your urine clear or pale yellow.  Get plenty of rest. When would I seek immediate medical care if I have the flu?  You have trouble breathing.  You have chest pain.  You begin to have labor pains.  You have a high fever that does not go down after you take medicine.  You do not feel your baby move.  You have diarrhea or vomiting that will not go away. This information is not intended to replace advice given to you by your health care provider. Make sure you discuss any questions you have with your health care provider. Document Released: 04/19/2008 Document Revised: 11/23/2015 Document Reviewed: 05/14/2013 Elsevier Interactive Patient Education  2017 [employer].

## 2017-08-05 NOTE — Progress Notes (Signed)
   PRENATAL VISIT NOTE  Subjective:  Sonia Allen is a 32 y.o. [id number] at [redacted]w[redacted]d being seen today for ongoing prenatal care.  She is currently monitored for the following issues for this high-risk pregnancy and has HEADACHE; Uterine fibroids affecting pregnancy; Anemia; Antepartum bleeding, first trimester; Supervision of high risk pregnancy, antepartum; Marijuana use; and Flu on their problem list.  Patient reports fever and body aches x 1 day. Denies leaking of fluid.   The following portions of the patient's history were reviewed and updated as appropriate: allergies, current medications, past family history, past medical history, past social history, past surgical history and problem list. Problem list updated.  Objective:   Vitals:   08/05/17 1644  BP: 108/65  Pulse: (!) 106  Temp: 100 F (37.8 C)  Weight: 173 lb 11.2 oz (78.8 kg)    Fetal Status: Fetal Heart Rate (bpm): 158   Movement: Present     General:  Alert, oriented and cooperative. Patient is in no acute distress.  Skin: Skin is warm and dry. No rash noted.   Cardiovascular: Normal heart rate noted  Respiratory: Normal respiratory effort, no problems with respiration noted  Abdomen: Soft, gravid, appropriate for gestational age.  Pain/Pressure: Present     Pelvic: Cervical exam deferred        Extremities: Normal range of motion.  Edema: None  Mental Status:  Normal mood and affect. Normal behavior. Normal judgment and thought content.   Assessment and Plan:  Pregnancy: [id number] at [redacted]w[redacted]d  1. Supervision of high risk pregnancy, antepartum   2. Flu tamiflu x 5 days and zofran for nausea  Preterm labor symptoms and general obstetric precautions including but not limited to vaginal bleeding, contractions, leaking of fluid and fetal movement were reviewed in detail with the patient. Please refer to After Visit Summary for other counseling recommendations.  Return in about 4 weeks (around 09/02/2017).   Dannielle Huh,  DO

## 2017-09-02 ENCOUNTER — Encounter: Payer: Self-pay | Admitting: Obstetrics and Gynecology

## 2017-09-02 ENCOUNTER — Ambulatory Visit (INDEPENDENT_AMBULATORY_CARE_PROVIDER_SITE_OTHER): Payer: 59 | Admitting: Obstetrics and Gynecology

## 2017-09-02 VITALS — BP 103/61 | HR 79 | Wt 181.4 lb

## 2017-09-02 DIAGNOSIS — F1290 Cannabis use, unspecified, uncomplicated: Secondary | ICD-10-CM

## 2017-09-02 DIAGNOSIS — R300 Dysuria: Secondary | ICD-10-CM

## 2017-09-02 DIAGNOSIS — O0990 Supervision of high risk pregnancy, unspecified, unspecified trimester: Secondary | ICD-10-CM

## 2017-09-02 DIAGNOSIS — O0993 Supervision of high risk pregnancy, unspecified, third trimester: Secondary | ICD-10-CM

## 2017-09-02 NOTE — Progress Notes (Signed)
   PRENATAL VISIT NOTE  Subjective:  Sonia Allen is a 32 y.o. [phone] at [redacted]w[redacted]d being seen today for ongoing prenatal care.  She is currently monitored for the following issues for this low-risk pregnancy and has HEADACHE; Uterine fibroids affecting pregnancy; Anemia; Antepartum bleeding, first trimester; Supervision of high risk pregnancy, antepartum; Marijuana use; and Flu on their problem list.  Patient reports occasional contractions. Also with lower extremity swelling and sole of foot pain. Contractions: Irritability. Vag. Bleeding: None.  Movement: Present. Denies leaking of fluid.   The following portions of the patient's history were reviewed and updated as appropriate: allergies, current medications, past family history, past medical history, past social history, past surgical history and problem list. Problem list updated.  Objective:   Vitals:   09/02/17 1637  BP: 103/61  Pulse: 79  Weight: 181 lb 6.4 oz (82.3 kg)    Fetal Status: Fetal Heart Rate (bpm): 145   Movement: Present     General:  Alert, oriented and cooperative. Patient is in no acute distress.  Skin: Skin is warm and dry. No rash noted.   Cardiovascular: Normal heart rate noted  Respiratory: Normal respiratory effort, no problems with respiration noted  Abdomen: Soft, gravid, appropriate for gestational age.  Pain/Pressure: Present     Pelvic: Cervical exam deferred        Extremities: Normal range of motion.  Edema: Mild pitting, slight indentation  Mental Status:  Normal mood and affect. Normal behavior. Normal judgment and thought content.   Assessment and Plan:  Pregnancy: [id number] at [redacted]w[redacted]d  1. Supervision of high risk pregnancy, antepartum - Considering BTL, counseled today  2. Marijuana use - First UDS positive  3. Dysuria - Urine Culture   Preterm labor symptoms and general obstetric precautions including but not limited to vaginal bleeding, contractions, leaking of fluid and fetal movement  were reviewed in detail with the patient. Please refer to After Visit Summary for other counseling recommendations.  Return in about 2 weeks (around 09/16/2017) for 2 hr GTT, OB visit.   Sloan Leiter, MD

## 2017-09-02 NOTE — Progress Notes (Signed)
Pt states feet really hurt, stands all day at work, having burning sensation when urinate

## 2017-09-06 LAB — URINE CULTURE

## 2017-09-18 ENCOUNTER — Other Ambulatory Visit: Payer: 59

## 2017-09-22 ENCOUNTER — Ambulatory Visit (INDEPENDENT_AMBULATORY_CARE_PROVIDER_SITE_OTHER): Payer: 59 | Admitting: Family Medicine

## 2017-09-22 VITALS — BP 114/63 | HR 88 | Wt 185.6 lb

## 2017-09-22 DIAGNOSIS — O479 False labor, unspecified: Secondary | ICD-10-CM

## 2017-09-22 DIAGNOSIS — O471 False labor at or after 37 completed weeks of gestation: Secondary | ICD-10-CM

## 2017-09-22 DIAGNOSIS — O4700 False labor before 37 completed weeks of gestation, unspecified trimester: Secondary | ICD-10-CM

## 2017-09-22 DIAGNOSIS — O0993 Supervision of high risk pregnancy, unspecified, third trimester: Secondary | ICD-10-CM

## 2017-09-22 DIAGNOSIS — O0990 Supervision of high risk pregnancy, unspecified, unspecified trimester: Secondary | ICD-10-CM

## 2017-09-22 MED ORDER — NIFEDIPINE ER OSMOTIC RELEASE 30 MG PO TB24: 30.0000 mg | ORAL_TABLET | Freq: Every day | ORAL | 2 refills | Status: DC

## 2017-09-22 NOTE — Progress Notes (Signed)
   PRENATAL VISIT NOTE  Subjective:  Sonia Allen is a 32 y.o. [phone] at [redacted]w[redacted]d being seen today for ongoing prenatal care.  She is currently monitored for the following issues for this low-risk pregnancy and has HEADACHE; Uterine fibroids affecting pregnancy; Anemia; Antepartum bleeding, first trimester; Supervision of high risk pregnancy, antepartum; Marijuana use; and Flu on their problem list.  Patient reports contractions for several days - having several an hour. Worse with moving around and ambulation..  Contractions: Irritability. Vag. Bleeding: None.  Movement: Present. Denies leaking of fluid.   The following portions of the patient's history were reviewed and updated as appropriate: allergies, current medications, past family history, past medical history, past social history, past surgical history and problem list. Problem list updated.  Objective:   Vitals:   09/22/17 1624  BP: 114/63  Pulse: 88  Weight: 185 lb 9.6 oz (84.2 kg)    Fetal Status: Fetal Heart Rate (bpm): 144 Fundal Height: 28 cm Movement: Present  Presentation: Vertex  General:  Alert, oriented and cooperative. Patient is in no acute distress.  Skin: Skin is warm and dry. No rash noted.   Cardiovascular: Normal heart rate noted  Respiratory: Normal respiratory effort, no problems with respiration noted  Abdomen: Soft, gravid, appropriate for gestational age.  Pain/Pressure: Present     Pelvic: Cervical exam performed Dilation: Closed Effacement (%): 20 Station: -3  Extremities: Normal range of motion.  Edema: Trace  Mental Status:  Normal mood and affect. Normal behavior. Normal judgment and thought content.   Assessment and Plan:  Pregnancy: [id number] at [redacted]w[redacted]d  1. Supervision of high risk pregnancy, antepartum FHT and FH normal - Culture, OB Urine  2. Preterm uterine contractions No cervical dilation Cervix firm Procardia for symptomatic relief  Preterm labor symptoms and general obstetric  precautions including but not limited to vaginal bleeding, contractions, leaking of fluid and fetal movement were reviewed in detail with the patient. Please refer to After Visit Summary for other counseling recommendations.  No follow-ups on file.   Truett Mainland, DO

## 2017-09-24 LAB — URINE CULTURE, OB REFLEX

## 2017-09-24 LAB — CULTURE, OB URINE

## 2017-09-29 ENCOUNTER — Encounter (HOSPITAL_COMMUNITY): Payer: Self-pay

## 2017-09-29 ENCOUNTER — Inpatient Hospital Stay (HOSPITAL_COMMUNITY): Payer: 59

## 2017-09-29 ENCOUNTER — Other Ambulatory Visit: Payer: Self-pay

## 2017-09-29 ENCOUNTER — Inpatient Hospital Stay (HOSPITAL_COMMUNITY)
Admission: AD | Admit: 2017-09-29 | Discharge: 2017-09-29 | Disposition: A | Discharge status: Home / Self Care | Payer: 59 | Source: Ambulatory Visit | Attending: Obstetrics and Gynecology | Admitting: Obstetrics and Gynecology

## 2017-09-29 DIAGNOSIS — O26893 Other specified pregnancy related conditions, third trimester: Secondary | ICD-10-CM | POA: Insufficient documentation

## 2017-09-29 DIAGNOSIS — M549 Dorsalgia, unspecified: Secondary | ICD-10-CM | POA: Diagnosis present

## 2017-09-29 DIAGNOSIS — O99333 Smoking (tobacco) complicating pregnancy, third trimester: Secondary | ICD-10-CM | POA: Insufficient documentation

## 2017-09-29 DIAGNOSIS — O9989 Other specified diseases and conditions complicating pregnancy, childbirth and the puerperium: Secondary | ICD-10-CM

## 2017-09-29 DIAGNOSIS — Z3A29 29 weeks gestation of pregnancy: Secondary | ICD-10-CM

## 2017-09-29 DIAGNOSIS — O99891 Other specified diseases and conditions complicating pregnancy: Secondary | ICD-10-CM

## 2017-09-29 DIAGNOSIS — F17200 Nicotine dependence, unspecified, uncomplicated: Secondary | ICD-10-CM | POA: Diagnosis not present

## 2017-09-29 DIAGNOSIS — O368390 Maternal care for abnormalities of the fetal heart rate or rhythm, unspecified trimester, not applicable or unspecified: Secondary | ICD-10-CM

## 2017-09-29 LAB — URINALYSIS, ROUTINE W REFLEX MICROSCOPIC
Bilirubin Urine: NEGATIVE
GLUCOSE, UA: NEGATIVE mg/dL
Hgb urine dipstick: NEGATIVE
Ketones, ur: NEGATIVE mg/dL
LEUKOCYTES UA: NEGATIVE
NITRITE: NEGATIVE
PH: 8 (ref 5.0–8.0)
Protein, ur: 100 mg/dL — AB
SPECIFIC GRAVITY, URINE: 1.013 (ref 1.005–1.030)

## 2017-09-29 LAB — WET PREP, GENITAL
SPERM: NONE SEEN
Trich, Wet Prep: NONE SEEN
Yeast Wet Prep HPF POC: NONE SEEN

## 2017-09-29 MED ORDER — CYCLOBENZAPRINE HCL 10 MG PO TABS
10.0000 mg | ORAL_TABLET | Freq: Three times a day (TID) | ORAL | Status: DC | PRN
  Administered 2017-09-29: 10 mg via ORAL
  Filled 2017-09-29: qty 1

## 2017-09-29 MED ORDER — ACETAMINOPHEN 500 MG PO TABS
1000.0000 mg | ORAL_TABLET | Freq: Four times a day (QID) | ORAL | Status: DC | PRN
  Administered 2017-09-29: 1000 mg via ORAL
  Filled 2017-09-29: qty 2

## 2017-09-29 MED ORDER — CYCLOBENZAPRINE HCL 10 MG PO TABS: 10.0000 mg | ORAL_TABLET | Freq: Three times a day (TID) | ORAL | 0 refills | Status: DC | PRN

## 2017-09-29 NOTE — MAU Provider Note (Signed)
History     CSN: [id number]  Arrival date and time: 09/29/17 1100   First Provider Initiated Contact with Patient 09/29/17 1144      Chief Complaint  Patient presents with  . Abdominal Pain  . Back Pain   [id number] @29 .2 wks here with LAP and back pain. Pain started around 4am today. Describes as cramping and intermittent. She is unsure if having ctx. Denies VB and LOF. Reports good FM. Pain is worse with standing and lifting "totes". 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 She has not tried anything for the pain. Denies urinary sx. Had some dizziness and weakness today. She admits to not eating anything today. Had a small amt of water.   OB History    Gravida  8   Para  4   Term  4   Preterm  0   AB  3   Living  4     SAB  1   TAB  2   Ectopic  0   Multiple  0   Live Births  4           Past Medical History:  Diagnosis Date  . Anemia    first and sec pregnancies  . Arrhythmia   . Chlamydia   . Fibroid   . Gestational diabetes    diet controlled  . Gonorrhea   . Headache(784.0)   . Hemorrhoids   . Hx of migraine headaches   . Leiomyoma of uterus in pregnancy     Past Surgical History:  Procedure Laterality Date  . INDUCED ABORTION  March  2 ,2013  . WISDOM TOOTH EXTRACTION      Family History  Problem Relation Age of Onset  . Other Neg Hx   . Alcohol abuse Neg Hx   . Arthritis Neg Hx   . Asthma Neg Hx   . Birth defects Neg Hx   . Cancer Neg Hx   . COPD Neg Hx   . Depression Neg Hx   . Diabetes Neg Hx   . Drug abuse Neg Hx   . Early death Neg Hx   . Heart disease Neg Hx   . Hearing loss Neg Hx   . Hyperlipidemia Neg Hx   . Hypertension Neg Hx   . Kidney disease Neg Hx   . Learning disabilities Neg Hx   . Mental illness Neg Hx   . Mental retardation Neg Hx   . Miscarriages / Stillbirths Neg Hx   . Stroke Neg Hx   . Vision loss Neg Hx     Social History   Tobacco Use  . Smoking status: Current Every Day  Smoker  . Smokeless tobacco: Never Used  Substance Use Topics  . Alcohol use: No  . Drug use: Yes    Frequency: 14.0 times per week    Types: Marijuana    Allergies: No Known Allergies  Medications Prior to Admission  Medication Sig Dispense Refill Last Dose  . ondansetron (ZOFRAN) 4 MG tablet Take 1 tablet (4 mg total) by mouth every 8 (eight) hours as needed for nausea or vomiting. 20 tablet 0 prn  . Prenat w/o A Vit-FeFum-FePo-FA (CONCEPT OB) 130-92.4-1 MG CAPS Take 1 capsule by mouth daily. 30 capsule 11 Past Month at Unknown time  . promethazine (PHENERGAN) 25 MG tablet Take 0.5-1 tablets (12.5-25 mg total) by mouth at bedtime as needed. 30 tablet 2 Past Month at Unknown time  . cyclobenzaprine (  FLEXERIL) 5 MG tablet Take 1 tablet (5 mg total) 3 (three) times daily as needed by mouth for muscle spasms. (Patient not taking: Reported on 08/05/2017) 10 tablet 0 Not Taking  . docusate sodium (COLACE) 50 MG capsule Take 1 capsule (50 mg total) 2 (two) times daily as needed by mouth for mild constipation. (Patient not taking: Reported on 08/05/2017) 30 capsule 1 Not Taking  . metoCLOPramide (REGLAN) 10 MG tablet Take 1 tablet (10 mg total) by mouth 3 (three) times daily before meals. (Patient not taking: Reported on 08/05/2017) 60 tablet 2 Not Taking  . NIFEdipine (PROCARDIA-XL/ADALAT-CC/NIFEDICAL-XL) 30 MG 24 hr tablet Take 1 tablet (30 mg total) by mouth daily. Can increase to twice a day as needed for symptomatic contractions (Patient not taking: Reported on 09/29/2017) 30 tablet 2 Not Taking at Unknown time    Review of Systems  Gastrointestinal: Positive for abdominal pain. Negative for constipation, diarrhea, nausea and vomiting.  Musculoskeletal: Positive for back pain.   Physical Exam   Blood pressure 109/60, pulse 85, temperature 99 F (37.2 C), temperature source Oral, resp. rate 16, weight 186 lb 8 oz (84.6 kg), last menstrual period 03/10/2017, SpO2 100 %, unknown if currently  breastfeeding.  Physical Exam  Constitutional: She is oriented to person, place, and time. She appears well-developed and well-nourished. No distress.  HENT:  Head: Normocephalic and atraumatic.  Neck: Normal range of motion.  Cardiovascular: Normal rate.  Respiratory: Effort normal. No respiratory distress.  GI: Soft. She exhibits no distension. There is no tenderness.  gravid  Genitourinary:  Genitourinary Comments: SVE closed/thick  Musculoskeletal: Normal range of motion.  Neurological: She is alert and oriented to person, place, and time.  Skin: Skin is warm and dry.  Psychiatric: She has a normal mood and affect.  EFM: 150 bpm, mod variability, + accels, no decels Toco: none  Results for orders placed or performed during the hospital encounter of 09/29/17 (from the past 24 hour(s))  Urinalysis, Routine w reflex microscopic     Status: Abnormal   Collection Time: 09/29/17 11:19 AM  Result Value Ref Range   Color, Urine YELLOW YELLOW   APPearance CLEAR CLEAR   Specific Gravity, Urine 1.013 1.005 - 1.030   pH 8.0 5.0 - 8.0   Glucose, UA NEGATIVE NEGATIVE mg/dL   Hgb urine dipstick NEGATIVE NEGATIVE   Bilirubin Urine NEGATIVE NEGATIVE   Ketones, ur NEGATIVE NEGATIVE mg/dL   Protein, ur 100 (A) NEGATIVE mg/dL   Nitrite NEGATIVE NEGATIVE   Leukocytes, UA NEGATIVE NEGATIVE   RBC / HPF 0-5 0 - 5 RBC/hpf   WBC, UA 0-5 0 - 5 WBC/hpf   Bacteria, UA RARE (A) NONE SEEN   Squamous Epithelial / LPF 0-5 (A) NONE SEEN   WBC Clumps PRESENT    Mucus PRESENT    Hyaline Casts, UA PRESENT   Wet prep, genital     Status: Abnormal   Collection Time: 09/29/17 11:35 AM  Result Value Ref Range   Yeast Wet Prep HPF POC NONE SEEN NONE SEEN   Trich, Wet Prep NONE SEEN NONE SEEN   Clue Cells Wet Prep HPF POC PRESENT (A) NONE SEEN   WBC, Wet Prep HPF POC FEW (A) NONE SEEN   Sperm NONE SEEN    MAU Course  Procedures Tylenol Flexeril Heating pad  MDM Labs ordered and reviewed. No  evidence of UTI or PTL. NST reactive initially then prolonged FHR decel x1 w/ctx >BPP ordered. BPP 6/8, off for breathing, AFI  16cm. Pt feeling better after meds. Stable for discharge home.   Assessment and Plan   1. Back pain affecting pregnancy in third trimester   2. Fetal heart rate decelerations affecting management of mother   3. [redacted] weeks gestation of pregnancy    Discharge home Follow up in OB office as scheduled Rx Flexeril PTL precautions Work note>no lifting more than 20 lbs  Allergies as of 09/29/2017   No Known Allergies     Medication List    STOP taking these medications   docusate sodium 50 MG capsule Commonly known as:  COLACE   metoCLOPramide 10 MG tablet Commonly known as:  REGLAN   NIFEdipine 30 MG 24 hr tablet Commonly known as:  PROCARDIA-XL/ADALAT-CC/NIFEDICAL-XL     TAKE these medications   CONCEPT OB 130-92.4-1 MG Caps Take 1 capsule by mouth daily.   cyclobenzaprine 10 MG tablet Commonly known as:  FLEXERIL Take 1 tablet (10 mg total) by mouth 3 (three) times daily as needed for muscle spasms. What changed:    medication strength  how much to take   ondansetron 4 MG tablet Commonly known as:  ZOFRAN Take 1 tablet (4 mg total) by mouth every 8 (eight) hours as needed for nausea or vomiting.   promethazine 25 MG tablet Commonly known as:  PHENERGAN Take 0.5-1 tablets (12.5-25 mg total) by mouth at bedtime as needed.      Julianne Handler, CNM 09/29/2017, 12:03 PM

## 2017-09-29 NOTE — Discharge Instructions (Signed)
Braxton Hicks Contractions °Contractions of the uterus can occur throughout pregnancy, but they are not always a sign that you are in labor. You may have practice contractions called Braxton Hicks contractions. These false labor contractions are sometimes confused with true labor. °What are Braxton Hicks contractions? °Braxton Hicks contractions are tightening movements that occur in the muscles of the uterus before labor. Unlike true labor contractions, these contractions do not result in opening (dilation) and thinning of the cervix. Toward the end of pregnancy (32-34 weeks), Braxton Hicks contractions can happen more often and may become stronger. These contractions are sometimes difficult to tell apart from true labor because they can be very uncomfortable. You should not feel embarrassed if you go to the hospital with false labor. °Sometimes, the only way to tell if you are in true labor is for your health care provider to look for changes in the cervix. The health care provider will do a physical exam and may monitor your contractions. If you are not in true labor, the exam should show that your cervix is not dilating and your water has not broken. °If there are other health problems associated with your pregnancy, it is completely safe for you to be sent home with false labor. You may continue to have Braxton Hicks contractions until you go into true labor. °How to tell the difference between true labor and false labor °True labor °· Contractions last 30-70 seconds. °· Contractions become very regular. °· Discomfort is usually felt in the top of the uterus, and it spreads to the lower abdomen and low back. °· Contractions do not go away with walking. °· Contractions usually become more intense and increase in frequency. °· The cervix dilates and gets thinner. °False labor °· Contractions are usually shorter and not as strong as true labor contractions. °· Contractions are usually irregular. °· Contractions  are often felt in the front of the lower abdomen and in the groin. °· Contractions may go away when you walk around or change positions while lying down. °· Contractions get weaker and are shorter-lasting as time goes on. °· The cervix usually does not dilate or become thin. °Follow these instructions at home: °· Take over-the-counter and prescription medicines only as told by your health care provider. °· Keep up with your usual exercises and follow other instructions from your health care provider. °· Eat and drink lightly if you think you are going into labor. °· If Braxton Hicks contractions are making you uncomfortable: °? Change your position from lying down or resting to walking, or change from walking to resting. °? Sit and rest in a tub of warm water. °? Drink enough fluid to keep your urine pale yellow. Dehydration may cause these contractions. °? Do slow and deep breathing several times an hour. °· Keep all follow-up prenatal visits as told by your health care provider. This is important. °Contact a health care provider if: °· You have a fever. °· You have continuous pain in your abdomen. °Get help right away if: °· Your contractions become stronger, more regular, and closer together. °· You have fluid leaking or gushing from your vagina. °· You pass blood-tinged mucus (bloody show). °· You have bleeding from your vagina. °· You have low back pain that you never had before. °· You feel your baby’s head pushing down and causing pelvic pressure. °· Your baby is not moving inside you as much as it used to. °Summary °· Contractions that occur before labor are called Braxton   Hicks contractions, false labor, or practice contractions.  Braxton Hicks contractions are usually shorter, weaker, farther apart, and less regular than true labor contractions. True labor contractions usually become progressively stronger and regular and they become more frequent.  Manage discomfort from [facility] contractions by  changing position, resting in a warm bath, drinking plenty of water, or practicing deep breathing. This information is not intended to replace advice given to you by your health care provider. Make sure you discuss any questions you have with your health care provider. Document Released: 10/31/2016 Document Revised: 10/31/2016 Document Reviewed: 10/31/2016 Elsevier Interactive Patient Education  2018 [place]. Back Pain in Pregnancy Back pain during pregnancy is common. Back pain may be caused by several factors that are related to changes during your pregnancy. Follow these instructions at home: Managing pain, stiffness, and swelling  If directed, apply ice for sudden (acute) back pain. ? Put ice in a plastic bag. ? Place a towel between your skin and the bag. ? Leave the ice on for 20 minutes, 2-3 times per day.  If directed, apply heat to the affected area before you exercise: ? Place a towel between your skin and the heat pack or heating pad. ? Leave the heat on for 20-30 minutes. ? Remove the heat if your skin turns bright red. This is especially important if you are unable to feel pain, heat, or cold. You may have a greater risk of getting burned. Activity  Exercise as told by your health care provider. Exercising is the best way to prevent or manage back pain.  Listen to your body when lifting. If lifting hurts, ask for help or bend your knees. This uses your leg muscles instead of your back muscles.  Squat down when picking up something from the floor. Do not bend over.  Only use bed rest as told by your health care provider. Bed rest should only be used for the most severe episodes of back pain. Standing, Sitting, and Lying Down  Do not stand in one place for long periods of time.  Use good posture when sitting. Make sure your head rests over your shoulders and is not hanging forward. Use a pillow on your lower back if necessary.  Try sleeping on your side, preferably  the left side, with a pillow or two between your legs. If you are sore after a night's rest, your bed may be too soft. A firm mattress may provide more support for your back during pregnancy. General instructions  Do not wear high heels.  Eat a healthy diet. Try to gain weight within your health care provider's recommendations.  Use a maternity girdle, elastic sling, or back brace as told by your health care provider.  Take over-the-counter and prescription medicines only as told by your health care provider.  Keep all follow-up visits as told by your health care provider. This is important. This includes any visits with any specialists, such as a physical therapist. Contact a health care provider if:  Your back pain interferes with your daily activities.  You have increasing pain in other parts of your body. Get help right away if:  You develop numbness, tingling, weakness, or problems with the use of your arms or legs.  You develop severe back pain that is not controlled with medicine.  You have a sudden change in bowel or bladder control.  You develop shortness of breath, dizziness, or you faint.  You develop nausea, vomiting, or sweating.  You have back  pain that is a rhythmic, cramping pain similar to labor pains. Labor pain is usually 1-2 minutes apart, lasts for about 1 minute, and involves a bearing down feeling or pressure in your pelvis.  You have back pain and your water breaks or you have vaginal bleeding.  You have back pain or numbness that travels down your leg.  Your back pain developed after you fell.  You develop pain on one side of your back.  You see blood in your urine.  You develop skin blisters in the area of your back pain. This information is not intended to replace advice given to you by your health care provider. Make sure you discuss any questions you have with your health care provider. Document Released: 09/25/2005 Document Revised: 11/23/2015  Document Reviewed: 03/01/2015 Elsevier Interactive Patient Education  [employer].

## 2017-09-29 NOTE — MAU Note (Signed)
Been having pain in stomach and low back. Started this morning. No bleeding or leaking. No hx of PTL or PTD.  Denies any GI or GU complaints.

## 2017-09-30 ENCOUNTER — Other Ambulatory Visit: Payer: Self-pay | Admitting: *Deleted

## 2017-09-30 DIAGNOSIS — O0993 Supervision of high risk pregnancy, unspecified, third trimester: Secondary | ICD-10-CM

## 2017-09-30 LAB — GC/CHLAMYDIA PROBE AMP (~~LOC~~) NOT AT ARMC
Chlamydia: NEGATIVE
Neisseria Gonorrhea: NEGATIVE

## 2017-10-01 ENCOUNTER — Telehealth: Payer: Self-pay | Admitting: Family Medicine

## 2017-10-01 ENCOUNTER — Other Ambulatory Visit: Payer: 59

## 2017-10-01 NOTE — Telephone Encounter (Signed)
Patient called to say she could not come in the morning times because she has to work.

## 2017-10-02 ENCOUNTER — Encounter: Payer: 59 | Admitting: Obstetrics and Gynecology

## 2017-10-06 ENCOUNTER — Encounter: Payer: 59 | Admitting: Family Medicine

## 2017-10-13 ENCOUNTER — Ambulatory Visit (INDEPENDENT_AMBULATORY_CARE_PROVIDER_SITE_OTHER): Payer: 59 | Admitting: Obstetrics and Gynecology

## 2017-10-13 ENCOUNTER — Encounter: Payer: Self-pay | Admitting: Obstetrics and Gynecology

## 2017-10-13 ENCOUNTER — Encounter: Payer: 59 | Admitting: Obstetrics & Gynecology

## 2017-10-13 VITALS — BP 120/66 | HR 82 | Wt 183.3 lb

## 2017-10-13 DIAGNOSIS — O26893 Other specified pregnancy related conditions, third trimester: Secondary | ICD-10-CM

## 2017-10-13 DIAGNOSIS — O0990 Supervision of high risk pregnancy, unspecified, unspecified trimester: Secondary | ICD-10-CM

## 2017-10-13 DIAGNOSIS — R300 Dysuria: Secondary | ICD-10-CM

## 2017-10-13 DIAGNOSIS — O26899 Other specified pregnancy related conditions, unspecified trimester: Secondary | ICD-10-CM | POA: Insufficient documentation

## 2017-10-13 DIAGNOSIS — O0993 Supervision of high risk pregnancy, unspecified, third trimester: Secondary | ICD-10-CM

## 2017-10-13 NOTE — Patient Instructions (Signed)
Third Trimester of Pregnancy The third trimester is from week 28 through week 40 (months 7 through 9). The third trimester is a time when the unborn baby (fetus) is growing rapidly. At the end of the ninth month, the fetus is about 20 inches in length and weighs 6-10 pounds. Body changes during your third trimester Your body will continue to go through many changes during pregnancy. The changes vary from woman to woman. During the third trimester:  Your weight will continue to increase. You can expect to gain 25-35 pounds (11-16 kg) by the end of the pregnancy.  You may begin to get stretch marks on your hips, abdomen, and breasts.  You may urinate more often because the fetus is moving lower into your pelvis and pressing on your bladder.  You may develop or continue to have heartburn. This is caused by increased hormones that slow down muscles in the digestive tract.  You may develop or continue to have constipation because increased hormones slow digestion and cause the muscles that push waste through your intestines to relax.  You may develop hemorrhoids. These are swollen veins (varicose veins) in the rectum that can itch or be painful.  You may develop swollen, bulging veins (varicose veins) in your legs.  You may have increased body aches in the pelvis, back, or thighs. This is due to weight gain and increased hormones that are relaxing your joints.  You may have changes in your hair. These can include thickening of your hair, rapid growth, and changes in texture. Some women also have hair loss during or after pregnancy, or hair that feels dry or thin. Your hair will most likely return to normal after your baby is born.  Your breasts will continue to grow and they will continue to become tender. A yellow fluid (colostrum) may leak from your breasts. This is the first milk you are producing for your baby.  Your belly button may stick out.  You may notice more swelling in your hands,  face, or ankles.  You may have increased tingling or numbness in your hands, arms, and legs. The skin on your belly may also feel numb.  You may feel short of breath because of your expanding uterus.  You may have more problems sleeping. This can be caused by the size of your belly, increased need to urinate, and an increase in your body's metabolism.  You may notice the fetus "dropping," or moving lower in your abdomen (lightening).  You may have increased vaginal discharge.  You may notice your joints feel loose and you may have pain around your pelvic bone.  What to expect at prenatal visits You will have prenatal exams every 2 weeks until week 36. Then you will have weekly prenatal exams. During a routine prenatal visit:  You will be weighed to make sure you and the baby are growing normally.  Your blood pressure will be taken.  Your abdomen will be measured to track your baby's growth.  The fetal heartbeat will be listened to.  Any test results from the previous visit will be discussed.  You may have a cervical check near your due date to see if your cervix has softened or thinned (effaced).  You will be tested for Group B streptococcus. This happens between 35 and 37 weeks.  Your health care provider may ask you:  What your birth plan is.  How you are feeling.  If you are feeling the baby move.  If you have had   any abnormal symptoms, such as leaking fluid, bleeding, severe headaches, or abdominal cramping.  If you are using any tobacco products, including cigarettes, chewing tobacco, and electronic cigarettes.  If you have any questions.  Other tests or screenings that may be performed during your third trimester include:  Blood tests that check for low iron levels (anemia).  Fetal testing to check the health, activity level, and growth of the fetus. Testing is done if you have certain medical conditions or if there are problems during the  pregnancy.  Nonstress test (NST). This test checks the health of your baby to make sure there are no signs of problems, such as the baby not getting enough oxygen. During this test, a belt is placed around your belly. The baby is made to move, and its heart rate is monitored during movement.  What is false labor? False labor is a condition in which you feel small, irregular tightenings of the muscles in the womb (contractions) that usually go away with rest, changing position, or drinking water. These are called Braxton Hicks contractions. Contractions may last for hours, days, or even weeks before true labor sets in. If contractions come at regular intervals, become more frequent, increase in intensity, or become painful, you should see your health care provider. What are the signs of labor?  Abdominal cramps.  Regular contractions that start at 10 minutes apart and become stronger and more frequent with time.  Contractions that start on the top of the uterus and spread down to the lower abdomen and back.  Increased pelvic pressure and dull back pain.  A watery or bloody mucus discharge that comes from the vagina.  Leaking of amniotic fluid. This is also known as your "water breaking." It could be a slow trickle or a gush. Let your health care provider know if it has a color or strange odor. If you have any of these signs, call your health care provider right away, even if it is before your due date. Follow these instructions at home: Medicines  Follow your health care provider's instructions regarding medicine use. Specific medicines may be either safe or unsafe to take during pregnancy.  Take a prenatal vitamin that contains at least 600 micrograms (mcg) of folic acid.  If you develop constipation, try taking a stool softener if your health care provider approves. Eating and drinking  Eat a balanced diet that includes fresh fruits and vegetables, whole grains, good sources of protein  such as meat, eggs, or tofu, and low-fat dairy. Your health care provider will help you determine the amount of weight gain that is right for you.  Avoid raw meat and uncooked cheese. These carry germs that can cause birth defects in the baby.  If you have low calcium intake from food, talk to your health care provider about whether you should take a daily calcium supplement.  Eat four or five small meals rather than three large meals a day.  Limit foods that are high in fat and processed sugars, such as fried and sweet foods.  To prevent constipation: ? Drink enough fluid to keep your urine clear or pale yellow. ? Eat foods that are high in fiber, such as fresh fruits and vegetables, whole grains, and beans. Activity  Exercise only as directed by your health care provider. Most women can continue their usual exercise routine during pregnancy. Try to exercise for 30 minutes at least 5 days a week. Stop exercising if you experience uterine contractions.  Avoid heavy   lifting.  Do not exercise in extreme heat or humidity, or at high altitudes.  Wear low-heel, comfortable shoes.  Practice good posture.  You may continue to have sex unless your health care provider tells you otherwise. Relieving pain and discomfort  Take frequent breaks and rest with your legs elevated if you have leg cramps or low back pain.  Take warm sitz baths to soothe any pain or discomfort caused by hemorrhoids. Use hemorrhoid cream if your health care provider approves.  Wear a good support bra to prevent discomfort from breast tenderness.  If you develop varicose veins: ? Wear support pantyhose or compression stockings as told by your healthcare provider. ? Elevate your feet for 15 minutes, 3-4 times a day. Prenatal care  Write down your questions. Take them to your prenatal visits.  Keep all your prenatal visits as told by your health care provider. This is important. Safety  Wear your seat belt at  all times when driving.  Make a list of emergency phone numbers, including numbers for family, friends, the hospital, and police and fire departments. General instructions  Avoid cat litter boxes and soil used by cats. These carry germs that can cause birth defects in the baby. If you have a cat, ask someone to clean the litter box for you.  Do not travel far distances unless it is absolutely necessary and only with the approval of your health care provider.  Do not use hot tubs, steam rooms, or saunas.  Do not drink alcohol.  Do not use any products that contain nicotine or tobacco, such as cigarettes and e-cigarettes. If you need help quitting, ask your health care provider.  Do not use any medicinal herbs or unprescribed drugs. These chemicals affect the formation and growth of the baby.  Do not douche or use tampons or scented sanitary pads.  Do not cross your legs for long periods of time.  To prepare for the arrival of your baby: ? Take prenatal classes to understand, practice, and ask questions about labor and delivery. ? Make a trial run to the hospital. ? Visit the hospital and tour the maternity area. ? Arrange for maternity or paternity leave through employers. ? Arrange for family and friends to take care of pets while you are in the hospital. ? Purchase a rear-facing car seat and make sure you know how to install it in your car. ? Pack your hospital bag. ? Prepare the baby's nursery. Make sure to remove all pillows and stuffed animals from the baby's crib to prevent suffocation.  Visit your dentist if you have not gone during your pregnancy. Use a soft toothbrush to brush your teeth and be gentle when you floss. Contact a health care provider if:  You are unsure if you are in labor or if your water has broken.  You become dizzy.  You have mild pelvic cramps, pelvic pressure, or nagging pain in your abdominal area.  You have lower back pain.  You have persistent  nausea, vomiting, or diarrhea.  You have an unusual or bad smelling vaginal discharge.  You have pain when you urinate. Get help right away if:  Your water breaks before 37 weeks.  You have regular contractions less than 5 minutes apart before 37 weeks.  You have a fever.  You are leaking fluid from your vagina.  You have spotting or bleeding from your vagina.  You have severe abdominal pain or cramping.  You have rapid weight loss or weight gain.    You have shortness of breath with chest pain.  You notice sudden or extreme swelling of your face, hands, ankles, feet, or legs.  Your baby makes fewer than 10 movements in 2 hours.  You have severe headaches that do not go away when you take medicine.  You have vision changes. Summary  The third trimester is from week 28 through week 40, months 7 through 9. The third trimester is a time when the unborn baby (fetus) is growing rapidly.  During the third trimester, your discomfort may increase as you and your baby continue to gain weight. You may have abdominal, leg, and back pain, sleeping problems, and an increased need to urinate.  During the third trimester your breasts will keep growing and they will continue to become tender. A yellow fluid (colostrum) may leak from your breasts. This is the first milk you are producing for your baby.  False labor is a condition in which you feel small, irregular tightenings of the muscles in the womb (contractions) that eventually go away. These are called Braxton Hicks contractions. Contractions may last for hours, days, or even weeks before true labor sets in.  Signs of labor can include: abdominal cramps; regular contractions that start at 10 minutes apart and become stronger and more frequent with time; watery or bloody mucus discharge that comes from the vagina; increased pelvic pressure and dull back pain; and leaking of amniotic fluid. This information is not intended to replace advice  given to you by your health care provider. Make sure you discuss any questions you have with your health care provider. Document Released: 06/11/2001 Document Revised: 11/23/2015 Document Reviewed: 08/18/2012 Elsevier Interactive Patient Education  2017 [employer]  

## 2017-10-13 NOTE — Progress Notes (Signed)
Subjective:  Sonia Allen is a 32 y.o. [id number] at [redacted]w[redacted]d being seen today for ongoing prenatal care.  She is currently monitored for the following issues for this high-risk pregnancy and has HEADACHE; Uterine fibroids affecting pregnancy; Anemia; Supervision of high risk pregnancy, antepartum; and Marijuana use on their problem list.  Patient reports occasional contractions.  Contractions: Irritability. Vag. Bleeding: None.  Movement: Present. Denies leaking of fluid.   The following portions of the patient's history were reviewed and updated as appropriate: allergies, current medications, past family history, past medical history, past social history, past surgical history and problem list. Problem list updated.  Objective:   Vitals:   10/13/17 1627  BP: 120/66  Pulse: 82  Weight: 183 lb 4.8 oz (83.1 kg)    Fetal Status: Fetal Heart Rate (bpm): 145   Movement: Present     General:  Alert, oriented and cooperative. Patient is in no acute distress.  Skin: Skin is warm and dry. No rash noted.   Cardiovascular: Normal heart rate noted  Respiratory: Normal respiratory effort, no problems with respiration noted  Abdomen: Soft, gravid, appropriate for gestational age. Pain/Pressure: Present     Pelvic:  Cervical exam deferred        Extremities: Normal range of motion.  Edema: Trace  Mental Status: Normal mood and affect. Normal behavior. Normal judgment and thought content.   Urinalysis:      Assessment and Plan:  Pregnancy: [id number] at [redacted]w[redacted]d  1. Supervision of high risk pregnancy, antepartum Stable - CBC - HIV antibody - RPR - Glucose tolerance, 1 hour  2. Dysuria  Will check Urine culture  3. Preterm uterine contractions Pt taking Procardia as needed  Does not appear to be regular Preterm labor symptoms and general obstetric precautions including but not limited to vaginal bleeding, contractions, leaking of fluid and fetal movement were reviewed in detail with the  patient. Please refer to After Visit Summary for other counseling recommendations.  Return in about 2 weeks (around 10/27/2017) for OB visit.   Chancy Milroy, MD

## 2017-10-14 ENCOUNTER — Inpatient Hospital Stay (HOSPITAL_COMMUNITY)
Admission: AD | Admit: 2017-10-14 | Discharge: 2017-10-14 | Disposition: A | Discharge status: Home / Self Care | Payer: 59 | Source: Ambulatory Visit | Attending: Family Medicine | Admitting: Family Medicine

## 2017-10-14 ENCOUNTER — Encounter (HOSPITAL_COMMUNITY): Payer: Self-pay | Admitting: Student

## 2017-10-14 ENCOUNTER — Other Ambulatory Visit: Payer: Self-pay

## 2017-10-14 DIAGNOSIS — O99013 Anemia complicating pregnancy, third trimester: Secondary | ICD-10-CM | POA: Diagnosis not present

## 2017-10-14 DIAGNOSIS — Z87891 Personal history of nicotine dependence: Secondary | ICD-10-CM | POA: Diagnosis not present

## 2017-10-14 DIAGNOSIS — O26893 Other specified pregnancy related conditions, third trimester: Secondary | ICD-10-CM | POA: Diagnosis not present

## 2017-10-14 DIAGNOSIS — Z91040 Latex allergy status: Secondary | ICD-10-CM | POA: Diagnosis not present

## 2017-10-14 DIAGNOSIS — O9989 Other specified diseases and conditions complicating pregnancy, childbirth and the puerperium: Secondary | ICD-10-CM | POA: Diagnosis not present

## 2017-10-14 DIAGNOSIS — H538 Other visual disturbances: Secondary | ICD-10-CM | POA: Diagnosis present

## 2017-10-14 DIAGNOSIS — Z9889 Other specified postprocedural states: Secondary | ICD-10-CM | POA: Insufficient documentation

## 2017-10-14 DIAGNOSIS — Z3A31 31 weeks gestation of pregnancy: Secondary | ICD-10-CM | POA: Diagnosis not present

## 2017-10-14 DIAGNOSIS — M545 Low back pain: Secondary | ICD-10-CM | POA: Diagnosis present

## 2017-10-14 DIAGNOSIS — M549 Dorsalgia, unspecified: Secondary | ICD-10-CM

## 2017-10-14 DIAGNOSIS — R5383 Other fatigue: Secondary | ICD-10-CM | POA: Diagnosis present

## 2017-10-14 LAB — CBC
HEMATOCRIT: 26.6 % — AB (ref 34.0–46.6)
Hemoglobin: 8.6 g/dL — ABNORMAL LOW (ref 11.1–15.9)
MCH: 26.5 pg — ABNORMAL LOW (ref 26.6–33.0)
MCHC: 32.3 g/dL (ref 31.5–35.7)
MCV: 82 fL (ref 79–97)
Platelets: 296 10*3/uL (ref 150–379)
RBC: 3.24 x10E6/uL — ABNORMAL LOW (ref 3.77–5.28)
RDW: 14 % (ref 12.3–15.4)
WBC: 5.5 10*3/uL (ref 3.4–10.8)

## 2017-10-14 LAB — URINALYSIS, ROUTINE W REFLEX MICROSCOPIC
Bilirubin Urine: NEGATIVE
Glucose, UA: NEGATIVE mg/dL
Hgb urine dipstick: NEGATIVE
Ketones, ur: NEGATIVE mg/dL
Nitrite: NEGATIVE
Protein, ur: 30 mg/dL — AB
SPECIFIC GRAVITY, URINE: 1.023 (ref 1.005–1.030)
pH: 6 (ref 5.0–8.0)

## 2017-10-14 LAB — GLUCOSE TOLERANCE, 1 HOUR: GLUCOSE, 1HR PP: 141 mg/dL (ref 65–199)

## 2017-10-14 LAB — RPR: RPR: NONREACTIVE

## 2017-10-14 LAB — HIV ANTIBODY (ROUTINE TESTING W REFLEX): HIV SCREEN 4TH GENERATION: NONREACTIVE

## 2017-10-14 MED ORDER — SODIUM CHLORIDE 0.9 % IV SOLN
510.0000 mg | Freq: Once | INTRAVENOUS | Status: AC
  Administered 2017-10-14: 510 mg via INTRAVENOUS
  Filled 2017-10-14: qty 17

## 2017-10-14 MED ORDER — ACETAMINOPHEN 500 MG PO TABS
1000.0000 mg | ORAL_TABLET | Freq: Once | ORAL | Status: AC
  Administered 2017-10-14: 1000 mg via ORAL
  Filled 2017-10-14: qty 2

## 2017-10-14 MED ORDER — SODIUM CHLORIDE 0.9 % IV SOLN
INTRAVENOUS | Status: DC
  Administered 2017-10-14: 11:00:00 via INTRAVENOUS

## 2017-10-14 NOTE — Discharge Instructions (Signed)
Anemia Anemia is a condition in which you do not have enough red blood cells or hemoglobin. Hemoglobin is a substance in red blood cells that carries oxygen. When you do not have enough red blood cells or hemoglobin (are anemic), your body cannot get enough oxygen and your organs may not work properly. As a result, you may feel very tired or have other problems. What are the causes? Common causes of anemia include:  Excessive bleeding. Anemia can be caused by excessive bleeding inside or outside the body, including bleeding from the intestine or from periods in women.  Poor nutrition.  Long-lasting (chronic) kidney, thyroid, and liver disease.  Bone marrow disorders.  Cancer and treatments for cancer.  HIV (human immunodeficiency virus) and AIDS (acquired immunodeficiency syndrome).  Treatments for HIV and AIDS.  Spleen problems.  Blood disorders.  Infections, medicines, and autoimmune disorders that destroy red blood cells.  What are the signs or symptoms? Symptoms of this condition include:  Minor weakness.  Dizziness.  Headache.  Feeling heartbeats that are irregular or faster than normal (palpitations).  Shortness of breath, especially with exercise.  Paleness.  Cold sensitivity.  Indigestion.  Nausea.  Difficulty sleeping.  Difficulty concentrating.  Symptoms may occur suddenly or develop slowly. If your anemia is mild, you may not have symptoms. How is this diagnosed? This condition is diagnosed based on:  Blood tests.  Your medical history.  A physical exam.  Bone marrow biopsy.  Your health care provider may also check your stool (feces) for blood and may do additional testing to look for the cause of your bleeding. You may also have other tests, including:  Imaging tests, such as a CT scan or MRI.  Endoscopy.  Colonoscopy.  How is this treated? Treatment for this condition depends on the cause. If you continue to lose a lot of blood,  you may need to be treated at a hospital. Treatment may include:  Taking supplements of iron, vitamin T02, or folic acid.  Taking a hormone medicine (erythropoietin) that can help to stimulate red blood cell growth.  Having a blood transfusion. This may be needed if you lose a lot of blood.  Making changes to your diet.  Having surgery to remove your spleen.  Follow these instructions at home:  Take over-the-counter and prescription medicines only as told by your health care provider.  Take supplements only as told by your health care provider.  Follow any diet instructions that you were given.  Keep all follow-up visits as told by your health care provider. This is important. Contact a health care provider if:  You develop new bleeding anywhere in the body. Get help right away if:  You are very weak.  You are short of breath.  You have pain in your abdomen or chest.  You are dizzy or feel faint.  You have trouble concentrating.  You have bloody or black, tarry stools.  You vomit repeatedly or you vomit up blood. Summary  Anemia is a condition in which you do not have enough red blood cells or enough of a substance in your red blood cells that carries oxygen (hemoglobin).  Symptoms may occur suddenly or develop slowly.  If your anemia is mild, you may not have symptoms.  This condition is diagnosed with blood tests as well as a medical history and physical exam. Other tests may be needed.  Treatment for this condition depends on the cause of the anemia. This information is not intended to replace advice  given to you by your health care provider. Make sure you discuss any questions you have with your health care provider. Document Released: 07/25/2004 Document Revised: 07/19/2016 Document Reviewed: 07/19/2016 Elsevier Interactive Patient Education  2018 [employer].  Back Pain in Pregnancy Back pain during pregnancy is common. Back pain may be caused by several  factors that are related to changes during your pregnancy. Follow these instructions at home: Managing pain, stiffness, and swelling  If directed, apply ice for sudden (acute) back pain. ? Put ice in a plastic bag. ? Place a towel between your skin and the bag. ? Leave the ice on for 20 minutes, 2-3 times per day.  If directed, apply heat to the affected area before you exercise: ? Place a towel between your skin and the heat pack or heating pad. ? Leave the heat on for 20-30 minutes. ? Remove the heat if your skin turns bright red. This is especially important if you are unable to feel pain, heat, or cold. You may have a greater risk of getting burned. Activity  Exercise as told by your health care provider. Exercising is the best way to prevent or manage back pain.  Listen to your body when lifting. If lifting hurts, ask for help or bend your knees. This uses your leg muscles instead of your back muscles.  Squat down when picking up something from the floor. Do not bend over.  Only use bed rest as told by your health care provider. Bed rest should only be used for the most severe episodes of back pain. Standing, Sitting, and Lying Down  Do not stand in one place for long periods of time.  Use good posture when sitting. Make sure your head rests over your shoulders and is not hanging forward. Use a pillow on your lower back if necessary.  Try sleeping on your side, preferably the left side, with a pillow or two between your legs. If you are sore after a night's rest, your bed may be too soft. A firm mattress may provide more support for your back during pregnancy. General instructions  Do not wear high heels.  Eat a healthy diet. Try to gain weight within your health care provider's recommendations.  Use a maternity girdle, elastic sling, or back brace as told by your health care provider.  Take over-the-counter and prescription medicines only as told by your health care  provider.  Keep all follow-up visits as told by your health care provider. This is important. This includes any visits with any specialists, such as a physical therapist. Contact a health care provider if:  Your back pain interferes with your daily activities.  You have increasing pain in other parts of your body. Get help right away if:  You develop numbness, tingling, weakness, or problems with the use of your arms or legs.  You develop severe back pain that is not controlled with medicine.  You have a sudden change in bowel or bladder control.  You develop shortness of breath, dizziness, or you faint.  You develop nausea, vomiting, or sweating.  You have back pain that is a rhythmic, cramping pain similar to labor pains. Labor pain is usually 1-2 minutes apart, lasts for about 1 minute, and involves a bearing down feeling or pressure in your pelvis.  You have back pain and your water breaks or you have vaginal bleeding.  You have back pain or numbness that travels down your leg.  Your back pain developed after you fell.  You develop pain on one side of your back.  You see blood in your urine.  You develop skin blisters in the area of your back pain. This information is not intended to replace advice given to you by your health care provider. Make sure you discuss any questions you have with your health care provider. Document Released: 09/25/2005 Document Revised: 11/23/2015 Document Reviewed: 03/01/2015 Elsevier Interactive Patient Education  [employer].

## 2017-10-14 NOTE — MAU Provider Note (Signed)
History     CSN: [id number]  Arrival date and time: 10/14/17 6270   First Provider Initiated Contact with Patient 10/14/17 [phone]      Chief Complaint  Patient presents with  . Back Pain  . Fatigue  . Blurred Vision   HPI  Sonia Allen is a 32 y.o. [id number] at [redacted]w[redacted]d who presents with back pain and weakness. She reports low back pain that is worse when she's at work. 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 States the pain comes and goes. Has felt the pain 6 times this morning. Rates pain 5/10 when it occurs. Standing and walking makes pain worse. Denies abdominal pain, LOF, or vaginal bleeding. Also reports increase in pelvic pressure that is worse with working. States she was talked to about maternity support belt but has not purchased one.  Has felt tired and weak since yesterday. Thinks work is the source of these symptoms. Occasionally has blurred vision. Denies headache, visual disturbance, palpitations, CP, SOB, or epigastric pain.    OB History    Gravida  8   Para  4   Term  4   Preterm  0   AB  3   Living  4     SAB  1   TAB  2   Ectopic  0   Multiple  0   Live Births  4           Past Medical History:  Diagnosis Date  . Anemia    first and sec pregnancies  . Arrhythmia   . Chlamydia   . Gestational diabetes    diet controlled  . Gonorrhea   . Headache(784.0)   . Hemorrhoids   . Hx of migraine headaches   . Leiomyoma of uterus in pregnancy     Past Surgical History:  Procedure Laterality Date  . INDUCED ABORTION  March  2 ,2013  . WISDOM TOOTH EXTRACTION      Family History  Problem Relation Age of Onset  . Other Neg Hx   . Alcohol abuse Neg Hx   . Arthritis Neg Hx   . Asthma Neg Hx   . Birth defects Neg Hx   . Cancer Neg Hx   . COPD Neg Hx   . Depression Neg Hx   . Diabetes Neg Hx   . Drug abuse Neg Hx   . Early death Neg Hx   . Heart disease Neg Hx   . Hearing loss Neg Hx   .  Hyperlipidemia Neg Hx   . Hypertension Neg Hx   . Kidney disease Neg Hx   . Learning disabilities Neg Hx   . Mental illness Neg Hx   . Mental retardation Neg Hx   . Miscarriages / Stillbirths Neg Hx   . Stroke Neg Hx   . Vision loss Neg Hx     Social History   Tobacco Use  . Smoking status: Former Research scientist (life sciences)  . Smokeless tobacco: Never Used  Substance Use Topics  . Alcohol use: No  . Drug use: Yes    Frequency: 14.0 times per week    Types: Marijuana    Comment: Reports not currently smoking    Allergies:  Allergies  Allergen Reactions  . Latex Rash    Burning sensation    Medications Prior to Admission  Medication Sig Dispense Refill Last Dose  . cyclobenzaprine (FLEXERIL) 10 MG tablet Take 1 tablet (10 mg total)  by mouth 3 (three) times daily as needed for muscle spasms. 30 tablet 0 Past Month at Unknown time  . Prenat w/o A Vit-FeFum-FePo-FA (CONCEPT OB) 130-92.4-1 MG CAPS Take 1 capsule by mouth daily. 30 capsule 11 Past Month at Unknown time  . promethazine (PHENERGAN) 25 MG tablet Take 0.5-1 tablets (12.5-25 mg total) by mouth at bedtime as needed. 30 tablet 2 prn  . ondansetron (ZOFRAN) 4 MG tablet Take 1 tablet (4 mg total) by mouth every 8 (eight) hours as needed for nausea or vomiting. (Patient taking differently: Take 4 mg by mouth every 8 (eight) hours as needed for nausea or vomiting. ) 20 tablet 0 Taking    Review of Systems  Constitutional: Positive for fatigue.  Eyes: Negative for photophobia.  Respiratory: Negative for shortness of breath.   Cardiovascular: Negative for chest pain and palpitations.  Gastrointestinal: Negative.   Musculoskeletal: Positive for back pain.  Neurological: Positive for dizziness. Negative for syncope and headaches.   Physical Exam   Blood pressure (!) 102/59, pulse 64, temperature 98.5 F (36.9 C), temperature source Oral, resp. rate 18, height 5\' 4"  (1.626 m), weight 182 lb (82.6 kg), last menstrual period 03/10/2017, SpO2  100 %, unknown if currently breastfeeding.  Physical Exam  Nursing note and vitals reviewed. Constitutional: She is oriented to person, place, and time. She appears well-developed and well-nourished. No distress.  HENT:  Head: Normocephalic and atraumatic.  Eyes: Conjunctivae are normal. Right eye exhibits no discharge. Left eye exhibits no discharge. No scleral icterus.  Neck: Normal range of motion.  Cardiovascular: Normal rate, regular rhythm and normal heart sounds.  No murmur heard. Respiratory: Effort normal and breath sounds normal. No respiratory distress. She has no wheezes.  GI: Soft. There is no tenderness.  Genitourinary:  Genitourinary Comments: Dilation: Closed Effacement (%): Thick Cervical Position: Posterior Exam by:: Jorje Guild NP   Neurological: She is alert and oriented to person, place, and time.  Skin: Skin is warm and dry. She is not diaphoretic.  Psychiatric: She has a normal mood and affect. Her behavior is normal. Judgment and thought content normal.    MAU Course  Procedures Results for orders placed or performed during the hospital encounter of 10/14/17 (from the past 24 hour(s))  Urinalysis, Routine w reflex microscopic     Status: Abnormal   Collection Time: 10/14/17  9:01 AM  Result Value Ref Range   Color, Urine YELLOW YELLOW   APPearance CLOUDY (A) CLEAR   Specific Gravity, Urine 1.023 1.005 - 1.030   pH 6.0 5.0 - 8.0   Glucose, UA NEGATIVE NEGATIVE mg/dL   Hgb urine dipstick NEGATIVE NEGATIVE   Bilirubin Urine NEGATIVE NEGATIVE   Ketones, ur NEGATIVE NEGATIVE mg/dL   Protein, ur 30 (A) NEGATIVE mg/dL   Nitrite NEGATIVE NEGATIVE   Leukocytes, UA LARGE (A) NEGATIVE   RBC / HPF 0-5 0 - 5 RBC/hpf   WBC, UA 0-5 0 - 5 WBC/hpf   Bacteria, UA RARE (A) NONE SEEN   Squamous Epithelial / LPF 6-30 (A) NONE SEEN   Mucus PRESENT    Hyaline Casts, UA PRESENT     MDM NST:  Baseline: 145 bpm, Variability: Good {> 6 bpm), Accelerations: Reactive  and Decelerations: Absent Cervix closed. Encouraged use of maternity support belt, especially while at work. Will provide with work restriction note.   CBC done in office yesterday - hemoglobin 8.6. C/w Dr. Nehemiah Settle. Will give IV feraheme today & have patient receive outpatient infusion next week.  Assessment and Plan  A: 1. Anemia affecting pregnancy in third trimester   2. [redacted] weeks gestation of pregnancy   3. Back pain affecting pregnancy in third trimester    P: Discharge home Msg to [place] to manage feraheme infusion next week Maternity support belt Discussed reasons to return to [place] 10/14/2017, 9:39 AM

## 2017-10-14 NOTE — MAU Note (Signed)
Pt presents with c/o lower back pain that began yesterday @ work.  Pt also c/o "feeling weak" and having blurred vision.  Pt denies H/A.   Denies VB or LOF.  Reports +FM.

## 2017-10-15 LAB — CULTURE, OB URINE

## 2017-10-15 LAB — URINE CULTURE, OB REFLEX

## 2017-10-21 ENCOUNTER — Telehealth: Payer: Self-pay

## 2017-10-21 ENCOUNTER — Encounter: Payer: 59 | Admitting: Obstetrics and Gynecology

## 2017-10-21 NOTE — Telephone Encounter (Addendum)
-----   Message from Chancy Milroy, MD sent at 10/14/2017 10:47 AM EDT ----- Please start pt on iron supplement bid Also let pt know that she failed her Glucola and needs to schedule a 3 hr GTT ASAP Thanks Legrand Como  LM for pt that I am calling with results and that she also has an appt scheduled for today 4/23 at 1555 in which we can give her the results then.  I also stated that if she has any questions to call the office.

## 2017-10-27 ENCOUNTER — Telehealth: Payer: Self-pay | Admitting: General Practice

## 2017-10-27 NOTE — Telephone Encounter (Signed)
-----   Message from Jorje Guild, NP sent at 10/14/2017 11:37 AM EDT ----- Can you get patient set up for outpatient feraheme in a week. She had her first dose in MAU today per Dr. Nehemiah Settle.  Thanks!

## 2017-10-27 NOTE — Telephone Encounter (Signed)
Called patient & informed her of failed 1 hr gtt and need for 3 hr gtt. Also discussed need for second feraheme infusion. Told patient the scheduler is gone for the day so I will call her back tomorrow to set up iron infusion appt. Asked patient when she can come in for 3 hr gtt. Patient verbalized understanding & states she isn't sure with her work schedule. Told patient to think about it and we will discuss it tomorrow. Patient verbalized understanding & had no questions.

## 2017-10-28 ENCOUNTER — Inpatient Hospital Stay (HOSPITAL_COMMUNITY)
Admission: AD | Admit: 2017-10-28 | Discharge: 2017-10-28 | Disposition: A | Discharge status: Home / Self Care | Payer: 59 | Source: Ambulatory Visit | Attending: Obstetrics and Gynecology | Admitting: Obstetrics and Gynecology

## 2017-10-28 ENCOUNTER — Encounter (HOSPITAL_COMMUNITY): Payer: Self-pay

## 2017-10-28 DIAGNOSIS — O99891 Other specified diseases and conditions complicating pregnancy: Secondary | ICD-10-CM

## 2017-10-28 DIAGNOSIS — O26893 Other specified pregnancy related conditions, third trimester: Secondary | ICD-10-CM | POA: Insufficient documentation

## 2017-10-28 DIAGNOSIS — M545 Low back pain: Secondary | ICD-10-CM | POA: Diagnosis present

## 2017-10-28 DIAGNOSIS — Z3A33 33 weeks gestation of pregnancy: Secondary | ICD-10-CM | POA: Diagnosis not present

## 2017-10-28 DIAGNOSIS — O9989 Other specified diseases and conditions complicating pregnancy, childbirth and the puerperium: Secondary | ICD-10-CM | POA: Diagnosis not present

## 2017-10-28 DIAGNOSIS — Z87891 Personal history of nicotine dependence: Secondary | ICD-10-CM | POA: Insufficient documentation

## 2017-10-28 DIAGNOSIS — F1290 Cannabis use, unspecified, uncomplicated: Secondary | ICD-10-CM | POA: Diagnosis not present

## 2017-10-28 DIAGNOSIS — M549 Dorsalgia, unspecified: Secondary | ICD-10-CM | POA: Diagnosis not present

## 2017-10-28 DIAGNOSIS — O4703 False labor before 37 completed weeks of gestation, third trimester: Secondary | ICD-10-CM

## 2017-10-28 LAB — WET PREP, GENITAL
Clue Cells Wet Prep HPF POC: NONE SEEN
SPERM: NONE SEEN
Trich, Wet Prep: NONE SEEN
Yeast Wet Prep HPF POC: NONE SEEN

## 2017-10-28 LAB — URINALYSIS, ROUTINE W REFLEX MICROSCOPIC
BILIRUBIN URINE: NEGATIVE
Glucose, UA: NEGATIVE mg/dL
Hgb urine dipstick: NEGATIVE
Ketones, ur: NEGATIVE mg/dL
Nitrite: NEGATIVE
Protein, ur: 30 mg/dL — AB
SPECIFIC GRAVITY, URINE: 1.029 (ref 1.005–1.030)
pH: 5 (ref 5.0–8.0)

## 2017-10-28 MED ORDER — ACETAMINOPHEN 500 MG PO TABS
1000.0000 mg | ORAL_TABLET | Freq: Once | ORAL | Status: AC
  Administered 2017-10-28: 1000 mg via ORAL
  Filled 2017-10-28: qty 2

## 2017-10-28 NOTE — MAU Note (Signed)
Pt reports lower back pain and lower abd pressure since yesterday. Denies dysuria, bleeding or ROM. Reports good fetal moevement

## 2017-10-28 NOTE — MAU Provider Note (Addendum)
History     CSN: [id number]  Arrival date and time: 10/28/17 0086   First Provider Initiated Contact with Patient 10/28/17 [phone]      Chief Complaint  Patient presents with  . Abdominal Pain  . Back Pain   HPI  Ms.  Sonia Allen is a 32 y.o. year old [phone] female at [redacted]w[redacted]d weeks gestation who presents to MAU reporting low back pain and pressure since yesterday. She denies dysuria, BV or LOF. She reports good (+) FM.   Past Medical History:  Diagnosis Date  . Anemia    first and sec pregnancies  . Arrhythmia   . Chlamydia   . Gestational diabetes    diet controlled  . Gonorrhea   . Headache(784.0)   . Hemorrhoids   . Hx of migraine headaches   . Leiomyoma of uterus in pregnancy     Past Surgical History:  Procedure Laterality Date  . INDUCED ABORTION  March  2 ,2013  . WISDOM TOOTH EXTRACTION      Family History  Problem Relation Age of Onset  . Other Neg Hx   . Alcohol abuse Neg Hx   . Arthritis Neg Hx   . Asthma Neg Hx   . Birth defects Neg Hx   . Cancer Neg Hx   . COPD Neg Hx   . Depression Neg Hx   . Diabetes Neg Hx   . Drug abuse Neg Hx   . Early death Neg Hx   . Heart disease Neg Hx   . Hearing loss Neg Hx   . Hyperlipidemia Neg Hx   . Hypertension Neg Hx   . Kidney disease Neg Hx   . Learning disabilities Neg Hx   . Mental illness Neg Hx   . Mental retardation Neg Hx   . Miscarriages / Stillbirths Neg Hx   . Stroke Neg Hx   . Vision loss Neg Hx     Social History   Tobacco Use  . Smoking status: Former Research scientist (life sciences)  . Smokeless tobacco: Never Used  Substance Use Topics  . Alcohol use: No  . Drug use: Not Currently    Frequency: 14.0 times per week    Types: Marijuana    Comment: Reports not currently smoking    Allergies:  Allergies  Allergen Reactions  . Latex Rash    Burning sensation    Medications Prior to Admission  Medication Sig Dispense Refill Last Dose  . cyclobenzaprine (FLEXERIL) 10 MG tablet Take 1 tablet (10 mg  total) by mouth 3 (three) times daily as needed for muscle spasms. 30 tablet 0 Past Month at Unknown time  . ondansetron (ZOFRAN) 4 MG tablet Take 1 tablet (4 mg total) by mouth every 8 (eight) hours as needed for nausea or vomiting. (Patient taking differently: Take 4 mg by mouth every 8 (eight) hours as needed for nausea or vomiting. ) 20 tablet 0 Taking  . Prenat w/o A Vit-FeFum-FePo-FA (CONCEPT OB) 130-92.4-1 MG CAPS Take 1 capsule by mouth daily. 30 capsule 11 Past Month at Unknown time  . promethazine (PHENERGAN) 25 MG tablet Take 0.5-1 tablets (12.5-25 mg total) by mouth at bedtime as needed. 30 tablet 2 prn    Review of Systems  Constitutional: Negative.   HENT: Negative.   Eyes: Negative.   Respiratory: Negative.   Cardiovascular: Negative.   Gastrointestinal: Negative.   Endocrine: Negative.   Genitourinary: Positive for pelvic pain ("pressure").  Musculoskeletal: Positive for back pain.  Skin: Negative.  Allergic/Immunologic: Negative.   Neurological: Negative.   Hematological: Negative.   Psychiatric/Behavioral: Negative.    Physical Exam   Blood pressure 114/69, pulse 79, temperature 98.5 F (36.9 C), temperature source Oral, resp. rate 16, height 5\' 4"  (1.626 m), weight 186 lb (84.4 kg), last menstrual period 03/10/2017, SpO2 100 %.  Physical Exam  Nursing note and vitals reviewed. Constitutional: She is oriented to person, place, and time. She appears well-developed and well-nourished. No distress.  Cardiovascular: Normal rate.  Respiratory: Effort normal.  GI: Soft. There is no tenderness.  Genitourinary: Vagina normal.  Musculoskeletal: She exhibits no edema.  Normal ROM of back  Neurological: She is alert and oriented to person, place, and time.  Skin: Skin is warm and dry.  Psychiatric: She has a normal mood and affect.   Dilation: Closed Effacement (%): Thick  MAU Course  Procedures  MDM CCUA UCx NST - FHR: 135 bpm / moderate variability / accels  present / decels absent / TOCO: regular every UI mins  Results for orders placed or performed during the hospital encounter of 10/28/17 (from the past 24 hour(s))  Urinalysis, Routine w reflex microscopic     Status: Abnormal   Collection Time: 10/28/17  7:11 AM  Result Value Ref Range   Color, Urine YELLOW YELLOW   APPearance HAZY (A) CLEAR   Specific Gravity, Urine 1.029 1.005 - 1.030   pH 5.0 5.0 - 8.0   Glucose, UA NEGATIVE NEGATIVE mg/dL   Hgb urine dipstick NEGATIVE NEGATIVE   Bilirubin Urine NEGATIVE NEGATIVE   Ketones, ur NEGATIVE NEGATIVE mg/dL   Protein, ur 30 (A) NEGATIVE mg/dL   Nitrite NEGATIVE NEGATIVE   Leukocytes, UA MODERATE (A) NEGATIVE   RBC / HPF 0-5 0 - 5 RBC/hpf   WBC, UA 6-10 0 - 5 WBC/hpf   Bacteria, UA RARE (A) NONE SEEN   Squamous Epithelial / LPF 6-10 0 - 5   Mucus PRESENT    Ca Oxalate Crys, UA PRESENT    Report given to & care assumed by Marlou Porch, CNM @ [facility], MSN, CNM 10/28/2017, 7:42 AM   MDM - Preterm contractions with out cervical change. - Low back pain- sounds more musculoskeletal that preterm labor-related. Minimal improvement w/ Tylenol. Recommended Maternity support belt, heat therapy, Flexeril for home use. Pt states she can't take Flexeril at work and can't afford a maternity support belt. Wants to be taken out of work. CNM explained that it is not medically necessary and that she should discuss leave and work modification options with her employer. Note given requesting being allowed to sit and have bathroom and water breaks every 2 hours PRN.   Assessment and Plan   1. Back pain affecting pregnancy in third trimester   2. Preterm contractions.   D/C home in stable condition PTL precautions. Comfort measures  Work modifications note [place] for [employer] Follow up on 11/05/2017.   Specialty:  Obstetrics and Gynecology Why:  at 4:15 for return OB appointment Contact  information: [facility] Follow up.   Why:  as needed in emergencies Contact information: [address] [id number] [place] [phone]         Allergies as of 10/28/2017      Reactions   Latex Rash   Burning sensation      Medication  List    TAKE these medications   CONCEPT OB 130-92.4-1 MG Caps Take 1 capsule by mouth daily.   cyclobenzaprine 10 MG tablet Commonly known as:  FLEXERIL Take 1 tablet (10 mg total) by mouth 3 (three) times daily as needed for muscle spasms.   ondansetron 4 MG tablet Commonly known as:  ZOFRAN Take 1 tablet (4 mg total) by mouth every 8 (eight) hours as needed for nausea or vomiting.   promethazine 25 MG tablet Commonly known as:  PHENERGAN Take 0.5-1 tablets (12.5-25 mg total) by mouth at bedtime as needed.      Tamala Julian, [place], [place] 10/28/2017 3:01 PM

## 2017-10-28 NOTE — Discharge Instructions (Signed)
Back Pain in Pregnancy Back pain during pregnancy is common. Back pain may be caused by several factors that are related to changes during your pregnancy. Follow these instructions at home: Managing pain, stiffness, and swelling  If directed, apply ice for sudden (acute) back pain. ? Put ice in a plastic bag. ? Place a towel between your skin and the bag. ? Leave the ice on for 20 minutes, 2-3 times per day.  If directed, apply heat to the affected area before you exercise: ? Place a towel between your skin and the heat pack or heating pad. ? Leave the heat on for 20-30 minutes. ? Remove the heat if your skin turns bright red. This is especially important if you are unable to feel pain, heat, or cold. You may have a greater risk of getting burned. Activity  Exercise as told by your health care provider. Exercising is the best way to prevent or manage back pain.  Listen to your body when lifting. If lifting hurts, ask for help or bend your knees. This uses your leg muscles instead of your back muscles.  Squat down when picking up something from the floor. Do not bend over.  Only use bed rest as told by your health care provider. Bed rest should only be used for the most severe episodes of back pain. Standing, Sitting, and Lying Down  Do not stand in one place for long periods of time.  Use good posture when sitting. Make sure your head rests over your shoulders and is not hanging forward. Use a pillow on your lower back if necessary.  Try sleeping on your side, preferably the left side, with a pillow or two between your legs. If you are sore after a night's rest, your bed may be too soft. A firm mattress may provide more support for your back during pregnancy. General instructions  Do not wear high heels.  Eat a healthy diet. Try to gain weight within your health care provider's recommendations.  Use a maternity girdle, elastic sling, or back brace as told by your health care  provider.  Take over-the-counter and prescription medicines only as told by your health care provider.  Keep all follow-up visits as told by your health care provider. This is important. This includes any visits with any specialists, such as a physical therapist. Contact a health care provider if:  Your back pain interferes with your daily activities.  You have increasing pain in other parts of your body. Get help right away if:  You develop numbness, tingling, weakness, or problems with the use of your arms or legs.  You develop severe back pain that is not controlled with medicine.  You have a sudden change in bowel or bladder control.  You develop shortness of breath, dizziness, or you faint.  You develop nausea, vomiting, or sweating.  You have back pain that is a rhythmic, cramping pain similar to labor pains. Labor pain is usually 1-2 minutes apart, lasts for about 1 minute, and involves a bearing down feeling or pressure in your pelvis.  You have back pain and your water breaks or you have vaginal bleeding.  You have back pain or numbness that travels down your leg.  Your back pain developed after you fell.  You develop pain on one side of your back.  You see blood in your urine.  You develop skin blisters in the area of your back pain. This information is not intended to replace advice given to you   by your health care provider. Make sure you discuss any questions you have with your health care provider. Document Released: 09/25/2005 Document Revised: 11/23/2015 Document Reviewed: 03/01/2015 Elsevier Interactive Patient Education  2018 [employer]  

## 2017-10-29 LAB — GC/CHLAMYDIA PROBE AMP (~~LOC~~) NOT AT ARMC
CHLAMYDIA, DNA PROBE: NEGATIVE
NEISSERIA GONORRHEA: NEGATIVE

## 2017-10-30 ENCOUNTER — Emergency Department (HOSPITAL_COMMUNITY)
Admission: EM | Admit: 2017-10-30 | Discharge: 2017-10-30 | Disposition: A | Discharge status: Home / Self Care | Payer: 59 | Attending: Emergency Medicine | Admitting: Emergency Medicine

## 2017-10-30 ENCOUNTER — Emergency Department (HOSPITAL_COMMUNITY): Payer: 59

## 2017-10-30 ENCOUNTER — Encounter (HOSPITAL_COMMUNITY): Payer: Self-pay | Admitting: Emergency Medicine

## 2017-10-30 DIAGNOSIS — O99513 Diseases of the respiratory system complicating pregnancy, third trimester: Secondary | ICD-10-CM | POA: Diagnosis present

## 2017-10-30 DIAGNOSIS — Z79899 Other long term (current) drug therapy: Secondary | ICD-10-CM | POA: Insufficient documentation

## 2017-10-30 DIAGNOSIS — Z87891 Personal history of nicotine dependence: Secondary | ICD-10-CM | POA: Insufficient documentation

## 2017-10-30 DIAGNOSIS — J329 Chronic sinusitis, unspecified: Secondary | ICD-10-CM | POA: Diagnosis not present

## 2017-10-30 DIAGNOSIS — Z3A34 34 weeks gestation of pregnancy: Secondary | ICD-10-CM | POA: Insufficient documentation

## 2017-10-30 LAB — CULTURE, OB URINE
Culture: 10000 — AB
SPECIAL REQUESTS: NORMAL

## 2017-10-30 MED ORDER — FLUTICASONE PROPIONATE 50 MCG/ACT NA SUSP: 1.0000 | Freq: Every day | NASAL | 2 refills | Status: DC

## 2017-10-30 NOTE — ED Provider Notes (Signed)
[facility] DEPT Provider Note   CSN: [id number] Arrival date & time: 10/30/17  1203     History   Chief Complaint Chief Complaint  Patient presents with  . Sore Throat    HPI Sonia Allen is a 32 y.o. female who is currently [redacted] weeks pregnant, who presents to ED for evaluation of 2 to 3-year history of intermittent sore throats, sinus drainage, nasal congestion.  She states that intermittently, she will have blood-tinged nasal drainage.  She has not taken any medications to help with the symptoms.  She denies any cough, chest pain, hemoptysis, fevers, sick contacts with similar symptoms, trouble breathing or trouble swallowing.  HPI  Past Medical History:  Diagnosis Date  . Anemia    first and sec pregnancies  . Arrhythmia   . Chlamydia   . Gestational diabetes    diet controlled  . Gonorrhea   . Headache(784.0)   . Hemorrhoids   . Hx of migraine headaches   . Leiomyoma of uterus in pregnancy     Patient Active Problem List   Diagnosis Date Noted  . Dysuria during pregnancy 10/13/2017  . Marijuana use 06/16/2017  . Supervision of high risk pregnancy, antepartum 05/19/2017  . Uterine fibroids affecting pregnancy 10/14/2012  . Anemia 10/14/2012  . HEADACHE 02/18/2007    Past Surgical History:  Procedure Laterality Date  . INDUCED ABORTION  March  2 ,2013  . WISDOM TOOTH EXTRACTION       OB History    Gravida  8   Para  4   Term  4   Preterm  0   AB  3   Living  4     SAB  1   TAB  2   Ectopic  0   Multiple  0   Live Births  4            Home Medications    Prior to Admission medications   Medication Sig Start Date End Date Taking? Authorizing Provider  cyclobenzaprine (FLEXERIL) 10 MG tablet Take 1 tablet (10 mg total) by mouth 3 (three) times daily as needed for muscle spasms. 09/29/17  Yes Julianne Handler, CNM  promethazine (PHENERGAN) 25 MG tablet Take 0.5-1 tablets (12.5-25 mg total) by mouth at  bedtime as needed. 04/14/17  Yes Leftwich-Kirby, Kathie Dike, CNM  fluticasone (FLONASE) 50 MCG/ACT nasal spray Place 1 spray into both nostrils daily. 10/30/17   Tyshawn Ciullo, PA-C  ondansetron (ZOFRAN) 4 MG tablet Take 1 tablet (4 mg total) by mouth every 8 (eight) hours as needed for nausea or vomiting. Patient not taking: Reported on 10/30/2017 08/05/17   Dannielle Huh, DO  Prenat w/o A Vit-FeFum-FePo-FA (CONCEPT OB) 130-92.4-1 MG CAPS Take 1 capsule by mouth daily. Patient not taking: Reported on 10/30/2017 04/14/17   Leftwich-KirbyKathie Dike, CNM    Family History Family History  Problem Relation Age of Onset  . Other Neg Hx   . Alcohol abuse Neg Hx   . Arthritis Neg Hx   . Asthma Neg Hx   . Birth defects Neg Hx   . Cancer Neg Hx   . COPD Neg Hx   . Depression Neg Hx   . Diabetes Neg Hx   . Drug abuse Neg Hx   . Early death Neg Hx   . Heart disease Neg Hx   . Hearing loss Neg Hx   . Hyperlipidemia Neg Hx   . Hypertension Neg Hx   . Kidney disease  Neg Hx   . Learning disabilities Neg Hx   . Mental illness Neg Hx   . Mental retardation Neg Hx   . Miscarriages / Stillbirths Neg Hx   . Stroke Neg Hx   . Vision loss Neg Hx     Social History Social History   Tobacco Use  . Smoking status: Former Research scientist (life sciences)  . Smokeless tobacco: Never Used  Substance Use Topics  . Alcohol use: No  . Drug use: Not Currently    Frequency: 14.0 times per week    Types: Marijuana    Comment: Reports not currently smoking     Allergies   Latex   Review of Systems Review of Systems  Constitutional: Negative for chills and fever.  HENT: Positive for congestion, postnasal drip, sinus pressure and sore throat. Negative for dental problem, ear discharge, ear pain, facial swelling, hearing loss, mouth sores, trouble swallowing and voice change.   Respiratory: Negative for cough and shortness of breath.   Cardiovascular: Negative for chest pain.  Gastrointestinal: Negative for nausea and vomiting.      Physical Exam Updated Vital Signs BP 110/89 (BP Location: Left Arm)   Pulse 86   Temp 98.4 F (36.9 C) (Oral)   Resp 17   LMP 03/10/2017   SpO2 100%   Physical Exam  Constitutional: She appears well-developed and well-nourished. No distress.  Nontoxic-appearing and in no acute distress.  HENT:  Head: Normocephalic and atraumatic.  Right Ear: Tympanic membrane normal.  Left Ear: Tympanic membrane normal.  Nose: Mucosal edema present. Right sinus exhibits no maxillary sinus tenderness and no frontal sinus tenderness. Left sinus exhibits no maxillary sinus tenderness and no frontal sinus tenderness.  Mouth/Throat: Uvula is midline and oropharynx is clear and moist.  Postnasal drainage seen. Patient does not appear to be in acute distress. No trismus or drooling present. No pooling of secretions. Patient is tolerating secretions and is not in respiratory distress. No neck pain or tenderness to palpation of the neck. Full active and passive range of motion of the neck. No evidence of RPA or PTA.  Eyes: Conjunctivae and EOM are normal. No scleral icterus.  Neck: Normal range of motion.  Cardiovascular: Normal rate, regular rhythm and normal heart sounds.  Pulmonary/Chest: Effort normal and breath sounds normal. No respiratory distress.  Neurological: She is alert.  Skin: No rash noted. She is not diaphoretic.  Psychiatric: She has a normal mood and affect.  Nursing note and vitals reviewed.    ED Treatments / Results  Labs (all labs ordered are listed, but only abnormal results are displayed) Labs Reviewed - No data to display  EKG None  Radiology Dg Chest 2 View  Result Date: 10/30/2017 CLINICAL DATA:  Cough EXAM: CHEST - 2 VIEW COMPARISON:  July 10, 2016 FINDINGS: Lungs are clear. Heart size and pulmonary vascularity are normal. No adenopathy. No pneumothorax. No bone lesions. IMPRESSION: No edema or consolidation. Electronically Signed   By: Lowella Grip III M.D.    On: 10/30/2017 13:57    Procedures Procedures (including critical care time)  Medications Ordered in ED Medications - No data to display   Initial Impression / Assessment and Plan / ED Course  I have reviewed the triage vital signs and the nursing notes.  Pertinent labs & imaging results that were available during my care of the patient were reviewed by me and considered in my medical decision making (see chart for details).     Patient presents to ED for  evaluation of 2 to 3-year history of intermittent sore throats, sinus drainage, nasal congestion.  She also reports intermittent blood-tinged nasal drainage.  Has not taken any medications to help with symptoms.  Denies any cough, chest pain, hemoptysis, shortness of breath, fevers, trouble swallowing.  On physical exam there are no signs of RPA or PTA on examination of posterior oropharynx.  There is postnasal drainage seen.  There is also mucosal edema on inspection of the nose.  Lungs clear to auscultation bilaterally.  She is not tachycardic, tachypneic or hypoxic.  Chest x-ray returned as negative.  Suspect that her symptoms are due to chronic sinusitis.  Doubt pulmonary cause of symptoms.  Will give Flonase to help with congestion and advised her to follow-up with PCP for further evaluation.  Spoke to [place] from pharmacy who states that the Flonase is safe in pregnancy.  Advised to return for any severe or worsening symptoms.  Portions of this note were generated with [employer]. Dictation errors may occur despite best attempts at proofreading.   Final Clinical Impressions(s) / ED Diagnoses   Final diagnoses:  Chronic sinusitis, unspecified location    ED Discharge Orders        Ordered    fluticasone (FLONASE) 50 MCG/ACT nasal spray  Daily     10/30/17 [place], Geovana Gebel, PA-C 10/30/17 1424    Valarie Merino, MD 10/30/17 1545

## 2017-10-30 NOTE — ED Triage Notes (Signed)
Pt reports that "when I spit it was brown yesterday and now today has blood in it". C/o runny nose and when blows her nose its clear. Patient also c/o sore throat that has been going on for 3-5 years.

## 2017-10-30 NOTE — Discharge Instructions (Signed)
Use Flonase as directed to help with your symptoms. Return to ED for any chest pain, shortness of breath, coughing up blood, trouble swallowing.

## 2017-11-05 ENCOUNTER — Encounter: Payer: Self-pay | Admitting: Obstetrics and Gynecology

## 2017-11-05 ENCOUNTER — Encounter: Payer: 59 | Admitting: Obstetrics and Gynecology

## 2017-11-05 ENCOUNTER — Telehealth: Payer: Self-pay | Admitting: Obstetrics and Gynecology

## 2017-11-05 NOTE — Telephone Encounter (Addendum)
Pt cannot make appt today at 4:15 with [place]. Pt states cannot come any day prior to 3:30. HOB. Please advise when she can come as there is nothing for weeks in the schedule for [facility] 3:30 or after.

## 2017-11-06 NOTE — Telephone Encounter (Signed)
Per chart review, pt has been scheduled for prenatal visits @ 1615 on the following dates: 5/15, 5/22 and 5/29.

## 2017-11-06 NOTE — Progress Notes (Signed)
Patient did not keep OB appointment for 11/05/2017.  Durene Romans MD Attending Center for [employer] farm manager)

## 2017-11-12 ENCOUNTER — Ambulatory Visit (INDEPENDENT_AMBULATORY_CARE_PROVIDER_SITE_OTHER): Payer: 59

## 2017-11-12 ENCOUNTER — Encounter: Payer: Self-pay | Admitting: General Practice

## 2017-11-12 VITALS — BP 124/78 | HR 88 | Wt 186.4 lb

## 2017-11-12 DIAGNOSIS — O0990 Supervision of high risk pregnancy, unspecified, unspecified trimester: Secondary | ICD-10-CM

## 2017-11-12 LAB — OB RESULTS CONSOLE GBS: STREP GROUP B AG: POSITIVE

## 2017-11-12 NOTE — Patient Instructions (Signed)

## 2017-11-12 NOTE — Progress Notes (Signed)
   PRENATAL VISIT NOTE  Subjective:  Sonia Allen is a 32 y.o. [id number] at [redacted]w[redacted]d being seen today for ongoing prenatal care.  She is currently monitored for the following issues for this high-risk pregnancy and has HEADACHE; Uterine fibroids affecting pregnancy; Anemia; Supervision of high risk pregnancy, antepartum; Marijuana use; and Dysuria during pregnancy on their problem list.  Patient reports no complaints.  Contractions: Irritability. Vag. Bleeding: None.  Movement: Present. Denies leaking of fluid.   The following portions of the patient's history were reviewed and updated as appropriate: allergies, current medications, past family history, past medical history, past social history, past surgical history and problem list. Problem list updated.  Objective:   Vitals:   11/12/17 1644  BP: 124/78  Pulse: 88  Weight: 186 lb 6.4 oz (84.6 kg)    Fetal Status: Fetal Heart Rate (bpm): 144 Fundal Height: 36 cm Movement: Present     General:  Alert, oriented and cooperative. Patient is in no acute distress.  Skin: Skin is warm and dry. No rash noted.   Cardiovascular: Normal heart rate noted  Respiratory: Normal respiratory effort, no problems with respiration noted  Abdomen: Soft, gravid, appropriate for gestational age.  Pain/Pressure: Present     Pelvic: Cervical exam performed Dilation: Fingertip Effacement (%): Thick Station: Ballotable  Extremities: Normal range of motion.  Edema: Trace  Mental Status: Normal mood and affect. Normal behavior. Normal judgment and thought content.   Assessment and Plan:  Pregnancy: [id number] at [redacted]w[redacted]d  1. Supervision of high risk pregnancy, antepartum - No complaints. Routine care - Culture, beta strep (group b only)  - Patient failed 1hr GTT and has missed several ROB appointments. Discussed with patient scheduling 3hr GTT asap and patient states she will be unable to come for a 3hr GTT. Lengthy discussion about risks of uncontrolled DM in  pregnancy including delayed fetal lung maturity, IUFD and shoulder dystocia possibly resulting brachial plexus palsy, brain damage, intrapartum death and extensive obstetric lacerations. Patient verbalizes understanding.  Preterm labor symptoms and general obstetric precautions including but not limited to vaginal bleeding, contractions, leaking of fluid and fetal movement were reviewed in detail with the patient. Please refer to After Visit Summary for other counseling recommendations.  Return in about 1 week (around 11/19/2017) for Return OB visit, 3hr GTT.  Future Appointments  Date Time Provider [place]  11/19/2017  4:15 PM Chancy Milroy, MD [facility] [place]  11/26/2017  4:15 PM Chancy Milroy, MD [place], [place] 11/12/17 5:05 PM

## 2017-11-15 LAB — CULTURE, BETA STREP (GROUP B ONLY): STREP GP B CULTURE: POSITIVE — AB

## 2017-11-19 ENCOUNTER — Ambulatory Visit (INDEPENDENT_AMBULATORY_CARE_PROVIDER_SITE_OTHER): Payer: 59 | Admitting: Obstetrics & Gynecology

## 2017-11-19 DIAGNOSIS — O99820 Streptococcus B carrier state complicating pregnancy: Secondary | ICD-10-CM

## 2017-11-19 DIAGNOSIS — O99810 Abnormal glucose complicating pregnancy: Secondary | ICD-10-CM

## 2017-11-19 DIAGNOSIS — O0990 Supervision of high risk pregnancy, unspecified, unspecified trimester: Secondary | ICD-10-CM

## 2017-11-19 NOTE — Progress Notes (Signed)
   PRENATAL VISIT NOTE  Subjective:  Sonia Allen is a 32 y.o. [id number] at [redacted]w[redacted]d being seen today for ongoing prenatal care.  She is currently monitored for the following issues for this high-risk pregnancy and has HEADACHE; Uterine fibroids affecting pregnancy; Anemia; Supervision of high risk pregnancy, antepartum; Marijuana use; Dysuria during pregnancy; Group B Streptococcus carrier, +RV culture, currently pregnant; and Abnormal glucose tolerance test (GTT) during pregnancy, antepartum on their problem list.  Patient reports no complaints.  Contractions: Irritability. Vag. Bleeding: None.  Movement: Present. Denies leaking of fluid.   The following portions of the patient's history were reviewed and updated as appropriate: allergies, current medications, past family history, past medical history, past social history, past surgical history and problem list. Problem list updated.  Objective:   Vitals:   11/19/17 1637  BP: 125/69  Pulse: 85  Weight: 186 lb 4.8 oz (84.5 kg)    Fetal Status: Fetal Heart Rate (bpm): 146 Fundal Height: 37 cm Movement: Present  Presentation: Vertex  General:  Alert, oriented and cooperative. Patient is in no acute distress.  Skin: Skin is warm and dry. No rash noted.   Cardiovascular: Normal heart rate noted  Respiratory: Normal respiratory effort, no problems with respiration noted  Abdomen: Soft, gravid, appropriate for gestational age.  Pain/Pressure: Present     Pelvic: Cervical exam performed Dilation: Fingertip Effacement (%): Thick Station: Ballotable  Extremities: Normal range of motion.  Edema: Trace  Mental Status: Normal mood and affect. Normal behavior. Normal judgment and thought content.   Assessment and Plan:  Pregnancy: [id number] at [redacted]w[redacted]d  1. Abnormal glucose tolerance test (GTT) during pregnancy, antepartum Random blood sugar today is 100. Has missed several appointments for 3 hr GTT; scheduled this Friday 11/21/17. If she misses this  appointment, recommend IOL at 39 weeks given unknown DM status. Risks of DM in pregnancy again reviewed with patient.  2. Group B Streptococcus carrier, +RV culture, currently pregnant Will need intrapartum prophylaxis with PCN, patient aware.  3. Supervision of high risk pregnancy, antepartum Term labor symptoms and general obstetric precautions including but not limited to vaginal bleeding, contractions, leaking of fluid and fetal movement were reviewed in detail with the patient. Please refer to After Visit Summary for other counseling recommendations.  Return in about 1 week (around 11/26/2017) for OB Visit ([place]).  Future Appointments  Date Time Provider [place]  11/21/2017  8:50 AM [facility]  11/26/2017  4:15 PM Chancy Milroy, MD [place]    Verita Schneiders, MD

## 2017-11-19 NOTE — Patient Instructions (Signed)
Return to clinic for any scheduled appointments or obstetric concerns, or go to [facility] for evaluation  

## 2017-11-20 ENCOUNTER — Other Ambulatory Visit: Payer: Self-pay | Admitting: *Deleted

## 2017-11-20 DIAGNOSIS — O0990 Supervision of high risk pregnancy, unspecified, unspecified trimester: Secondary | ICD-10-CM

## 2017-11-20 DIAGNOSIS — O99810 Abnormal glucose complicating pregnancy: Secondary | ICD-10-CM

## 2017-11-20 LAB — GLUCOSE, CAPILLARY: GLUCOSE-CAPILLARY: 100 mg/dL — AB (ref 65–99)

## 2017-11-21 ENCOUNTER — Other Ambulatory Visit: Payer: 59

## 2017-11-21 DIAGNOSIS — O0993 Supervision of high risk pregnancy, unspecified, third trimester: Secondary | ICD-10-CM

## 2017-11-21 DIAGNOSIS — O0990 Supervision of high risk pregnancy, unspecified, unspecified trimester: Secondary | ICD-10-CM

## 2017-11-21 DIAGNOSIS — O99810 Abnormal glucose complicating pregnancy: Secondary | ICD-10-CM

## 2017-11-22 LAB — GLUCOSE TOLERANCE, 2 HOURS W/ 1HR
GLUCOSE, 1 HOUR: 105 mg/dL (ref 65–179)
Glucose, 2 hour: 75 mg/dL (ref 65–152)
Glucose, Fasting: 67 mg/dL (ref 65–91)

## 2017-11-26 ENCOUNTER — Encounter: Payer: Self-pay | Admitting: *Deleted

## 2017-11-26 ENCOUNTER — Ambulatory Visit (INDEPENDENT_AMBULATORY_CARE_PROVIDER_SITE_OTHER): Payer: 59 | Admitting: Obstetrics and Gynecology

## 2017-11-26 ENCOUNTER — Encounter: Payer: Self-pay | Admitting: Obstetrics and Gynecology

## 2017-11-26 VITALS — BP 117/76 | HR 95 | Wt 187.9 lb

## 2017-11-26 DIAGNOSIS — O99820 Streptococcus B carrier state complicating pregnancy: Secondary | ICD-10-CM

## 2017-11-26 DIAGNOSIS — O0990 Supervision of high risk pregnancy, unspecified, unspecified trimester: Secondary | ICD-10-CM

## 2017-11-26 NOTE — Progress Notes (Signed)
Subjective:  Sonia Allen is a 32 y.o. [id number] at [redacted]w[redacted]d being seen today for ongoing prenatal care.  She is currently monitored for the following issues for this high-risk pregnancy and has Uterine fibroids affecting pregnancy; Supervision of high risk pregnancy, antepartum; Marijuana use; and Group B Streptococcus carrier, +RV culture, currently pregnant on their problem list.  Patient reports general discomforts of pregancy.  Contractions: Irritability. Vag. Bleeding: None.  Movement: Present. Denies leaking of fluid.   The following portions of the patient's history were reviewed and updated as appropriate: allergies, current medications, past family history, past medical history, past social history, past surgical history and problem list. Problem list updated.  Objective:   Vitals:   11/26/17 1628  BP: 117/76  Pulse: 95  Weight: 187 lb 14.4 oz (85.2 kg)    Fetal Status: Fetal Heart Rate (bpm): 151   Movement: Present     General:  Alert, oriented and cooperative. Patient is in no acute distress.  Skin: Skin is warm and dry. No rash noted.   Cardiovascular: Normal heart rate noted  Respiratory: Normal respiratory effort, no problems with respiration noted  Abdomen: Soft, gravid, appropriate for gestational age. Pain/Pressure: Present     Pelvic:  Cervical exam deferred        Extremities: Normal range of motion.  Edema: None  Mental Status: Normal mood and affect. Normal behavior. Normal judgment and thought content.   Urinalysis:      Assessment and Plan:  Pregnancy: [id number] at [redacted]w[redacted]d  1. Supervision of high risk pregnancy, antepartum Stable Labor precautions  2. Group B Streptococcus carrier, +RV culture, currently pregnant Tx while in labor  3. Unwanted fertility Medicaid secondary BTL papers signed  Term labor symptoms and general obstetric precautions including but not limited to vaginal bleeding, contractions, leaking of fluid and fetal movement were reviewed in  detail with the patient. Please refer to After Visit Summary for other counseling recommendations.  Return in about 1 week (around 12/03/2017) for OB visit.   Chancy Milroy, MD

## 2017-11-26 NOTE — Patient Instructions (Signed)
Vaginal Delivery Vaginal delivery means that you will give birth by pushing your baby out of your birth canal (vagina). A team of health care providers will help you before, during, and after vaginal delivery. Birth experiences are unique for every woman and every pregnancy, and birth experiences vary depending on where you choose to give birth. What should I do to prepare for my baby's birth? Before your baby is born, it is important to talk with your health care provider about:  Your labor and delivery preferences. These may include: ? Medicines that you may be given. ? How you will manage your pain. This might include non-medical pain relief techniques or injectable pain relief such as epidural analgesia. ? How you and your baby will be monitored during labor and delivery. ? Who may be in the labor and delivery room with you. ? Your feelings about surgical delivery of your baby (cesarean delivery, or C-section) if this becomes necessary. ? Your feelings about receiving donated blood through an IV tube (blood transfusion) if this becomes necessary.  Whether you are able: ? To take pictures or videos of the birth. ? To eat during labor and delivery. ? To move around, walk, or change positions during labor and delivery.  What to expect after your baby is born, such as: ? Whether delayed umbilical cord clamping and cutting is offered. ? Who will care for your baby right after birth. ? Medicines or tests that may be recommended for your baby. ? Whether breastfeeding is supported in your hospital or birth center. ? How long you will be in the hospital or birth center.  How any medical conditions you have may affect your baby or your labor and delivery experience.  To prepare for your baby's birth, you should also:  Attend all of your health care visits before delivery (prenatal visits) as recommended by your health care provider. This is important.  Prepare your home for your baby's  arrival. Make sure that you have: ? Diapers. ? Baby clothing. ? Feeding equipment. ? Safe sleeping arrangements for you and your baby.  Install a car seat in your vehicle. Have your car seat checked by a certified car seat installer to make sure that it is installed safely.  Think about who will help you with your new baby at home for at least the first several weeks after delivery.  What can I expect when I arrive at the birth center or hospital? Once you are in labor and have been admitted into the hospital or birth center, your health care provider may:  Review your pregnancy history and any concerns you have.  Insert an IV tube into one of your veins. This is used to give you fluids and medicines.  Check your blood pressure, pulse, temperature, and heart rate (vital signs).  Check whether your bag of water (amniotic sac) has broken (ruptured).  Talk with you about your birth plan and discuss pain control options.  Monitoring Your health care provider may monitor your contractions (uterine monitoring) and your baby's heart rate (fetal monitoring). You may need to be monitored:  Often, but not continuously (intermittently).  All the time or for long periods at a time (continuously). Continuous monitoring may be needed if: ? You are taking certain medicines, such as medicine to relieve pain or make your contractions stronger. ? You have pregnancy or labor complications.  Monitoring may be done by:  Placing a special stethoscope or a handheld monitoring device on your abdomen to   check your baby's heartbeat, and feeling your abdomen for contractions. This method of monitoring does not continuously record your baby's heartbeat or your contractions.  Placing monitors on your abdomen (external monitors) to record your baby's heartbeat and the frequency and length of contractions. You may not have to wear external monitors all the time.  Placing monitors inside of your uterus  (internal monitors) to record your baby's heartbeat and the frequency, length, and strength of your contractions. ? Your health care provider may use internal monitors if he or she needs more information about the strength of your contractions or your baby's heart rate. ? Internal monitors are put in place by passing a thin, flexible wire through your vagina and into your uterus. Depending on the type of monitor, it may remain in your uterus or on your baby's head until birth. ? Your health care provider will discuss the benefits and risks of internal monitoring with you and will ask for your permission before inserting the monitors.  Telemetry. This is a type of continuous monitoring that can be done with external or internal monitors. Instead of having to stay in bed, you are able to move around during telemetry. Ask your health care provider if telemetry is an option for you.  Physical exam Your health care provider may perform a physical exam. This may include:  Checking whether your baby is positioned: ? With the head toward your vagina (head-down). This is most common. ? With the head toward the top of your uterus (head-up or breech). If your baby is in a breech position, your health care provider may try to turn your baby to a head-down position so you can deliver vaginally. If it does not seem that your baby can be born vaginally, your provider may recommend surgery to deliver your baby. In rare cases, you may be able to deliver vaginally if your baby is head-up (breech delivery). ? Lying sideways (transverse). Babies that are lying sideways cannot be delivered vaginally.  Checking your cervix to determine: ? Whether it is thinning out (effacing). ? Whether it is opening up (dilating). ? How low your baby has moved into your birth canal.  What are the three stages of labor and delivery?  Normal labor and delivery is divided into the following three stages: Stage 1  Stage 1 is the  longest stage of labor, and it can last for hours or days. Stage 1 includes: ? Early labor. This is when contractions may be irregular, or regular and mild. Generally, early labor contractions are more than 10 minutes apart. ? Active labor. This is when contractions get longer, more regular, more frequent, and more intense. ? The transition phase. This is when contractions happen very close together, are very intense, and may last longer than during any other part of labor.  Contractions generally feel mild, infrequent, and irregular at first. They get stronger, more frequent (about every 2-3 minutes), and more regular as you progress from early labor through active labor and transition.  Many women progress through stage 1 naturally, but you may need help to continue making progress. If this happens, your health care provider may talk with you about: ? Rupturing your amniotic sac if it has not ruptured yet. ? Giving you medicine to help make your contractions stronger and more frequent.  Stage 1 ends when your cervix is completely dilated to 4 inches (10 cm) and completely effaced. This happens at the end of the transition phase. Stage 2  Once   your cervix is completely effaced and dilated to 4 inches (10 cm), you may start to feel an urge to push. It is common for the body to naturally take a rest before feeling the urge to push, especially if you received an epidural or certain other pain medicines. This rest period may last for up to 1-2 hours, depending on your unique labor experience.  During stage 2, contractions are generally less painful, because pushing helps relieve contraction pain. Instead of contraction pain, you may feel stretching and burning pain, especially when the widest part of your baby's head passes through the vaginal opening (crowning).  Your health care provider will closely monitor your pushing progress and your baby's progress through the vagina during stage 2.  Your  health care provider may massage the area of skin between your vaginal opening and anus (perineum) or apply warm compresses to your perineum. This helps it stretch as the baby's head starts to crown, which can help prevent perineal tearing. ? In some cases, an incision may be made in your perineum (episiotomy) to allow the baby to pass through the vaginal opening. An episiotomy helps to make the opening of the vagina larger to allow more room for the baby to fit through.  It is very important to breathe and focus so your health care provider can control the delivery of your baby's head. Your health care provider may have you decrease the intensity of your pushing, to help prevent perineal tearing.  After delivery of your baby's head, the shoulders and the rest of the body generally deliver very quickly and without difficulty.  Once your baby is delivered, the umbilical cord may be cut right away, or this may be delayed for 1-2 minutes, depending on your baby's health. This may vary among health care providers, hospitals, and birth centers.  If you and your baby are healthy enough, your baby may be placed on your chest or abdomen to help maintain the baby's temperature and to help you bond with each other. Some mothers and babies start breastfeeding at this time. Your health care team will dry your baby and help keep your baby warm during this time.  Your baby may need immediate care if he or she: ? Showed signs of distress during labor. ? Has a medical condition. ? Was born too early (prematurely). ? Had a bowel movement before birth (meconium). ? Shows signs of difficulty transitioning from being inside the uterus to being outside of the uterus. If you are planning to breastfeed, your health care team will help you begin a feeding. Stage 3  The third stage of labor starts immediately after the birth of your baby and ends after you deliver the placenta. The placenta is an organ that develops  during pregnancy to provide oxygen and nutrients to your baby in the womb.  Delivering the placenta may require some pushing, and you may have mild contractions. Breastfeeding can stimulate contractions to help you deliver the placenta.  After the placenta is delivered, your uterus should tighten (contract) and become firm. This helps to stop bleeding in your uterus. To help your uterus contract and to control bleeding, your health care provider may: ? Give you medicine by injection, through an IV tube, by mouth, or through your rectum (rectally). ? Massage your abdomen or perform a vaginal exam to remove any blood clots that are left in your uterus. ? Empty your bladder by placing a thin, flexible tube (catheter) into your bladder. ? Encourage   you to breastfeed your baby. After labor is over, you and your baby will be monitored closely to ensure that you are both healthy until you are ready to go home. Your health care team will teach you how to care for yourself and your baby. This information is not intended to replace advice given to you by your health care provider. Make sure you discuss any questions you have with your health care provider. Document Released: 03/26/2008 Document Revised: 01/05/2016 Document Reviewed: 07/02/2015 Elsevier Interactive Patient Education  2018 [employer]  

## 2017-12-01 ENCOUNTER — Encounter (HOSPITAL_COMMUNITY): Payer: Self-pay

## 2017-12-01 ENCOUNTER — Inpatient Hospital Stay (HOSPITAL_COMMUNITY)
Admission: AD | Admit: 2017-12-01 | Discharge: 2017-12-01 | Disposition: A | Discharge status: Home / Self Care | Payer: 59 | Source: Ambulatory Visit | Attending: Obstetrics & Gynecology | Admitting: Obstetrics & Gynecology

## 2017-12-01 DIAGNOSIS — B9689 Other specified bacterial agents as the cause of diseases classified elsewhere: Secondary | ICD-10-CM | POA: Diagnosis not present

## 2017-12-01 DIAGNOSIS — N760 Acute vaginitis: Secondary | ICD-10-CM | POA: Diagnosis not present

## 2017-12-01 DIAGNOSIS — Z87891 Personal history of nicotine dependence: Secondary | ICD-10-CM | POA: Insufficient documentation

## 2017-12-01 DIAGNOSIS — Z3A38 38 weeks gestation of pregnancy: Secondary | ICD-10-CM | POA: Diagnosis not present

## 2017-12-01 DIAGNOSIS — N898 Other specified noninflammatory disorders of vagina: Secondary | ICD-10-CM

## 2017-12-01 DIAGNOSIS — Z7951 Long term (current) use of inhaled steroids: Secondary | ICD-10-CM | POA: Diagnosis not present

## 2017-12-01 DIAGNOSIS — O9989 Other specified diseases and conditions complicating pregnancy, childbirth and the puerperium: Secondary | ICD-10-CM

## 2017-12-01 DIAGNOSIS — Z9889 Other specified postprocedural states: Secondary | ICD-10-CM | POA: Insufficient documentation

## 2017-12-01 DIAGNOSIS — F1290 Cannabis use, unspecified, uncomplicated: Secondary | ICD-10-CM

## 2017-12-01 DIAGNOSIS — O23593 Infection of other part of genital tract in pregnancy, third trimester: Secondary | ICD-10-CM | POA: Diagnosis not present

## 2017-12-01 DIAGNOSIS — Z91040 Latex allergy status: Secondary | ICD-10-CM | POA: Diagnosis not present

## 2017-12-01 DIAGNOSIS — O99820 Streptococcus B carrier state complicating pregnancy: Secondary | ICD-10-CM

## 2017-12-01 DIAGNOSIS — Z8619 Personal history of other infectious and parasitic diseases: Secondary | ICD-10-CM | POA: Diagnosis not present

## 2017-12-01 LAB — URINALYSIS, ROUTINE W REFLEX MICROSCOPIC
Bacteria, UA: NONE SEEN
Bilirubin Urine: NEGATIVE
GLUCOSE, UA: NEGATIVE mg/dL
KETONES UR: NEGATIVE mg/dL
LEUKOCYTES UA: NEGATIVE
NITRITE: NEGATIVE
Protein, ur: NEGATIVE mg/dL
Specific Gravity, Urine: 1.021 (ref 1.005–1.030)
pH: 5 (ref 5.0–8.0)

## 2017-12-01 LAB — WET PREP, GENITAL
Sperm: NONE SEEN
Trich, Wet Prep: NONE SEEN
Yeast Wet Prep HPF POC: NONE SEEN

## 2017-12-01 LAB — OB RESULTS CONSOLE GC/CHLAMYDIA: GC PROBE AMP, GENITAL: NEGATIVE

## 2017-12-01 MED ORDER — METRONIDAZOLE 500 MG PO TABS: 500.0000 mg | ORAL_TABLET | Freq: Two times a day (BID) | ORAL | 0 refills | Status: DC

## 2017-12-01 NOTE — MAU Note (Addendum)
Pt reports that she had some clear discharge and light spotting when she wiped this morning. Some mild ctx but none now. Pt is feeling some burning on her labia when she urinates.

## 2017-12-01 NOTE — MAU Provider Note (Signed)
Patient Sonia Allen is a 32 y.o. [phone] At [redacted]w[redacted]d here with complaints of vaginal discharge last night. She also had a few drops of blood when she wiped this morning. She denies decreased fetal movement; she has felt some mild contractions. She denies HA, blurry vision, floating spots, NV, back pain.   History     CSN: [id number]  Arrival date and time: 12/01/17 1040   None     Chief Complaint  Patient presents with  . Vaginal Discharge   Vaginal Discharge  The patient's primary symptoms include vaginal discharge. This is a new problem. The current episode started yesterday. The problem occurs 2 to 4 times per day. The problem has been resolved. Pertinent negatives include no constipation or diarrhea. The vaginal discharge was bloody. The vaginal bleeding is spotting.  She also noticed a few drops of blood on her toilet paper. This happened one time this morning. She also says that her labia burns after she is done urinating.   OB History    Gravida  8   Para  4   Term  4   Preterm  0   AB  3   Living  4     SAB  1   TAB  2   Ectopic  0   Multiple  0   Live Births  4           Past Medical History:  Diagnosis Date  . Anemia    first and sec pregnancies  . Arrhythmia   . Chlamydia   . Gestational diabetes    diet controlled  . Gonorrhea   . Headache(784.0)   . Hemorrhoids   . Hx of migraine headaches   . Leiomyoma of uterus in pregnancy     Past Surgical History:  Procedure Laterality Date  . INDUCED ABORTION  March  2 ,2013  . WISDOM TOOTH EXTRACTION      Family History  Problem Relation Age of Onset  . Other Neg Hx   . Alcohol abuse Neg Hx   . Arthritis Neg Hx   . Asthma Neg Hx   . Birth defects Neg Hx   . Cancer Neg Hx   . COPD Neg Hx   . Depression Neg Hx   . Diabetes Neg Hx   . Drug abuse Neg Hx   . Early death Neg Hx   . Heart disease Neg Hx   . Hearing loss Neg Hx   . Hyperlipidemia Neg Hx   . Hypertension Neg Hx   . Kidney  disease Neg Hx   . Learning disabilities Neg Hx   . Mental illness Neg Hx   . Mental retardation Neg Hx   . Miscarriages / Stillbirths Neg Hx   . Stroke Neg Hx   . Vision loss Neg Hx     Social History   Tobacco Use  . Smoking status: Former Research scientist (life sciences)  . Smokeless tobacco: Never Used  Substance Use Topics  . Alcohol use: No  . Drug use: Not Currently    Frequency: 14.0 times per week    Types: Marijuana    Comment: Reports not currently smoking    Allergies:  Allergies  Allergen Reactions  . Latex Rash    Burning sensation    Medications Prior to Admission  Medication Sig Dispense Refill Last Dose  . fluticasone (FLONASE) 50 MCG/ACT nasal spray Place 1 spray into both nostrils daily.   11/30/2017 at Unknown time  . cyclobenzaprine (FLEXERIL) 10  MG tablet Take 1 tablet (10 mg total) by mouth 3 (three) times daily as needed for muscle spasms. (Patient not taking: Reported on 12/01/2017) 30 tablet 0 Not Taking at Unknown time    Review of Systems  Gastrointestinal: Negative for constipation and diarrhea.  Genitourinary: Positive for vaginal discharge.   Physical Exam   Blood pressure 127/80, pulse 84, temperature 98.2 F (36.8 C), temperature source Oral, resp. rate 16, weight 186 lb (84.4 kg), last menstrual period 03/10/2017, unknown if currently breastfeeding.  Physical Exam  Constitutional: She is oriented to person, place, and time. She appears well-developed and well-nourished.  HENT:  Head: Normocephalic.  GI: Soft.  Genitourinary:  Genitourinary Comments: NEFG; no discharge in the vagina. No blood. No CMT, suprapubic or adnexal tenderness. No evidence of lesions on labia.   Musculoskeletal: Normal range of motion.  Neurological: She is alert and oriented to person, place, and time.  Skin: Skin is warm and dry.  Psychiatric: She has a normal mood and affect.    MAU Course  Procedures  MDM -UA -wet prep: positive for BV.  -GC chlamydia cultures repeated  since last cultures were done end of April.  -NST: 140 bpm, mod var, present acel, neg decels, occasional irregular contractions.   Assessment and Plan   1. Bacterial vaginosis.   2. Discharge home with RX for Flagyl.   3. Repeat GC cultures pending.   4. Keep appt on Thursday, 12-04-2017.   5. Reviewed warning signs of SROM and when to return to MAU (bleeding, LOF, decreased fetal movements).   6. All questions answered.   Mervyn Skeeters Sonia Allen 12/01/2017, 11:34 AM

## 2017-12-01 NOTE — Discharge Instructions (Signed)

## 2017-12-02 LAB — GC/CHLAMYDIA PROBE AMP (~~LOC~~) NOT AT ARMC
Chlamydia: NEGATIVE
Neisseria Gonorrhea: NEGATIVE

## 2017-12-04 ENCOUNTER — Ambulatory Visit (INDEPENDENT_AMBULATORY_CARE_PROVIDER_SITE_OTHER): Payer: 59 | Admitting: Obstetrics and Gynecology

## 2017-12-04 VITALS — BP 136/74 | HR 87 | Wt 188.0 lb

## 2017-12-04 DIAGNOSIS — Z3480 Encounter for supervision of other normal pregnancy, unspecified trimester: Secondary | ICD-10-CM

## 2017-12-04 NOTE — Progress Notes (Signed)
Prenatal Visit Note Date: 12/04/2017 Clinic: [facility]  Subjective:  Sonia Allen is a 32 y.o. [id number] at [redacted]w[redacted]d being seen today for ongoing prenatal care.  She is currently monitored for the following issues for this low-risk pregnancy and has Uterine fibroids affecting pregnancy; Supervision of other normal pregnancy, antepartum; Marijuana use; and Group B Streptococcus carrier, +RV culture, currently pregnant on their problem list.  Patient reports no complaints.   Contractions: Irritability. Vag. Bleeding: None.  Movement: Present. Denies leaking of fluid.   The following portions of the patient's history were reviewed and updated as appropriate: allergies, current medications, past family history, past medical history, past social history, past surgical history and problem list. Problem list updated.  Objective:   Vitals:   12/04/17 1624  BP: 136/74  Pulse: 87  Weight: 188 lb (85.3 kg)    Fetal Status: Fetal Heart Rate (bpm): 140   Movement: Present     General:  Alert, oriented and cooperative. Patient is in no acute distress.  Skin: Skin is warm and dry. No rash noted.   Cardiovascular: Normal heart rate noted  Respiratory: Normal respiratory effort, no problems with respiration noted  Abdomen: Soft, gravid, appropriate for gestational age. Pain/Pressure: Present     Pelvic:  Cervical exam deferred        Extremities: Normal range of motion.  Edema: Trace  Mental Status: Normal mood and affect. Normal behavior. Normal judgment and thought content.   Urinalysis:      Assessment and Plan:  Pregnancy: [id number] at [redacted]w[redacted]d  Routine care. D/w pt that couldn't do BTL until 6/22 GBS pos: tx in labor  Preterm labor symptoms and general obstetric precautions including but not limited to vaginal bleeding, contractions, leaking of fluid and fetal movement were reviewed in detail with the patient. Please refer to After Visit Summary for other counseling  recommendations.  RTC: 1wk rob   Aletha Halim, MD

## 2017-12-07 ENCOUNTER — Encounter (HOSPITAL_COMMUNITY): Payer: Self-pay

## 2017-12-07 ENCOUNTER — Other Ambulatory Visit: Payer: Self-pay

## 2017-12-07 ENCOUNTER — Inpatient Hospital Stay (HOSPITAL_COMMUNITY)
Admission: AD | Admit: 2017-12-07 | Discharge: 2017-12-07 | Disposition: A | Discharge status: Home / Self Care | Payer: 59 | Source: Ambulatory Visit | Attending: Obstetrics & Gynecology | Admitting: Obstetrics & Gynecology

## 2017-12-07 ENCOUNTER — Inpatient Hospital Stay (HOSPITAL_COMMUNITY): Payer: 59

## 2017-12-07 DIAGNOSIS — O1203 Gestational edema, third trimester: Secondary | ICD-10-CM | POA: Diagnosis not present

## 2017-12-07 DIAGNOSIS — R600 Localized edema: Secondary | ICD-10-CM

## 2017-12-07 DIAGNOSIS — O9989 Other specified diseases and conditions complicating pregnancy, childbirth and the puerperium: Secondary | ICD-10-CM

## 2017-12-07 DIAGNOSIS — Z3A39 39 weeks gestation of pregnancy: Secondary | ICD-10-CM | POA: Diagnosis not present

## 2017-12-07 DIAGNOSIS — Z87891 Personal history of nicotine dependence: Secondary | ICD-10-CM | POA: Insufficient documentation

## 2017-12-07 DIAGNOSIS — M79672 Pain in left foot: Secondary | ICD-10-CM | POA: Insufficient documentation

## 2017-12-07 LAB — URINALYSIS, ROUTINE W REFLEX MICROSCOPIC
BILIRUBIN URINE: NEGATIVE
Glucose, UA: NEGATIVE mg/dL
Ketones, ur: NEGATIVE mg/dL
LEUKOCYTES UA: NEGATIVE
Nitrite: NEGATIVE
PROTEIN: NEGATIVE mg/dL
Specific Gravity, Urine: 1.026 (ref 1.005–1.030)
pH: 5 (ref 5.0–8.0)

## 2017-12-07 NOTE — Discharge Instructions (Signed)
RICE for Routine Care of Injuries Many injuries can be cared for using rest, ice, compression, and elevation (RICE therapy). Using RICE therapy can help to lessen pain and swelling. It can help your body to heal. Rest Reduce your normal activities and avoid using the injured part of your body. You can go back to your normal activities when you feel okay and your doctor says it is okay. Ice Do not put ice on your bare skin.  Put ice in a plastic bag.  Place a towel between your skin and the bag.  Leave the ice on for 20 minutes, 2-3 times a day.  Do this for as long as told by your doctor. Compression Compression means putting pressure on the injured area. This can be done with an elastic bandage. If an elastic bandage has been applied:  Remove and reapply the bandage every 3-4 hours or as told by your doctor.  Make sure the bandage is not wrapped too tight. Wrap the bandage more loosely if part of your body beyond the bandage is blue, swollen, cold, painful, or loses feeling (numb).  See your doctor if the bandage seems to make your problems worse.  Elevation Elevation means keeping the injured area raised. Raise the injured area above your heart or the center of your chest if you can. When should I get help? You should get help if:  You keep having pain and swelling.  Your symptoms get worse.  Get help right away if: You should get help right away if:  You have sudden bad pain at or below the area of your injury.  You have redness or more swelling around your injury.  You have tingling or numbness at or below the injury that does not go away when you take off the bandage.  This information is not intended to replace advice given to you by your health care provider. Make sure you discuss any questions you have with your health care provider. Document Released: 12/04/2007 Document Revised: 05/14/2016 Document Reviewed: 05/25/2014 Elsevier Interactive Patient Education  2017  [employer]  

## 2017-12-07 NOTE — MAU Note (Signed)
Pt. States her left foot has been swollen since Friday.  The pt.'s left foot is slightly swollen and appears red and warm to touch.  Pt. States it was itching a few days ago, but has stopped now.  Pt. States the swelling is painful.  States she has tried elevating her feet, but it hasn't brought any relief.

## 2017-12-07 NOTE — MAU Note (Signed)
Patient c/o  Left foot swelling Tingles and painful with walking

## 2017-12-07 NOTE — MAU Provider Note (Signed)
History     CSN: [id number]  Arrival date and time: 12/07/17 1301   First Provider Initiated Contact with Patient 12/07/17 1413      Chief Complaint  Patient presents with  . Foot Pain   Foot Pain  This is a new problem. Episode onset: 3 days ago. The problem occurs constantly. The problem has been gradually worsening. Associated symptoms comments: Foot swelling. The symptoms are aggravated by standing and walking. She has tried relaxation and heat (foot massage, foot bath) for the symptoms. The treatment provided no relief.   Past Medical History:  Diagnosis Date  . Anemia    first and sec pregnancies  . Arrhythmia   . Chlamydia   . Gestational diabetes    diet controlled  . Gonorrhea   . Headache(784.0)   . Hemorrhoids   . Hx of migraine headaches   . Leiomyoma of uterus in pregnancy     Past Surgical History:  Procedure Laterality Date  . INDUCED ABORTION  March  2 ,2013  . WISDOM TOOTH EXTRACTION      Family History  Problem Relation Age of Onset  . Other Neg Hx   . Alcohol abuse Neg Hx   . Arthritis Neg Hx   . Asthma Neg Hx   . Birth defects Neg Hx   . Cancer Neg Hx   . COPD Neg Hx   . Depression Neg Hx   . Diabetes Neg Hx   . Drug abuse Neg Hx   . Early death Neg Hx   . Heart disease Neg Hx   . Hearing loss Neg Hx   . Hyperlipidemia Neg Hx   . Hypertension Neg Hx   . Kidney disease Neg Hx   . Learning disabilities Neg Hx   . Mental illness Neg Hx   . Mental retardation Neg Hx   . Miscarriages / Stillbirths Neg Hx   . Stroke Neg Hx   . Vision loss Neg Hx     Social History   Tobacco Use  . Smoking status: Former Research scientist (life sciences)  . Smokeless tobacco: Never Used  Substance Use Topics  . Alcohol use: No  . Drug use: Not Currently    Frequency: 14.0 times per week    Types: Marijuana    Comment: Reports not currently smoking    Allergies:  Allergies  Allergen Reactions  . Latex Rash    Burning sensation    Medications Prior to Admission   Medication Sig Dispense Refill Last Dose  . cyclobenzaprine (FLEXERIL) 10 MG tablet Take 1 tablet (10 mg total) by mouth 3 (three) times daily as needed for muscle spasms. 30 tablet 0 Past Week at Unknown time  . metroNIDAZOLE (FLAGYL) 500 MG tablet Take 1 tablet (500 mg total) by mouth 2 (two) times daily. 14 tablet 0 12/06/2017 at Unknown time  . fluticasone (FLONASE) 50 MCG/ACT nasal spray Place 1 spray into both nostrils daily.   Taking    Review of Systems Physical Exam   Blood pressure 120/76, pulse 95, temperature 98.3 F (36.8 C), temperature source Oral, resp. rate 17, weight 191 lb 1.3 oz (86.7 kg), last menstrual period 03/10/2017, SpO2 99 %, unknown if currently breastfeeding.  Physical Exam  Nursing note and vitals reviewed. Constitutional: She is oriented to person, place, and time. She appears well-developed and well-nourished. No distress.  HENT:  Head: Normocephalic.  Cardiovascular: Normal rate.  Respiratory: Effort normal.  GI: Soft. There is no tenderness.  Musculoskeletal:  Right LE: normal  ROM, no edema, no erythema present. Negative homan's sign Left LE: limited ROM in the phalanges, point tenderness on the top aspect of the foot, mild erythema seen, mild edema noted. Left calf: no edema, erythema, negative homan's sign   Neurological: She is alert and oriented to person, place, and time.  Skin: Skin is warm and dry.  Psychiatric: She has a normal mood and affect.   NST:  Baseline: 135 Variability: moderate Accels: 15x15 Decels: none Toco: irregular, patient not feeling them    Results for orders placed or performed during the hospital encounter of 12/07/17 (from the past 24 hour(s))  Urinalysis, Routine w reflex microscopic     Status: Abnormal   Collection Time: 12/07/17  1:25 PM  Result Value Ref Range   Color, Urine YELLOW YELLOW   APPearance HAZY (A) CLEAR   Specific Gravity, Urine 1.026 1.005 - 1.030   pH 5.0 5.0 - 8.0   Glucose, UA NEGATIVE  NEGATIVE mg/dL   Hgb urine dipstick SMALL (A) NEGATIVE   Bilirubin Urine NEGATIVE NEGATIVE   Ketones, ur NEGATIVE NEGATIVE mg/dL   Protein, ur NEGATIVE NEGATIVE mg/dL   Nitrite NEGATIVE NEGATIVE   Leukocytes, UA NEGATIVE NEGATIVE   RBC / HPF 0-5 0 - 5 RBC/hpf   WBC, UA 0-5 0 - 5 WBC/hpf   Bacteria, UA RARE (A) NONE SEEN   Squamous Epithelial / LPF 0-5 0 - 5   Mucus PRESENT    Dg Foot 2 Views Left  Result Date: 12/07/2017 CLINICAL DATA:  Soft tissue swelling with inability to bear weight. Pain. EXAM: LEFT FOOT - 2 VIEW COMPARISON:  None. FINDINGS: Frontal and lateral views obtained. No evident fracture or dislocation. Joint spaces appear normal. No erosive change. No radiopaque foreign body. IMPRESSION: No fracture or dislocation. No evident radiopaque foreign body. No arthropathy evident. Electronically Signed   By: Lowella Grip III M.D.   On: 12/07/2017 15:29   MAU Course  Procedures  MDM   Assessment and Plan   1. Foot pain, left   2. Pedal edema   3. [redacted] weeks gestation of pregnancy    DC home Comfort measures reviewed  3rd Trimester precautions  RICE to the foot  Labor precautions  Fetal kick counts RX: none Return to MAU as needed FU with OB as planned  [facility] Follow up.   Specialty:  Obstetrics and Gynecology Contact information: [place] 12/07/2017, 2:13 PM

## 2017-12-08 ENCOUNTER — Ambulatory Visit (INDEPENDENT_AMBULATORY_CARE_PROVIDER_SITE_OTHER): Payer: 59

## 2017-12-08 ENCOUNTER — Other Ambulatory Visit: Payer: Self-pay | Admitting: *Deleted

## 2017-12-08 VITALS — BP 133/80 | HR 89 | Wt 189.0 lb

## 2017-12-08 DIAGNOSIS — Z3480 Encounter for supervision of other normal pregnancy, unspecified trimester: Secondary | ICD-10-CM

## 2017-12-08 DIAGNOSIS — O99820 Streptococcus B carrier state complicating pregnancy: Secondary | ICD-10-CM

## 2017-12-08 DIAGNOSIS — Z3483 Encounter for supervision of other normal pregnancy, third trimester: Secondary | ICD-10-CM

## 2017-12-08 NOTE — Progress Notes (Signed)
   PRENATAL VISIT NOTE  Subjective:  Sonia Allen is a 32 y.o. [phone] at [redacted]w[redacted]d being seen today for ongoing prenatal care.  She is currently monitored for the following issues for this high-risk pregnancy and has Uterine fibroids affecting pregnancy; Supervision of other normal pregnancy, antepartum; Marijuana use; and Group B Streptococcus carrier, +RV culture, currently pregnant on their problem list.  Patient reports feet swelling..  Contractions: Irregular. Vag. Bleeding: None.  Movement: Present. Denies leaking of fluid.   The following portions of the patient's history were reviewed and updated as appropriate: allergies, current medications, past family history, past medical history, past social history, past surgical history and problem list. Problem list updated.  Objective:   Vitals:   12/08/17 1821  BP: 133/80  Pulse: 89  Weight: 189 lb (85.7 kg)    Fetal Status: Fetal Heart Rate (bpm): 138 Fundal Height: 39 cm Movement: Present     General:  Alert, oriented and cooperative. Patient is in no acute distress.  Skin: Skin is warm and dry. No rash noted.   Cardiovascular: Normal heart rate noted  Respiratory: Normal respiratory effort, no problems with respiration noted  Abdomen: Soft, gravid, appropriate for gestational age.  Pain/Pressure: Present     Pelvic: Cervical exam performed Dilation: 1.5 Effacement (%): 50 Station: -3  Extremities: Normal range of motion.  Edema: Trace  Mental Status: Normal mood and affect. Normal behavior. Normal judgment and thought content.   Assessment and Plan:  Pregnancy: [id number] at [redacted]w[redacted]d  1. Supervision of other normal pregnancy, antepartum -Patient requesting to have note to be out of work. FMLA paperwork given to staff today. -No pregnancy complaints.  2. Group B Streptococcus carrier, +RV culture, currently pregnant  Term labor symptoms and general obstetric precautions including but not limited to vaginal bleeding, contractions,  leaking of fluid and fetal movement were reviewed in detail with the patient. Please refer to After Visit Summary for other counseling recommendations.  Return in about 1 week (around 12/15/2017) for Return OB visit.  Future Appointments  Date Time Provider [place]  12/16/2017  2:35 PM Degele, Jenne Pane, MD [place], [place]  12/08/17 6:52 PM

## 2017-12-08 NOTE — Patient Instructions (Signed)

## 2017-12-14 ENCOUNTER — Encounter (HOSPITAL_COMMUNITY): Payer: Self-pay

## 2017-12-14 ENCOUNTER — Other Ambulatory Visit: Payer: Self-pay

## 2017-12-14 ENCOUNTER — Inpatient Hospital Stay (HOSPITAL_COMMUNITY)
Admission: AD | Admit: 2017-12-14 | Discharge: 2017-12-14 | Disposition: A | Discharge status: Home / Self Care | Payer: 59 | Source: Ambulatory Visit | Attending: Obstetrics and Gynecology | Admitting: Obstetrics and Gynecology

## 2017-12-14 DIAGNOSIS — O99820 Streptococcus B carrier state complicating pregnancy: Secondary | ICD-10-CM

## 2017-12-14 DIAGNOSIS — O471 False labor at or after 37 completed weeks of gestation: Secondary | ICD-10-CM

## 2017-12-14 DIAGNOSIS — F1290 Cannabis use, unspecified, uncomplicated: Secondary | ICD-10-CM

## 2017-12-14 DIAGNOSIS — Z3A00 Weeks of gestation of pregnancy not specified: Secondary | ICD-10-CM | POA: Insufficient documentation

## 2017-12-14 DIAGNOSIS — O480 Post-term pregnancy: Secondary | ICD-10-CM | POA: Diagnosis not present

## 2017-12-14 NOTE — MAU Note (Signed)
Pt. States her ctx started around 0500 this morning.  Pt. Denies leaking fluid or bleeding.

## 2017-12-14 NOTE — MAU Note (Signed)
I have communicated with Daiva Nakayama CNM and reviewed vital signs:  Vitals:   12/14/17 0735 12/14/17 0939  BP: 131/81 124/86  Pulse:  83  Resp: 20 20  Temp: 98.1 F (36.7 C)   SpO2: 100%     Vaginal exam:  Dilation: 2 Effacement (%): 50 Cervical Position: Posterior Station: -3 Presentation: Undeterminable Exam by:: Ginger Morris RN,   Also reviewed contraction pattern and that non-stress test is reactive.  It has been documented that patient is contracting every 4-6 minutes with no cervical change over 1.5 hours not indicating active labor.  Patient denies any other complaints.  Based on this report provider has given order for discharge.  A discharge order and diagnosis entered by a provider.   Labor discharge instructions reviewed with patient.

## 2017-12-14 NOTE — Discharge Instructions (Signed)
Braxton Hicks Contractions °Contractions of the uterus can occur throughout pregnancy, but they are not always a sign that you are in labor. You may have practice contractions called Braxton Hicks contractions. These false labor contractions are sometimes confused with true labor. °What are Braxton Hicks contractions? °Braxton Hicks contractions are tightening movements that occur in the muscles of the uterus before labor. Unlike true labor contractions, these contractions do not result in opening (dilation) and thinning of the cervix. Toward the end of pregnancy (32-34 weeks), Braxton Hicks contractions can happen more often and may become stronger. These contractions are sometimes difficult to tell apart from true labor because they can be very uncomfortable. You should not feel embarrassed if you go to the hospital with false labor. °Sometimes, the only way to tell if you are in true labor is for your health care provider to look for changes in the cervix. The health care provider will do a physical exam and may monitor your contractions. If you are not in true labor, the exam should show that your cervix is not dilating and your water has not broken. °If there are other health problems associated with your pregnancy, it is completely safe for you to be sent home with false labor. You may continue to have Braxton Hicks contractions until you go into true labor. °How to tell the difference between true labor and false labor °True labor °· Contractions last 30-70 seconds. °· Contractions become very regular. °· Discomfort is usually felt in the top of the uterus, and it spreads to the lower abdomen and low back. °· Contractions do not go away with walking. °· Contractions usually become more intense and increase in frequency. °· The cervix dilates and gets thinner. °False labor °· Contractions are usually shorter and not as strong as true labor contractions. °· Contractions are usually irregular. °· Contractions  are often felt in the front of the lower abdomen and in the groin. °· Contractions may go away when you walk around or change positions while lying down. °· Contractions get weaker and are shorter-lasting as time goes on. °· The cervix usually does not dilate or become thin. °Follow these instructions at home: °· Take over-the-counter and prescription medicines only as told by your health care provider. °· Keep up with your usual exercises and follow other instructions from your health care provider. °· Eat and drink lightly if you think you are going into labor. °· If Braxton Hicks contractions are making you uncomfortable: °? Change your position from lying down or resting to walking, or change from walking to resting. °? Sit and rest in a tub of warm water. °? Drink enough fluid to keep your urine pale yellow. Dehydration may cause these contractions. °? Do slow and deep breathing several times an hour. °· Keep all follow-up prenatal visits as told by your health care provider. This is important. °Contact a health care provider if: °· You have a fever. °· You have continuous pain in your abdomen. °Get help right away if: °· Your contractions become stronger, more regular, and closer together. °· You have fluid leaking or gushing from your vagina. °· You pass blood-tinged mucus (bloody show). °· You have bleeding from your vagina. °· You have low back pain that you never had before. °· You feel your baby’s head pushing down and causing pelvic pressure. °· Your baby is not moving inside you as much as it used to. °Summary °· Contractions that occur before labor are called Braxton   Hicks contractions, false labor, or practice contractions. °· Braxton Hicks contractions are usually shorter, weaker, farther apart, and less regular than true labor contractions. True labor contractions usually become progressively stronger and regular and they become more frequent. °· Manage discomfort from Braxton Hicks contractions by  changing position, resting in a warm bath, drinking plenty of water, or practicing deep breathing. °This information is not intended to replace advice given to you by your health care provider. Make sure you discuss any questions you have with your health care provider. °Document Released: 10/31/2016 Document Revised: 10/31/2016 Document Reviewed: 10/31/2016 °Elsevier Interactive Patient Education © 2018 [employer] ° °Fetal Movement Counts °Patient Name: ________________________________________________ Patient Due Date: ____________________ °What is a fetal movement count? °A fetal movement count is the number of times that you feel your baby move during a certain amount of time. This may also be called a fetal kick count. A fetal movement count is recommended for every pregnant woman. You may be asked to start counting fetal movements as early as week 28 of your pregnancy. °Pay attention to when your baby is most active. You may notice your baby's sleep and wake cycles. You may also notice things that make your baby move more. You should do a fetal movement count: °· When your baby is normally most active. °· At the same time each day. ° °A good time to count movements is while you are resting, after having something to eat and drink. °How do I count fetal movements? °1. Find a quiet, comfortable area. Sit, or lie down on your side. °2. Write down the date, the start time and stop time, and the number of movements that you felt between those two times. Take this information with you to your health care visits. °3. For 2 hours, count kicks, flutters, swishes, rolls, and jabs. You should feel at least 10 movements during 2 hours. °4. You may stop counting after you have felt 10 movements. °5. If you do not feel 10 movements in 2 hours, have something to eat and drink. Then, keep resting and counting for 1 hour. If you feel at least 4 movements during that hour, you may stop counting. °Contact a health care  provider if: °· You feel fewer than 4 movements in 2 hours. °· Your baby is not moving like he or she usually does. °Date: ____________ Start time: ____________ Stop time: ____________ Movements: ____________ °Date: ____________ Start time: ____________ Stop time: ____________ Movements: ____________ °Date: ____________ Start time: ____________ Stop time: ____________ Movements: ____________ °Date: ____________ Start time: ____________ Stop time: ____________ Movements: ____________ °Date: ____________ Start time: ____________ Stop time: ____________ Movements: ____________ °Date: ____________ Start time: ____________ Stop time: ____________ Movements: ____________ °Date: ____________ Start time: ____________ Stop time: ____________ Movements: ____________ °Date: ____________ Start time: ____________ Stop time: ____________ Movements: ____________ °Date: ____________ Start time: ____________ Stop time: ____________ Movements: ____________ °This information is not intended to replace advice given to you by your health care provider. Make sure you discuss any questions you have with your health care provider. °Document Released: 07/17/2006 Document Revised: 02/14/2016 Document Reviewed: 07/27/2015 °Elsevier Interactive Patient Education © 2018 Elsevier Inc[employer] ° °

## 2017-12-14 NOTE — MAU Note (Signed)
Urine in lab 

## 2017-12-16 ENCOUNTER — Inpatient Hospital Stay (HOSPITAL_COMMUNITY): Payer: 59 | Admitting: Anesthesiology

## 2017-12-16 ENCOUNTER — Other Ambulatory Visit: Payer: Self-pay

## 2017-12-16 ENCOUNTER — Other Ambulatory Visit: Payer: Self-pay | Admitting: Family Medicine

## 2017-12-16 ENCOUNTER — Inpatient Hospital Stay (HOSPITAL_COMMUNITY)
Admission: AD | Admit: 2017-12-16 | Discharge: 2017-12-19 | DRG: 807 | Disposition: A | Discharge status: Home / Self Care | Payer: 59 | Attending: Family Medicine | Admitting: Family Medicine

## 2017-12-16 ENCOUNTER — Ambulatory Visit (HOSPITAL_COMMUNITY)
Admission: RE | Admit: 2017-12-16 | Discharge: 2017-12-16 | Disposition: A | Discharge status: Home / Self Care | Payer: 59 | Source: Ambulatory Visit | Attending: Obstetrics & Gynecology | Admitting: Obstetrics & Gynecology

## 2017-12-16 ENCOUNTER — Ambulatory Visit (INDEPENDENT_AMBULATORY_CARE_PROVIDER_SITE_OTHER): Payer: 59 | Admitting: Family Medicine

## 2017-12-16 ENCOUNTER — Encounter (HOSPITAL_COMMUNITY): Payer: Self-pay

## 2017-12-16 VITALS — BP 136/80 | HR 79 | Wt 189.0 lb

## 2017-12-16 DIAGNOSIS — D259 Leiomyoma of uterus, unspecified: Secondary | ICD-10-CM

## 2017-12-16 DIAGNOSIS — O99820 Streptococcus B carrier state complicating pregnancy: Secondary | ICD-10-CM

## 2017-12-16 DIAGNOSIS — O480 Post-term pregnancy: Principal | ICD-10-CM | POA: Diagnosis present

## 2017-12-16 DIAGNOSIS — Z3480 Encounter for supervision of other normal pregnancy, unspecified trimester: Secondary | ICD-10-CM

## 2017-12-16 DIAGNOSIS — O3413 Maternal care for benign tumor of corpus uteri, third trimester: Secondary | ICD-10-CM

## 2017-12-16 DIAGNOSIS — Z3685 Encounter for antenatal screening for Streptococcus B: Secondary | ICD-10-CM | POA: Insufficient documentation

## 2017-12-16 DIAGNOSIS — Z3A40 40 weeks gestation of pregnancy: Secondary | ICD-10-CM

## 2017-12-16 DIAGNOSIS — O99824 Streptococcus B carrier state complicating childbirth: Secondary | ICD-10-CM | POA: Diagnosis present

## 2017-12-16 DIAGNOSIS — Z87891 Personal history of nicotine dependence: Secondary | ICD-10-CM | POA: Diagnosis not present

## 2017-12-16 DIAGNOSIS — O09293 Supervision of pregnancy with other poor reproductive or obstetric history, third trimester: Secondary | ICD-10-CM | POA: Insufficient documentation

## 2017-12-16 DIAGNOSIS — O9902 Anemia complicating childbirth: Secondary | ICD-10-CM | POA: Diagnosis present

## 2017-12-16 DIAGNOSIS — Z3483 Encounter for supervision of other normal pregnancy, third trimester: Secondary | ICD-10-CM

## 2017-12-16 DIAGNOSIS — O134 Gestational [pregnancy-induced] hypertension without significant proteinuria, complicating childbirth: Secondary | ICD-10-CM | POA: Diagnosis present

## 2017-12-16 DIAGNOSIS — O139 Gestational [pregnancy-induced] hypertension without significant proteinuria, unspecified trimester: Secondary | ICD-10-CM

## 2017-12-16 DIAGNOSIS — Z8632 Personal history of gestational diabetes: Secondary | ICD-10-CM

## 2017-12-16 DIAGNOSIS — D649 Anemia, unspecified: Secondary | ICD-10-CM | POA: Diagnosis present

## 2017-12-16 LAB — CBC
HCT: 30.5 % — ABNORMAL LOW (ref 36.0–46.0)
Hemoglobin: 10.1 g/dL — ABNORMAL LOW (ref 12.0–15.0)
MCH: 28.2 pg (ref 26.0–34.0)
MCHC: 33.1 g/dL (ref 30.0–36.0)
MCV: 85.2 fL (ref 78.0–100.0)
PLATELETS: 212 10*3/uL (ref 150–400)
RBC: 3.58 MIL/uL — ABNORMAL LOW (ref 3.87–5.11)
RDW: 16 % — AB (ref 11.5–15.5)
WBC: 6 10*3/uL (ref 4.0–10.5)

## 2017-12-16 LAB — RAPID URINE DRUG SCREEN, HOSP PERFORMED
Amphetamines: NOT DETECTED
Benzodiazepines: NOT DETECTED
Cocaine: NOT DETECTED
Opiates: NOT DETECTED
Tetrahydrocannabinol: NOT DETECTED

## 2017-12-16 LAB — POCT URINALYSIS DIP (DEVICE)
BILIRUBIN URINE: NEGATIVE
GLUCOSE, UA: NEGATIVE mg/dL
Ketones, ur: NEGATIVE mg/dL
LEUKOCYTES UA: NEGATIVE
NITRITE: NEGATIVE
PH: 6.5 (ref 5.0–8.0)
Protein, ur: NEGATIVE mg/dL
Specific Gravity, Urine: 1.025 (ref 1.005–1.030)
Urobilinogen, UA: 0.2 mg/dL (ref 0.0–1.0)

## 2017-12-16 LAB — TYPE AND SCREEN
ABO/RH(D): B POS
ANTIBODY SCREEN: NEGATIVE

## 2017-12-16 LAB — ABO/RH: ABO/RH(D): B POS

## 2017-12-16 MED ORDER — LACTATED RINGERS IV SOLN
500.0000 mL | INTRAVENOUS | Status: DC | PRN
  Administered 2017-12-16 – 2017-12-17 (×2): 500 mL via INTRAVENOUS

## 2017-12-16 MED ORDER — FENTANYL 2.5 MCG/ML BUPIVACAINE 1/10 % EPIDURAL INFUSION (WH - ANES)
14.0000 mL/h | INTRAMUSCULAR | Status: DC | PRN
  Administered 2017-12-16: 14 mL/h via EPIDURAL
  Filled 2017-12-16: qty 100

## 2017-12-16 MED ORDER — FENTANYL 2.5 MCG/ML BUPIVACAINE 1/10 % EPIDURAL INFUSION (WH - ANES): 14.0000 mL/h | INTRAMUSCULAR | Status: DC | PRN

## 2017-12-16 MED ORDER — PHENYLEPHRINE 40 MCG/ML (10ML) SYRINGE FOR IV PUSH (FOR BLOOD PRESSURE SUPPORT)
80.0000 ug | PREFILLED_SYRINGE | INTRAVENOUS | Status: DC | PRN
  Filled 2017-12-16: qty 5
  Filled 2017-12-16: qty 10

## 2017-12-16 MED ORDER — ACETAMINOPHEN 325 MG PO TABS: 650.0000 mg | ORAL_TABLET | ORAL | Status: DC | PRN

## 2017-12-16 MED ORDER — TERBUTALINE SULFATE 1 MG/ML IJ SOLN
0.2500 mg | Freq: Once | INTRAMUSCULAR | Status: AC | PRN
  Administered 2017-12-17: 0.25 mg via SUBCUTANEOUS
  Filled 2017-12-16: qty 1

## 2017-12-16 MED ORDER — LACTATED RINGERS IV SOLN
500.0000 mL | Freq: Once | INTRAVENOUS | Status: AC
  Administered 2017-12-17: 500 mL via INTRAVENOUS

## 2017-12-16 MED ORDER — OXYCODONE-ACETAMINOPHEN 5-325 MG PO TABS: 2.0000 | ORAL_TABLET | ORAL | Status: DC | PRN

## 2017-12-16 MED ORDER — ONDANSETRON HCL 4 MG/2ML IJ SOLN
4.0000 mg | Freq: Four times a day (QID) | INTRAMUSCULAR | Status: DC | PRN
  Administered 2017-12-17: 4 mg via INTRAVENOUS
  Filled 2017-12-16: qty 2

## 2017-12-16 MED ORDER — SODIUM CHLORIDE 0.9 % IV SOLN
5.0000 10*6.[IU] | Freq: Once | INTRAVENOUS | Status: AC
  Administered 2017-12-16: 5 10*6.[IU] via INTRAVENOUS
  Filled 2017-12-16: qty 5

## 2017-12-16 MED ORDER — PENICILLIN G POT IN DEXTROSE 60000 UNIT/ML IV SOLN
3.0000 10*6.[IU] | INTRAVENOUS | Status: DC
  Administered 2017-12-16 – 2017-12-17 (×2): 3 10*6.[IU] via INTRAVENOUS
  Filled 2017-12-16 (×4): qty 50

## 2017-12-16 MED ORDER — DIPHENHYDRAMINE HCL 50 MG/ML IJ SOLN: 12.5000 mg | INTRAMUSCULAR | Status: DC | PRN

## 2017-12-16 MED ORDER — OXYTOCIN 40 UNITS IN LACTATED RINGERS INFUSION - SIMPLE MED: 2.5000 [IU]/h | INTRAVENOUS | Status: DC

## 2017-12-16 MED ORDER — SOD CITRATE-CITRIC ACID 500-334 MG/5ML PO SOLN
30.0000 mL | ORAL | Status: DC | PRN
  Filled 2017-12-16: qty 15

## 2017-12-16 MED ORDER — LACTATED RINGERS IV SOLN
INTRAVENOUS | Status: DC
  Administered 2017-12-16 – 2017-12-17 (×3): via INTRAVENOUS

## 2017-12-16 MED ORDER — PHENYLEPHRINE 40 MCG/ML (10ML) SYRINGE FOR IV PUSH (FOR BLOOD PRESSURE SUPPORT)
80.0000 ug | PREFILLED_SYRINGE | INTRAVENOUS | Status: DC | PRN
  Filled 2017-12-16: qty 5

## 2017-12-16 MED ORDER — OXYCODONE-ACETAMINOPHEN 5-325 MG PO TABS: 1.0000 | ORAL_TABLET | ORAL | Status: DC | PRN

## 2017-12-16 MED ORDER — OXYTOCIN BOLUS FROM INFUSION
500.0000 mL | Freq: Once | INTRAVENOUS | Status: AC
  Administered 2017-12-17: 500 mL via INTRAVENOUS

## 2017-12-16 MED ORDER — LIDOCAINE HCL (PF) 1 % IJ SOLN
INTRAMUSCULAR | Status: DC | PRN
  Administered 2017-12-16 (×2): 4 mL via EPIDURAL

## 2017-12-16 MED ORDER — LIDOCAINE HCL (PF) 1 % IJ SOLN
30.0000 mL | INTRAMUSCULAR | Status: DC | PRN
  Filled 2017-12-16: qty 30

## 2017-12-16 MED ORDER — EPHEDRINE 5 MG/ML INJ
10.0000 mg | INTRAVENOUS | Status: DC | PRN
  Filled 2017-12-16: qty 2

## 2017-12-16 MED ORDER — FENTANYL CITRATE (PF) 100 MCG/2ML IJ SOLN
100.0000 ug | INTRAMUSCULAR | Status: DC | PRN
  Administered 2017-12-16: 100 ug via INTRAVENOUS
  Filled 2017-12-16: qty 2

## 2017-12-16 MED ORDER — OXYTOCIN 40 UNITS IN LACTATED RINGERS INFUSION - SIMPLE MED
1.0000 m[IU]/min | INTRAVENOUS | Status: DC
  Administered 2017-12-16: 2 m[IU]/min via INTRAVENOUS
  Filled 2017-12-16: qty 1000

## 2017-12-16 NOTE — MAU Note (Signed)
Urine sent to lab 

## 2017-12-16 NOTE — Progress Notes (Signed)
   PRENATAL VISIT NOTE  Subjective:  Sonia Allen is a 32 y.o. [id number] at [redacted]w[redacted]d being seen today for ongoing prenatal care.  She is currently monitored for the following issues for this low-risk pregnancy and has Uterine fibroids affecting pregnancy; Supervision of other normal pregnancy, antepartum; Marijuana use; Group B Streptococcus carrier, +RV culture, currently pregnant; and Encounter for induction of labor on their problem list.  Patient reports no complaints.  Contractions: Irregular. Vag. Bleeding: None.  Movement: Present. Denies leaking of fluid.   The following portions of the patient's history were reviewed and updated as appropriate: allergies, current medications, past family history, past medical history, past social history, past surgical history and problem list. Problem list updated.  Objective:   Vitals:   12/16/17 1503  BP: 136/80  Pulse: 79  Weight: 189 lb (85.7 kg)    Fetal Status: Fetal Heart Rate (bpm): NST   Movement: Present  Presentation: Vertex  General:  Alert, oriented and cooperative. Patient is in no acute distress.  Skin: Skin is warm and dry. No rash noted.   Cardiovascular: Normal heart rate noted  Respiratory: Normal respiratory effort, no problems with respiration noted  Abdomen: Soft, gravid, appropriate for gestational age.  Pain/Pressure: Present     Pelvic: Cervical exam performed Dilation: 1.5 Effacement (%): Thick Station: -3  Extremities: Normal range of motion.  Edema: Trace  Mental Status: Normal mood and affect. Normal behavior. Normal judgment and thought content.   Assessment and Plan:  Pregnancy: [id number] at [redacted]w[redacted]d  1. Supervision of other normal pregnancy, antepartum Will do BPP for postdates today Schedule IOL at 41 weeks.   2. Group B Streptococcus carrier, +RV culture, currently pregnant Will need IAP  3. Post-term pregnancy, 40-42 weeks of gestation - [place] MFM FETAL BPP W/NONSTRESS; Future - Fetal nonstress test;  Future  Term labor symptoms and general obstetric precautions including but not limited to vaginal bleeding, contractions, leaking of fluid and fetal movement were reviewed in detail with the patient. Please refer to After Visit Summary for other counseling recommendations.  No follow-ups on file.  No future appointments.  Gailen Shelter, MD

## 2017-12-16 NOTE — Anesthesia Procedure Notes (Signed)
Epidural Patient location during procedure: OB  Staffing Anesthesiologist: Justise Ehmann, MD Performed: anesthesiologist   Preanesthetic Checklist Completed: patient identified, pre-op evaluation, timeout performed, IV checked, risks and benefits discussed and monitors and equipment checked  Epidural Patient position: sitting Prep: site prepped and draped and DuraPrep Patient monitoring: heart rate, continuous pulse ox and blood pressure Approach: midline Location: L3-L4 Injection technique: LOR air and LOR saline  Needle:  Needle type: Tuohy  Needle gauge: 17 G Needle length: 9 cm Needle insertion depth: 6 cm Catheter type: closed end flexible Catheter size: 19 Gauge Catheter at skin depth: 11 cm Test dose: negative  Assessment Sensory level: T8 Events: blood not aspirated, injection not painful, no injection resistance, negative IV test and no paresthesia  Additional Notes Reason for block:procedure for pain     

## 2017-12-16 NOTE — Progress Notes (Signed)
Patient scheduled for induction 12/20/2017 at 730 am.

## 2017-12-16 NOTE — MAU Note (Signed)
Pt states she was sent from office.

## 2017-12-16 NOTE — MAU Provider Note (Signed)
History     CSN: [id number]  Arrival date and time: 12/16/17 1704   First Provider Initiated Contact with Patient 12/16/17 1800      No chief complaint on file.  HPI [id number] at [redacted]w[redacted]d presenting for postdates BPP. BPP 6/8. Pregnancy complicated by fibroids and + GBS culture.  OB History    Gravida  8   Para  4   Term  4   Preterm  0   AB  3   Living  4     SAB  1   TAB  2   Ectopic  0   Multiple  0   Live Births  4           Past Medical History:  Diagnosis Date  . Anemia    first and sec pregnancies  . Arrhythmia   . Chlamydia   . Gestational diabetes    diet controlled  . Gonorrhea   . Headache(784.0)   . Hemorrhoids   . Hx of migraine headaches   . Leiomyoma of uterus in pregnancy     Past Surgical History:  Procedure Laterality Date  . INDUCED ABORTION  March  2 ,2013  . WISDOM TOOTH EXTRACTION      Family History  Problem Relation Age of Onset  . Other Neg Hx   . Alcohol abuse Neg Hx   . Arthritis Neg Hx   . Asthma Neg Hx   . Birth defects Neg Hx   . Cancer Neg Hx   . COPD Neg Hx   . Depression Neg Hx   . Diabetes Neg Hx   . Drug abuse Neg Hx   . Early death Neg Hx   . Heart disease Neg Hx   . Hearing loss Neg Hx   . Hyperlipidemia Neg Hx   . Hypertension Neg Hx   . Kidney disease Neg Hx   . Learning disabilities Neg Hx   . Mental illness Neg Hx   . Mental retardation Neg Hx   . Miscarriages / Stillbirths Neg Hx   . Stroke Neg Hx   . Vision loss Neg Hx     Social History   Tobacco Use  . Smoking status: Former Research scientist (life sciences)  . Smokeless tobacco: Never Used  Substance Use Topics  . Alcohol use: No  . Drug use: Not Currently    Frequency: 14.0 times per week    Types: Marijuana    Comment: Reports not currently smoking    Allergies:  Allergies  Allergen Reactions  . Latex Rash    Burning sensation    Medications Prior to Admission  Medication Sig Dispense Refill Last Dose  . cyclobenzaprine (FLEXERIL) 10 MG  tablet Take 1 tablet (10 mg total) by mouth 3 (three) times daily as needed for muscle spasms. 30 tablet 0 Taking  . fluticasone (FLONASE) 50 MCG/ACT nasal spray Place 1 spray into both nostrils daily.   Taking  . metroNIDAZOLE (FLAGYL) 500 MG tablet Take 1 tablet (500 mg total) by mouth 2 (two) times daily. (Patient not taking: Reported on 12/08/2017) 14 tablet 0 Not Taking    Review of Systems Physical Exam   Blood pressure 137/88, pulse 74, temperature 98 F (36.7 C), temperature source Oral, resp. rate 18, last menstrual period 03/10/2017, SpO2 100 %, unknown if currently breastfeeding.  Physical Exam  Nursing note and vitals reviewed. Constitutional: She is oriented to person, place, and time. She appears well-developed and well-nourished.  HENT:  Head: Normocephalic and atraumatic.  Right Ear: External ear normal.  Left Ear: External ear normal.  Eyes: Pupils are equal, round, and reactive to light. Conjunctivae are normal.  Neck: Normal range of motion. Neck supple.  Cardiovascular: Normal rate.  Respiratory: Effort normal. No respiratory distress.  GI: Soft.  Neurological: She is alert and oriented to person, place, and time.  Skin: Skin is warm and dry.  Psychiatric: She has a normal mood and affect. Her behavior is normal. Judgment and thought content normal.    MAU Course  Procedures BPP 6/8  MDM   Assessment and Plan  Will admit and induce for BPP 6/8 and her being postdates PCN for GBS prophylaxis Start pitocin and AROM when able. Desires to breast and bottlefeed. ? [place] 12/16/2017, 6:07 PM

## 2017-12-16 NOTE — Anesthesia Pain Management Evaluation Note (Signed)
  CRNA Pain Management Visit Note  Patient: Sonia Allen, 32 y.o., female  "Hello I am a member of the anesthesia team at [facility]. We have an anesthesia team available at all times to provide care throughout the hospital, including epidural management and anesthesia for C-section. I don't know your plan for the delivery whether it a natural birth, water birth, IV sedation, nitrous supplementation, doula or epidural, but we want to meet your pain goals."   1.Was your pain managed to your expectations on prior hospitalizations?   Yes   2.What is your expectation for pain management during this hospitalization?     Epidural  3.How can we help you reach that goal? Patient asking for epidural now. RN notified.  Record the patient's initial score and the patient's pain goal.   Pain: 8  Pain Goal: 5 [facility] wants you to be able to say your pain was always managed very well.  Frederich Montilla 12/16/2017

## 2017-12-16 NOTE — Anesthesia Pain Management Evaluation Note (Deleted)
  CRNA Pain Management Visit Note  Patient: Sonia Allen, 32 y.o., female  "Hello I am a member of the anesthesia team at [facility]. We have an anesthesia team available at all times to provide care throughout the hospital, including epidural management and anesthesia for C-section. I don't know your plan for the delivery whether it a natural birth, water birth, IV sedation, nitrous supplementation, doula or epidural, but we want to meet your pain goals."   1.Was your pain managed to your expectations on prior hospitalizations?   No 2 epidurals where one sided and one epidural did not work.  2.What is your expectation for pain management during this hospitalization?     Epidural  3.How can we help you reach that goal? Patient asked for epidural. RN notified  Record the patient's initial score and the patient's pain goal.   Pain: 8  Pain Goal: 8 The [facility] wants you to be able to say your pain was always managed very well.  Candita Borenstein 12/16/2017

## 2017-12-16 NOTE — H&P (Signed)
History     CSN: [id number]  Arrival date and time: 12/16/17 1704   First Provider Initiated Contact with Patient 12/16/17 1800      No chief complaint on file.  HPI [id number] at [redacted]w[redacted]d presenting for postdates BPP. BPP 6/8. Pregnancy complicated by fibroids and + GBS culture.  OB History    Gravida  8   Para  4   Term  4   Preterm  0   AB  3   Living  4     SAB  1   TAB  2   Ectopic  0   Multiple  0   Live Births  4           Past Medical History:  Diagnosis Date  . Anemia    first and sec pregnancies  . Arrhythmia   . Chlamydia   . Gestational diabetes    diet controlled  . Gonorrhea   . Headache(784.0)   . Hemorrhoids   . Hx of migraine headaches   . Leiomyoma of uterus in pregnancy     Past Surgical History:  Procedure Laterality Date  . INDUCED ABORTION  March  2 ,2013  . WISDOM TOOTH EXTRACTION      Family History  Problem Relation Age of Onset  . Other Neg Hx   . Alcohol abuse Neg Hx   . Arthritis Neg Hx   . Asthma Neg Hx   . Birth defects Neg Hx   . Cancer Neg Hx   . COPD Neg Hx   . Depression Neg Hx   . Diabetes Neg Hx   . Drug abuse Neg Hx   . Early death Neg Hx   . Heart disease Neg Hx   . Hearing loss Neg Hx   . Hyperlipidemia Neg Hx   . Hypertension Neg Hx   . Kidney disease Neg Hx   . Learning disabilities Neg Hx   . Mental illness Neg Hx   . Mental retardation Neg Hx   . Miscarriages / Stillbirths Neg Hx   . Stroke Neg Hx   . Vision loss Neg Hx     Social History   Tobacco Use  . Smoking status: Former Research scientist (life sciences)  . Smokeless tobacco: Never Used  Substance Use Topics  . Alcohol use: No  . Drug use: Not Currently    Frequency: 14.0 times per week    Types: Marijuana    Comment: Reports not currently smoking    Allergies:  Allergies  Allergen Reactions  . Latex Rash    Burning sensation    Medications Prior to Admission  Medication Sig Dispense Refill Last Dose  . cyclobenzaprine (FLEXERIL) 10 MG  tablet Take 1 tablet (10 mg total) by mouth 3 (three) times daily as needed for muscle spasms. 30 tablet 0 Taking  . fluticasone (FLONASE) 50 MCG/ACT nasal spray Place 1 spray into both nostrils daily.   Taking  . metroNIDAZOLE (FLAGYL) 500 MG tablet Take 1 tablet (500 mg total) by mouth 2 (two) times daily. (Patient not taking: Reported on 12/08/2017) 14 tablet 0 Not Taking    Review of Systems Physical Exam   Blood pressure 137/88, pulse 74, temperature 98 F (36.7 C), temperature source Oral, resp. rate 18, last menstrual period 03/10/2017, SpO2 100 %, unknown if currently breastfeeding.  Physical Exam  Nursing note and vitals reviewed. Constitutional: She is oriented to person, place, and time. She appears well-developed and well-nourished.  HENT:  Head: Normocephalic and  atraumatic.  Right Ear: External ear normal.  Left Ear: External ear normal.  Eyes: Pupils are equal, round, and reactive to light. Conjunctivae are normal.  Neck: Normal range of motion. Neck supple.  Cardiovascular: Normal rate.  Respiratory: Effort normal. No respiratory distress.  GI: Soft.  Neurological: She is alert and oriented to person, place, and time.  Skin: Skin is warm and dry.  Psychiatric: She has a normal mood and affect. Her behavior is normal. Judgment and thought content normal.    MAU Course  Procedures BPP 6/8  MDM   Assessment and Plan  Will admit and induce for BPP 6/8 and her being postdates PCN for GBS prophylaxis Start pitocin and AROM when able. Desires to breast and bottlefeed. ? [place] 12/16/2017, 6:07 PM

## 2017-12-16 NOTE — Anesthesia Preprocedure Evaluation (Signed)
Anesthesia Evaluation  Patient identified by MRN, date of birth, ID band Patient awake    Reviewed: Allergy & Precautions, H&P , Patient's Chart, lab work & pertinent test results  Airway Mallampati: II  TM Distance: >3 FB Neck ROM: full    Dental  (+) Teeth Intact   Pulmonary former smoker,    breath sounds clear to auscultation       Cardiovascular  Rhythm:regular Rate:Normal     Neuro/Psych    GI/Hepatic   Endo/Other  diabetes, Gestational  Renal/GU      Musculoskeletal   Abdominal   Peds  Hematology  (+) anemia ,   Anesthesia Other Findings       Reproductive/Obstetrics (+) Pregnancy                             Anesthesia Physical  Anesthesia Plan  ASA: II  Anesthesia Plan: Epidural   Post-op Pain Management:    Induction:   PONV Risk Score and Plan:   Airway Management Planned:   Additional Equipment:   Intra-op Plan:   Post-operative Plan:   Informed Consent: I have reviewed the patients History and Physical, chart, labs and discussed the procedure including the risks, benefits and alternatives for the proposed anesthesia with the patient or authorized representative who has indicated his/her understanding and acceptance.   Dental Advisory Given  Plan Discussed with:   Anesthesia Plan Comments:         Anesthesia Quick Evaluation

## 2017-12-17 ENCOUNTER — Encounter (HOSPITAL_COMMUNITY): Payer: Self-pay

## 2017-12-17 DIAGNOSIS — Z3A40 40 weeks gestation of pregnancy: Secondary | ICD-10-CM

## 2017-12-17 DIAGNOSIS — O139 Gestational [pregnancy-induced] hypertension without significant proteinuria, unspecified trimester: Secondary | ICD-10-CM

## 2017-12-17 DIAGNOSIS — O480 Post-term pregnancy: Secondary | ICD-10-CM

## 2017-12-17 DIAGNOSIS — O99824 Streptococcus B carrier state complicating childbirth: Secondary | ICD-10-CM

## 2017-12-17 HISTORY — DX: Gestational (pregnancy-induced) hypertension without significant proteinuria, unspecified trimester: O13.9

## 2017-12-17 LAB — PROTEIN / CREATININE RATIO, URINE
Creatinine, Urine: 166 mg/dL
Protein Creatinine Ratio: 0.08 mg/mg{Cre} (ref 0.00–0.15)
Total Protein, Urine: 13 mg/dL

## 2017-12-17 LAB — COMPREHENSIVE METABOLIC PANEL
ALBUMIN: 2.8 g/dL — AB (ref 3.5–5.0)
ALK PHOS: 90 U/L (ref 38–126)
ALT: 10 U/L — ABNORMAL LOW (ref 14–54)
ANION GAP: 11 (ref 5–15)
AST: 15 U/L (ref 15–41)
BUN: 8 mg/dL (ref 6–20)
CALCIUM: 8.3 mg/dL — AB (ref 8.9–10.3)
CO2: 20 mmol/L — ABNORMAL LOW (ref 22–32)
Chloride: 105 mmol/L (ref 101–111)
Creatinine, Ser: 0.48 mg/dL (ref 0.44–1.00)
GFR calc Af Amer: >60 mL/min (ref >60)
GFR calc non Af Amer: >60 mL/min (ref >60)
GLUCOSE: 99 mg/dL (ref 65–99)
Potassium: 3.4 mmol/L — ABNORMAL LOW (ref 3.5–5.1)
Sodium: 136 mmol/L (ref 135–145)
TOTAL PROTEIN: 6.1 g/dL — AB (ref 6.5–8.1)
Total Bilirubin: 0.8 mg/dL (ref 0.3–1.2)

## 2017-12-17 LAB — RPR: RPR Ser Ql: NONREACTIVE

## 2017-12-17 MED ORDER — SODIUM CHLORIDE 0.9 % IV SOLN
250.0000 mL | INTRAVENOUS | Status: DC | PRN
Start: 2017-12-17 — End: 2017-12-19

## 2017-12-17 MED ORDER — FLEET ENEMA 7-19 GM/118ML RE ENEM: 1.0000 | ENEMA | Freq: Every day | RECTAL | Status: DC | PRN

## 2017-12-17 MED ORDER — SODIUM CHLORIDE 0.9% FLUSH: 3.0000 mL | Freq: Two times a day (BID) | INTRAVENOUS | Status: DC

## 2017-12-17 MED ORDER — SENNOSIDES-DOCUSATE SODIUM 8.6-50 MG PO TABS
2.0000 | ORAL_TABLET | ORAL | Status: DC
  Administered 2017-12-17 – 2017-12-19 (×2): 2 via ORAL
  Filled 2017-12-17 (×2): qty 2

## 2017-12-17 MED ORDER — MEASLES, MUMPS & RUBELLA VAC ~~LOC~~ INJ
0.5000 mL | INJECTION | Freq: Once | SUBCUTANEOUS | Status: DC
  Filled 2017-12-17: qty 0.5

## 2017-12-17 MED ORDER — PRENATAL MULTIVITAMIN CH
1.0000 | ORAL_TABLET | Freq: Every day | ORAL | Status: DC
  Administered 2017-12-17 – 2017-12-19 (×3): 1 via ORAL
  Filled 2017-12-17 (×3): qty 1

## 2017-12-17 MED ORDER — SIMETHICONE 80 MG PO CHEW: 80.0000 mg | CHEWABLE_TABLET | ORAL | Status: DC | PRN

## 2017-12-17 MED ORDER — WITCH HAZEL-GLYCERIN EX PADS: 1.0000 "application " | MEDICATED_PAD | CUTANEOUS | Status: DC | PRN

## 2017-12-17 MED ORDER — DIBUCAINE 1 % RE OINT: 1.0000 "application " | TOPICAL_OINTMENT | RECTAL | Status: DC | PRN

## 2017-12-17 MED ORDER — COCONUT OIL OIL: 1.0000 "application " | TOPICAL_OIL | Status: DC | PRN

## 2017-12-17 MED ORDER — DIPHENHYDRAMINE HCL 25 MG PO CAPS
25.0000 mg | ORAL_CAPSULE | Freq: Four times a day (QID) | ORAL | Status: DC | PRN
  Administered 2017-12-17: 25 mg via ORAL
  Filled 2017-12-17: qty 1

## 2017-12-17 MED ORDER — BISACODYL 10 MG RE SUPP: 10.0000 mg | Freq: Every day | RECTAL | Status: DC | PRN

## 2017-12-17 MED ORDER — TETANUS-DIPHTH-ACELL PERTUSSIS 5-2.5-18.5 LF-MCG/0.5 IM SUSP: 0.5000 mL | Freq: Once | INTRAMUSCULAR | Status: DC

## 2017-12-17 MED ORDER — IBUPROFEN 600 MG PO TABS
600.0000 mg | ORAL_TABLET | Freq: Four times a day (QID) | ORAL | Status: DC
  Administered 2017-12-17 – 2017-12-19 (×10): 600 mg via ORAL
  Filled 2017-12-17 (×9): qty 1

## 2017-12-17 MED ORDER — ONDANSETRON HCL 4 MG PO TABS: 4.0000 mg | ORAL_TABLET | ORAL | Status: DC | PRN

## 2017-12-17 MED ORDER — ZOLPIDEM TARTRATE 5 MG PO TABS: 5.0000 mg | ORAL_TABLET | Freq: Every evening | ORAL | Status: DC | PRN

## 2017-12-17 MED ORDER — ONDANSETRON HCL 4 MG/2ML IJ SOLN: 4.0000 mg | INTRAMUSCULAR | Status: DC | PRN

## 2017-12-17 MED ORDER — ACETAMINOPHEN 325 MG PO TABS
650.0000 mg | ORAL_TABLET | ORAL | Status: DC | PRN
Start: 2017-12-17 — End: 2017-12-19
  Administered 2017-12-17 – 2017-12-18 (×3): 650 mg via ORAL
  Filled 2017-12-17 (×3): qty 2

## 2017-12-17 MED ORDER — BENZOCAINE-MENTHOL 20-0.5 % EX AERO: 1.0000 "application " | INHALATION_SPRAY | CUTANEOUS | Status: DC | PRN

## 2017-12-17 MED ORDER — SODIUM CHLORIDE 0.9% FLUSH: 3.0000 mL | INTRAVENOUS | Status: DC | PRN

## 2017-12-17 NOTE — Progress Notes (Signed)
Patient ID: Sonia Allen, female   DOB: 07-03-1985, 32 y.o.   MRN: [id number] Sonia Allen is a 32 y.o. [id number] at [redacted]w[redacted]d admitted for induction of labor due to bpp 6/8, postdates.  Subjective: Comfortable w/ epidural, no complaints  Objective: BP 123/80   Pulse 74   Temp 98.3 F (36.8 C) (Oral)   Resp 16   Ht 5\' 3"  (1.6 m)   Wt 86.2 kg (190 lb)   LMP 03/10/2017   SpO2 100%   BMI 33.66 kg/m  No intake/output data recorded.  FHT:  FHR: 125 bpm, variability: min-mod,  accelerations:  Present,  decelerations:  Present occ mild variable UC:   regular, every 2-3 minutes  SVE:   Dilation: 5.5 Effacement (%): 90 Station: 0, Plus 1 Exam by:: Leroy Libman, RN  Pitocin @ 12 mu/min  Labs: Lab Results  Component Value Date   WBC 6.0 12/16/2017   HGB 10.1 (L) 12/16/2017   HCT 30.5 (L) 12/16/2017   MCV 85.2 12/16/2017   PLT 212 12/16/2017    Assessment / Plan: Induction of labor due to bpp 6/8, postdates,  progressing well on pitocin  Labor: Progressing normally Fetal Wellbeing:  Category II Pain Control:  Epidural Pre-eclampsia: n/a I/D:  PCN for GBS+ Anticipated MOD:  NSVD  Roma Schanz CNM, WHNP-BC 12/17/2017, 2:08 AM

## 2017-12-17 NOTE — Progress Notes (Signed)
Patient ID: Sonia Allen, female   DOB: 01/27/1986, 32 y.o.   MRN: [id number] Sonia Allen is a 32 y.o. [id number] at [redacted]w[redacted]d admitted for induction of labor due to bpp 6/8, postdates.  Called to room by nurse for decels/bleeding  Subjective: Comfortable w/ epidural, some pressure. Denies ha, visual changes, ruq/epigastric pain, n/v.    Objective: BP (!) 142/87   Pulse 73   Temp 98.3 F (36.8 C) (Oral)   Resp 16   Ht 5\' 3"  (1.6 m)   Wt 86.2 kg (190 lb)   LMP 03/10/2017   SpO2 100%   BMI 33.66 kg/m  No intake/output data recorded.  FHT:  FHR: 130 bpm, variability: minimal ,  accelerations:  Abscent,  decelerations:  Present earlies, then 2 two minute prolonged decels to nadir of 60 UC:   regular, every 2 minutes  SVE:   Dilation: 7 Effacement (%): 90 Station: Plus 1 Exam by:: Damani Kelemen CNM  Small amt BRB on towel, small clot AROM large BBOW, large amt clear fluid  Pitocin @ 12 mu/min  Labs: Lab Results  Component Value Date   WBC 6.0 12/16/2017   HGB 10.1 (L) 12/16/2017   HCT 30.5 (L) 12/16/2017   MCV 85.2 12/16/2017   PLT 212 12/16/2017    Assessment / Plan: IOL d/t bpp 6/8, postdates, progressing on pitocin, now arom'd, had 2 prolonged decels w/ small amt BRB-will monitor closely  Labor: Progressing normally Fetal Wellbeing:  Category II Pain Control:  Epidural Pre-eclampsia: few elevated bp's, will get pre-e labs I/D:  PCN for GBS+ Anticipated MOD:  NSVD  Roma Schanz CNM, WHNP-BC 12/17/2017, 3:33 AM

## 2017-12-17 NOTE — Progress Notes (Signed)
Pt stated she does not want BTL done during her hospital stay.

## 2017-12-17 NOTE — Anesthesia Postprocedure Evaluation (Signed)
Anesthesia Post Note  Patient: Sonia Allen  Procedure(s) Performed: AN AD [place]     Patient location during evaluation: Mother Baby Anesthesia Type: Epidural Level of consciousness: awake, awake and alert and oriented Pain management: pain level controlled Vital Signs Assessment: post-procedure vital signs reviewed and stable Respiratory status: spontaneous breathing Cardiovascular status: blood pressure returned to baseline Postop Assessment: no headache, epidural receding, patient able to bend at knees, adequate PO intake, no backache, no apparent nausea or vomiting and able to ambulate Anesthetic complications: no    Last Vitals:  Vitals:   12/17/17 0839 12/17/17 1307  BP: 134/76 132/78  Pulse: 81 75  Resp: 16 16  Temp: 36.8 C 36.9 C  SpO2:      Last Pain:  Vitals:   12/17/17 1307  TempSrc: Oral  PainSc: 0-No pain   Pain Goal:                 Bufford Spikes

## 2017-12-17 NOTE — Progress Notes (Signed)
Patient ID: Sonia Allen, female   DOB: Mar 05, 1986, 32 y.o.   MRN: [id number] Called by nurse for decel and 10cm S: pt comfortable, some pressure, no real urge to push O: VSS     FHR: baseline 150bpm w/ mod variability, no accels, + mild variables- then 92min prolonged to nadir of 70, position change, O2, pitocin off, FSE placed, terbutaline given w/ good response: 155 baseline, mod variability, +accel, mild variable     SVE: 10/100/+2 A: [redacted]w[redacted]d IOL for bpp 6/8, postdates P: will let baby recover, then plan to push Roma Schanz, CNM, WHNP-BC 12/17/2017 4:10 AM

## 2017-12-18 LAB — BIRTH TISSUE RECOVERY COLLECTION (PLACENTA DONATION)

## 2017-12-18 NOTE — Progress Notes (Signed)
CSW received consult for hx of marijuana use.  Referral was screened out due to the following: ~MOB had no documented substance use after initial prenatal visit/+UPT. ~MOB had no positive drug screens after initial prenatal visit/+UPT.  Please consult CSW if current concerns arise or by MOB's request.  CSW will monitor CDS results and make report to Child Protective Services if warranted.  Laurey Arrow, MSW, LCSW Clinical Social Work [phone]

## 2017-12-18 NOTE — Progress Notes (Signed)
Post Partum Day #1 Subjective: no complaints, up ad lib and tolerating PO; still wants interval BTL; bottlefeeding; BPs stable  Objective: Blood pressure 118/76, pulse 63, temperature 98.2 F (36.8 C), resp. rate 18, height 5\' 3"  (1.6 m), weight 86.2 kg (190 lb), last menstrual period 03/10/2017, SpO2 99 %, unknown if currently breastfeeding.  Physical Exam:  General: alert, cooperative and no distress Lochia: appropriate Uterine Fundus: firm DVT Evaluation: No evidence of DVT seen on physical exam.  Recent Labs    12/16/17 1846  HGB 10.1*  HCT 30.5*    Assessment/Plan: Plan for discharge tomorrow  Has BP check in 1 wk and plans for interval BTL in place   LOS: 2 days   Serita Grammes CNM 12/18/2017, 9:09 AM

## 2017-12-19 MED ORDER — AMLODIPINE BESYLATE 5 MG PO TABS
5.0000 mg | ORAL_TABLET | Freq: Every day | ORAL | Status: DC
  Filled 2017-12-19: qty 1

## 2017-12-19 MED ORDER — IBUPROFEN 600 MG PO TABS: 600.0000 mg | ORAL_TABLET | Freq: Four times a day (QID) | ORAL | 0 refills | Status: DC

## 2017-12-19 MED ORDER — AMLODIPINE BESYLATE 5 MG PO TABS: 5.0000 mg | ORAL_TABLET | Freq: Every day | ORAL | 11 refills | Status: DC

## 2017-12-19 MED ORDER — PRENATAL MULTIVITAMIN CH: 1.0000 | ORAL_TABLET | Freq: Every day | ORAL | Status: DC

## 2017-12-19 NOTE — Discharge Summary (Signed)
OB Discharge Summary     Patient Name: Sonia Allen DOB: 1985/08/31 MRN: [id number]  Date of admission: 12/16/2017 Delivering MD: Wells Guiles R   Date of discharge: 12/19/2017  Admitting diagnosis: 27 WKS, BPP Intrauterine pregnancy: [redacted]w[redacted]d     Secondary diagnosis:  Active Problems:   Encounter for induction of labor   Gestational hypertension  Additional problems: None     Discharge diagnosis: Term Pregnancy Delivered                                                                                                Post partum procedures:none  Augmentation: AROM and Pitocin  Complications: None  Hospital course:  Induction of Labor With Vaginal Delivery   32 y.o. yo [id number] at [redacted]w[redacted]d was admitted to the hospital 12/16/2017 for induction of labor.  Indication for induction: Postdates and BPP 6/8.  Patient had an uncomplicated labor course as follows: Membrane Rupture Time/Date: 3:25 AM ,12/17/2017   Intrapartum Procedures: Episiotomy: None [1]                                         Lacerations:  None [1]  Patient had delivery of a Viable infant.  Information for the patient's newborn:  Sonia, Allen [[id number]]  Delivery Method: Vaginal, Spontaneous(Filed from Delivery Summary)   12/17/2017  Details of delivery can be found in separate delivery note.  Patient had a routine postpartum course. Patient is discharged home 12/19/17.  Physical exam  Vitals:   12/18/17 1717 12/18/17 1809 12/18/17 2226 12/19/17 0055  BP: (!) 131/94 132/85 139/88 133/87  Pulse: 82 82 72 66  Resp:      Temp:   98.5 F (36.9 C)   TempSrc:   Oral   SpO2:      Weight:      Height:       General: alert, cooperative and no distress Lochia: appropriate Uterine Fundus: firm Incision: N/A DVT Evaluation: No evidence of DVT seen on physical exam. Labs: Lab Results  Component Value Date   WBC 6.0 12/16/2017   HGB 10.1 (L) 12/16/2017   HCT 30.5 (L) 12/16/2017   MCV 85.2 12/16/2017   PLT  212 12/16/2017   CMP Latest Ref Rng & Units 12/17/2017  Glucose 65 - 99 mg/dL 99  BUN 6 - 20 mg/dL 8  Creatinine 0.44 - 1.00 mg/dL 0.48  Sodium 135 - 145 mmol/L 136  Potassium 3.5 - 5.1 mmol/L 3.4(L)  Chloride 101 - 111 mmol/L 105  CO2 22 - 32 mmol/L 20(L)  Calcium 8.9 - 10.3 mg/dL 8.3(L)  Total Protein 6.5 - 8.1 g/dL 6.1(L)  Total Bilirubin 0.3 - 1.2 mg/dL 0.8  Alkaline Phos 38 - 126 U/L 90  AST 15 - 41 U/L 15  ALT 14 - 54 U/L 10(L)    Discharge instruction: per After Visit Summary and "Baby and Me Booklet".  After visit meds:  Allergies as of 12/19/2017      Reactions   Latex Rash  Burning sensation      Medication List    TAKE these medications   amLODipine 5 MG tablet Commonly known as:  NORVASC Take 1 tablet (5 mg total) by mouth daily.   cyclobenzaprine 10 MG tablet Commonly known as:  FLEXERIL Take 1 tablet (10 mg total) by mouth 3 (three) times daily as needed for muscle spasms.   fluticasone 50 MCG/ACT nasal spray Commonly known as:  FLONASE Place 1 spray into both nostrils daily.   ibuprofen 600 MG tablet Commonly known as:  ADVIL,MOTRIN Take 1 tablet (600 mg total) by mouth every 6 (six) hours.   prenatal multivitamin Tabs tablet Take 1 tablet by mouth daily at 12 noon.       Diet: routine diet  Activity: Advance as tolerated. Pelvic rest for 6 weeks.   Outpatient follow up:6 weeks Follow up Appt: Future Appointments  Date Time Provider [place]  12/25/2017  9:30 AM [place]  01/14/2018  2:35 PM Sloan Leiter, MD [facility]   Follow up Visit:No follow-ups on file.  Postpartum contraception: Tubal Ligation  Newborn Data: Live born female  Birth Weight: 7 lb 4.2 oz (3294 g) APGAR: 75, 9  Newborn Delivery   Birth date/time:  12/17/2017 05:07:00 Delivery type:  Vaginal, Spontaneous     Baby Feeding: Bottle and Breast Disposition:home with mother   12/19/2017 Sonia Hollow, MD  Patient seen and  examined, agree with above note. Patient is 48 hour pp from SVD. Some mild elevated BP's. Watched for a few more hours, and BP is stable. [place] for discharge.  Sonia Allen 12/19/2017 3:37 PM

## 2017-12-19 NOTE — Discharge Instructions (Signed)
Postpartum Hypertension °Postpartum hypertension is high blood pressure after pregnancy that remains higher than normal for more than two days after delivery. You may not realize that you have postpartum hypertension if your blood pressure is not being checked regularly. In some cases, postpartum hypertension will go away on its own, usually within a week of delivery. However, for some women, medical treatment is required to prevent serious complications, such as seizures or stroke. °The following things can affect your blood pressure: °· The type of delivery you had. °· Having received IV fluids or other medicines during or after delivery. ° °What are the causes? °Postpartum hypertension may be caused by any of the following or by a combination of any of the following: °· Hypertension that existed before pregnancy (chronic hypertension). °· Gestational hypertension. °· Preeclampsia or eclampsia. °· Receiving a lot of fluid through an IV during or after delivery. °· Medicines. °· HELLP syndrome. °· Hyperthyroidism. °· Stroke. °· Other rare neurological or blood disorders. ° °In some cases, the cause may not be known. °What increases the risk? °Postpartum hypertension can be related to one or more risk factors, such as: °· Chronic hypertension. In some cases, this may not have been diagnosed before pregnancy. °· Obesity. °· Type 2 diabetes. °· Kidney disease. °· Family history of preeclampsia. °· Other medical conditions that cause hormonal imbalances. ° °What are the signs or symptoms? °As with all types of hypertension, postpartum hypertension may not have any symptoms. Depending on how high your blood pressure is, you may experience: °· Headaches. These may be mild, moderate, or severe. They may also be steady, constant, or sudden in onset (thunderclap headache). °· Visual changes. °· Dizziness. °· Shortness of breath. °· Swelling of your hands, feet, lower legs, or face. In some cases, you may have swelling in  more than one of these locations. °· Heart palpitations or a racing heartbeat. °· Difficulty breathing while lying down. °· Decreased urination. ° °Other rare signs and symptoms may include: °· Sweating more than usual. This lasts longer than a few days after delivery. °· Chest pain. °· Sudden dizziness when you get up from sitting or lying down. °· Seizures. °· Nausea or vomiting. °· Abdominal pain. ° °How is this diagnosed? °The diagnosis of postpartum hypertension is made through a combination of physical examination findings and testing of your blood and urine. You may also have additional tests, such as a CT scan or an MRI, to check for other complications of postpartum hypertension. °How is this treated? °When blood pressure is high enough to require treatment, your options may include: °· Medicines to reduce blood pressure (antihypertensives). Tell your health care provider if you are breastfeeding or if you plan to breastfeed. There are many antihypertensive medicines that are safe to take while breastfeeding. °· Stopping medicines that may be causing hypertension. °· Treating medical conditions that are causing hypertension. °· Treating the complications of hypertension, such as seizures, stroke, or kidney problems. ° °Your health care provider will also continue to monitor your blood pressure closely and repeatedly until it is within a safe range for you. °Follow these instructions at home: °· Take medicines only as directed by your health care provider. °· Get regular exercise after your health care provider tells you that it is safe. °· Follow your health care provider’s recommendations on fluid and salt restrictions. °· Do not use any tobacco products, including cigarettes, chewing tobacco, or electronic cigarettes. If you need help quitting, ask your health care provider. °·   Keep all follow-up visits as directed by your health care provider. This is important. Contact a health care provider  if:  Your symptoms get worse.  You have new symptoms, such as: ? Headache. ? Dizziness. ? Visual changes. Get help right away if:  You develop a severe or sudden headache.  You have seizures.  You develop numbness or weakness on one side of your body.  You have difficulty thinking, speaking, or swallowing.  You develop severe abdominal pain.  You develop difficulty breathing, chest pain, a racing heartbeat, or heart palpitations. These symptoms may represent a serious problem that is an emergency. Do not wait to see if the symptoms will go away. Get medical help right away. Call your local emergency services (911 in the [place] Do not drive yourself to the hospital. This information is not intended to replace advice given to you by your health care provider. Make sure you discuss any questions you have with your health care provider. Document Released: 02/18/2014 Document Revised: 11/20/2015 Document Reviewed: 12/30/2013 Elsevier Interactive Patient Education  2018 [place]. Vaginal Delivery, Care After Refer to this sheet in the next few weeks. These instructions provide you with information about caring for yourself after vaginal delivery. Your health care provider may also give you more specific instructions. Your treatment has been planned according to current medical practices, but problems sometimes occur. Call your health care provider if you have any problems or questions. What can I expect after the procedure? After vaginal delivery, it is common to have:  Some bleeding from your vagina.  Soreness in your abdomen, your vagina, and the area of skin between your vaginal opening and your anus (perineum).  Pelvic cramps.  Fatigue.  Follow these instructions at home: Medicines  Take over-the-counter and prescription medicines only as told by your health care provider.  If you were prescribed an antibiotic medicine, take it as told by your health care provider. Do  not stop taking the antibiotic until it is finished. Driving   Do not drive or operate heavy machinery while taking prescription pain medicine.  Do not drive for 24 hours if you received a sedative. Lifestyle  Do not drink alcohol. This is especially important if you are breastfeeding or taking medicine to relieve pain.  Do not use tobacco products, including cigarettes, chewing tobacco, or e-cigarettes. If you need help quitting, ask your health care provider. Eating and drinking  Drink at least 8 eight-ounce glasses of water every day unless you are told not to by your health care provider. If you choose to breastfeed your baby, you may need to drink more water than this.  Eat high-fiber foods every day. These foods may help prevent or relieve constipation. High-fiber foods include: ? Whole grain cereals and breads. ? Brown rice. ? Beans. ? Fresh fruits and vegetables. Activity  Return to your normal activities as told by your health care provider. Ask your health care provider what activities are safe for you.  Rest as much as possible. Try to rest or take a nap when your baby is sleeping.  Do not lift anything that is heavier than your baby or 10 lb (4.5 kg) until your health care provider says that it is safe.  Talk with your health care provider about when you can engage in sexual activity. This may depend on your: ? Risk of infection. ? Rate of healing. ? Comfort and desire to engage in sexual activity. Vaginal Care  If you have an episiotomy  or a vaginal tear, check the area every day for signs of infection. Check for: ? More redness, swelling, or pain. ? More fluid or blood. ? Warmth. ? Pus or a bad smell.  Do not use tampons or douches until your health care provider says this is safe.  Watch for any blood clots that may pass from your vagina. These may look like clumps of dark red, brown, or black discharge. General instructions  Keep your perineum clean and  dry as told by your health care provider.  Wear loose, comfortable clothing.  Wipe from front to back when you use the toilet.  Ask your health care provider if you can shower or take a bath. If you had an episiotomy or a perineal tear during labor and delivery, your health care provider may tell you not to take baths for a certain length of time.  Wear a bra that supports your breasts and fits you well.  If possible, have someone help you with household activities and help care for your baby for at least a few days after you leave the hospital.  Keep all follow-up visits for you and your baby as told by your health care provider. This is important. Contact a health care provider if:  You have: ? Vaginal discharge that has a bad smell. ? Difficulty urinating. ? Pain when urinating. ? A sudden increase or decrease in the frequency of your bowel movements. ? More redness, swelling, or pain around your episiotomy or vaginal tear. ? More fluid or blood coming from your episiotomy or vaginal tear. ? Pus or a bad smell coming from your episiotomy or vaginal tear. ? A fever. ? A rash. ? Little or no interest in activities you used to enjoy. ? Questions about caring for yourself or your baby.  Your episiotomy or vaginal tear feels warm to the touch.  Your episiotomy or vaginal tear is separating or does not appear to be healing.  Your breasts are painful, hard, or turn red.  You feel unusually sad or worried.  You feel nauseous or you vomit.  You pass large blood clots from your vagina. If you pass a blood clot from your vagina, save it to show to your health care provider. Do not flush blood clots down the toilet without having your health care provider look at them.  You urinate more than usual.  You are dizzy or light-headed.  You have not breastfed at all and you have not had a menstrual period for 12 weeks after delivery.  You have stopped breastfeeding and you have not had  a menstrual period for 12 weeks after you stopped breastfeeding. Get help right away if:  You have: ? Pain that does not go away or does not get better with medicine. ? Chest pain. ? Difficulty breathing. ? Blurred vision or spots in your vision. ? Thoughts about hurting yourself or your baby.  You develop pain in your abdomen or in one of your legs.  You develop a severe headache.  You faint.  You bleed from your vagina so much that you fill two sanitary pads in one hour. This information is not intended to replace advice given to you by your health care provider. Make sure you discuss any questions you have with your health care provider. Document Released: 06/14/2000 Document Revised: 11/29/2015 Document Reviewed: 07/02/2015 Elsevier Interactive Patient Education  2018 [employer].

## 2017-12-20 ENCOUNTER — Inpatient Hospital Stay (HOSPITAL_COMMUNITY): Payer: 59

## 2017-12-25 ENCOUNTER — Ambulatory Visit: Payer: 59

## 2017-12-30 ENCOUNTER — Other Ambulatory Visit: Payer: Self-pay

## 2017-12-30 ENCOUNTER — Ambulatory Visit (INDEPENDENT_AMBULATORY_CARE_PROVIDER_SITE_OTHER): Payer: 59

## 2017-12-30 VITALS — BP 99/67 | HR 84 | Wt 166.0 lb

## 2017-12-30 DIAGNOSIS — O139 Gestational [pregnancy-induced] hypertension without significant proteinuria, unspecified trimester: Secondary | ICD-10-CM

## 2017-12-30 DIAGNOSIS — O133 Gestational [pregnancy-induced] hypertension without significant proteinuria, third trimester: Secondary | ICD-10-CM

## 2017-12-30 NOTE — Progress Notes (Signed)
Patient is here today for blood pressure check.  Patient delivered 12/17/2017 vaginally.  She denies taking amlodipine.  Denies headache and blurred vision.  Pressure is 99/67 pulse 84.  Advised patient if she gets a headache that does not go away with medicine to go to Maternity admission hospital.

## 2018-01-14 ENCOUNTER — Ambulatory Visit: Payer: 59 | Admitting: Obstetrics and Gynecology

## 2018-01-30 ENCOUNTER — Encounter: Payer: Self-pay | Admitting: Obstetrics and Gynecology

## 2018-01-30 NOTE — Progress Notes (Unsigned)
Appointment reminder has been sent.

## 2018-02-04 ENCOUNTER — Ambulatory Visit (HOSPITAL_BASED_OUTPATIENT_CLINIC_OR_DEPARTMENT_OTHER): Admit: 2018-02-04 | Payer: 59 | Admitting: Obstetrics and Gynecology

## 2018-02-04 ENCOUNTER — Encounter (HOSPITAL_BASED_OUTPATIENT_CLINIC_OR_DEPARTMENT_OTHER): Payer: Self-pay

## 2018-02-04 SURGERY — LIGATION, FALLOPIAN TUBE, LAPAROSCOPIC
Anesthesia: Choice | Laterality: Bilateral

## 2018-02-08 ENCOUNTER — Emergency Department (HOSPITAL_COMMUNITY)
Admission: EM | Admit: 2018-02-08 | Discharge: 2018-02-08 | Disposition: A | Discharge status: Home / Self Care | Payer: 59 | Attending: Emergency Medicine | Admitting: Emergency Medicine

## 2018-02-08 ENCOUNTER — Other Ambulatory Visit: Payer: Self-pay

## 2018-02-08 ENCOUNTER — Encounter (HOSPITAL_COMMUNITY): Payer: Self-pay | Admitting: Obstetrics and Gynecology

## 2018-02-08 DIAGNOSIS — Y33XXXA Other specified events, undetermined intent, initial encounter: Secondary | ICD-10-CM | POA: Insufficient documentation

## 2018-02-08 DIAGNOSIS — Z91040 Latex allergy status: Secondary | ICD-10-CM | POA: Diagnosis not present

## 2018-02-08 DIAGNOSIS — Y998 Other external cause status: Secondary | ICD-10-CM | POA: Diagnosis not present

## 2018-02-08 DIAGNOSIS — Y939 Activity, unspecified: Secondary | ICD-10-CM | POA: Diagnosis not present

## 2018-02-08 DIAGNOSIS — Z87891 Personal history of nicotine dependence: Secondary | ICD-10-CM | POA: Insufficient documentation

## 2018-02-08 DIAGNOSIS — Z79899 Other long term (current) drug therapy: Secondary | ICD-10-CM | POA: Insufficient documentation

## 2018-02-08 DIAGNOSIS — Y929 Unspecified place or not applicable: Secondary | ICD-10-CM | POA: Insufficient documentation

## 2018-02-08 DIAGNOSIS — S90425A Blister (nonthermal), left lesser toe(s), initial encounter: Secondary | ICD-10-CM | POA: Insufficient documentation

## 2018-02-08 DIAGNOSIS — L089 Local infection of the skin and subcutaneous tissue, unspecified: Secondary | ICD-10-CM | POA: Diagnosis not present

## 2018-02-08 MED ORDER — SULFAMETHOXAZOLE-TRIMETHOPRIM 800-160 MG PO TABS: 1.0000 | ORAL_TABLET | Freq: Two times a day (BID) | ORAL | 0 refills | Status: AC

## 2018-02-08 NOTE — ED Provider Notes (Signed)
[facility] DEPT Provider Note   CSN: [id number] Arrival date & time: 02/08/18  1334     History   Chief Complaint Chief Complaint  Patient presents with  . Foot Pain    HPI Sonia Allen is a 32 y.o. female who presents to the ED for foot pain. The pain is located in the left foot. Patient reports an infected area to the little toe. Patient has been soaking it in salt water but the infection seem to keep coming back. Patient has NOT had antibiotics since the area started. Patient denies fever, chills or other problems. Patient does have a one month old infant but reports she is not breast feeding.  HPI  Past Medical History:  Diagnosis Date  . Anemia    first and sec pregnancies  . Arrhythmia   . Chlamydia   . Gonorrhea   . Headache(784.0)   . Hemorrhoids   . Hx of migraine headaches   . Leiomyoma of uterus in pregnancy     Patient Active Problem List   Diagnosis Date Noted  . Gestational hypertension 12/17/2017  . Encounter for induction of labor 12/16/2017  . Group B Streptococcus carrier, +RV culture, currently pregnant 11/19/2017  . Marijuana use 06/16/2017  . Supervision of other normal pregnancy, antepartum 05/19/2017  . Uterine fibroids affecting pregnancy 10/14/2012    Past Surgical History:  Procedure Laterality Date  . INDUCED ABORTION  March  2 ,2013  . WISDOM TOOTH EXTRACTION       OB History    Gravida  8   Para  5   Term  5   Preterm  0   AB  3   Living  5     SAB  1   TAB  2   Ectopic  0   Multiple  0   Live Births  5            Home Medications    Prior to Admission medications   Medication Sig Start Date End Date Taking? Authorizing Provider  cyclobenzaprine (FLEXERIL) 10 MG tablet Take 1 tablet (10 mg total) by mouth 3 (three) times daily as needed for muscle spasms. 09/29/17   Julianne Handler, CNM  fluticasone (FLONASE) 50 MCG/ACT nasal spray Place 1 spray into both nostrils daily.     [provider]  ibuprofen (ADVIL,MOTRIN) 600 MG tablet Take 1 tablet (600 mg total) by mouth every 6 (six) hours. 12/19/17   Bonnita Hollow, MD  Prenatal Vit-Fe Fumarate-FA (PRENATAL MULTIVITAMIN) TABS tablet Take 1 tablet by mouth daily at 12 noon. 12/19/17   Bonnita Hollow, MD  sulfamethoxazole-trimethoprim (BACTRIM DS,SEPTRA DS) 800-160 MG tablet Take 1 tablet by mouth 2 (two) times daily for 7 days. 02/08/18 02/15/18  Ashley Murrain, NP    Family History Family History  Problem Relation Age of Onset  . Other Neg Hx   . Alcohol abuse Neg Hx   . Arthritis Neg Hx   . Asthma Neg Hx   . Birth defects Neg Hx   . Cancer Neg Hx   . COPD Neg Hx   . Depression Neg Hx   . Diabetes Neg Hx   . Drug abuse Neg Hx   . Early death Neg Hx   . Heart disease Neg Hx   . Hearing loss Neg Hx   . Hyperlipidemia Neg Hx   . Hypertension Neg Hx   . Kidney disease Neg Hx   . Learning disabilities Neg  Hx   . Mental illness Neg Hx   . Mental retardation Neg Hx   . Miscarriages / Stillbirths Neg Hx   . Stroke Neg Hx   . Vision loss Neg Hx     Social History Social History   Tobacco Use  . Smoking status: Former Research scientist (life sciences)  . Smokeless tobacco: Never Used  Substance Use Topics  . Alcohol use: Yes    Comment: Pt reports socially  . Drug use: Not Currently    Frequency: 14.0 times per week    Types: Marijuana    Comment: Reports not currently smoking     Allergies   Latex   Review of Systems Review of Systems  Skin: Positive for wound.  All other systems reviewed and are negative.    Physical Exam Updated Vital Signs BP (!) 127/57   Pulse 79   Temp 98.2 F (36.8 C) (Oral)   Resp 16   LMP 02/25/2017   SpO2 100%   Breastfeeding? No   Physical Exam  Constitutional: She appears well-developed and well-nourished. No distress.  HENT:  Head: Normocephalic and atraumatic.  Eyes: Conjunctivae and EOM are normal.  Neck: Neck supple.  Cardiovascular: Normal rate.    Pulmonary/Chest: Effort normal.  Musculoskeletal: Normal range of motion.       Left foot: There is tenderness. There is normal range of motion and no deformity.       Feet:  There is a blister area to the left little toe that is open and draining. The drainage is yellow. There is no red streaking. There does not appear to be anything to open further at this time.   Neurological: She is alert.  Skin: Skin is warm and dry.  Psychiatric: She has a normal mood and affect. Her behavior is normal.  Nursing note and vitals reviewed.    ED Treatments / Results  Labs (all labs ordered are listed, but only abnormal results are displayed) Labs Reviewed - No data to display  Radiology No results found.  Procedures Procedures (including critical care time)  Medications Ordered in ED Medications - No data to display   Initial Impression / Assessment and Plan / ED Course  I have reviewed the triage vital signs and the nursing notes. 32 y.o. female here with pain and draining blister to the left little toe stable for d/c without fever and does not appear toxic. There are no red streaks. Will treat with oral antibiotics. Discussed with the patient importance of foot care and drying between toes. She will f/u with [facility]. She will return here as needed.  Final Clinical Impressions(s) / ED Diagnoses   Final diagnoses:  Infected blister of fifth toe of left foot, initial encounter    ED Discharge Orders         Ordered    sulfamethoxazole-trimethoprim (BACTRIM DS,SEPTRA DS) 800-160 MG tablet  2 times daily     02/08/18 [address] [place], [place] 02/08/18 1603    Quintella Reichert, MD 02/09/18 0800

## 2018-02-08 NOTE — Discharge Instructions (Addendum)
Call [facility] to schedule a follow up appointment. Return here as needed.

## 2018-02-08 NOTE — ED Triage Notes (Signed)
Pt comes in with c/o left foot pain. There is some swelling noted to the foot and the little toe looks to have possible puss on it. Pt reports she has been trying to keep it drained by doing salt water soaks, but the infection continues to come back. Pt has not had antibiotic therapy for the area.  Pt is alert and ambulatory and VSS.

## 2018-02-11 ENCOUNTER — Encounter (HOSPITAL_COMMUNITY): Payer: Self-pay | Admitting: Obstetrics and Gynecology

## 2018-02-11 ENCOUNTER — Other Ambulatory Visit: Payer: Self-pay

## 2018-02-11 ENCOUNTER — Emergency Department (HOSPITAL_COMMUNITY)
Admission: EM | Admit: 2018-02-11 | Discharge: 2018-02-12 | Disposition: A | Discharge status: Home / Self Care | Payer: 59 | Attending: Emergency Medicine | Admitting: Emergency Medicine

## 2018-02-11 DIAGNOSIS — R1030 Lower abdominal pain, unspecified: Secondary | ICD-10-CM | POA: Diagnosis present

## 2018-02-11 DIAGNOSIS — R110 Nausea: Secondary | ICD-10-CM | POA: Diagnosis not present

## 2018-02-11 DIAGNOSIS — B9689 Other specified bacterial agents as the cause of diseases classified elsewhere: Secondary | ICD-10-CM

## 2018-02-11 DIAGNOSIS — F17210 Nicotine dependence, cigarettes, uncomplicated: Secondary | ICD-10-CM | POA: Diagnosis not present

## 2018-02-11 DIAGNOSIS — N760 Acute vaginitis: Secondary | ICD-10-CM | POA: Diagnosis not present

## 2018-02-11 LAB — COMPREHENSIVE METABOLIC PANEL
ALK PHOS: 62 U/L (ref 38–126)
ALT: 21 U/L (ref 0–44)
ANION GAP: 9 (ref 5–15)
AST: 18 U/L (ref 15–41)
Albumin: 4.2 g/dL (ref 3.5–5.0)
BILIRUBIN TOTAL: 0.5 mg/dL (ref 0.3–1.2)
BUN: 19 mg/dL (ref 6–20)
CO2: 24 mmol/L (ref 22–32)
Calcium: 9.1 mg/dL (ref 8.9–10.3)
Chloride: 105 mmol/L (ref 98–111)
Creatinine, Ser: 0.78 mg/dL (ref 0.44–1.00)
GFR calc non Af Amer: >60 mL/min (ref >60)
Glucose, Bld: 102 mg/dL — ABNORMAL HIGH (ref 70–99)
Potassium: 4.8 mmol/L (ref 3.5–5.1)
SODIUM: 138 mmol/L (ref 135–145)
TOTAL PROTEIN: 7.4 g/dL (ref 6.5–8.1)

## 2018-02-11 LAB — CBC
HCT: 34 % — ABNORMAL LOW (ref 36.0–46.0)
HEMOGLOBIN: 11.1 g/dL — AB (ref 12.0–15.0)
MCH: 28.6 pg (ref 26.0–34.0)
MCHC: 32.6 g/dL (ref 30.0–36.0)
MCV: 87.6 fL (ref 78.0–100.0)
Platelets: 377 10*3/uL (ref 150–400)
RBC: 3.88 MIL/uL (ref 3.87–5.11)
RDW: 14.3 % (ref 11.5–15.5)
WBC: 7.2 10*3/uL (ref 4.0–10.5)

## 2018-02-11 LAB — I-STAT BETA HCG BLOOD, ED (MC, WL, AP ONLY)

## 2018-02-11 LAB — LIPASE, BLOOD: Lipase: 31 U/L (ref 11–51)

## 2018-02-11 NOTE — ED Triage Notes (Signed)
Pt reports she is having sharp abdominal pains and nausea. Pt reports the the pain started today.  Pt reports she is also itching and was recently prescribed an antibiotic for a toe infection

## 2018-02-12 LAB — WET PREP, GENITAL
Sperm: NONE SEEN
Trich, Wet Prep: NONE SEEN
Yeast Wet Prep HPF POC: NONE SEEN

## 2018-02-12 LAB — GC/CHLAMYDIA PROBE AMP (~~LOC~~) NOT AT ARMC
Chlamydia: NEGATIVE
Neisseria Gonorrhea: NEGATIVE

## 2018-02-12 MED ORDER — METRONIDAZOLE 500 MG PO TABS
2000.0000 mg | ORAL_TABLET | Freq: Once | ORAL | Status: AC
  Administered 2018-02-12: 2000 mg via ORAL
  Filled 2018-02-12: qty 4

## 2018-02-12 MED ORDER — IBUPROFEN 800 MG PO TABS
800.0000 mg | ORAL_TABLET | Freq: Once | ORAL | Status: AC
  Administered 2018-02-12: 800 mg via ORAL
  Filled 2018-02-12: qty 1

## 2018-02-12 MED ORDER — ONDANSETRON 4 MG PO TBDP
4.0000 mg | ORAL_TABLET | Freq: Once | ORAL | Status: AC
  Administered 2018-02-12: 4 mg via ORAL
  Filled 2018-02-12: qty 1

## 2018-02-12 MED ORDER — ONDANSETRON 4 MG PO TBDP: ORAL_TABLET | ORAL | 0 refills | Status: DC

## 2018-02-12 MED ORDER — ACETAMINOPHEN 500 MG PO TABS
1000.0000 mg | ORAL_TABLET | Freq: Once | ORAL | Status: AC
  Administered 2018-02-12: 1000 mg via ORAL
  Filled 2018-02-12: qty 2

## 2018-02-12 MED ORDER — OXYCODONE HCL 5 MG PO TABS
5.0000 mg | ORAL_TABLET | Freq: Once | ORAL | Status: AC
  Administered 2018-02-12: 5 mg via ORAL
  Filled 2018-02-12: qty 1

## 2018-02-12 NOTE — Discharge Instructions (Signed)
Take 4 over the counter ibuprofen tablets 3 times a day or 2 over-the-counter naproxen tablets twice a day for pain. Also take tylenol 1000mg (2 extra strength) four times a day.   Follow up with your OBGYN or your PCP.  Return for sudden worsening pain, fever, inability to eat or drink.  Congratulations on your new baby!

## 2018-02-12 NOTE — ED Notes (Signed)
Pelvic cart at bedside. 

## 2018-02-12 NOTE — ED Provider Notes (Signed)
[facility] DEPT Provider Note   CSN: [id number] Arrival date & time: 02/11/18  2205     History   Chief Complaint Chief Complaint  Patient presents with  . Abdominal Pain  . Nausea    HPI Sonia Allen is a 32 y.o. female.  32 yo F with a chief complaint of lower abdominal pain.  Going on since this morning.  Described as sharp and stabbing.  Denies fevers or chills has had some nausea but denies vomiting.  Denies dysuria increased frequency or hesitancy.  Denies flank pain.  Had a vaginal delivery about 2 months ago.  Denies significant vaginal bleeding or discharge.  The history is provided by the patient.  Abdominal Pain   This is a new problem. The current episode started 12 to 24 hours ago. The problem occurs constantly. The problem has not changed since onset.The pain is located in the RLQ, LLQ and suprapubic region. The quality of the pain is sharp and shooting. The pain is at a severity of 9/10. The pain is moderate. Pertinent negatives include fever, nausea, vomiting, dysuria, headaches, arthralgias and myalgias. Nothing aggravates the symptoms. Nothing relieves the symptoms.    Past Medical History:  Diagnosis Date  . Anemia    first and sec pregnancies  . Arrhythmia   . Chlamydia   . Gonorrhea   . Headache(784.0)   . Hemorrhoids   . Hx of migraine headaches   . Leiomyoma of uterus in pregnancy     Patient Active Problem List   Diagnosis Date Noted  . Gestational hypertension 12/17/2017  . Encounter for induction of labor 12/16/2017  . Group B Streptococcus carrier, +RV culture, currently pregnant 11/19/2017  . Marijuana use 06/16/2017  . Supervision of other normal pregnancy, antepartum 05/19/2017  . Uterine fibroids affecting pregnancy 10/14/2012    Past Surgical History:  Procedure Laterality Date  . INDUCED ABORTION  March  2 ,2013  . WISDOM TOOTH EXTRACTION       OB History    Gravida  8   Para  5   Term  5   Preterm  0   AB  3   Living  5     SAB  1   TAB  2   Ectopic  0   Multiple  0   Live Births  5            Home Medications    Prior to Admission medications   Medication Sig Start Date End Date Taking? Authorizing Provider  cyclobenzaprine (FLEXERIL) 10 MG tablet Take 1 tablet (10 mg total) by mouth 3 (three) times daily as needed for muscle spasms. 09/29/17   Julianne Handler, CNM  fluticasone (FLONASE) 50 MCG/ACT nasal spray Place 1 spray into both nostrils daily.    [provider]  ibuprofen (ADVIL,MOTRIN) 600 MG tablet Take 1 tablet (600 mg total) by mouth every 6 (six) hours. 12/19/17   Bonnita Hollow, MD  ondansetron (ZOFRAN ODT) 4 MG disintegrating tablet 4mg  ODT q4 hours prn nausea/vomit 02/12/18   Deno Etienne, DO  Prenatal Vit-Fe Fumarate-FA (PRENATAL MULTIVITAMIN) TABS tablet Take 1 tablet by mouth daily at 12 noon. 12/19/17   Bonnita Hollow, MD  sulfamethoxazole-trimethoprim (BACTRIM DS,SEPTRA DS) 800-160 MG tablet Take 1 tablet by mouth 2 (two) times daily for 7 days. 02/08/18 02/15/18  Ashley Murrain, NP    Family History Family History  Problem Relation Age of Onset  . Other Neg Hx   .  Alcohol abuse Neg Hx   . Arthritis Neg Hx   . Asthma Neg Hx   . Birth defects Neg Hx   . Cancer Neg Hx   . COPD Neg Hx   . Depression Neg Hx   . Diabetes Neg Hx   . Drug abuse Neg Hx   . Early death Neg Hx   . Heart disease Neg Hx   . Hearing loss Neg Hx   . Hyperlipidemia Neg Hx   . Hypertension Neg Hx   . Kidney disease Neg Hx   . Learning disabilities Neg Hx   . Mental illness Neg Hx   . Mental retardation Neg Hx   . Miscarriages / Stillbirths Neg Hx   . Stroke Neg Hx   . Vision loss Neg Hx     Social History Social History   Tobacco Use  . Smoking status: Former Research scientist (life sciences)  . Smokeless tobacco: Never Used  Substance Use Topics  . Alcohol use: Yes    Comment: Pt reports socially  . Drug use: Not Currently    Frequency: 14.0 times per week     Types: Marijuana    Comment: Reports not currently smoking     Allergies   Latex   Review of Systems Review of Systems  Constitutional: Negative for chills and fever.  HENT: Negative for congestion and rhinorrhea.   Eyes: Negative for redness and visual disturbance.  Respiratory: Negative for shortness of breath and wheezing.   Cardiovascular: Negative for chest pain and palpitations.  Gastrointestinal: Positive for abdominal pain. Negative for nausea and vomiting.  Genitourinary: Negative for dysuria and urgency.  Musculoskeletal: Negative for arthralgias and myalgias.  Skin: Negative for pallor and wound.  Neurological: Negative for dizziness and headaches.     Physical Exam Updated Vital Signs BP 106/60 (BP Location: Left Arm)   Pulse 72   Temp 98.8 F (37.1 C) (Oral)   Resp 18   Ht 5\' 4"  (1.626 m)   Wt 81.2 kg   LMP 03/10/2017   SpO2 100%   BMI 30.73 kg/m   Physical Exam  Constitutional: She is oriented to person, place, and time. She appears well-developed and well-nourished. No distress.  HENT:  Head: Normocephalic and atraumatic.  Eyes: Pupils are equal, round, and reactive to light. EOM are normal.  Neck: Normal range of motion. Neck supple.  Cardiovascular: Normal rate and regular rhythm. Exam reveals no gallop and no friction rub.  No murmur heard. Pulmonary/Chest: Effort normal. She has no wheezes. She has no rales.  Abdominal: Soft. She exhibits no distension. There is no tenderness.  Pain worse very low in the abdomen  Genitourinary:  Genitourinary Comments: Copious vaginal discharge, no cervical motion tenderness.  No adnexal tenderness.  Mild cervical motion tenderness.  She feels that this is the area of which she is having pain.  Musculoskeletal: She exhibits no edema or tenderness.  Neurological: She is alert and oriented to person, place, and time.  Skin: Skin is warm and dry. She is not diaphoretic.  Psychiatric: She has a normal mood and  affect. Her behavior is normal.  Nursing note and vitals reviewed.    ED Treatments / Results  Labs (all labs ordered are listed, but only abnormal results are displayed) Labs Reviewed  WET PREP, GENITAL - Abnormal; Notable for the following components:      Result Value   Clue Cells Wet Prep HPF POC PRESENT (*)    WBC, Wet Prep HPF POC FEW (*)  All other components within normal limits  COMPREHENSIVE METABOLIC PANEL - Abnormal; Notable for the following components:   Glucose, Bld 102 (*)    All other components within normal limits  CBC - Abnormal; Notable for the following components:   Hemoglobin 11.1 (*)    HCT 34.0 (*)    All other components within normal limits  LIPASE, BLOOD  I-STAT BETA HCG BLOOD, ED ([facility], [facility], [facility] ONLY)  GC/CHLAMYDIA PROBE AMP ([place]) NOT AT [facility]    EKG None  Radiology No results found.  Procedures Procedures (including critical care time)  Medications Ordered in ED Medications  metroNIDAZOLE (FLAGYL) tablet 2,000 mg (has no administration in time range)  acetaminophen (TYLENOL) tablet 1,000 mg (1,000 mg Oral Given 02/12/18 0015)  oxyCODONE (Oxy IR/ROXICODONE) immediate release tablet 5 mg (5 mg Oral Given 02/12/18 0015)  ibuprofen (ADVIL,MOTRIN) tablet 800 mg (800 mg Oral Given 02/12/18 0015)  ondansetron (ZOFRAN-ODT) disintegrating tablet 4 mg (4 mg Oral Given 02/12/18 0033)     Initial Impression / Assessment and Plan / ED Course  I have reviewed the triage vital signs and the nursing notes.  Pertinent labs & imaging results that were available during my care of the patient were reviewed by me and considered in my medical decision making (see chart for details).     32 yo F with a chief complaint of lower abdominal pain.  Going on since this morning.  Described as sharp and shooting.  Patient is about 2 months post vaginal delivery.  Denies any significant vaginal bleeding or discharge.  Denies fevers.  Pelvic with copious  discharge.  Found to have BV.  We will treat.  GYN and PCP follow-up.  1:24 AM:  I have discussed the diagnosis/risks/treatment options with the patient and family and believe the pt to be eligible for discharge home to follow-up with PCP, OBGYN. We also discussed returning to the ED immediately if new or worsening sx occur. We discussed the sx which are most concerning (e.g., sudden worsening pain, fever, inability to tolerate by mouth) that necessitate immediate return. Medications administered to the patient during their visit and any new prescriptions provided to the patient are listed below.  Medications given during this visit Medications  metroNIDAZOLE (FLAGYL) tablet 2,000 mg (has no administration in time range)  acetaminophen (TYLENOL) tablet 1,000 mg (1,000 mg Oral Given 02/12/18 0015)  oxyCODONE (Oxy IR/ROXICODONE) immediate release tablet 5 mg (5 mg Oral Given 02/12/18 0015)  ibuprofen (ADVIL,MOTRIN) tablet 800 mg (800 mg Oral Given 02/12/18 0015)  ondansetron (ZOFRAN-ODT) disintegrating tablet 4 mg (4 mg Oral Given 02/12/18 0033)     The patient appears reasonably screen and/or stabilized for discharge and I doubt any other medical condition or other [facility] requiring further screening, evaluation, or treatment in the ED at this time prior to discharge.    Final Clinical Impressions(s) / ED Diagnoses   Final diagnoses:  Bacterial vaginosis    ED Discharge Orders         Ordered    ondansetron (ZOFRAN ODT) 4 MG disintegrating tablet     02/12/18 0123           Deno Etienne, DO 02/12/18 0124

## 2018-02-25 ENCOUNTER — Ambulatory Visit: Payer: 59 | Admitting: Obstetrics and Gynecology

## 2018-02-26 ENCOUNTER — Encounter: Payer: Self-pay | Admitting: Student

## 2018-02-26 ENCOUNTER — Ambulatory Visit (INDEPENDENT_AMBULATORY_CARE_PROVIDER_SITE_OTHER): Payer: 59 | Admitting: Student

## 2018-02-26 VITALS — BP 109/67 | HR 73 | Wt 178.0 lb

## 2018-02-26 DIAGNOSIS — Z3009 Encounter for other general counseling and advice on contraception: Secondary | ICD-10-CM

## 2018-02-26 LAB — POCT PREGNANCY, URINE: Preg Test, Ur: NEGATIVE

## 2018-02-26 NOTE — Patient Instructions (Addendum)
No Primary Care Doctor:  To locate a primary care doctor that accepts your insurance or provides certain services:           [place]: [phone]           Physician Referral Service: [phone] ask for "My [place]"      Contraception Choices Contraception, also called birth control, refers to methods or devices that prevent pregnancy. Hormonal methods Contraceptive implant A contraceptive implant is a thin, plastic tube that contains a hormone. It is inserted into the upper part of the arm. It can remain in place for up to 3 years. Progestin-only injections Progestin-only injections are injections of progestin, a synthetic form of the hormone progesterone. They are given every 3 months by a health care provider. Birth control pills Birth control pills are pills that contain hormones that prevent pregnancy. They must be taken once a day, preferably at the same time each day. Birth control patch The birth control patch contains hormones that prevent pregnancy. It is placed on the skin and must be changed once a week for three weeks and removed on the fourth week. A prescription is needed to use this method of contraception. Vaginal ring A vaginal ring contains hormones that prevent pregnancy. It is placed in the vagina for three weeks and removed on the fourth week. After that, the process is repeated with a new ring. A prescription is needed to use this method of contraception. Emergency contraceptive Emergency contraceptives prevent pregnancy after unprotected sex. They come in pill form and can be taken up to 5 days after sex. They work best the sooner they are taken after having sex. Most emergency contraceptives are available without a prescription. This method should not be used as your only form of birth control. Barrier methods Female condom A female condom is a thin sheath that is worn over the penis during sex. Condoms keep sperm from going inside a  woman's body. They can be used with a spermicide to increase their effectiveness. They should be disposed after a single use. Female condom A female condom is a soft, loose-fitting sheath that is put into the vagina before sex. The condom keeps sperm from going inside a woman's body. They should be disposed after a single use. Diaphragm A diaphragm is a soft, dome-shaped barrier. It is inserted into the vagina before sex, along with a spermicide. The diaphragm blocks sperm from entering the uterus, and the spermicide kills sperm. A diaphragm should be left in the vagina for 6-8 hours after sex and removed within 24 hours. A diaphragm is prescribed and fitted by a health care provider. A diaphragm should be replaced every 1-2 years, after giving birth, after gaining more than 15 lb (6.8 kg), and after pelvic surgery. Cervical cap A cervical cap is a round, soft latex or plastic cup that fits over the cervix. It is inserted into the vagina before sex, along with spermicide. It blocks sperm from entering the uterus. The cap should be left in place for 6-8 hours after sex and removed within 48 hours. A cervical cap must be prescribed and fitted by a health care provider. It should be replaced every 2 years. Sponge A sponge is a soft, circular piece of polyurethane foam with spermicide on it. The sponge helps block sperm from entering the uterus, and the spermicide kills sperm. To use it, you make it wet and then insert it  into the vagina. It should be inserted before sex, left in for at least 6 hours after sex, and removed and thrown away within 30 hours. Spermicides Spermicides are chemicals that kill or block sperm from entering the cervix and uterus. They can come as a cream, jelly, suppository, foam, or tablet. A spermicide should be inserted into the vagina with an applicator at least 77-41 minutes before sex to allow time for it to work. The process must be repeated every time you have sex. Spermicides  do not require a prescription. Intrauterine contraception Intrauterine device (IUD) An IUD is a T-shaped device that is put in a woman's uterus. There are two types:  Hormone IUD.This type contains progestin, a synthetic form of the hormone progesterone. This type can stay in place for 3-5 years.  Copper IUD.This type is wrapped in copper wire. It can stay in place for 10 years.  Permanent methods of contraception Female tubal ligation In this method, a woman's fallopian tubes are sealed, tied, or blocked during surgery to prevent eggs from traveling to the uterus. Hysteroscopic sterilization In this method, a small, flexible insert is placed into each fallopian tube. The inserts cause scar tissue to form in the fallopian tubes and block them, so sperm cannot reach an egg. The procedure takes about 3 months to be effective. Another form of birth control must be used during those 3 months. Female sterilization This is a procedure to tie off the tubes that carry sperm (vasectomy). After the procedure, the man can still ejaculate fluid (semen). Natural planning methods Natural family planning In this method, a couple does not have sex on days when the woman could become pregnant. Calendar method This means keeping track of the length of each menstrual cycle, identifying the days when pregnancy can happen, and not having sex on those days. Ovulation method In this method, a couple avoids sex during ovulation. Symptothermal method This method involves not having sex during ovulation. The woman typically checks for ovulation by watching changes in her temperature and in the consistency of cervical mucus. Post-ovulation method In this method, a couple waits to have sex until after ovulation. Summary  Contraception, also called birth control, means methods or devices that prevent pregnancy.  Hormonal methods of contraception include implants, injections, pills, patches, vaginal rings, and  emergency contraceptives.  Barrier methods of contraception can include female condoms, female condoms, diaphragms, cervical caps, sponges, and spermicides.  There are two types of IUDs (intrauterine devices). An IUD can be put in a woman's uterus to prevent pregnancy for 3-5 years.  Permanent sterilization can be done through a procedure for males, females, or both.  Natural family planning methods involve not having sex on days when the woman could become pregnant. This information is not intended to replace advice given to you by your health care provider. Make sure you discuss any questions you have with your health care provider. Document Released: 06/17/2005 Document Revised: 07/20/2016 Document Reviewed: 07/20/2016 Elsevier Interactive Patient Education  2018 [employer].

## 2018-02-26 NOTE — Progress Notes (Signed)
History:  [name] is a 32 y.o. [id number] who presents to clinic today for contraception discussion. She is 8 wks s/p SVD. Was considering BTL but cancelled surgery as she still wants children in the future.  Previously used depo & OCPs and does not want to use them again d/t side effects.  Has not been sexually active since her delivery.     Patient Active Problem List   Diagnosis Date Noted  . Gestational hypertension 12/17/2017  . Encounter for induction of labor 12/16/2017  . Group B Streptococcus carrier, +RV culture, currently pregnant 11/19/2017  . Marijuana use 06/16/2017  . Supervision of other normal pregnancy, antepartum 05/19/2017  . Uterine fibroids affecting pregnancy 10/14/2012    Allergies  Allergen Reactions  . Latex Rash    Burning sensation    Current Outpatient Medications on File Prior to Visit  Medication Sig Dispense Refill  . ibuprofen (ADVIL,MOTRIN) 600 MG tablet Take 1 tablet (600 mg total) by mouth every 6 (six) hours. 30 tablet 0  . ondansetron (ZOFRAN ODT) 4 MG disintegrating tablet 4mg  ODT q4 hours prn nausea/vomit 20 tablet 0  . cyclobenzaprine (FLEXERIL) 10 MG tablet Take 1 tablet (10 mg total) by mouth 3 (three) times daily as needed for muscle spasms. (Patient not taking: Reported on 02/26/2018) 30 tablet 0  . fluticasone (FLONASE) 50 MCG/ACT nasal spray Place 1 spray into both nostrils daily.    . Prenatal Vit-Fe Fumarate-FA (PRENATAL MULTIVITAMIN) TABS tablet Take 1 tablet by mouth daily at 12 noon. (Patient not taking: Reported on 02/26/2018)     No current facility-administered medications on file prior to visit.      The following portions of the patient's history were reviewed and updated as appropriate: allergies, current medications, family history, past medical history, social history, past surgical history and problem list.  Review of Systems:  Other than those mentioned in HPI all ROS negative   Objective:  Physical Exam BP  109/67   Pulse 73   Wt 178 lb (80.7 kg)   LMP 03/08/2017 (Exact Date)   BMI 30.55 kg/m  CONSTITUTIONAL: Well-developed, well-nourished female in no acute distress.  EYES: EOM intact, conjunctivae normal, no scleral icterus HEAD: Normocephalic, atraumatic  RESPIRATORY: Effort and breath sounds normal, no problems with respiration noted. PSYCHIATRIC: Normal mood and affect. Normal behavior. Normal judgment and thought content.    Reviewed all forms of birth control options available including abstinence; over the counter/barrier methods; hormonal contraceptive medication including pill, patch, ring, injection,contraceptive implant; hormonal and nonhormonal IUDs; permanent sterilization options including vasectomy and the various tubal sterilization modalities. Risks and benefits reviewed.  Questions were answered.  Information was given to patient to review.    Assessment & Plan:  1. General counseling and advice on female contraception -patient given information. Will schedule f/u appt for initiation of birth control of her choice. In mean time will use condoms.     Jorje Guild, NP 02/26/2018 4:49 PM

## 2018-06-28 ENCOUNTER — Encounter (HOSPITAL_COMMUNITY): Payer: Self-pay | Admitting: Emergency Medicine

## 2018-06-28 ENCOUNTER — Emergency Department (HOSPITAL_COMMUNITY)
Admission: EM | Admit: 2018-06-28 | Discharge: 2018-06-28 | Disposition: A | Discharge status: Home / Self Care | Payer: 59 | Attending: Emergency Medicine | Admitting: Emergency Medicine

## 2018-06-28 DIAGNOSIS — R102 Pelvic and perineal pain: Secondary | ICD-10-CM | POA: Insufficient documentation

## 2018-06-28 DIAGNOSIS — N946 Dysmenorrhea, unspecified: Secondary | ICD-10-CM | POA: Diagnosis not present

## 2018-06-28 DIAGNOSIS — N760 Acute vaginitis: Secondary | ICD-10-CM | POA: Insufficient documentation

## 2018-06-28 DIAGNOSIS — Z79899 Other long term (current) drug therapy: Secondary | ICD-10-CM | POA: Diagnosis not present

## 2018-06-28 DIAGNOSIS — B9689 Other specified bacterial agents as the cause of diseases classified elsewhere: Secondary | ICD-10-CM | POA: Insufficient documentation

## 2018-06-28 DIAGNOSIS — R42 Dizziness and giddiness: Secondary | ICD-10-CM | POA: Insufficient documentation

## 2018-06-28 DIAGNOSIS — Z87891 Personal history of nicotine dependence: Secondary | ICD-10-CM | POA: Insufficient documentation

## 2018-06-28 LAB — CBC WITH DIFFERENTIAL/PLATELET
ABS IMMATURE GRANULOCYTES: 0.01 10*3/uL (ref 0.00–0.07)
BASOS ABS: 0 10*3/uL (ref 0.0–0.1)
BASOS PCT: 0 %
Eosinophils Absolute: 0.1 10*3/uL (ref 0.0–0.5)
Eosinophils Relative: 1 %
HCT: 40.2 % (ref 36.0–46.0)
Hemoglobin: 12.8 g/dL (ref 12.0–15.0)
Immature Granulocytes: 0 %
LYMPHS PCT: 23 %
Lymphs Abs: 1.6 10*3/uL (ref 0.7–4.0)
MCH: 28.1 pg (ref 26.0–34.0)
MCHC: 31.8 g/dL (ref 30.0–36.0)
MCV: 88.2 fL (ref 80.0–100.0)
MONO ABS: 0.4 10*3/uL (ref 0.1–1.0)
Monocytes Relative: 6 %
NEUTROS ABS: 4.9 10*3/uL (ref 1.7–7.7)
NEUTROS PCT: 70 %
NRBC: 0 % (ref 0.0–0.2)
PLATELETS: 361 10*3/uL (ref 150–400)
RBC: 4.56 MIL/uL (ref 3.87–5.11)
RDW: 13.4 % (ref 11.5–15.5)
WBC: 7 10*3/uL (ref 4.0–10.5)

## 2018-06-28 LAB — COMPREHENSIVE METABOLIC PANEL
ALBUMIN: 4.7 g/dL (ref 3.5–5.0)
ALT: 15 U/L (ref 0–44)
ANION GAP: 10 (ref 5–15)
AST: 22 U/L (ref 15–41)
Alkaline Phosphatase: 57 U/L (ref 38–126)
BUN: 11 mg/dL (ref 6–20)
CHLORIDE: 105 mmol/L (ref 98–111)
CO2: 23 mmol/L (ref 22–32)
Calcium: 9.3 mg/dL (ref 8.9–10.3)
Creatinine, Ser: 0.7 mg/dL (ref 0.44–1.00)
GFR calc non Af Amer: >60 mL/min (ref >60)
GLUCOSE: 102 mg/dL — AB (ref 70–99)
Potassium: 3.9 mmol/L (ref 3.5–5.1)
SODIUM: 138 mmol/L (ref 135–145)
Total Bilirubin: 0.7 mg/dL (ref 0.3–1.2)
Total Protein: 8.2 g/dL — ABNORMAL HIGH (ref 6.5–8.1)

## 2018-06-28 LAB — URINALYSIS, ROUTINE W REFLEX MICROSCOPIC
Glucose, UA: NEGATIVE mg/dL
Ketones, ur: 15 mg/dL — AB
LEUKOCYTES UA: NEGATIVE
Nitrite: NEGATIVE
PROTEIN: 100 mg/dL — AB
pH: 5.5 (ref 5.0–8.0)

## 2018-06-28 LAB — I-STAT BETA HCG BLOOD, ED (MC, WL, AP ONLY): I-stat hCG, quantitative: <5 m[IU]/mL (ref <5)

## 2018-06-28 LAB — URINALYSIS, MICROSCOPIC (REFLEX): RBC / HPF: >50 RBC/hpf (ref 0–5)

## 2018-06-28 LAB — WET PREP, GENITAL
Sperm: NONE SEEN
TRICH WET PREP: NONE SEEN
YEAST WET PREP: NONE SEEN

## 2018-06-28 MED ORDER — ONDANSETRON 4 MG PO TBDP: 4.0000 mg | ORAL_TABLET | Freq: Three times a day (TID) | ORAL | 0 refills | Status: DC | PRN

## 2018-06-28 MED ORDER — METRONIDAZOLE 500 MG PO TABS: 500.0000 mg | ORAL_TABLET | Freq: Two times a day (BID) | ORAL | 0 refills | Status: AC

## 2018-06-28 MED ORDER — SODIUM CHLORIDE 0.9 % IV BOLUS
1000.0000 mL | Freq: Once | INTRAVENOUS | Status: AC
  Administered 2018-06-28: 1000 mL via INTRAVENOUS

## 2018-06-28 NOTE — ED Provider Notes (Signed)
[facility] DEPT Provider Note   CSN: [id number] Arrival date & time: 06/28/18  0915     History   Chief Complaint Chief Complaint  Patient presents with  . Vaginal Bleeding  . Nausea  . Abdominal Pain    HPI Sonia Allen is a 32 y.o. female.  HPI   Believes began menses yesterday, was red and then brown, but then slowed down last night, then today began bleeding, changing pad every 2-3 hours Lightheadness started yesterday, nausea, no vomiting No fevers, diarrhea No syncope Sharp cramping lower abdominal pain on both sides, constant No dysuria No vaginal discharge Menses have been irregular Pain 5/10  Past Medical History:  Diagnosis Date  . Anemia    first and sec pregnancies  . Arrhythmia   . Chlamydia   . Gonorrhea   . Headache(784.0)   . Hemorrhoids   . Hx of migraine headaches   . Leiomyoma of uterus in pregnancy     Patient Active Problem List   Diagnosis Date Noted  . Marijuana use 06/16/2017    Past Surgical History:  Procedure Laterality Date  . INDUCED ABORTION  March  2 ,2013  . WISDOM TOOTH EXTRACTION       OB History    Gravida  8   Para  5   Term  5   Preterm  0   AB  3   Living  5     SAB  1   TAB  2   Ectopic  0   Multiple  0   Live Births  5            Home Medications    Prior to Admission medications   Medication Sig Start Date End Date Taking? Authorizing Provider  cyclobenzaprine (FLEXERIL) 10 MG tablet Take 1 tablet (10 mg total) by mouth 3 (three) times daily as needed for muscle spasms. Patient not taking: Reported on 02/26/2018 09/29/17   Julianne Handler, CNM  fluticasone [facility]) 50 MCG/ACT nasal spray Place 1 spray into both nostrils daily.    [provider]  ibuprofen (ADVIL,MOTRIN) 600 MG tablet Take 1 tablet (600 mg total) by mouth every 6 (six) hours. 12/19/17   Bonnita Hollow, MD  metroNIDAZOLE (FLAGYL) 500 MG tablet Take 1 tablet (500 mg total) by  mouth 2 (two) times daily for 7 days. 06/28/18 07/05/18  Gareth Morgan, MD  ondansetron (ZOFRAN ODT) 4 MG disintegrating tablet Take 1 tablet (4 mg total) by mouth every 8 (eight) hours as needed for nausea or vomiting. 06/28/18   Gareth Morgan, MD  Prenatal Vit-Fe Fumarate-FA (PRENATAL MULTIVITAMIN) TABS tablet Take 1 tablet by mouth daily at 12 noon. Patient not taking: Reported on 02/26/2018 12/19/17   Bonnita Hollow, MD    Family History Family History  Problem Relation Age of Onset  . Other Neg Hx   . Alcohol abuse Neg Hx   . Arthritis Neg Hx   . Asthma Neg Hx   . Birth defects Neg Hx   . Cancer Neg Hx   . COPD Neg Hx   . Depression Neg Hx   . Diabetes Neg Hx   . Drug abuse Neg Hx   . Early death Neg Hx   . Heart disease Neg Hx   . Hearing loss Neg Hx   . Hyperlipidemia Neg Hx   . Hypertension Neg Hx   . Kidney disease Neg Hx   . Learning disabilities Neg Hx   .  Mental illness Neg Hx   . Mental retardation Neg Hx   . Miscarriages / Stillbirths Neg Hx   . Stroke Neg Hx   . Vision loss Neg Hx     Social History Social History   Tobacco Use  . Smoking status: Former Research scientist (life sciences)  . Smokeless tobacco: Never Used  Substance Use Topics  . Alcohol use: Yes    Comment: Pt reports socially  . Drug use: Not Currently    Frequency: 14.0 times per week    Types: Marijuana    Comment: Reports not currently smoking     Allergies   Latex   Review of Systems Review of Systems  Constitutional: Negative for fever.  HENT: Negative for sore throat.   Eyes: Negative for visual disturbance.  Respiratory: Negative for cough and shortness of breath.   Cardiovascular: Negative for chest pain.  Gastrointestinal: Positive for abdominal pain and nausea. Negative for constipation, diarrhea and vomiting.  Genitourinary: Positive for vaginal bleeding. Negative for difficulty urinating and dysuria.  Musculoskeletal: Negative for back pain and neck pain.  Skin: Negative for rash.    Neurological: Positive for light-headedness. Negative for syncope.     Physical Exam Updated Vital Signs BP 123/90   Pulse 80   Temp 98.9 F (37.2 C) (Oral)   Resp 14   LMP 06/27/2018   SpO2 100%   Physical Exam Vitals signs and nursing note reviewed.  Constitutional:      General: She is not in acute distress.    Appearance: She is well-developed. She is not diaphoretic.  HENT:     Head: Normocephalic and atraumatic.  Eyes:     Conjunctiva/sclera: Conjunctivae normal.  Neck:     Musculoskeletal: Normal range of motion.  Cardiovascular:     Rate and Rhythm: Normal rate and regular rhythm.     Heart sounds: Normal heart sounds. No murmur. No friction rub. No gallop.   Pulmonary:     Effort: Pulmonary effort is normal. No respiratory distress.     Breath sounds: Normal breath sounds. No wheezing or rales.  Abdominal:     General: There is no distension.     Palpations: Abdomen is soft.     Tenderness: There is no abdominal tenderness. There is no guarding.  Genitourinary:    Vagina: No vaginal discharge.     Cervix: Normal.     Uterus: Tender (mild).      Adnexa:        Right: No tenderness.         Left: No tenderness.    Musculoskeletal:        General: No tenderness.  Skin:    General: Skin is warm and dry.     Findings: No erythema or rash.  Neurological:     Mental Status: She is alert and oriented to person, place, and time.      ED Treatments / Results  Labs (all labs ordered are listed, but only abnormal results are displayed) Labs Reviewed  WET PREP, GENITAL - Abnormal; Notable for the following components:      Result Value   Clue Cells Wet Prep HPF POC PRESENT (*)    WBC, Wet Prep HPF POC MODERATE (*)    All other components within normal limits  COMPREHENSIVE METABOLIC PANEL - Abnormal; Notable for the following components:   Glucose, Bld 102 (*)    Total Protein 8.2 (*)    All other components within normal limits  URINALYSIS, ROUTINE W  REFLEX MICROSCOPIC - Abnormal; Notable for the following components:   Color, Urine BROWN (*)    APPearance CLOUDY (*)    Specific Gravity, Urine >1.030 (*)    Hgb urine dipstick LARGE (*)    Bilirubin Urine MODERATE (*)    Ketones, ur 15 (*)    Protein, ur 100 (*)    All other components within normal limits  URINALYSIS, MICROSCOPIC (REFLEX) - Abnormal; Notable for the following components:   Bacteria, UA FEW (*)    All other components within normal limits  URINE CULTURE  CBC WITH DIFFERENTIAL/PLATELET  I-STAT BETA HCG BLOOD, ED ([facility], [facility], [facility] ONLY)  GC/CHLAMYDIA PROBE AMP ([place]) NOT AT [facility]    EKG EKG Interpretation  Date/Time:  Sunday June 28 2018 11:53:44 EST Ventricular Rate:  80 PR Interval:    QRS Duration: 84 QT Interval:  376 QTC Calculation: 434 R Axis:   28 Text Interpretation:  Sinus rhythm Borderline T abnormalities, inferior leads No significant change since last tracing Confirmed by Luvada Salamone ([id number]) on 06/28/2018 1:29:58 PM   Radiology No results found.  Procedures Procedures (including critical care time)  Medications Ordered in ED Medications  sodium chloride 0.9 % bolus 1,000 mL (0 mLs Intravenous Stopped 06/28/18 1306)     Initial Impression / Assessment and Plan / ED Course  I have reviewed the triage vital signs and the nursing notes.  Pertinent labs & imaging results that were available during my care of the patient were reviewed by me and considered in my medical decision making (see chart for details).     32yo female presents with concern for cramping abdominal pain, vaginal bleeding.  Pain 5/10 and bilateral, doubt ovarian torsion. No discharge, doubt PID.  No fever, no vomiting, no RLQ tenderness, doubt appendicitis. Doubt cholecystitis, diverticulitis.  Pregnancy test negative. Patient with lightheadedness as well. ECG without acute findings. Hgb stable, electrolytes WNL.  Suspect symptoms viral or secondary to  dysmenorrhea. Given iv fluids. BV present, given flagyl.  Recommend hydration, zofran for nausea. Patient discharged in stable condition with understanding of reasons to return.   Final Clinical Impressions(s) / ED Diagnoses   Final diagnoses:  Dysmenorrhea  Pelvic pain  Lightheadedness  Bacterial vaginosis    ED Discharge Orders         Ordered    metroNIDAZOLE (FLAGYL) 500 MG tablet  2 times daily     06/28/18 1340    ondansetron (ZOFRAN ODT) 4 MG disintegrating tablet  Every 8 hours PRN     12 /29/19 [place], MD 06/28/18 2151

## 2018-06-28 NOTE — ED Triage Notes (Signed)
Pt reports started period yesterday and then turned darker brownish blood then stopped. Reports this morning she had lots of blood with sharp abd pains and nausea and dizzy.

## 2018-06-28 NOTE — ED Notes (Signed)
Pelvic exam is set up.

## 2018-06-29 LAB — URINE CULTURE: Culture: >=100000 — AB

## 2018-06-29 LAB — GC/CHLAMYDIA PROBE AMP (~~LOC~~) NOT AT ARMC
CHLAMYDIA, DNA PROBE: NEGATIVE
NEISSERIA GONORRHEA: NEGATIVE

## 2018-08-07 ENCOUNTER — Encounter (HOSPITAL_COMMUNITY): Payer: Self-pay

## 2018-08-07 ENCOUNTER — Emergency Department (HOSPITAL_COMMUNITY)
Admission: EM | Admit: 2018-08-07 | Discharge: 2018-08-07 | Disposition: A | Discharge status: Home / Self Care | Payer: Self-pay | Attending: Emergency Medicine | Admitting: Emergency Medicine

## 2018-08-07 ENCOUNTER — Emergency Department (HOSPITAL_COMMUNITY): Payer: Self-pay

## 2018-08-07 DIAGNOSIS — Z87891 Personal history of nicotine dependence: Secondary | ICD-10-CM | POA: Insufficient documentation

## 2018-08-07 DIAGNOSIS — J40 Bronchitis, not specified as acute or chronic: Secondary | ICD-10-CM | POA: Insufficient documentation

## 2018-08-07 LAB — INFLUENZA PANEL BY PCR (TYPE A & B)
INFLAPCR: NEGATIVE
INFLBPCR: NEGATIVE

## 2018-08-07 MED ORDER — ALBUTEROL SULFATE HFA 108 (90 BASE) MCG/ACT IN AERS
2.0000 | INHALATION_SPRAY | RESPIRATORY_TRACT | Status: DC | PRN
  Filled 2018-08-07: qty 6.7

## 2018-08-07 MED ORDER — BENZONATATE 100 MG PO CAPS: 100.0000 mg | ORAL_CAPSULE | Freq: Three times a day (TID) | ORAL | 0 refills | Status: DC

## 2018-08-07 MED ORDER — ACETAMINOPHEN 325 MG PO TABS
650.0000 mg | ORAL_TABLET | Freq: Once | ORAL | Status: AC | PRN
  Administered 2018-08-07: 650 mg via ORAL
  Filled 2018-08-07: qty 2

## 2018-08-07 MED ORDER — PREDNISONE 10 MG PO TABS: 40.0000 mg | ORAL_TABLET | Freq: Every day | ORAL | 0 refills | Status: DC

## 2018-08-07 MED ORDER — AZITHROMYCIN 250 MG PO TABS: 250.0000 mg | ORAL_TABLET | Freq: Every day | ORAL | 0 refills | Status: DC

## 2018-08-07 NOTE — ED Triage Notes (Signed)
C/O generalized body aches, weakness, throat soreness, headache, earache, and dry cough.   Denies n/v or diarrhea.   A/Ox4 Ambulatory in triage.

## 2018-08-07 NOTE — Discharge Instructions (Signed)
Take tylenol and ibuprofen as needed for fever and body aches. Follow up with your doctor or return here for worsening symptoms.

## 2018-08-07 NOTE — ED Provider Notes (Signed)
[facility] DEPT Provider Note   CSN: [id number] Arrival date & time: 08/07/18  1229     History   Chief Complaint Chief Complaint  Patient presents with  . URI    HPI Sonia Allen is a 33 y.o. female who presents to the ED with c/o generalized body aches, feeling tired, sore throat, headache, ear pain and dry cough. Temp up to 102.   The history is provided by the patient. No language interpreter was used.  URI  Presenting symptoms: congestion, cough, ear pain, facial pain, fatigue, fever, rhinorrhea and sore throat   Severity:  Moderate Onset quality:  Gradual Duration:  24 hours Timing:  Constant Progression:  Worsening Chronicity:  New Relieved by:  Nothing Worsened by:  Nothing Ineffective treatments:  OTC medications Associated symptoms: headaches and myalgias   Risk factors: sick contacts   Risk factors: no recent travel     Past Medical History:  Diagnosis Date  . Anemia    first and sec pregnancies  . Arrhythmia   . Chlamydia   . Gonorrhea   . Headache(784.0)   . Hemorrhoids   . Hx of migraine headaches   . Leiomyoma of uterus in pregnancy     Patient Active Problem List   Diagnosis Date Noted  . Marijuana use 06/16/2017    Past Surgical History:  Procedure Laterality Date  . INDUCED ABORTION  March  2 ,2013  . WISDOM TOOTH EXTRACTION       OB History    Gravida  8   Para  5   Term  5   Preterm  0   AB  3   Living  5     SAB  1   TAB  2   Ectopic  0   Multiple  0   Live Births  5            Home Medications    Prior to Admission medications   Medication Sig Start Date End Date Taking? Authorizing Provider  azithromycin (ZITHROMAX) 250 MG tablet Take 1 tablet (250 mg total) by mouth daily. Take first 2 tablets together, then 1 every day until finished. 08/07/18   Ashley Murrain, NP  benzonatate (TESSALON) 100 MG capsule Take 1 capsule (100 mg total) by mouth every 8 (eight) hours. 08/07/18    Ashley Murrain, NP  fluticasone (FLONASE) 50 MCG/ACT nasal spray Place 1 spray into both nostrils daily.    [provider]  ibuprofen (ADVIL,MOTRIN) 600 MG tablet Take 1 tablet (600 mg total) by mouth every 6 (six) hours. 12/19/17   Bonnita Hollow, MD  ondansetron (ZOFRAN ODT) 4 MG disintegrating tablet Take 1 tablet (4 mg total) by mouth every 8 (eight) hours as needed for nausea or vomiting. 06/28/18   Gareth Morgan, MD  predniSONE (DELTASONE) 10 MG tablet Take 4 tablets (40 mg total) by mouth daily with breakfast. 08/07/18   Ashley Murrain, NP  Prenatal Vit-Fe Fumarate-FA (PRENATAL MULTIVITAMIN) TABS tablet Take 1 tablet by mouth daily at 12 noon. Patient not taking: Reported on 02/26/2018 12/19/17   Bonnita Hollow, MD    Family History Family History  Problem Relation Age of Onset  . Other Neg Hx   . Alcohol abuse Neg Hx   . Arthritis Neg Hx   . Asthma Neg Hx   . Birth defects Neg Hx   . Cancer Neg Hx   . COPD Neg Hx   . Depression Neg  Hx   . Diabetes Neg Hx   . Drug abuse Neg Hx   . Early death Neg Hx   . Heart disease Neg Hx   . Hearing loss Neg Hx   . Hyperlipidemia Neg Hx   . Hypertension Neg Hx   . Kidney disease Neg Hx   . Learning disabilities Neg Hx   . Mental illness Neg Hx   . Mental retardation Neg Hx   . Miscarriages / Stillbirths Neg Hx   . Stroke Neg Hx   . Vision loss Neg Hx     Social History Social History   Tobacco Use  . Smoking status: Former Research scientist (life sciences)  . Smokeless tobacco: Never Used  Substance Use Topics  . Alcohol use: Yes    Comment: Pt reports socially  . Drug use: Not Currently    Frequency: 14.0 times per week    Types: Marijuana    Comment: Reports not currently smoking     Allergies   Latex   Review of Systems Review of Systems  Constitutional: Positive for fatigue and fever.  HENT: Positive for congestion, ear pain, rhinorrhea and sore throat.   Eyes: Negative for visual disturbance.  Respiratory: Positive for  cough. Negative for shortness of breath.   Cardiovascular: Negative for chest pain.  Gastrointestinal: Negative for abdominal pain, nausea and vomiting.  Genitourinary: Negative for decreased urine volume.  Musculoskeletal: Positive for myalgias.  Skin: Negative for rash.  Neurological: Positive for headaches. Negative for syncope.  Psychiatric/Behavioral: Negative for confusion.     Physical Exam Updated Vital Signs BP 99/65   Pulse 97   Temp (!) 102 F (38.9 C) (Oral)   Resp 18   LMP 07/31/2018   SpO2 100%   Physical Exam Vitals signs and nursing note reviewed.  Constitutional:      Appearance: She is well-developed.  HENT:     Head: Normocephalic.     Right Ear: Tympanic membrane normal.     Left Ear: Tympanic membrane normal.     Nose: Congestion present.     Mouth/Throat:     Mouth: Mucous membranes are moist.     Pharynx: Posterior oropharyngeal erythema present. No oropharyngeal exudate.  Eyes:     Extraocular Movements: Extraocular movements intact.     Conjunctiva/sclera: Conjunctivae normal.  Neck:     Musculoskeletal: Neck supple.  Cardiovascular:     Rate and Rhythm: Normal rate and regular rhythm.  Pulmonary:     Effort: Pulmonary effort is normal. No respiratory distress.     Breath sounds: No rales. Wheezes: occasional.  Chest:     Chest wall: No tenderness.  Abdominal:     Palpations: Abdomen is soft.     Tenderness: There is no abdominal tenderness.  Musculoskeletal: Normal range of motion.  Lymphadenopathy:     Cervical: Cervical adenopathy present.  Skin:    General: Skin is warm and dry.  Neurological:     Mental Status: She is alert and oriented to person, place, and time.     Cranial Nerves: No cranial nerve deficit.  Psychiatric:        Mood and Affect: Mood normal.      ED Treatments / Results  Labs (all labs ordered are listed, but only abnormal results are displayed) Labs Reviewed  INFLUENZA PANEL BY PCR (TYPE A & B)    Radiology Dg Chest 2 View  Result Date: 08/07/2018 CLINICAL DATA:  Cough.  Fever. EXAM: CHEST - 2 VIEW COMPARISON:  10/30/2017  FINDINGS: The heart size and mediastinal contours are within normal limits. Both lungs are clear except for slight peribronchial thickening. The visualized skeletal structures are unremarkable. IMPRESSION: Slight bronchitic changes. Electronically Signed   By: Lorriane Shire M.D.   On: 08/07/2018 16:38    Procedures Procedures (including critical care time)  Medications Ordered in ED Medications  albuterol (PROVENTIL HFA;VENTOLIN HFA) 108 (90 Base) MCG/ACT inhaler 2 puff (has no administration in time range)  acetaminophen (TYLENOL) tablet 650 mg (650 mg Oral Given 08/07/18 1354)   4::30 pm patient just taken for x-ray. Explained to patient that there was a problem in x-ray causing the delay.  Initial Impression / Assessment and Plan / ED Course  I have reviewed the triage vital signs and the nursing notes. 33 y.o. female here with fever, chills, cough and body aches stable for d/c without respiratory distress and does not appear toxic. Chest x-ray shows bronchitis. Influenza is negative. Will treat for bronchitis and patient to f/u with PCP or return here for worsening symptoms. Patient agrees with plan.   Final Clinical Impressions(s) / ED Diagnoses   Final diagnoses:  Bronchitis    ED Discharge Orders         Ordered    benzonatate (TESSALON) 100 MG capsule  Every 8 hours     08/07/18 1644    predniSONE (DELTASONE) 10 MG tablet  Daily with breakfast     08/07/18 1644    azithromycin (ZITHROMAX) 250 MG tablet  Daily     08/07/18 1644           Janit Bern [place], [place] 08/07/18 1653    Daleen Bo, MD 08/08/18 1426

## 2019-01-10 ENCOUNTER — Emergency Department (HOSPITAL_COMMUNITY)
Admission: EM | Admit: 2019-01-10 | Discharge: 2019-01-10 | Disposition: A | Discharge status: Home / Self Care | Payer: Medicaid Other | Attending: Emergency Medicine | Admitting: Emergency Medicine

## 2019-01-10 ENCOUNTER — Other Ambulatory Visit: Payer: Self-pay

## 2019-01-10 ENCOUNTER — Emergency Department (HOSPITAL_COMMUNITY): Payer: Medicaid Other

## 2019-01-10 ENCOUNTER — Encounter (HOSPITAL_COMMUNITY): Payer: Self-pay | Admitting: Obstetrics and Gynecology

## 2019-01-10 DIAGNOSIS — M791 Myalgia, unspecified site: Secondary | ICD-10-CM | POA: Insufficient documentation

## 2019-01-10 DIAGNOSIS — R509 Fever, unspecified: Secondary | ICD-10-CM | POA: Insufficient documentation

## 2019-01-10 DIAGNOSIS — R109 Unspecified abdominal pain: Secondary | ICD-10-CM | POA: Insufficient documentation

## 2019-01-10 DIAGNOSIS — Z20828 Contact with and (suspected) exposure to other viral communicable diseases: Secondary | ICD-10-CM | POA: Insufficient documentation

## 2019-01-10 DIAGNOSIS — B349 Viral infection, unspecified: Secondary | ICD-10-CM

## 2019-01-10 LAB — I-STAT BETA HCG BLOOD, ED (NOT ORDERABLE): I-stat hCG, quantitative: <5 m[IU]/mL (ref <5)

## 2019-01-10 LAB — CBC WITH DIFFERENTIAL/PLATELET
Abs Immature Granulocytes: 0.01 10*3/uL (ref 0.00–0.07)
Basophils Absolute: 0 10*3/uL (ref 0.0–0.1)
Basophils Relative: 1 %
Eosinophils Absolute: 0 10*3/uL (ref 0.0–0.5)
Eosinophils Relative: 0 %
HCT: 35.2 % — ABNORMAL LOW (ref 36.0–46.0)
Hemoglobin: 11 g/dL — ABNORMAL LOW (ref 12.0–15.0)
Immature Granulocytes: 0 %
Lymphocytes Relative: 26 %
Lymphs Abs: 1.4 10*3/uL (ref 0.7–4.0)
MCH: 28 pg (ref 26.0–34.0)
MCHC: 31.3 g/dL (ref 30.0–36.0)
MCV: 89.6 fL (ref 80.0–100.0)
Monocytes Absolute: 0.5 10*3/uL (ref 0.1–1.0)
Monocytes Relative: 9 %
Neutro Abs: 3.5 10*3/uL (ref 1.7–7.7)
Neutrophils Relative %: 64 %
Platelets: 267 10*3/uL (ref 150–400)
RBC: 3.93 MIL/uL (ref 3.87–5.11)
RDW: 13.2 % (ref 11.5–15.5)
WBC: 5.4 10*3/uL (ref 4.0–10.5)
nRBC: 0 % (ref 0.0–0.2)

## 2019-01-10 LAB — URINALYSIS, ROUTINE W REFLEX MICROSCOPIC
Bilirubin Urine: NEGATIVE
Glucose, UA: NEGATIVE mg/dL
Ketones, ur: 20 mg/dL — AB
Nitrite: POSITIVE — AB
Protein, ur: NEGATIVE mg/dL
Specific Gravity, Urine: 1.02 (ref 1.005–1.030)
pH: 5 (ref 5.0–8.0)

## 2019-01-10 LAB — COMPREHENSIVE METABOLIC PANEL
ALT: 20 U/L (ref 0–44)
AST: 21 U/L (ref 15–41)
Albumin: 3.8 g/dL (ref 3.5–5.0)
Alkaline Phosphatase: 52 U/L (ref 38–126)
Anion gap: 10 (ref 5–15)
BUN: 11 mg/dL (ref 6–20)
CO2: 24 mmol/L (ref 22–32)
Calcium: 8.6 mg/dL — ABNORMAL LOW (ref 8.9–10.3)
Chloride: 101 mmol/L (ref 98–111)
Creatinine, Ser: 0.76 mg/dL (ref 0.44–1.00)
GFR calc Af Amer: >60 mL/min (ref >60)
GFR calc non Af Amer: >60 mL/min (ref >60)
Glucose, Bld: 95 mg/dL (ref 70–99)
Potassium: 3.5 mmol/L (ref 3.5–5.1)
Sodium: 135 mmol/L (ref 135–145)
Total Bilirubin: 0.4 mg/dL (ref 0.3–1.2)
Total Protein: 7.5 g/dL (ref 6.5–8.1)

## 2019-01-10 LAB — SARS CORONAVIRUS 2 BY RT PCR (HOSPITAL ORDER, PERFORMED IN ~~LOC~~ HOSPITAL LAB): SARS Coronavirus 2: NEGATIVE

## 2019-01-10 LAB — LACTIC ACID, PLASMA: Lactic Acid, Venous: 1.2 mmol/L (ref 0.5–1.9)

## 2019-01-10 MED ORDER — ACETAMINOPHEN 325 MG PO TABS
650.0000 mg | ORAL_TABLET | Freq: Once | ORAL | Status: AC
  Administered 2019-01-10: 650 mg via ORAL
  Filled 2019-01-10: qty 2

## 2019-01-10 MED ORDER — SODIUM CHLORIDE 0.9% FLUSH: 3.0000 mL | Freq: Once | INTRAVENOUS | Status: DC

## 2019-01-10 NOTE — ED Triage Notes (Signed)
Pt reports back pain, leg pain, fevers, nausea, and abdominal pain. Pt denies V/D. Pt is unsure is she is pregnant and denies exposure to COVID-19.

## 2019-01-10 NOTE — ED Provider Notes (Signed)
[facility] DEPT Provider Note   CSN: [id number] Arrival date & time: 01/10/19  1156     History   Chief Complaint Chief Complaint  Patient presents with  . Back Pain  . Abdominal Pain  . Leg Pain    HPI Sonia Allen is a 33 y.o. female.     33 year old female presents with fever and chills with body aches x2 days.  No vomiting or diarrhea.  No cough or congestion.  Did have a sore throat which is since resolved.  Mild abdominal discomfort without urinary symptoms.  No headache or neck pain or photophobia.  Symptoms persistent he denies any sick exposures.  No treatment use prior to arrival.  Nothing makes her symptoms better.     Past Medical History:  Diagnosis Date  . Anemia    first and sec pregnancies  . Arrhythmia   . Chlamydia   . Gonorrhea   . Headache(784.0)   . Hemorrhoids   . Hx of migraine headaches   . Leiomyoma of uterus in pregnancy     Patient Active Problem List   Diagnosis Date Noted  . Marijuana use 06/16/2017    Past Surgical History:  Procedure Laterality Date  . INDUCED ABORTION  March  2 ,2013  . WISDOM TOOTH EXTRACTION       OB History    Gravida  8   Para  5   Term  5   Preterm  0   AB  3   Living  5     SAB  1   TAB  2   Ectopic  0   Multiple  0   Live Births  5            Home Medications    Prior to Admission medications   Medication Sig Start Date End Date Taking? Authorizing Provider  fluticasone (FLONASE) 50 MCG/ACT nasal spray Place 1 spray into both nostrils as needed for allergies.    Yes [provider]  benzonatate (TESSALON) 100 MG capsule Take 1 capsule (100 mg total) by mouth every 8 (eight) hours. Patient not taking: Reported on 01/10/2019 08/07/18   Ashley Murrain, NP  ibuprofen (ADVIL,MOTRIN) 600 MG tablet Take 1 tablet (600 mg total) by mouth every 6 (six) hours. Patient not taking: Reported on 01/10/2019 12/19/17   Bonnita Hollow, MD  ondansetron  (ZOFRAN ODT) 4 MG disintegrating tablet Take 1 tablet (4 mg total) by mouth every 8 (eight) hours as needed for nausea or vomiting. Patient not taking: Reported on 01/10/2019 06/28/18   Gareth Morgan, MD  Prenatal Vit-Fe Fumarate-FA (PRENATAL MULTIVITAMIN) TABS tablet Take 1 tablet by mouth daily at 12 noon. Patient not taking: Reported on 02/26/2018 12/19/17   Bonnita Hollow, MD    Family History Family History  Problem Relation Age of Onset  . Other Neg Hx   . Alcohol abuse Neg Hx   . Arthritis Neg Hx   . Asthma Neg Hx   . Birth defects Neg Hx   . Cancer Neg Hx   . COPD Neg Hx   . Depression Neg Hx   . Diabetes Neg Hx   . Drug abuse Neg Hx   . Early death Neg Hx   . Heart disease Neg Hx   . Hearing loss Neg Hx   . Hyperlipidemia Neg Hx   . Hypertension Neg Hx   . Kidney disease Neg Hx   . Learning disabilities Neg Hx   .  Mental illness Neg Hx   . Mental retardation Neg Hx   . Miscarriages / Stillbirths Neg Hx   . Stroke Neg Hx   . Vision loss Neg Hx     Social History Social History   Tobacco Use  . Smoking status: Never Smoker  . Smokeless tobacco: Never Used  Substance Use Topics  . Alcohol use: Yes    Comment: Pt reports socially  . Drug use: Not Currently    Frequency: 14.0 times per week    Types: Marijuana    Comment: Reports not currently smoking     Allergies   Latex   Review of Systems Review of Systems  All other systems reviewed and are negative.    Physical Exam Updated Vital Signs BP 121/67 (BP Location: Left Arm)   Pulse (!) 102   Temp (!) 102.1 F (38.9 C) (Oral)   Resp 18   Ht 1.626 m (5\' 4" )   LMP 12/11/2018 (Approximate)   SpO2 100%   BMI 30.55 kg/m   Physical Exam Vitals signs and nursing note reviewed.  Constitutional:      General: She is not in acute distress.    Appearance: Normal appearance. She is well-developed. She is not toxic-appearing.  HENT:     Head: Normocephalic and atraumatic.  Eyes:     General:  Lids are normal.     Conjunctiva/sclera: Conjunctivae normal.     Pupils: Pupils are equal, round, and reactive to light.  Neck:     Musculoskeletal: Normal range of motion and neck supple.     Thyroid: No thyroid mass.     Trachea: No tracheal deviation.  Cardiovascular:     Rate and Rhythm: Normal rate and regular rhythm.     Heart sounds: Normal heart sounds. No murmur. No gallop.   Pulmonary:     Effort: Pulmonary effort is normal. No respiratory distress.     Breath sounds: Normal breath sounds. No stridor. No decreased breath sounds, wheezing, rhonchi or rales.  Abdominal:     General: Bowel sounds are normal. There is no distension.     Palpations: Abdomen is soft.     Tenderness: There is no abdominal tenderness. There is no rebound.  Musculoskeletal: Normal range of motion.        General: No tenderness.  Skin:    General: Skin is warm and dry.     Findings: No abrasion or rash.  Neurological:     Mental Status: She is alert and oriented to person, place, and time.     GCS: GCS eye subscore is 4. GCS verbal subscore is 5. GCS motor subscore is 6.     Cranial Nerves: No cranial nerve deficit.     Sensory: No sensory deficit.  Psychiatric:        Speech: Speech normal.        Behavior: Behavior normal.      ED Treatments / Results  Labs (all labs ordered are listed, but only abnormal results are displayed) Labs Reviewed  CULTURE, BLOOD (ROUTINE X 2)  CULTURE, BLOOD (ROUTINE X 2)  URINE CULTURE  SARS CORONAVIRUS 2 (HOSPITAL ORDER, [employer])  LACTIC ACID, PLASMA  LACTIC ACID, PLASMA  COMPREHENSIVE METABOLIC PANEL  CBC WITH DIFFERENTIAL/PLATELET  URINALYSIS, ROUTINE W REFLEX MICROSCOPIC  I-STAT BETA HCG BLOOD, ED ([facility], [facility], [facility] ONLY)    EKG None  Radiology Dg Chest 2 View  Result Date: 01/10/2019 CLINICAL DATA:  Fever and nausea.  Pregnant. EXAM: CHEST - 2 VIEW COMPARISON:  08/07/2018 FINDINGS: The heart size and mediastinal  contours are within normal limits. Both lungs are clear. The visualized skeletal structures are unremarkable. IMPRESSION: Negative.  No active cardiopulmonary disease. Electronically Signed   By: Marlaine Hind M.D.   On: 01/10/2019 13:35    Procedures Procedures (including critical care time)  Medications Ordered in ED Medications  sodium chloride flush (NS) 0.9 % injection 3 mL (has no administration in time range)  acetaminophen (TYLENOL) tablet 650 mg (has no administration in time range)     Initial Impression / Assessment and Plan / ED Course  I have reviewed the triage vital signs and the nursing notes.  Pertinent labs & imaging results that were available during my care of the patient were reviewed by me and considered in my medical decision making (see chart for details).        Temperature improved with Tylenol.  COVID is negative.  She feels better.  Suspect viral illness and she will be discharged  Final Clinical Impressions(s) / ED Diagnoses   Final diagnoses:  None    ED Discharge Orders    None       Lacretia Leigh, MD 01/10/19 Valerie Roys

## 2019-01-12 LAB — URINE CULTURE: Culture: >=100000 — AB

## 2019-01-14 ENCOUNTER — Telehealth: Payer: Self-pay | Admitting: *Deleted

## 2019-01-14 NOTE — Telephone Encounter (Signed)
Post ED Visit - Positive Culture Follow-up  Culture report reviewed by antimicrobial stewardship pharmacist: [place] Team []  Elenor Quinones, Pharm.D. []  Heide Guile, Pharm.D., BCPS AQ-ID []  Parks Neptune, Pharm.D., BCPS []  Alycia Rossetti, Pharm.D., BCPS []  [place], [place].D., BCPS, AAHIVP []  Legrand Como, Pharm.D., BCPS, AAHIVP []  Salome Arnt, PharmD, BCPS []  Johnnette Gourd, PharmD, BCPS []  Hughes Better, PharmD, BCPS []  Leeroy Cha, PharmD []  Laqueta Linden, PharmD, BCPS []  Albertina Parr, PharmD  [place] Team []  Leodis Sias, PharmD []  Lindell Spar, PharmD []  Royetta Asal, PharmD []  Graylin Shiver, Rph []  Rema Fendt) Glennon Mac, PharmD []  Arlyn Dunning, PharmD []  Netta Cedars, PharmD []  Dia Sitter, PharmD []  Leone Haven, PharmD []  Gretta Arab, PharmD []  Theodis Shove, PharmD []  Peggyann Juba, PharmD []  Reuel Boom, PharmD   Positive urine culture, reviewed by Domenic Moras PA-C No UTI symptoms, no pyuria on UA and no further patient follow-up is required at this time.  Harlon Flor [facility] 01/14/2019, 10:24 AM

## 2019-01-15 LAB — CULTURE, BLOOD (ROUTINE X 2)
Culture: NO GROWTH
Culture: NO GROWTH
Special Requests: ADEQUATE

## 2019-05-10 ENCOUNTER — Inpatient Hospital Stay (HOSPITAL_COMMUNITY): Payer: Self-pay

## 2019-05-10 ENCOUNTER — Other Ambulatory Visit: Payer: Self-pay

## 2019-05-10 ENCOUNTER — Encounter (HOSPITAL_COMMUNITY): Payer: Self-pay

## 2019-05-10 ENCOUNTER — Inpatient Hospital Stay (HOSPITAL_COMMUNITY)
Admission: AD | Admit: 2019-05-10 | Discharge: 2019-05-11 | Disposition: A | Discharge status: Home / Self Care | Payer: Self-pay | Attending: Obstetrics & Gynecology | Admitting: Obstetrics & Gynecology

## 2019-05-10 DIAGNOSIS — Z3A01 Less than 8 weeks gestation of pregnancy: Secondary | ICD-10-CM

## 2019-05-10 DIAGNOSIS — O26892 Other specified pregnancy related conditions, second trimester: Secondary | ICD-10-CM | POA: Insufficient documentation

## 2019-05-10 DIAGNOSIS — R109 Unspecified abdominal pain: Secondary | ICD-10-CM

## 2019-05-10 DIAGNOSIS — O26899 Other specified pregnancy related conditions, unspecified trimester: Secondary | ICD-10-CM

## 2019-05-10 DIAGNOSIS — Z79899 Other long term (current) drug therapy: Secondary | ICD-10-CM | POA: Insufficient documentation

## 2019-05-10 DIAGNOSIS — M545 Low back pain: Secondary | ICD-10-CM | POA: Insufficient documentation

## 2019-05-10 DIAGNOSIS — G8929 Other chronic pain: Secondary | ICD-10-CM | POA: Insufficient documentation

## 2019-05-10 DIAGNOSIS — O26891 Other specified pregnancy related conditions, first trimester: Secondary | ICD-10-CM

## 2019-05-10 LAB — CBC
HCT: 30.1 % — ABNORMAL LOW (ref 36.0–46.0)
Hemoglobin: 9.8 g/dL — ABNORMAL LOW (ref 12.0–15.0)
MCH: 28.7 pg (ref 26.0–34.0)
MCHC: 32.6 g/dL (ref 30.0–36.0)
MCV: 88 fL (ref 80.0–100.0)
Platelets: 299 10*3/uL (ref 150–400)
RBC: 3.42 MIL/uL — ABNORMAL LOW (ref 3.87–5.11)
RDW: 14 % (ref 11.5–15.5)
WBC: 7.1 10*3/uL (ref 4.0–10.5)
nRBC: 0 % (ref 0.0–0.2)

## 2019-05-10 LAB — URINALYSIS, ROUTINE W REFLEX MICROSCOPIC
Bacteria, UA: NONE SEEN
Bilirubin Urine: NEGATIVE
Glucose, UA: NEGATIVE mg/dL
Ketones, ur: NEGATIVE mg/dL
Leukocytes,Ua: NEGATIVE
Nitrite: NEGATIVE
Protein, ur: NEGATIVE mg/dL
Specific Gravity, Urine: 1.026 (ref 1.005–1.030)
pH: 6 (ref 5.0–8.0)

## 2019-05-10 LAB — POCT PREGNANCY, URINE: Preg Test, Ur: POSITIVE — AB

## 2019-05-10 NOTE — MAU Note (Signed)
Patient presents to MAU c/o lower abd and lower back pain, headache, and nausea for past week. Patient reports +preg test at home 2 weeks ago.  LMP 04/03/19 Patient states "tooth pain and swollen gums, and throat ache that I have to take 500mg  Tylenol every day"

## 2019-05-10 NOTE — MAU Provider Note (Signed)
History     CSN: [id number]  Arrival date and time: 05/10/19 2127   None     Chief Complaint  Patient presents with  . Abdominal Pain   Sonia Allen is a 33 yo [id number] at 5.2 EGA by LMP who is presenting for bilateral lower quadrant pain. She says that she has had this pain for the past week, and with it she has had low back pain, headache, and nausea. Right now her nausea is greatly improved on it's own, as it has been intermittent, and she declines anti-nausea medications. Her abdominal pain responds to tylenol, but she felt that she should be checked out. She does have chronic pain she takes tylenol 500 mg for everyday.   OB History    Gravida  9   Para  5   Term  5   Preterm  0   AB  3   Living  5     SAB  1   TAB  2   Ectopic  0   Multiple  0   Live Births  5           Past Medical History:  Diagnosis Date  . Anemia    first and sec pregnancies  . Arrhythmia   . Chlamydia   . Gonorrhea   . Headache(784.0)   . Hemorrhoids   . Hx of migraine headaches   . Leiomyoma of uterus in pregnancy     Past Surgical History:  Procedure Laterality Date  . INDUCED ABORTION  March  2 ,2013  . WISDOM TOOTH EXTRACTION      Family History  Problem Relation Age of Onset  . Other Neg Hx   . Alcohol abuse Neg Hx   . Arthritis Neg Hx   . Asthma Neg Hx   . Birth defects Neg Hx   . Cancer Neg Hx   . COPD Neg Hx   . Depression Neg Hx   . Diabetes Neg Hx   . Drug abuse Neg Hx   . Early death Neg Hx   . Heart disease Neg Hx   . Hearing loss Neg Hx   . Hyperlipidemia Neg Hx   . Hypertension Neg Hx   . Kidney disease Neg Hx   . Learning disabilities Neg Hx   . Mental illness Neg Hx   . Mental retardation Neg Hx   . Miscarriages / Stillbirths Neg Hx   . Stroke Neg Hx   . Vision loss Neg Hx     Social History   Tobacco Use  . Smoking status: Never Smoker  . Smokeless tobacco: Never Used  Substance Use Topics  . Alcohol use: Not Currently    Comment: Pt  reports socially  . Drug use: Not Currently    Frequency: 14.0 times per week    Types: Marijuana    Comment: Reports not currently smoking    Allergies:  Allergies  Allergen Reactions  . Latex Rash    Burning sensation    Medications Prior to Admission  Medication Sig Dispense Refill Last Dose  . benzonatate (TESSALON) 100 MG capsule Take 1 capsule (100 mg total) by mouth every 8 (eight) hours. (Patient not taking: Reported on 01/10/2019) 21 capsule 0   . fluticasone (FLONASE) 50 MCG/ACT nasal spray Place 1 spray into both nostrils as needed for allergies.      Marland Kitchen ibuprofen (ADVIL,MOTRIN) 600 MG tablet Take 1 tablet (600 mg total) by mouth every 6 (six) hours. (Patient not taking:  Reported on 01/10/2019) 30 tablet 0   . ondansetron (ZOFRAN ODT) 4 MG disintegrating tablet Take 1 tablet (4 mg total) by mouth every 8 (eight) hours as needed for nausea or vomiting. (Patient not taking: Reported on 01/10/2019) 15 tablet 0   . Prenatal Vit-Fe Fumarate-FA (PRENATAL MULTIVITAMIN) TABS tablet Take 1 tablet by mouth daily at 12 noon.       Review of Systems  All other systems reviewed and are negative.  Physical Exam   Blood pressure (!) 114/55, pulse 76, temperature 98.5 F (36.9 C), temperature source Oral, resp. rate 18, height 5\' 4"  (1.626 m), weight 77.6 kg, last menstrual period 04/03/2019, SpO2 100 %, not currently breastfeeding.  Physical Exam  Nursing note and vitals reviewed. Constitutional: She is oriented to person, place, and time. She appears well-developed and well-nourished.  HENT:  Head: Normocephalic and atraumatic.  Eyes: Pupils are equal, round, and reactive to light. Conjunctivae and EOM are normal.  Neck: Normal range of motion. Neck supple.  Cardiovascular: Normal rate, regular rhythm, normal heart sounds and intact distal pulses.  Respiratory: Effort normal and breath sounds normal.  GI: Soft. She exhibits no distension and no mass. There is abdominal tenderness  (mild, to palpation over the patient's bilateral inguinal regions). There is no rebound and no guarding.  Musculoskeletal: Normal range of motion.  Neurological: She is alert and oriented to person, place, and time. She has normal reflexes.  Skin: Skin is warm and dry.  Psychiatric: She has a normal mood and affect. Her behavior is normal. Judgment and thought content normal.    MAU Course  Procedures  MDM -HCG quant, and [place] (r/o ectopic) -CMP, CBC -UA  Results for orders placed or performed during the hospital encounter of 05/10/19 (from the past 24 hour(s))  Pregnancy, urine POC     Status: Abnormal   Collection Time: 05/10/19  9:51 PM  Result Value Ref Range   Preg Test, Ur POSITIVE (A) NEGATIVE  Urinalysis, Routine w reflex microscopic     Status: Abnormal   Collection Time: 05/10/19 10:38 PM  Result Value Ref Range   Color, Urine YELLOW YELLOW   APPearance CLEAR CLEAR   Specific Gravity, Urine 1.026 1.005 - 1.030   pH 6.0 5.0 - 8.0   Glucose, UA NEGATIVE NEGATIVE mg/dL   Hgb urine dipstick MODERATE (A) NEGATIVE   Bilirubin Urine NEGATIVE NEGATIVE   Ketones, ur NEGATIVE NEGATIVE mg/dL   Protein, ur NEGATIVE NEGATIVE mg/dL   Nitrite NEGATIVE NEGATIVE   Leukocytes,Ua NEGATIVE NEGATIVE   RBC / HPF 0-5 0 - 5 RBC/hpf   WBC, UA 0-5 0 - 5 WBC/hpf   Bacteria, UA NONE SEEN NONE SEEN   Squamous Epithelial / LPF 0-5 0 - 5   Mucus PRESENT   Comprehensive metabolic panel     Status: Abnormal   Collection Time: 05/10/19 11:19 PM  Result Value Ref Range   Sodium 138 135 - 145 mmol/L   Potassium 3.9 3.5 - 5.1 mmol/L   Chloride 107 98 - 111 mmol/L   CO2 20 (L) 22 - 32 mmol/L   Glucose, Bld 84 70 - 99 mg/dL   BUN 18 6 - 20 mg/dL   Creatinine, Ser 0.60 0.44 - 1.00 mg/dL   Calcium 9.0 8.9 - 10.3 mg/dL   Total Protein 6.3 (L) 6.5 - 8.1 g/dL   Albumin 3.7 3.5 - 5.0 g/dL   AST 11 (L) 15 - 41 U/L   ALT 11 0 - 44 U/L  Alkaline Phosphatase 46 38 - 126 U/L   Total Bilirubin 0.4 0.3 -  1.2 mg/dL   GFR calc non Af Amer >60 >60 mL/min   GFR calc Af Amer >60 >60 mL/min   Anion gap 11 5 - 15  CBC     Status: Abnormal   Collection Time: 05/10/19 11:19 PM  Result Value Ref Range   WBC 7.1 4.0 - 10.5 K/uL   RBC 3.42 (L) 3.87 - 5.11 MIL/uL   Hemoglobin 9.8 (L) 12.0 - 15.0 g/dL   HCT 30.1 (L) 36.0 - 46.0 %   MCV 88.0 80.0 - 100.0 fL   MCH 28.7 26.0 - 34.0 pg   MCHC 32.6 30.0 - 36.0 g/dL   RDW 14.0 11.5 - 15.5 %   Platelets 299 150 - 400 K/uL   nRBC 0.0 0.0 - 0.2 %  hCG, quantitative, pregnancy     Status: Abnormal   Collection Time: 05/10/19 11:19 PM  Result Value Ref Range   hCG, Beta Chain, Quant, S 13,213 (H) <5 mIU/mL   US Ob Less Than 14 Weeks With Ob Transvaginal  Result Date: 05/11/2019 CLINICAL DATA:  Pelvic pain for 1 week with positive pregnancy test EXAM: OBSTETRIC <14 WK [place] AND TRANSVAGINAL OB US TECHNIQUE: Both transabdominal and transvaginal ultrasound examinations were performed for complete evaluation of the gestation as well as the maternal uterus, adnexal regions, and pelvic cul-de-sac. Transvaginal technique was performed to assess early pregnancy. COMPARISON:  None. FINDINGS: Intrauterine gestational sac: Present Yolk sac:  Probable Embryo:  Absent MSD: 10.4 mm mm   5 w   5 d Subchorionic hemorrhage:  None visualized. Maternal uterus/adnexae: Small corpus luteum cyst is noted within the right ovary. Calcified uterine fibroid is noted on the right measuring 3.7 cm. IMPRESSION: Probable early intrauterine gestational sac, but no definite yolk sac, fetal pole, or cardiac activity yet visualized. Recommend follow-up quantitative B-HCG levels and follow-up US in 14 days to assess viability. This recommendation follows SRU consensus guidelines: Diagnostic Criteria for Nonviable Pregnancy Early in the First Trimester. Alta Corning Med [id number]. Electronically Signed   By: Inez Catalina M.D.   On: 05/11/2019 00:03   REASSESSMENT 12:00: Abdominal pain resolved on  it's own Assessment and Plan  33 yo [id number]  At 5.5 EGA by first trimester [place] presenting with intermittent abdominal pain with resolved on reassessment. -Labs negative -Recommend follow up in the office in 14 days to assess viability of pregnancy -Message to [place] office sent (she has previously received care at [place] to home -Return precautions given  Daana Petrasek L Sameer Teeple 05/11/2019, 12:52 AM

## 2019-05-11 ENCOUNTER — Telehealth: Payer: Self-pay | Admitting: Family Medicine

## 2019-05-11 LAB — COMPREHENSIVE METABOLIC PANEL
ALT: 11 U/L (ref 0–44)
AST: 11 U/L — ABNORMAL LOW (ref 15–41)
Albumin: 3.7 g/dL (ref 3.5–5.0)
Alkaline Phosphatase: 46 U/L (ref 38–126)
Anion gap: 11 (ref 5–15)
BUN: 18 mg/dL (ref 6–20)
CO2: 20 mmol/L — ABNORMAL LOW (ref 22–32)
Calcium: 9 mg/dL (ref 8.9–10.3)
Chloride: 107 mmol/L (ref 98–111)
Creatinine, Ser: 0.6 mg/dL (ref 0.44–1.00)
GFR calc Af Amer: >60 mL/min (ref >60)
GFR calc non Af Amer: >60 mL/min (ref >60)
Glucose, Bld: 84 mg/dL (ref 70–99)
Potassium: 3.9 mmol/L (ref 3.5–5.1)
Sodium: 138 mmol/L (ref 135–145)
Total Bilirubin: 0.4 mg/dL (ref 0.3–1.2)
Total Protein: 6.3 g/dL — ABNORMAL LOW (ref 6.5–8.1)

## 2019-05-11 LAB — HCG, QUANTITATIVE, PREGNANCY: hCG, Beta Chain, Quant, S: 13213 m[IU]/mL — ABNORMAL HIGH (ref <5)

## 2019-05-11 NOTE — Discharge Instructions (Signed)
Abdominal Pain During Pregnancy ° °Belly (abdominal) pain is common during pregnancy. There are many possible causes. Most of the time, it is not a serious problem. Other times, it can be a sign that something is wrong with the pregnancy. Always tell your doctor if you have belly pain. °Follow these instructions at home: °· Do not have sex or put anything in your vagina until your pain goes away completely. °· Get plenty of rest until your pain gets better. °· Drink enough fluid to keep your pee (urine) pale yellow. °· Take over-the-counter and prescription medicines only as told by your doctor. °· Keep all follow-up visits as told by your doctor. This is important. °Contact a doctor if: °· Your pain continues or gets worse after resting. °· You have lower belly pain that: °? Comes and goes at regular times. °? Spreads to your back. °? Feels like menstrual cramps. °· You have pain or burning when you pee (urinate). °Get help right away if: °· You have a fever or chills. °· You have vaginal bleeding. °· You are leaking fluid from your vagina. °· You are passing tissue from your vagina. °· You throw up (vomit) for more than 24 hours. °· You have watery poop (diarrhea) for more than 24 hours. °· Your baby is moving less than usual. °· You feel very weak or faint. °· You have shortness of breath. °· You have very bad pain in your upper belly. °Summary °· Belly (abdominal) pain is common during pregnancy. There are many possible causes. °· If you have belly pain during pregnancy, tell your doctor right away. °· Keep all follow-up visits as told by your doctor. This is important. °This information is not intended to replace advice given to you by your health care provider. Make sure you discuss any questions you have with your health care provider. °Document Released: 06/05/2009 Document Revised: 10/05/2018 Document Reviewed: 09/19/2016 °Elsevier Patient Education © 2020 Elsevier Inc. ° °

## 2019-05-11 NOTE — Telephone Encounter (Signed)
Attempted to call patient with her f/u lab appointment ( 11/24 @ 1:30). No answer, left voicemail with the appointment information. Patient instructed to give the office a call back if needing to reschedule.

## 2019-05-20 ENCOUNTER — Other Ambulatory Visit: Payer: Self-pay | Admitting: *Deleted

## 2019-05-20 DIAGNOSIS — Z3490 Encounter for supervision of normal pregnancy, unspecified, unspecified trimester: Secondary | ICD-10-CM

## 2019-05-25 ENCOUNTER — Other Ambulatory Visit: Payer: Self-pay

## 2019-05-25 ENCOUNTER — Other Ambulatory Visit: Payer: Medicaid Other

## 2019-05-25 DIAGNOSIS — Z3490 Encounter for supervision of normal pregnancy, unspecified, unspecified trimester: Secondary | ICD-10-CM

## 2019-05-25 DIAGNOSIS — O3680X0 Pregnancy with inconclusive fetal viability, not applicable or unspecified: Secondary | ICD-10-CM

## 2019-05-25 NOTE — Progress Notes (Unsigned)
Patient came to office today for bhcg. Upon chart review, patient was supposed to be scheduled for OB f/u. Scheduled for tomorrow 11/25 @ 145 & patient informed.

## 2019-05-26 ENCOUNTER — Ambulatory Visit (HOSPITAL_COMMUNITY)
Admission: RE | Admit: 2019-05-26 | Discharge: 2019-05-26 | Disposition: A | Discharge status: Home / Self Care | Payer: Self-pay | Source: Ambulatory Visit | Attending: Family Medicine | Admitting: Family Medicine

## 2019-05-26 ENCOUNTER — Encounter: Payer: Self-pay | Admitting: Family Medicine

## 2019-05-26 ENCOUNTER — Ambulatory Visit (INDEPENDENT_AMBULATORY_CARE_PROVIDER_SITE_OTHER): Payer: Self-pay

## 2019-05-26 DIAGNOSIS — Z3490 Encounter for supervision of normal pregnancy, unspecified, unspecified trimester: Secondary | ICD-10-CM

## 2019-05-26 DIAGNOSIS — O3680X0 Pregnancy with inconclusive fetal viability, not applicable or unspecified: Secondary | ICD-10-CM

## 2019-05-26 NOTE — Progress Notes (Signed)
Patient seen and assessed by nursing staff during this encounter. I have reviewed the chart and agree with the documentation and plan.  Marcille Buffy DNP, CNM  05/26/19  4:30 PM

## 2019-05-26 NOTE — Progress Notes (Signed)
Pt here today for OB US results for viability.  Notified Marcille Buffy, NP pt results.  Pt informed that she has a viable pregnancy with EDD 01/04/20, 8w 1d today, and FHR 171 bpm.  Pt denies any pain or bleeding.  Medications and allergies reviewed.  [place] pics given.  Proof of pregnancy letter provided by the front office to start [facility].   Mel Almond, RN  05/26/19

## 2019-06-18 ENCOUNTER — Telehealth: Payer: Self-pay | Admitting: *Deleted

## 2019-06-18 ENCOUNTER — Other Ambulatory Visit: Payer: Self-pay

## 2019-06-18 ENCOUNTER — Ambulatory Visit (INDEPENDENT_AMBULATORY_CARE_PROVIDER_SITE_OTHER): Payer: Medicaid Other | Admitting: *Deleted

## 2019-06-18 DIAGNOSIS — Z3490 Encounter for supervision of normal pregnancy, unspecified, unspecified trimester: Secondary | ICD-10-CM

## 2019-06-18 DIAGNOSIS — D219 Benign neoplasm of connective and other soft tissue, unspecified: Secondary | ICD-10-CM

## 2019-06-18 DIAGNOSIS — O99210 Obesity complicating pregnancy, unspecified trimester: Secondary | ICD-10-CM | POA: Insufficient documentation

## 2019-06-18 DIAGNOSIS — O99211 Obesity complicating pregnancy, first trimester: Secondary | ICD-10-CM

## 2019-06-18 DIAGNOSIS — O0992 Supervision of high risk pregnancy, unspecified, second trimester: Secondary | ICD-10-CM | POA: Insufficient documentation

## 2019-06-18 DIAGNOSIS — Z3A10 10 weeks gestation of pregnancy: Secondary | ICD-10-CM

## 2019-06-18 DIAGNOSIS — K0889 Other specified disorders of teeth and supporting structures: Secondary | ICD-10-CM | POA: Insufficient documentation

## 2019-06-18 MED ORDER — PRENATAL VITAMIN PLUS LOW IRON 27-1 MG PO TABS: 1.0000 | ORAL_TABLET | Freq: Every day | ORAL | 12 refills | Status: DC

## 2019-06-18 NOTE — Progress Notes (Signed)
I connected with  [name] on 06/18/19 at 10:30 AM EST by telephone and verified that I am speaking with the correct person using two identifiers.   I discussed the limitations, risks, security and privacy concerns of performing an evaluation and management service by telephone and the availability of in person appointments. I also discussed with the patient that there may be a patient responsible charge related to this service. The patient expressed understanding and agreed to proceed. Explained I am completing her New OB Intake today. We discussed Her EDD and that it is based on  sure LMP . I reviewed her allergies, meds, OB History, Medical /Surgical history, and appropriate screenings. I explained I will send her the Babys[employer]s app- app sent to her while on phone.  I explained we will send a blood pressure cuff to [facility] once she has active Medicaid ( she has applied for pregnancy medicaid) that will fill that prescription and they  will call her to verify her information. I asked her to bring the blood pressure cuff with her to her next  ob appointment after she gets the cuff so we can show her how to use it. Explained  then we will have her take her blood pressure weekly and enter into the app. Explained she will have some visits in office and some virtually. She already has Community education officer. Reviewed appointment date/ time with her , our location and to wear mask, no visitors. Explained she will have exam, ob bloodwork, hemoglobin a1C, cbg , genetic testing if desired. I scheduled an Korea at 19 weeks and gave her the appointment. She c/o dental pain and that she does not have a dentist. I informed her we will see if can make a referral to the [facility] dental office and will let her know. She voices understanding.  Jasmine Mcbeth,RN 06/18/2019  10:54 AM

## 2019-06-18 NOTE — Telephone Encounter (Signed)
I called Lilyannah back and informed her she can call [facility] at [phone] to schedule the appointment- she will need to pick up a dental letter when she is in the office next week; and we will send records after her visit to the dental clinic. She voices understanding. Kinzlee Selvy,RN

## 2019-06-18 NOTE — Patient Instructions (Signed)

## 2019-06-21 NOTE — Progress Notes (Signed)
Patient seen and assessed by nursing staff during this encounter. I have reviewed the chart and agree with the documentation and plan.  Kerry Hough, PA-C 06/21/2019 11:09 AM

## 2019-06-23 ENCOUNTER — Other Ambulatory Visit: Payer: Self-pay

## 2019-06-23 ENCOUNTER — Encounter (HOSPITAL_COMMUNITY): Payer: Self-pay

## 2019-06-23 ENCOUNTER — Ambulatory Visit (HOSPITAL_COMMUNITY)
Admission: EM | Admit: 2019-06-23 | Discharge: 2019-06-23 | Disposition: A | Discharge status: Home / Self Care | Payer: Medicaid Other | Attending: Family Medicine | Admitting: Family Medicine

## 2019-06-23 ENCOUNTER — Encounter: Payer: Medicaid Other | Admitting: Medical

## 2019-06-23 DIAGNOSIS — N3090 Cystitis, unspecified without hematuria: Secondary | ICD-10-CM

## 2019-06-23 DIAGNOSIS — K0889 Other specified disorders of teeth and supporting structures: Secondary | ICD-10-CM

## 2019-06-23 LAB — POCT URINALYSIS DIP (DEVICE)
Bilirubin Urine: NEGATIVE
Glucose, UA: NEGATIVE mg/dL
Ketones, ur: NEGATIVE mg/dL
Nitrite: NEGATIVE
Protein, ur: NEGATIVE mg/dL
Specific Gravity, Urine: 1.025 (ref 1.005–1.030)
Urobilinogen, UA: 0.2 mg/dL (ref 0.0–1.0)
pH: 7 (ref 5.0–8.0)

## 2019-06-23 MED ORDER — CEPHALEXIN 500 MG PO CAPS: 500.0000 mg | ORAL_CAPSULE | Freq: Three times a day (TID) | ORAL | 0 refills | Status: DC

## 2019-06-23 NOTE — ED Triage Notes (Signed)
Patient presents to Urgent Care with complaints of burning and odor with urination since a few days ago. Patient reports she also has a few teeth on the left side that are "rotting out" and they are painful.

## 2019-06-24 NOTE — ED Provider Notes (Signed)
[place]   [id number] 06/23/19 Arrival Time: [id number]  ASSESSMENT & PLAN:  1. Pain, dental   2. Cystitis     No sign of abscess requiring I&D at this time. Discussed.  Meds ordered this encounter  Medications  . cephALEXin (KEFLEX) 500 MG capsule    Sig: Take 1 capsule (500 mg total) by mouth 3 (three) times daily.    Dispense:  21 capsule    Refill:  0   Explained antibiotic should cover both dental infection and UTI. OTC ibuprofen with food. Urine culture sent.  Dental resource written instructions given. She will schedule dental evaluation as soon as possible if not improving over the next 24-48 hours.  Reviewed expectations re: course of current medical issues. Questions answered. Outlined signs and symptoms indicating need for more acute intervention. Patient verbalized understanding. After Visit Summary given.   SUBJECTIVE:  Sonia Allen is a 33 y.o. female who reports gradual onset of left upper dental pain described as aching/throbbing. Present "for a long time"; off/on pain; worse over past few days. Fever: absent. Tolerating PO intake but reports pain with chewing. Normal swallowing. She does not see a dentist regularly. No neck swelling or pain. OTC analgesics without relief.  Also reports mild dysuria with urine odor for a few days. Gradual onset. No vaginal discharge. No concerns over STDs. H/O UTI with similar symptoms. No abdominal, back, flank, pelvic pain reported.  ROS: As per HPI. All other systems negative.   OBJECTIVE: Vitals:   06/23/19 1418  BP: (!) 107/57  Pulse: 91  Resp: 16  Temp: 98.2 F (36.8 C)  TempSrc: Oral  SpO2: 100%    General appearance: alert; no distress HENT: normocephalic; atraumatic; dentition: poor; left upper rear gums without areas of fluctuance, drainage, or bleeding and with tenderness to palpation; normal jaw movement without difficulty Neck: supple without LAD; FROM; trachea midline CV: regular Lungs:  normal respirations; unlabored Abd: soft; NT Skin: warm and dry Ext: no edema Neuro: normal gait Psychological: alert and cooperative; normal mood and affect  Allergies  Allergen Reactions  . Latex Rash    Burning sensation    Past Medical History:  Diagnosis Date  . Anemia    first and sec pregnancies  . Arrhythmia   . Chlamydia   . Gonorrhea   . Headache(784.0)   . Hemorrhoids   . Hx of migraine headaches   . Leiomyoma of uterus in pregnancy    Social History   Socioeconomic History  . Marital status: Single    Spouse name: Not on file  . Number of children: Not on file  . Years of education: Not on file  . Highest education level: Not on file  Occupational History  . Not on file  Tobacco Use  . Smoking status: Never Smoker  . Smokeless tobacco: Never Used  Substance and Sexual Activity  . Alcohol use: Not Currently    Comment: Pt reports socially  . Drug use: Not Currently    Frequency: 14.0 times per week    Types: Marijuana    Comment: Reports not currently smoking  . Sexual activity: Not Currently    Birth control/protection: None  Other Topics Concern  . Not on file  Social History Narrative  . Not on file   Social Determinants of Health   Financial Resource Strain:   . Difficulty of Paying Living Expenses: Not on file  Food Insecurity: No Food Insecurity  . Worried About Charity fundraiser in  the Last Year: Never true  . Ran Out of Food in the Last Year: Never true  Transportation Needs: No Transportation Needs  . Lack of Transportation (Medical): No  . Lack of Transportation (Non-Medical): No  Physical Activity:   . Days of Exercise per Week: Not on file  . Minutes of Exercise per Session: Not on file  Stress:   . Feeling of Stress : Not on file  Social Connections:   . Frequency of Communication with Friends and Family: Not on file  . Frequency of Social Gatherings with Friends and Family: Not on file  . Attends Religious Services: Not  on file  . Active Member of Clubs or Organizations: Not on file  . Attends Archivist Meetings: Not on file  . Marital Status: Not on file  Intimate Partner Violence:   . Fear of Current or Ex-Partner: Not on file  . Emotionally Abused: Not on file  . Physically Abused: Not on file  . Sexually Abused: Not on file   Family History  Problem Relation Age of Onset  . Healthy Mother   . Healthy Father   . Other Neg Hx   . Alcohol abuse Neg Hx   . Arthritis Neg Hx   . Asthma Neg Hx   . Birth defects Neg Hx   . Cancer Neg Hx   . COPD Neg Hx   . Depression Neg Hx   . Diabetes Neg Hx   . Drug abuse Neg Hx   . Early death Neg Hx   . Heart disease Neg Hx   . Hearing loss Neg Hx   . Hyperlipidemia Neg Hx   . Hypertension Neg Hx   . Kidney disease Neg Hx   . Learning disabilities Neg Hx   . Mental illness Neg Hx   . Mental retardation Neg Hx   . Miscarriages / Stillbirths Neg Hx   . Stroke Neg Hx   . Vision loss Neg Hx    Past Surgical History:  Procedure Laterality Date  . INDUCED ABORTION  March  2 ,2013  . WISDOM TOOTH EXTRACTION       Vanessa Kick, MD 06/24/19 [id number]

## 2019-06-29 ENCOUNTER — Ambulatory Visit (INDEPENDENT_AMBULATORY_CARE_PROVIDER_SITE_OTHER): Payer: Medicaid Other | Admitting: Student

## 2019-06-29 ENCOUNTER — Other Ambulatory Visit: Payer: Self-pay

## 2019-06-29 ENCOUNTER — Other Ambulatory Visit: Payer: Self-pay | Admitting: Student

## 2019-06-29 ENCOUNTER — Other Ambulatory Visit: Payer: Self-pay | Admitting: *Deleted

## 2019-06-29 ENCOUNTER — Encounter: Payer: Self-pay | Admitting: Student

## 2019-06-29 ENCOUNTER — Other Ambulatory Visit (HOSPITAL_COMMUNITY)
Admission: RE | Admit: 2019-06-29 | Discharge: 2019-06-29 | Disposition: A | Discharge status: Home / Self Care | Payer: Medicaid Other | Source: Ambulatory Visit | Attending: Student | Admitting: Student

## 2019-06-29 VITALS — BP 110/68 | HR 87 | Wt 170.6 lb

## 2019-06-29 DIAGNOSIS — Z3491 Encounter for supervision of normal pregnancy, unspecified, first trimester: Secondary | ICD-10-CM | POA: Insufficient documentation

## 2019-06-29 DIAGNOSIS — Z3A12 12 weeks gestation of pregnancy: Secondary | ICD-10-CM | POA: Diagnosis not present

## 2019-06-29 DIAGNOSIS — O09299 Supervision of pregnancy with other poor reproductive or obstetric history, unspecified trimester: Secondary | ICD-10-CM

## 2019-06-29 DIAGNOSIS — K0889 Other specified disorders of teeth and supporting structures: Secondary | ICD-10-CM

## 2019-06-29 DIAGNOSIS — O09291 Supervision of pregnancy with other poor reproductive or obstetric history, first trimester: Secondary | ICD-10-CM

## 2019-06-29 MED ORDER — BLOOD PRESSURE MONITORING DEVI: 1.0000 | 0 refills | Status: DC

## 2019-06-29 MED ORDER — DOCUSATE SODIUM 100 MG PO CAPS: 100.0000 mg | ORAL_CAPSULE | Freq: Three times a day (TID) | ORAL | 0 refills | Status: DC

## 2019-06-29 NOTE — Progress Notes (Signed)
  Subjective:    Sonia Allen is being seen today for her first obstetrical visit.  This is not a planned pregnancy. She is at [redacted]w[redacted]d gestation. Her obstetrical history is significant for she said she had GDM with her fourth baby and she also has had GHTN. She can't remember if she had to take medication or not for these conditions. . Relationship with FOB: significant other, not living together. Patient does not intend to breast feed. Pregnancy history fully reviewed.  Patient reports no complaints.  Review of Systems:   Review of Systems  Constitutional: Negative.   HENT: Negative.   Respiratory: Negative.     Objective:     BP 110/68   Pulse 87   Wt 170 lb 9.6 oz (77.4 kg)   LMP 04/03/2019 (Exact Date)   BMI 29.28 kg/m  Physical Exam  Constitutional: She is oriented to person, place, and time. She appears well-developed.  Respiratory: Effort normal.  GI: Soft.  Musculoskeletal:     Cervical back: Normal range of motion.  Neurological: She is alert and oriented to person, place, and time.  Skin: Skin is warm and dry.    Exam    Assessment:    Pregnancy: [id number] Patient Active Problem List   Diagnosis Date Noted  . Supervision of low-risk pregnancy 06/18/2019  . Fibroids 06/18/2019  . Tooth pain 06/18/2019  . Obesity in pregnancy 06/18/2019  . Marijuana use 06/16/2017       Plan:     Initial labs drawn. Prenatal vitamins. Problem list reviewed and updated. AFP3 discussed: ordered. Role of ultrasound in pregnancy discussed; fetal survey: ordered. Amniocentesis discussed: not indicated. Follow up in 5 weeks. 50 % of 40 min visit spent on counseling and coordination of care.  -Do pap at 28 weeks; patient does not want pap today/  -Did not receive BP cuff, CMA did not receive BabyRx -Would like NIPS -[place] scheduled for Feb 19.  -Explained that next visits will be on MyChart until in person at 28 weeks.  -Patient agrees to come back for early 1 hour in one  week  -Will schedule lab visit for AFP at [place] appt in Feb .  -CMA will check on her BabyRx email as BabyRx email was sent on 12/18. -Review of chart shows that patient had GHTN and had taken procardia and norvasc postpartum. She is not on any medication now.  -Review of chart shows that patient had GDM with 4th baby but was diet controlled.  -will start on baby ASA [facility] 06/30/2019

## 2019-06-30 ENCOUNTER — Encounter: Payer: Self-pay | Admitting: *Deleted

## 2019-06-30 LAB — POCT URINALYSIS DIP (DEVICE)
Bilirubin Urine: NEGATIVE
Glucose, UA: NEGATIVE mg/dL
Hgb urine dipstick: NEGATIVE
Ketones, ur: NEGATIVE mg/dL
Leukocytes,Ua: NEGATIVE
Nitrite: NEGATIVE
Protein, ur: NEGATIVE mg/dL
Specific Gravity, Urine: 1.025 (ref 1.005–1.030)
Urobilinogen, UA: 0.2 mg/dL (ref 0.0–1.0)
pH: 7 (ref 5.0–8.0)

## 2019-06-30 LAB — CERVICOVAGINAL ANCILLARY ONLY
Chlamydia: NEGATIVE
Comment: NEGATIVE
Comment: NORMAL
Neisseria Gonorrhea: NEGATIVE

## 2019-06-30 LAB — PROTEIN / CREATININE RATIO, URINE
Creatinine, Urine: 191.3 mg/dL
Protein, Ur: 11.2 mg/dL
Protein/Creat Ratio: 59 mg/g creat (ref 0–200)

## 2019-06-30 MED ORDER — ASPIRIN EC 81 MG PO TBEC: 81.0000 mg | DELAYED_RELEASE_TABLET | Freq: Every day | ORAL | 5 refills | Status: DC

## 2019-06-30 NOTE — Progress Notes (Signed)
Medicaid Home Form Completed-06/29/19

## 2019-07-01 LAB — OBSTETRIC PANEL, INCLUDING HIV
Basophils Absolute: 0 10*3/uL (ref 0.0–0.2)
Basos: 1 %
EOS (ABSOLUTE): 0.1 10*3/uL (ref 0.0–0.4)
Eos: 1 %
HIV Screen 4th Generation wRfx: NONREACTIVE
Hematocrit: 29.8 % — ABNORMAL LOW (ref 34.0–46.6)
Hemoglobin: 9.9 g/dL — ABNORMAL LOW (ref 11.1–15.9)
Hepatitis B Surface Ag: NEGATIVE
Immature Grans (Abs): 0 10*3/uL (ref 0.0–0.1)
Immature Granulocytes: 0 %
Lymphocytes Absolute: 1.5 10*3/uL (ref 0.7–3.1)
Lymphs: 26 %
MCH: 28.8 pg (ref 26.6–33.0)
MCHC: 33.2 g/dL (ref 31.5–35.7)
MCV: 87 fL (ref 79–97)
Monocytes Absolute: 0.2 10*3/uL (ref 0.1–0.9)
Monocytes: 4 %
Neutrophils Absolute: 4 10*3/uL (ref 1.4–7.0)
Neutrophils: 68 %
Platelets: 285 10*3/uL (ref 150–450)
RBC: 3.44 x10E6/uL — ABNORMAL LOW (ref 3.77–5.28)
RDW: 13.1 % (ref 11.7–15.4)
RPR Ser Ql: NONREACTIVE
Rh Factor: POSITIVE
Rubella Antibodies, IGG: 14.8 index (ref >0.99)
WBC: 5.8 10*3/uL (ref 3.4–10.8)

## 2019-07-01 LAB — AB SCR+ANTIBODY ID: Antibody Screen: POSITIVE — AB

## 2019-07-01 LAB — TSH: TSH: 0.374 u[IU]/mL — ABNORMAL LOW (ref 0.450–4.500)

## 2019-07-01 LAB — HEMOGLOBIN A1C
Est. average glucose Bld gHb Est-mCnc: 105 mg/dL
Hgb A1c MFr Bld: 5.3 % (ref 4.8–5.6)

## 2019-07-02 NOTE — L&D Delivery Note (Addendum)
Delivery Note At 9:57 AM a viable female was delivered via Vaginal, Spontaneous (Presentation: Right Occiput Anterior).  APGAR: 4, 6 ; weight: 3084g Placenta status: Spontaneous, Intact.  Cord: 3 vessels with the following complications: None.  Cord pH: 7.1  Anesthesia: None Episiotomy: None Lacerations: None Suture Repair:  NA Est. Blood Loss (mL): 250  Mom to postpartum.  Baby to Couplet care / Skin to Skin.  Quillian Quince 01/09/2020, 10:40 AM  I attest that I was gowned and gloved for this delivery in its entirety and I agree with the above documentation.   Called to room by RN as patient now 9 cm and FHR difficult to trace; after AROM patient delivered infant with two contractions. Infant placed on maternal abdomen, dried and stimulated. With poor tone, poor color and no respirations, infant infant brought to warmer and Infant Code Blue called. NICU team assumes care of infant. After assessment and interventions, infant returned to mom for skin to skin prior to transfer to PP.   Maye Hides

## 2019-07-03 ENCOUNTER — Encounter: Payer: Self-pay | Admitting: Student

## 2019-07-03 DIAGNOSIS — R809 Proteinuria, unspecified: Secondary | ICD-10-CM | POA: Insufficient documentation

## 2019-07-04 LAB — URINE CULTURE, OB REFLEX

## 2019-07-04 LAB — CULTURE, OB URINE

## 2019-07-06 ENCOUNTER — Other Ambulatory Visit: Payer: Self-pay | Admitting: Student

## 2019-07-06 ENCOUNTER — Other Ambulatory Visit: Payer: Self-pay | Admitting: *Deleted

## 2019-07-06 DIAGNOSIS — Z8632 Personal history of gestational diabetes: Secondary | ICD-10-CM

## 2019-07-06 DIAGNOSIS — Z3480 Encounter for supervision of other normal pregnancy, unspecified trimester: Secondary | ICD-10-CM

## 2019-07-07 ENCOUNTER — Other Ambulatory Visit: Payer: Medicaid Other

## 2019-07-09 ENCOUNTER — Other Ambulatory Visit: Payer: Self-pay | Admitting: Student

## 2019-07-09 ENCOUNTER — Encounter: Payer: Self-pay | Admitting: Student

## 2019-07-09 DIAGNOSIS — O361990 Maternal care for other isoimmunization, unspecified trimester, not applicable or unspecified: Secondary | ICD-10-CM | POA: Insufficient documentation

## 2019-07-09 DIAGNOSIS — R8271 Bacteriuria: Secondary | ICD-10-CM

## 2019-07-09 DIAGNOSIS — O99891 Other specified diseases and conditions complicating pregnancy: Secondary | ICD-10-CM

## 2019-07-09 HISTORY — DX: Other specified diseases and conditions complicating pregnancy: O99.891

## 2019-07-09 HISTORY — DX: Bacteriuria: R82.71

## 2019-07-09 MED ORDER — CEPHALEXIN 500 MG PO CAPS: 500.0000 mg | ORAL_CAPSULE | Freq: Four times a day (QID) | ORAL | 0 refills | Status: DC

## 2019-07-14 ENCOUNTER — Encounter: Payer: Self-pay | Admitting: *Deleted

## 2019-07-17 ENCOUNTER — Other Ambulatory Visit: Payer: Self-pay | Admitting: Student

## 2019-07-17 ENCOUNTER — Encounter: Payer: Self-pay | Admitting: Student

## 2019-07-17 DIAGNOSIS — R8271 Bacteriuria: Secondary | ICD-10-CM

## 2019-07-17 DIAGNOSIS — R7989 Other specified abnormal findings of blood chemistry: Secondary | ICD-10-CM

## 2019-07-17 DIAGNOSIS — O99891 Other specified diseases and conditions complicating pregnancy: Secondary | ICD-10-CM

## 2019-07-21 ENCOUNTER — Encounter: Payer: Self-pay | Admitting: *Deleted

## 2019-07-26 ENCOUNTER — Telehealth (INDEPENDENT_AMBULATORY_CARE_PROVIDER_SITE_OTHER): Payer: Medicaid Other | Admitting: Family Medicine

## 2019-07-26 ENCOUNTER — Other Ambulatory Visit: Payer: Self-pay

## 2019-07-26 DIAGNOSIS — Z3A16 16 weeks gestation of pregnancy: Secondary | ICD-10-CM | POA: Diagnosis not present

## 2019-07-26 DIAGNOSIS — O99210 Obesity complicating pregnancy, unspecified trimester: Secondary | ICD-10-CM

## 2019-07-26 DIAGNOSIS — O0992 Supervision of high risk pregnancy, unspecified, second trimester: Secondary | ICD-10-CM | POA: Diagnosis not present

## 2019-07-26 DIAGNOSIS — D219 Benign neoplasm of connective and other soft tissue, unspecified: Secondary | ICD-10-CM

## 2019-07-26 DIAGNOSIS — O99212 Obesity complicating pregnancy, second trimester: Secondary | ICD-10-CM | POA: Diagnosis not present

## 2019-07-26 DIAGNOSIS — D509 Iron deficiency anemia, unspecified: Secondary | ICD-10-CM

## 2019-07-26 MED ORDER — FERROUS GLUCONATE 324 (38 FE) MG PO TABS: 324.0000 mg | ORAL_TABLET | Freq: Two times a day (BID) | ORAL | 3 refills | Status: DC

## 2019-07-26 NOTE — Progress Notes (Signed)
   TELEHEALTH VIRTUAL OBSTETRICS VISIT ENCOUNTER NOTE  I connected with Sonia Allen on 07/26/19 at  2:15 PM EST by telephone at home and verified that I am speaking with the correct person using two identifiers.   I discussed the limitations, risks, security and privacy concerns of performing an evaluation and management service by telephone and the availability of in person appointments. I also discussed with the patient that there may be a patient responsible charge related to this service. The patient expressed understanding and agreed to proceed.  Subjective:  Sonia Allen is a 34 y.o. [id number] at [redacted]w[redacted]d being followed for ongoing prenatal care.  She is currently monitored for the following issues for this high-risk pregnancy and has Marijuana use; Supervision of high risk pregnancy, antepartum, second trimester; Fibroids; Tooth pain; Obesity in pregnancy; Proteinuria; Asymptomatic bacteriuria during pregnancy in first trimester; Maternal atypical antibody; and Low TSH level on their problem list.  Patient reports no complaints. Reports fetal movement. Denies any contractions, bleeding or leaking of fluid.   The following portions of the patient's history were reviewed and updated as appropriate: allergies, current medications, past family history, past medical history, past social history, past surgical history and problem list.   Objective:   General:  Alert, oriented and cooperative.   Mental Status: Normal mood and affect perceived. Normal judgment and thought content.  Rest of physical exam deferred due to type of encounter  Assessment and Plan:  Pregnancy: [id number] at [redacted]w[redacted]d 1. Supervision of high risk pregnancy, antepartum, second trimester FHT and FH normal  2. Fibroids Will monitor growth  3. Obesity in pregnancy  4. Iron deficiency anemia, unspecified iron deficiency anemia type Start iron  Preterm labor symptoms and general obstetric precautions including but not limited  to vaginal bleeding, contractions, leaking of fluid and fetal movement were reviewed in detail with the patient.  I discussed the assessment and treatment plan with the patient. The patient was provided an opportunity to ask questions and all were answered. The patient agreed with the plan and demonstrated an understanding of the instructions. The patient was advised to call back or seek an in-person office evaluation/go to [facility]U at [facility] for any urgent or concerning symptoms. Please refer to After Visit Summary for other counseling recommendations.   I provided 12 minutes of non-face-to-face time during this encounter.  Return in about 4 weeks (around 08/23/2019) for HR OB f/u.  Future Appointments  Date Time Provider [place]  08/20/2019 11:00 AM WOC-WOCA LAB WOC-WOCA WOC  08/20/2019  2:00 PM [place] NURSE [place] MFC-US  08/20/2019  2:00 PM WH-MFC [place] 3 WH-MFCUS MFC-US    Kateena Degroote J Montrail Mehrer, DO [facility], [place]

## 2019-07-26 NOTE — Progress Notes (Signed)
I connected with  [name] on 07/26/19 at 1422 by telephone and verified that I am speaking with the correct person using two identifiers.   I discussed the limitations, risks, security and privacy concerns of performing an evaluation and management service by telephone and the availability of in person appointments. I also discussed with the patient that there may be a patient responsible charge related to this service. The patient expressed understanding and agreed to proceed.  Pt reports headache 4 days ago. Denies any blurry vision or dizziness at that time or today.  [name], RN 07/26/2019  2:22 PM

## 2019-07-27 ENCOUNTER — Telehealth: Payer: Medicaid Other | Admitting: Student

## 2019-08-20 ENCOUNTER — Other Ambulatory Visit (HOSPITAL_COMMUNITY): Payer: Self-pay | Admitting: *Deleted

## 2019-08-20 ENCOUNTER — Ambulatory Visit (HOSPITAL_COMMUNITY)
Admission: RE | Admit: 2019-08-20 | Discharge: 2019-08-20 | Disposition: A | Discharge status: Home / Self Care | Payer: Medicaid Other | Source: Ambulatory Visit | Attending: Obstetrics and Gynecology | Admitting: Obstetrics and Gynecology

## 2019-08-20 ENCOUNTER — Other Ambulatory Visit: Payer: Medicaid Other

## 2019-08-20 ENCOUNTER — Ambulatory Visit (HOSPITAL_COMMUNITY): Payer: Medicaid Other | Admitting: *Deleted

## 2019-08-20 ENCOUNTER — Other Ambulatory Visit: Payer: Self-pay | Admitting: Student

## 2019-08-20 ENCOUNTER — Encounter (HOSPITAL_COMMUNITY): Payer: Self-pay

## 2019-08-20 ENCOUNTER — Other Ambulatory Visit: Payer: Self-pay

## 2019-08-20 DIAGNOSIS — K0889 Other specified disorders of teeth and supporting structures: Secondary | ICD-10-CM

## 2019-08-20 DIAGNOSIS — Z3A19 19 weeks gestation of pregnancy: Secondary | ICD-10-CM

## 2019-08-20 DIAGNOSIS — O3412 Maternal care for benign tumor of corpus uteri, second trimester: Secondary | ICD-10-CM

## 2019-08-20 DIAGNOSIS — O99210 Obesity complicating pregnancy, unspecified trimester: Secondary | ICD-10-CM | POA: Diagnosis present

## 2019-08-20 DIAGNOSIS — D219 Benign neoplasm of connective and other soft tissue, unspecified: Secondary | ICD-10-CM

## 2019-08-20 DIAGNOSIS — D259 Leiomyoma of uterus, unspecified: Secondary | ICD-10-CM

## 2019-08-20 DIAGNOSIS — O0992 Supervision of high risk pregnancy, unspecified, second trimester: Secondary | ICD-10-CM | POA: Insufficient documentation

## 2019-08-20 DIAGNOSIS — Z3490 Encounter for supervision of normal pregnancy, unspecified, unspecified trimester: Secondary | ICD-10-CM

## 2019-08-22 LAB — AFP, SERUM, OPEN SPINA BIFIDA
AFP MoM: 0.54
AFP Value: 30 ng/mL
Gest. Age on Collection Date: 19.9 weeks
Maternal Age At EDD: 31.8 yr
OSBR Risk 1 IN: 10000
Test Results:: NEGATIVE
Weight: 178 [lb_av]

## 2019-08-23 ENCOUNTER — Encounter: Payer: Self-pay | Admitting: Medical

## 2019-08-23 ENCOUNTER — Telehealth: Payer: Medicaid Other | Admitting: Family Medicine

## 2019-08-23 DIAGNOSIS — D259 Leiomyoma of uterus, unspecified: Secondary | ICD-10-CM | POA: Insufficient documentation

## 2019-08-24 ENCOUNTER — Telehealth: Payer: Medicaid Other | Admitting: Nurse Practitioner

## 2019-08-24 ENCOUNTER — Telehealth (INDEPENDENT_AMBULATORY_CARE_PROVIDER_SITE_OTHER): Payer: Medicaid Other | Admitting: Family Medicine

## 2019-08-24 ENCOUNTER — Other Ambulatory Visit: Payer: Self-pay

## 2019-08-24 DIAGNOSIS — O99212 Obesity complicating pregnancy, second trimester: Secondary | ICD-10-CM

## 2019-08-24 DIAGNOSIS — D259 Leiomyoma of uterus, unspecified: Secondary | ICD-10-CM

## 2019-08-24 DIAGNOSIS — O0992 Supervision of high risk pregnancy, unspecified, second trimester: Secondary | ICD-10-CM

## 2019-08-24 DIAGNOSIS — Z3A20 20 weeks gestation of pregnancy: Secondary | ICD-10-CM

## 2019-08-24 DIAGNOSIS — D219 Benign neoplasm of connective and other soft tissue, unspecified: Secondary | ICD-10-CM

## 2019-08-24 DIAGNOSIS — K0889 Other specified disorders of teeth and supporting structures: Secondary | ICD-10-CM

## 2019-08-24 DIAGNOSIS — O3412 Maternal care for benign tumor of corpus uteri, second trimester: Secondary | ICD-10-CM

## 2019-08-24 DIAGNOSIS — O99210 Obesity complicating pregnancy, unspecified trimester: Secondary | ICD-10-CM

## 2019-08-24 NOTE — Patient Instructions (Signed)

## 2019-08-24 NOTE — Progress Notes (Signed)
TELEHEALTH OBSTETRICS PRENATAL VIRTUAL VIDEO VISIT ENCOUNTER NOTE  Provider location: [facility] [place] [facility] [place]   I connected with Sonia Allen on 08/24/19 at 10:35 AM EST by MyChart Video Encounter at home and verified that I am speaking with the correct person using two identifiers.   I discussed the limitations, risks, security and privacy concerns of performing an evaluation and management service virtually and the availability of in person appointments. I also discussed with the patient that there may be a patient responsible charge related to this service. The patient expressed understanding and agreed to proceed. Subjective:  Sonia Allen is a 34 y.o. [id number] at [redacted]w[redacted]d being seen today for ongoing prenatal care.  She is currently monitored for the following issues for this low-risk pregnancy and has Marijuana use; Supervision of high risk pregnancy, antepartum, second trimester; Fibroids; Tooth pain; Obesity in pregnancy; Proteinuria; Asymptomatic bacteriuria during pregnancy in first trimester; Maternal atypical antibody; Low TSH level; and Uterine fibroid complicating antenatal care, baby not yet delivered in second trimester on their problem list.  Patient reports no complaints.  Contractions: Not present. Vag. Bleeding: None.  Movement: Present. Denies any leaking of fluid.   The following portions of the patient's history were reviewed and updated as appropriate: allergies, current medications, past family history, past medical history, past social history, past surgical history and problem list.   Objective:   Vitals:   08/24/19 1047  BP: 116/73  Pulse: 81    Fetal Status:     Movement: Present     General:  Alert, oriented and cooperative. Patient is in no acute distress.  Respiratory: Normal respiratory effort, no problems with respiration noted  Mental Status: Normal mood and affect. Normal behavior. Normal judgment and thought content.  Rest of physical exam  deferred due to type of encounter  Imaging: [place] MFM OB DETAIL +14 WK  Result Date: 08/20/2019 ----------------------------------------------------------------------  OBSTETRICS REPORT                       (Signed Final 08/20/2019 03:47 pm) ---------------------------------------------------------------------- Patient Info  ID #:       [id number]                          D.O.B.:  1985-09-11 (33 yrs)  Name:       Sonia Allen                    Visit Date: 08/20/2019 02:07 pm ---------------------------------------------------------------------- Performed By  Performed By:     Hubert Azure          Ref. Address:     [address]                                                             [address]  Attending:        Johnell Comings MD         [facility]  Referred By:      [facility] ---------------------------------------------------------------------- Orders   #  Description                          Code         Ordered By   1  [place] MFM OB DETAIL +14 WK              [id number]     Kerry Hough  ----------------------------------------------------------------------   #  Order #                    Accession #                 Episode #   1  [id number]                  [id number]                  [id number]  ---------------------------------------------------------------------- Indications   Uterine fibroids affecting pregnancy in        O34.12, D25.9   second trimester, antepartum   [redacted] weeks gestation of pregnancy                Z3A.19   Encounter for antenatal screening for          Z36.3   malformations (low risk Nips, neg Horizon)   Marijuana Use   Poor obstetric history: Previous gestational   O09.299   diabetes   Poor obstetric history: Previous               O09.299   preeclampsia / eclampsia/gestational HTN   Obesity complicating pregnancy, second         O99.212   trimester (pre-pregnancy BMI=30.54)   ---------------------------------------------------------------------- Fetal Evaluation  Num Of Fetuses:         1  Fetal Heart Rate(bpm):  144  Cardiac Activity:       Observed  Presentation:           Cephalic  Placenta:               Anterior  P. Cord Insertion:      Visualized, central  Amniotic Fluid  AFI FV:      Within normal limits                              Largest Pocket(cm)                              3.77 ---------------------------------------------------------------------- Biometry  BPD:      45.9  mm     G. Age:  19w 6d         51  %    CI:        71.18   %    70 - 86                                                          FL/HC:      17.5   %    16.8 - 19.8  HC:      173.3  mm     G.  Age:  19w 6d         23  %    HC/AC:      1.11        1.09 - 1.39  AC:      156.7  mm     G. Age:  20w 6d         76  %    FL/BPD:     66.0   %  FL:       30.3  mm     G. Age:  19w 3d         25  %    FL/AC:      19.3   %    20 - 24  HUM:        31  mm     G. Age:  20w 2d         64  %  CER:      20.2  mm     G. Age:  19w 1d         36  %  NFT:       4.9  mm  LV:        7.8  mm  CM:          6  mm  Est. FW:     333  gm    0 lb 12 oz      61  % ---------------------------------------------------------------------- OB History  Gravidity:    9         Term:   5         SAB:   1  TOP:          2 ---------------------------------------------------------------------- Gestational Age  LMP:           19w 6d        Date:  04/03/19                 EDD:   01/08/20  U/S Today:     20w 0d                                        EDD:   01/07/20  Best:          19w 6d     Det. By:  LMP  (04/03/19)          EDD:   01/08/20 ---------------------------------------------------------------------- Anatomy  Cranium:               Appears normal         LVOT:                   Appears normal  Cavum:                 Appears normal         Aortic Arch:            Appears normal  Ventricles:            Appears normal         Ductal Arch:             Appears normal  Choroid Plexus:        Appears normal         Diaphragm:              Appears normal  Cerebellum:  Appears normal         Stomach:                Appears normal, left                                                                        sided  Posterior Fossa:       Appears normal         Abdomen:                Appears normal  Nuchal Fold:           Appears normal         Abdominal Wall:         Appears nml (cord                                                                        insert, abd wall)  Face:                  Appears normal         Cord Vessels:           Appears normal (3                         (orbits and profile)                           vessel cord)  Lips:                  Appears normal         Kidneys:                Appear normal  Palate:                Appears normal         Bladder:                Appears normal  Thoracic:              Appears normal         Spine:                  Appears normal  Heart:                 Appears normal         Upper Extremities:      Appears normal                         (4CH, axis, and                         situs)  RVOT:                  Appears normal  Lower Extremities:      Appears normal  Other:  Fetus appears to be female. Nasal bone visualized. Heels and LT 5th          digit visualized. ---------------------------------------------------------------------- Cervix Uterus Adnexa  Cervix  Length:           3.48  cm.  Normal appearance by transabdominal scan.  Uterus  Single fibroid noted, see table below.  Left Ovary  Not visualized.  Right Ovary  Within normal limits.  Adnexa  No abnormality visualized. ---------------------------------------------------------------------- Myomas   Site                     L(cm)      W(cm)      D(cm)      Location   LUS                      4.3        3.7        4  ----------------------------------------------------------------------   Blood Flow                 RI        PI        Comments  ---------------------------------------------------------------------- Comments  This patient was seen for a detailed fetal anatomy scan. She  denies any significant past medical history and denies any  problems in her current pregnancy.  She had a cell free DNA test earlier in her pregnancy which  indicated a low risk for trisomy 66, 1, and 13. A female fetus  is predicted.  She was informed that the fetal growth and amniotic fluid  level were appropriate for her gestational age.  There were no obvious fetal anomalies noted on today's  ultrasound exam.  The patient was informed that anomalies may be missed due  to technical limitations. If the fetus is in a suboptimal position  or maternal habitus is increased, visualization of the fetus in  the maternal uterus may be impaired.  A 4 cm fibroid was noted in the lower uterine segment.  The  increased risk of maternal pain issues and fetal growth issues  later in her pregnancy due to the fibroids was discussed.  A follow-up growth scan was scheduled at around 28 weeks  to assess the fetal growth. ----------------------------------------------------------------------                   Johnell Comings, MD Electronically Signed Final Report   08/20/2019 03:47 pm ----------------------------------------------------------------------   Assessment and Plan:  Pregnancy: [id number] at [redacted]w[redacted]d 1. Uterine fibroid complicating antenatal care, baby not yet delivered in second trimester 4 cm--f/u at next growth scan, usual pregnancy issues discussed.  2. Supervision of high risk pregnancy, antepartum, second trimester Continue routine prenatal care.  Preterm labor symptoms and general obstetric precautions including but not limited to vaginal bleeding, contractions, leaking of fluid and fetal movement were reviewed in detail with the patient. I discussed the assessment and treatment plan with the patient. The patient was provided an opportunity to ask questions  and all were answered. The patient agreed with the plan and demonstrated an understanding of the instructions. The patient was advised to call back or seek an in-person office evaluation/go to MAU at [facility] for any urgent or concerning symptoms. Please refer to After Visit Summary for other counseling recommendations.   I provided 11 minutes of face-to-face time during this encounter.  Return in 4 weeks (on  09/21/2019).  Future Appointments  Date Time Provider [place]  09/21/2019  9:15 AM Chancy Milroy, MD WOC-WOCA [place]  10/25/2019 11:00 AM [place] NURSE [place] MFC-US  10/25/2019 11:00 AM WH-MFC [place] 3 WH-MFCUS MFC-US    Tressa Maldonado S [place], [place] for [facility], [place]

## 2019-08-24 NOTE — Progress Notes (Signed)
I connected with  Sonia Allen on 08/24/19 at 10:35 AM EST by telephone and verified that I am speaking with the correct person using two identifiers.   I discussed the limitations, risks, security and privacy concerns of performing an evaluation and management service by telephone and the availability of in person appointments. I also discussed with the patient that there may be a patient responsible charge related to this service. The patient expressed understanding and agreed to proceed.  Sonia Carmine, RN 08/24/2019  10:40 AM

## 2019-09-21 ENCOUNTER — Encounter: Payer: Self-pay | Admitting: Obstetrics and Gynecology

## 2019-09-21 ENCOUNTER — Other Ambulatory Visit: Payer: Self-pay

## 2019-09-21 ENCOUNTER — Telehealth (INDEPENDENT_AMBULATORY_CARE_PROVIDER_SITE_OTHER): Payer: Medicaid Other | Admitting: Obstetrics and Gynecology

## 2019-09-21 ENCOUNTER — Telehealth: Payer: Self-pay | Admitting: Obstetrics and Gynecology

## 2019-09-21 DIAGNOSIS — O0992 Supervision of high risk pregnancy, unspecified, second trimester: Secondary | ICD-10-CM

## 2019-09-21 DIAGNOSIS — Z5329 Procedure and treatment not carried out because of patient's decision for other reasons: Secondary | ICD-10-CM

## 2019-09-21 NOTE — Telephone Encounter (Signed)
Attempted to contact patient to get her rescheduled for her missed ob appointment. No answer, left voicemail for patient to give the office a call back to be rescheduled. No show letter mailed and mychart message sent

## 2019-09-21 NOTE — Progress Notes (Signed)
9:06a- Called pt for My Chart visit, no answer, left VM that will retry in 10 to 15 minutes.    9:21a- 2nd attempt,no answer. Left VM that some one will be calling to reschedule appointment.

## 2019-09-23 ENCOUNTER — Encounter: Payer: Self-pay | Admitting: Obstetrics and Gynecology

## 2019-10-25 ENCOUNTER — Ambulatory Visit (HOSPITAL_COMMUNITY): Payer: Medicaid Other | Admitting: *Deleted

## 2019-10-25 ENCOUNTER — Ambulatory Visit (HOSPITAL_COMMUNITY)
Admission: RE | Admit: 2019-10-25 | Discharge: 2019-10-25 | Disposition: A | Discharge status: Home / Self Care | Payer: Medicaid Other | Source: Ambulatory Visit | Attending: Obstetrics and Gynecology | Admitting: Obstetrics and Gynecology

## 2019-10-25 ENCOUNTER — Other Ambulatory Visit: Payer: Self-pay

## 2019-10-25 ENCOUNTER — Other Ambulatory Visit (HOSPITAL_COMMUNITY): Payer: Self-pay | Admitting: *Deleted

## 2019-10-25 ENCOUNTER — Encounter (HOSPITAL_COMMUNITY): Payer: Self-pay

## 2019-10-25 DIAGNOSIS — O3410 Maternal care for benign tumor of corpus uteri, unspecified trimester: Secondary | ICD-10-CM | POA: Insufficient documentation

## 2019-10-25 DIAGNOSIS — K0889 Other specified disorders of teeth and supporting structures: Secondary | ICD-10-CM | POA: Diagnosis present

## 2019-10-25 DIAGNOSIS — O99210 Obesity complicating pregnancy, unspecified trimester: Secondary | ICD-10-CM

## 2019-10-25 DIAGNOSIS — O3412 Maternal care for benign tumor of corpus uteri, second trimester: Secondary | ICD-10-CM | POA: Diagnosis present

## 2019-10-25 DIAGNOSIS — O0992 Supervision of high risk pregnancy, unspecified, second trimester: Secondary | ICD-10-CM

## 2019-10-25 DIAGNOSIS — D259 Leiomyoma of uterus, unspecified: Secondary | ICD-10-CM

## 2019-10-25 DIAGNOSIS — E669 Obesity, unspecified: Secondary | ICD-10-CM

## 2019-10-25 DIAGNOSIS — O09293 Supervision of pregnancy with other poor reproductive or obstetric history, third trimester: Secondary | ICD-10-CM

## 2019-10-25 DIAGNOSIS — Z362 Encounter for other antenatal screening follow-up: Secondary | ICD-10-CM

## 2019-10-25 DIAGNOSIS — D219 Benign neoplasm of connective and other soft tissue, unspecified: Secondary | ICD-10-CM

## 2019-10-25 DIAGNOSIS — O99213 Obesity complicating pregnancy, third trimester: Secondary | ICD-10-CM

## 2019-10-25 DIAGNOSIS — O3413 Maternal care for benign tumor of corpus uteri, third trimester: Secondary | ICD-10-CM

## 2019-10-25 DIAGNOSIS — Z3A29 29 weeks gestation of pregnancy: Secondary | ICD-10-CM

## 2019-11-15 ENCOUNTER — Other Ambulatory Visit: Payer: Self-pay

## 2019-11-15 ENCOUNTER — Ambulatory Visit (INDEPENDENT_AMBULATORY_CARE_PROVIDER_SITE_OTHER): Payer: Medicaid Other | Admitting: Family Medicine

## 2019-11-15 VITALS — BP 118/69 | HR 97 | Wt 179.1 lb

## 2019-11-15 DIAGNOSIS — R7989 Other specified abnormal findings of blood chemistry: Secondary | ICD-10-CM

## 2019-11-15 DIAGNOSIS — O99891 Other specified diseases and conditions complicating pregnancy: Secondary | ICD-10-CM

## 2019-11-15 DIAGNOSIS — Z3A32 32 weeks gestation of pregnancy: Secondary | ICD-10-CM

## 2019-11-15 DIAGNOSIS — D259 Leiomyoma of uterus, unspecified: Secondary | ICD-10-CM

## 2019-11-15 DIAGNOSIS — O361930 Maternal care for other isoimmunization, third trimester, not applicable or unspecified: Secondary | ICD-10-CM

## 2019-11-15 DIAGNOSIS — O0992 Supervision of high risk pregnancy, unspecified, second trimester: Secondary | ICD-10-CM

## 2019-11-15 DIAGNOSIS — R8271 Bacteriuria: Secondary | ICD-10-CM

## 2019-11-15 DIAGNOSIS — O3413 Maternal care for benign tumor of corpus uteri, third trimester: Secondary | ICD-10-CM

## 2019-11-15 NOTE — Progress Notes (Signed)
   PRENATAL VISIT NOTE  Subjective:  Sonia Allen is a 34 y.o. [phone] at [redacted]w[redacted]d being seen today for ongoing prenatal care.  She is currently monitored for the following issues for this high-risk pregnancy and has Marijuana use; Supervision of high risk pregnancy, antepartum, second trimester; Fibroids; Tooth pain; Obesity in pregnancy; Proteinuria; Asymptomatic bacteriuria during pregnancy in first trimester; Maternal atypical antibody; Low TSH level; and Uterine fibroid complicating antenatal care, baby not yet delivered in second trimester on their problem list.  Patient reports no complaints.  Contractions: Not present. Vag. Bleeding: None.  Movement: Present. Denies leaking of fluid.   The following portions of the patient's history were reviewed and updated as appropriate: allergies, current medications, past family history, past medical history, past social history, past surgical history and problem list.   Objective:   Vitals:   11/15/19 1104  BP: 118/69  Pulse: 97  Weight: 179 lb 1.6 oz (81.2 kg)    Fetal Status: Fetal Heart Rate (bpm): 147   Movement: Present     General:  Alert, oriented and cooperative. Patient is in no acute distress.  Skin: Skin is warm and dry. No rash noted.   Cardiovascular: Normal heart rate noted  Respiratory: Normal respiratory effort, no problems with respiration noted  Abdomen: Soft, gravid, appropriate for gestational age.  Pain/Pressure: Present     Pelvic: Cervical exam deferred        Extremities: Normal range of motion.  Edema: None  Mental Status: Normal mood and affect. Normal behavior. Normal judgment and thought content.   Assessment and Plan:  Pregnancy: [id number] at [redacted]w[redacted]d 1. Supervision of high risk pregnancy, antepartum, second trimester FHT and FH normal  2. Low TSH level Check TSH, T3/T4 today.  3. Asymptomatic bacteriuria during pregnancy in first trimester Check urine culture today  4. Uterine fibroid complicating antenatal  care, baby not yet delivered in second trimester Serial growth [place]  5. Maternal atypical antibody affecting pregnancy in third trimester, single or unspecified fetus M antibody. Not clinically significant.  Preterm labor symptoms and general obstetric precautions including but not limited to vaginal bleeding, contractions, leaking of fluid and fetal movement were reviewed in detail with the patient. Please refer to After Visit Summary for other counseling recommendations.   No follow-ups on file.  Future Appointments  Date Time Provider [place]  11/23/2019 11:15 AM [facility] NURSE [facility] [place] [facility]  11/23/2019 11:15 AM [facility]-M[facility] US2 W[facility]-MFCUS [place], DO

## 2019-11-16 LAB — TSH: TSH: 0.837 u[IU]/mL (ref 0.450–4.500)

## 2019-11-16 LAB — T4: T4, Total: 10.7 ug/dL (ref 4.5–12.0)

## 2019-11-18 ENCOUNTER — Other Ambulatory Visit: Payer: Self-pay | Admitting: Family Medicine

## 2019-11-18 LAB — URINE CULTURE, OB REFLEX

## 2019-11-18 LAB — CULTURE, OB URINE

## 2019-11-18 MED ORDER — CEFADROXIL 500 MG PO CAPS: 500.0000 mg | ORAL_CAPSULE | Freq: Two times a day (BID) | ORAL | 0 refills | Status: DC

## 2019-11-23 ENCOUNTER — Other Ambulatory Visit: Payer: Self-pay

## 2019-11-23 ENCOUNTER — Ambulatory Visit: Payer: Medicaid Other | Admitting: *Deleted

## 2019-11-23 ENCOUNTER — Ambulatory Visit (HOSPITAL_COMMUNITY): Payer: Medicaid Other | Attending: Obstetrics and Gynecology

## 2019-11-23 DIAGNOSIS — O99213 Obesity complicating pregnancy, third trimester: Secondary | ICD-10-CM

## 2019-11-23 DIAGNOSIS — O3412 Maternal care for benign tumor of corpus uteri, second trimester: Secondary | ICD-10-CM | POA: Diagnosis present

## 2019-11-23 DIAGNOSIS — O99210 Obesity complicating pregnancy, unspecified trimester: Secondary | ICD-10-CM | POA: Diagnosis present

## 2019-11-23 DIAGNOSIS — E669 Obesity, unspecified: Secondary | ICD-10-CM

## 2019-11-23 DIAGNOSIS — Z362 Encounter for other antenatal screening follow-up: Secondary | ICD-10-CM | POA: Diagnosis not present

## 2019-11-23 DIAGNOSIS — O09293 Supervision of pregnancy with other poor reproductive or obstetric history, third trimester: Secondary | ICD-10-CM

## 2019-11-23 DIAGNOSIS — O0992 Supervision of high risk pregnancy, unspecified, second trimester: Secondary | ICD-10-CM | POA: Diagnosis present

## 2019-11-23 DIAGNOSIS — D219 Benign neoplasm of connective and other soft tissue, unspecified: Secondary | ICD-10-CM | POA: Insufficient documentation

## 2019-11-23 DIAGNOSIS — O3410 Maternal care for benign tumor of corpus uteri, unspecified trimester: Secondary | ICD-10-CM | POA: Insufficient documentation

## 2019-11-23 DIAGNOSIS — D259 Leiomyoma of uterus, unspecified: Secondary | ICD-10-CM

## 2019-11-23 DIAGNOSIS — O3413 Maternal care for benign tumor of corpus uteri, third trimester: Secondary | ICD-10-CM | POA: Diagnosis not present

## 2019-11-23 DIAGNOSIS — K0889 Other specified disorders of teeth and supporting structures: Secondary | ICD-10-CM | POA: Insufficient documentation

## 2019-11-23 DIAGNOSIS — Z3A33 33 weeks gestation of pregnancy: Secondary | ICD-10-CM

## 2019-12-06 ENCOUNTER — Encounter: Payer: Medicaid Other | Admitting: Obstetrics & Gynecology

## 2019-12-13 MED ORDER — FLUTICASONE PROPIONATE 50 MCG/ACT NA SUSP: 1.0000 | NASAL | 0 refills | Status: DC | PRN

## 2019-12-20 ENCOUNTER — Ambulatory Visit (INDEPENDENT_AMBULATORY_CARE_PROVIDER_SITE_OTHER): Payer: Medicaid Other | Admitting: Family Medicine

## 2019-12-20 ENCOUNTER — Other Ambulatory Visit (HOSPITAL_COMMUNITY)
Admission: RE | Admit: 2019-12-20 | Discharge: 2019-12-20 | Disposition: A | Discharge status: Home / Self Care | Payer: Medicaid Other | Source: Ambulatory Visit | Attending: Family Medicine | Admitting: Family Medicine

## 2019-12-20 ENCOUNTER — Other Ambulatory Visit: Payer: Self-pay

## 2019-12-20 VITALS — BP 123/76 | HR 83 | Wt 180.0 lb

## 2019-12-20 DIAGNOSIS — O3413 Maternal care for benign tumor of corpus uteri, third trimester: Secondary | ICD-10-CM

## 2019-12-20 DIAGNOSIS — O0993 Supervision of high risk pregnancy, unspecified, third trimester: Secondary | ICD-10-CM

## 2019-12-20 DIAGNOSIS — O0992 Supervision of high risk pregnancy, unspecified, second trimester: Secondary | ICD-10-CM | POA: Insufficient documentation

## 2019-12-20 DIAGNOSIS — D259 Leiomyoma of uterus, unspecified: Secondary | ICD-10-CM

## 2019-12-20 DIAGNOSIS — R7989 Other specified abnormal findings of blood chemistry: Secondary | ICD-10-CM

## 2019-12-20 DIAGNOSIS — Z3A37 37 weeks gestation of pregnancy: Secondary | ICD-10-CM

## 2019-12-20 DIAGNOSIS — O361930 Maternal care for other isoimmunization, third trimester, not applicable or unspecified: Secondary | ICD-10-CM

## 2019-12-20 NOTE — Progress Notes (Signed)
   PRENATAL VISIT NOTE  Subjective:  Sonia Allen is a 34 y.o. [phone] at [redacted]w[redacted]d being seen today for ongoing prenatal care.  She is currently monitored for the following issues for this low-risk pregnancy and has Marijuana use; Supervision of high risk pregnancy, antepartum, second trimester; Fibroids; Tooth pain; Obesity in pregnancy; Proteinuria; Asymptomatic bacteriuria during pregnancy in first trimester; Maternal atypical antibody; Low TSH level; and Uterine fibroid complicating antenatal care, baby not yet delivered in second trimester on their problem list.  Patient reports occasional contractions.  Contractions: Not present. Vag. Bleeding: None.  Movement: Present. Denies leaking of fluid.   The following portions of the patient's history were reviewed and updated as appropriate: allergies, current medications, past family history, past medical history, past social history, past surgical history and problem list.   Objective:   Vitals:   12/20/19 1302  BP: 123/76  Pulse: 83  Weight: 180 lb (81.6 kg)    Fetal Status: Fetal Heart Rate (bpm): 148 Fundal Height: 37 cm Movement: Present  Presentation: Vertex  General:  Alert, oriented and cooperative. Patient is in no acute distress.  Skin: Skin is warm and dry. No rash noted.   Cardiovascular: Normal heart rate noted  Respiratory: Normal respiratory effort, no problems with respiration noted  Abdomen: Soft, gravid, appropriate for gestational age.  Pain/Pressure: Present     Pelvic: Cervical exam performed in the presence of a chaperone Dilation: Fingertip Effacement (%): Thick    Extremities: Normal range of motion.  Edema: None  Mental Status: Normal mood and affect. Normal behavior. Normal judgment and thought content.   Assessment and Plan:  Pregnancy: [id number] at [redacted]w[redacted]d 1. Supervision of high risk pregnancy, antepartum, second trimester FHT and FH normal - Culture, beta strep (group b only) - GC/Chlamydia probe amp ([facility])not at [facility]  2. Low TSH level  3. Maternal atypical antibody affecting pregnancy in third trimester, single or unspecified fetus Not clinically active  4. Uterine fibroid complicating antenatal care, baby not yet delivered in second trimester Good fetal growht  Term labor symptoms and general obstetric precautions including but not limited to vaginal bleeding, contractions, leaking of fluid and fetal movement were reviewed in detail with the patient. Please refer to After Visit Summary for other counseling recommendations.   No follow-ups on file.  No future appointments.  Truett Mainland, DO

## 2019-12-21 ENCOUNTER — Encounter: Payer: Medicaid Other | Admitting: Women's Health

## 2019-12-21 LAB — GC/CHLAMYDIA PROBE AMP (~~LOC~~) NOT AT ARMC
Chlamydia: NEGATIVE
Comment: NEGATIVE
Comment: NORMAL
Neisseria Gonorrhea: NEGATIVE

## 2019-12-23 LAB — CULTURE, BETA STREP (GROUP B ONLY): Strep Gp B Culture: POSITIVE — AB

## 2019-12-29 ENCOUNTER — Encounter: Payer: Medicaid Other | Admitting: Family Medicine

## 2019-12-29 ENCOUNTER — Encounter: Payer: Self-pay | Admitting: Family Medicine

## 2019-12-29 NOTE — Progress Notes (Signed)
Patient did not keep appointment today. She will be called to reschedule.  

## 2020-01-05 ENCOUNTER — Ambulatory Visit (INDEPENDENT_AMBULATORY_CARE_PROVIDER_SITE_OTHER): Payer: Medicaid Other | Admitting: Obstetrics & Gynecology

## 2020-01-05 ENCOUNTER — Other Ambulatory Visit: Payer: Self-pay

## 2020-01-05 VITALS — BP 120/83 | HR 98 | Wt 183.8 lb

## 2020-01-05 DIAGNOSIS — Z3A39 39 weeks gestation of pregnancy: Secondary | ICD-10-CM

## 2020-01-05 DIAGNOSIS — O0992 Supervision of high risk pregnancy, unspecified, second trimester: Secondary | ICD-10-CM

## 2020-01-05 DIAGNOSIS — D219 Benign neoplasm of connective and other soft tissue, unspecified: Secondary | ICD-10-CM

## 2020-01-05 DIAGNOSIS — O3413 Maternal care for benign tumor of corpus uteri, third trimester: Secondary | ICD-10-CM

## 2020-01-05 DIAGNOSIS — O2413 Pre-existing diabetes mellitus, type 2, in the puerperium: Secondary | ICD-10-CM

## 2020-01-05 DIAGNOSIS — O99323 Drug use complicating pregnancy, third trimester: Secondary | ICD-10-CM

## 2020-01-05 DIAGNOSIS — F1290 Cannabis use, unspecified, uncomplicated: Secondary | ICD-10-CM

## 2020-01-05 DIAGNOSIS — E669 Obesity, unspecified: Secondary | ICD-10-CM

## 2020-01-05 DIAGNOSIS — O99213 Obesity complicating pregnancy, third trimester: Secondary | ICD-10-CM

## 2020-01-05 DIAGNOSIS — O0993 Supervision of high risk pregnancy, unspecified, third trimester: Secondary | ICD-10-CM

## 2020-01-05 NOTE — Patient Instructions (Signed)

## 2020-01-05 NOTE — Progress Notes (Signed)
   PRENATAL VISIT NOTE  Subjective:  Sonia Allen is a 34 y.o. [id number] at [redacted]w[redacted]d being seen today for ongoing prenatal care.  She is currently monitored for the following issues for this low-risk pregnancy and has Marijuana use; Supervision of high risk pregnancy, antepartum, second trimester; Fibroids; Tooth pain; Obesity in pregnancy; Proteinuria; Asymptomatic bacteriuria during pregnancy in first trimester; Maternal atypical antibody; Low TSH level; and Uterine fibroid complicating antenatal care, baby not yet delivered in second trimester on their problem list.  Patient reports no complaints.  Contractions: Irritability. Vag. Bleeding: None.  Movement: Present. Denies leaking of fluid.   The following portions of the patient's history were reviewed and updated as appropriate: allergies, current medications, past family history, past medical history, past social history, past surgical history and problem list.   Objective:   Vitals:   01/05/20 1452  BP: 120/83  Pulse: 98  Weight: 183 lb 12.8 oz (83.4 kg)    Fetal Status: Fetal Heart Rate (bpm): 143 Fundal Height: 37 cm Movement: Present     General:  Alert, oriented and cooperative. Patient is in no acute distress.  Skin: Skin is warm and dry. No rash noted.   Cardiovascular: Normal heart rate noted  Respiratory: Normal respiratory effort, no problems with respiration noted  Abdomen: Soft, gravid, appropriate for gestational age.  Pain/Pressure: Present     Pelvic: Cervical exam deferred        Extremities: Normal range of motion.  Edema: None  Mental Status: Normal mood and affect. Normal behavior. Normal judgment and thought content.   Assessment and Plan:  Pregnancy: [id number] at [redacted]w[redacted]d 1. Supervision of high risk pregnancy, antepartum, second trimester Doing well  2. Fibroids   Term labor symptoms and general obstetric precautions including but not limited to vaginal bleeding, contractions, leaking of fluid and fetal movement  were reviewed in detail with the patient. Please refer to After Visit Summary for other counseling recommendations.   Return in about 1 week (around 01/12/2020).  No future appointments.  Emeterio Reeve, MD

## 2020-01-09 ENCOUNTER — Encounter (HOSPITAL_COMMUNITY): Payer: Self-pay | Admitting: Obstetrics & Gynecology

## 2020-01-09 ENCOUNTER — Other Ambulatory Visit: Payer: Self-pay

## 2020-01-09 ENCOUNTER — Inpatient Hospital Stay (HOSPITAL_COMMUNITY)
Admission: AD | Admit: 2020-01-09 | Discharge: 2020-01-11 | DRG: 807 | Disposition: A | Discharge status: Home / Self Care | Payer: Medicaid Other | Attending: Obstetrics and Gynecology | Admitting: Obstetrics and Gynecology

## 2020-01-09 DIAGNOSIS — O3413 Maternal care for benign tumor of corpus uteri, third trimester: Secondary | ICD-10-CM | POA: Diagnosis present

## 2020-01-09 DIAGNOSIS — O480 Post-term pregnancy: Secondary | ICD-10-CM | POA: Diagnosis present

## 2020-01-09 DIAGNOSIS — Z20822 Contact with and (suspected) exposure to covid-19: Secondary | ICD-10-CM | POA: Diagnosis present

## 2020-01-09 DIAGNOSIS — K0889 Other specified disorders of teeth and supporting structures: Secondary | ICD-10-CM

## 2020-01-09 DIAGNOSIS — D259 Leiomyoma of uterus, unspecified: Secondary | ICD-10-CM | POA: Diagnosis present

## 2020-01-09 DIAGNOSIS — Z3A40 40 weeks gestation of pregnancy: Secondary | ICD-10-CM | POA: Diagnosis not present

## 2020-01-09 DIAGNOSIS — O361990 Maternal care for other isoimmunization, unspecified trimester, not applicable or unspecified: Secondary | ICD-10-CM | POA: Diagnosis present

## 2020-01-09 DIAGNOSIS — Z3490 Encounter for supervision of normal pregnancy, unspecified, unspecified trimester: Secondary | ICD-10-CM | POA: Diagnosis present

## 2020-01-09 DIAGNOSIS — O99824 Streptococcus B carrier state complicating childbirth: Secondary | ICD-10-CM | POA: Diagnosis present

## 2020-01-09 DIAGNOSIS — O99210 Obesity complicating pregnancy, unspecified trimester: Secondary | ICD-10-CM | POA: Diagnosis present

## 2020-01-09 DIAGNOSIS — O134 Gestational [pregnancy-induced] hypertension without significant proteinuria, complicating childbirth: Secondary | ICD-10-CM | POA: Diagnosis present

## 2020-01-09 DIAGNOSIS — R030 Elevated blood-pressure reading, without diagnosis of hypertension: Secondary | ICD-10-CM | POA: Diagnosis present

## 2020-01-09 DIAGNOSIS — F1290 Cannabis use, unspecified, uncomplicated: Secondary | ICD-10-CM | POA: Diagnosis present

## 2020-01-09 DIAGNOSIS — O99214 Obesity complicating childbirth: Secondary | ICD-10-CM | POA: Diagnosis present

## 2020-01-09 DIAGNOSIS — O0992 Supervision of high risk pregnancy, unspecified, second trimester: Secondary | ICD-10-CM

## 2020-01-09 DIAGNOSIS — R809 Proteinuria, unspecified: Secondary | ICD-10-CM | POA: Diagnosis present

## 2020-01-09 DIAGNOSIS — D219 Benign neoplasm of connective and other soft tissue, unspecified: Secondary | ICD-10-CM

## 2020-01-09 DIAGNOSIS — E669 Obesity, unspecified: Secondary | ICD-10-CM | POA: Diagnosis present

## 2020-01-09 DIAGNOSIS — O3412 Maternal care for benign tumor of corpus uteri, second trimester: Secondary | ICD-10-CM

## 2020-01-09 DIAGNOSIS — O139 Gestational [pregnancy-induced] hypertension without significant proteinuria, unspecified trimester: Secondary | ICD-10-CM | POA: Diagnosis not present

## 2020-01-09 LAB — CBC
HCT: 30.2 % — ABNORMAL LOW (ref 36.0–46.0)
Hemoglobin: 9.7 g/dL — ABNORMAL LOW (ref 12.0–15.0)
MCH: 27.1 pg (ref 26.0–34.0)
MCHC: 32.1 g/dL (ref 30.0–36.0)
MCV: 84.4 fL (ref 80.0–100.0)
Platelets: 299 10*3/uL (ref 150–400)
RBC: 3.58 MIL/uL — ABNORMAL LOW (ref 3.87–5.11)
RDW: 14.6 % (ref 11.5–15.5)
WBC: 7.8 10*3/uL (ref 4.0–10.5)
nRBC: 0 % (ref 0.0–0.2)

## 2020-01-09 LAB — TYPE AND SCREEN
ABO/RH(D): B POS
Antibody Screen: NEGATIVE

## 2020-01-09 LAB — COMPREHENSIVE METABOLIC PANEL
ALT: 11 U/L (ref 0–44)
AST: 15 U/L (ref 15–41)
Albumin: 3 g/dL — ABNORMAL LOW (ref 3.5–5.0)
Alkaline Phosphatase: 89 U/L (ref 38–126)
Anion gap: 9 (ref 5–15)
BUN: 7 mg/dL (ref 6–20)
CO2: 19 mmol/L — ABNORMAL LOW (ref 22–32)
Calcium: 9.3 mg/dL (ref 8.9–10.3)
Chloride: 106 mmol/L (ref 98–111)
Creatinine, Ser: 0.39 mg/dL — ABNORMAL LOW (ref 0.44–1.00)
GFR calc Af Amer: >60 mL/min (ref >60)
GFR calc non Af Amer: >60 mL/min (ref >60)
Glucose, Bld: 83 mg/dL (ref 70–99)
Potassium: 3.8 mmol/L (ref 3.5–5.1)
Sodium: 134 mmol/L — ABNORMAL LOW (ref 135–145)
Total Bilirubin: 0.4 mg/dL (ref 0.3–1.2)
Total Protein: 6.5 g/dL (ref 6.5–8.1)

## 2020-01-09 LAB — PROTEIN / CREATININE RATIO, URINE
Creatinine, Urine: 105.28 mg/dL
Protein Creatinine Ratio: 0.08 mg/mg{Cre} (ref 0.00–0.15)
Total Protein, Urine: 8 mg/dL

## 2020-01-09 LAB — GLUCOSE, CAPILLARY
Glucose-Capillary: 82 mg/dL (ref 70–99)
Glucose-Capillary: 82 mg/dL (ref 70–99)

## 2020-01-09 LAB — RPR: RPR Ser Ql: NONREACTIVE

## 2020-01-09 LAB — SARS CORONAVIRUS 2 BY RT PCR (HOSPITAL ORDER, PERFORMED IN ~~LOC~~ HOSPITAL LAB): SARS Coronavirus 2: NEGATIVE

## 2020-01-09 MED ORDER — DIPHENHYDRAMINE HCL 25 MG PO CAPS: 25.0000 mg | ORAL_CAPSULE | Freq: Four times a day (QID) | ORAL | Status: DC | PRN

## 2020-01-09 MED ORDER — SIMETHICONE 80 MG PO CHEW: 80.0000 mg | CHEWABLE_TABLET | ORAL | Status: DC | PRN

## 2020-01-09 MED ORDER — EPHEDRINE 5 MG/ML INJ: 10.0000 mg | INTRAVENOUS | Status: DC | PRN

## 2020-01-09 MED ORDER — ACETAMINOPHEN 325 MG PO TABS
650.0000 mg | ORAL_TABLET | ORAL | Status: DC | PRN
  Administered 2020-01-09: 650 mg via ORAL
  Filled 2020-01-09: qty 2

## 2020-01-09 MED ORDER — LACTATED RINGERS IV SOLN: INTRAVENOUS | Status: DC

## 2020-01-09 MED ORDER — ZOLPIDEM TARTRATE 5 MG PO TABS: 5.0000 mg | ORAL_TABLET | Freq: Every evening | ORAL | Status: DC | PRN

## 2020-01-09 MED ORDER — LACTATED RINGERS IV SOLN: 500.0000 mL | INTRAVENOUS | Status: DC | PRN

## 2020-01-09 MED ORDER — SENNOSIDES-DOCUSATE SODIUM 8.6-50 MG PO TABS
2.0000 | ORAL_TABLET | ORAL | Status: DC
  Administered 2020-01-10: 2 via ORAL
  Filled 2020-01-09 (×2): qty 2

## 2020-01-09 MED ORDER — LACTATED RINGERS IV SOLN: 500.0000 mL | Freq: Once | INTRAVENOUS | Status: DC

## 2020-01-09 MED ORDER — PRENATAL MULTIVITAMIN CH
1.0000 | ORAL_TABLET | Freq: Every day | ORAL | Status: DC
  Administered 2020-01-09 – 2020-01-10 (×2): 1 via ORAL
  Filled 2020-01-09 (×3): qty 1

## 2020-01-09 MED ORDER — DIPHENHYDRAMINE HCL 50 MG/ML IJ SOLN: 12.5000 mg | INTRAMUSCULAR | Status: DC | PRN

## 2020-01-09 MED ORDER — TRANEXAMIC ACID-NACL 1000-0.7 MG/100ML-% IV SOLN
1000.0000 mg | Freq: Once | INTRAVENOUS | Status: AC
  Administered 2020-01-09: 1000 mg via INTRAVENOUS
  Filled 2020-01-09: qty 100

## 2020-01-09 MED ORDER — FENTANYL-BUPIVACAINE-NACL 0.5-0.125-0.9 MG/250ML-% EP SOLN: 12.0000 mL/h | EPIDURAL | Status: DC | PRN

## 2020-01-09 MED ORDER — TETANUS-DIPHTH-ACELL PERTUSSIS 5-2.5-18.5 LF-MCG/0.5 IM SUSP: 0.5000 mL | Freq: Once | INTRAMUSCULAR | Status: DC

## 2020-01-09 MED ORDER — LIDOCAINE HCL (PF) 1 % IJ SOLN: 30.0000 mL | INTRAMUSCULAR | Status: DC | PRN

## 2020-01-09 MED ORDER — BENZOCAINE-MENTHOL 20-0.5 % EX AERO: 1.0000 "application " | INHALATION_SPRAY | CUTANEOUS | Status: DC | PRN

## 2020-01-09 MED ORDER — PENICILLIN G POT IN DEXTROSE 60000 UNIT/ML IV SOLN
3.0000 10*6.[IU] | INTRAVENOUS | Status: DC
  Administered 2020-01-09: 3 10*6.[IU] via INTRAVENOUS
  Filled 2020-01-09: qty 50

## 2020-01-09 MED ORDER — WITCH HAZEL-GLYCERIN EX PADS: 1.0000 "application " | MEDICATED_PAD | CUTANEOUS | Status: DC | PRN

## 2020-01-09 MED ORDER — OXYTOCIN-SODIUM CHLORIDE 30-0.9 UT/500ML-% IV SOLN
2.5000 [IU]/h | INTRAVENOUS | Status: DC
  Filled 2020-01-09: qty 500

## 2020-01-09 MED ORDER — ONDANSETRON HCL 4 MG/2ML IJ SOLN: 4.0000 mg | INTRAMUSCULAR | Status: DC | PRN

## 2020-01-09 MED ORDER — TERBUTALINE SULFATE 1 MG/ML IJ SOLN: 0.2500 mg | Freq: Once | INTRAMUSCULAR | Status: DC | PRN

## 2020-01-09 MED ORDER — PHENYLEPHRINE 40 MCG/ML (10ML) SYRINGE FOR IV PUSH (FOR BLOOD PRESSURE SUPPORT): 80.0000 ug | PREFILLED_SYRINGE | INTRAVENOUS | Status: DC | PRN

## 2020-01-09 MED ORDER — MISOPROSTOL 25 MCG QUARTER TABLET
25.0000 ug | ORAL_TABLET | ORAL | Status: DC | PRN
  Administered 2020-01-09 (×2): 25 ug via VAGINAL
  Filled 2020-01-09 (×2): qty 1

## 2020-01-09 MED ORDER — FENTANYL CITRATE (PF) 100 MCG/2ML IJ SOLN
100.0000 ug | INTRAMUSCULAR | Status: DC | PRN
  Administered 2020-01-09 (×3): 100 ug via INTRAVENOUS
  Filled 2020-01-09 (×3): qty 2

## 2020-01-09 MED ORDER — SODIUM CHLORIDE 0.9 % IV SOLN
5.0000 10*6.[IU] | Freq: Once | INTRAVENOUS | Status: AC
  Administered 2020-01-09: 5 10*6.[IU] via INTRAVENOUS
  Filled 2020-01-09: qty 5

## 2020-01-09 MED ORDER — OXYTOCIN BOLUS FROM INFUSION
333.0000 mL | Freq: Once | INTRAVENOUS | Status: AC
  Administered 2020-01-09: 333 mL via INTRAVENOUS

## 2020-01-09 MED ORDER — SOD CITRATE-CITRIC ACID 500-334 MG/5ML PO SOLN: 30.0000 mL | ORAL | Status: DC | PRN

## 2020-01-09 MED ORDER — ONDANSETRON HCL 4 MG/2ML IJ SOLN: 4.0000 mg | Freq: Four times a day (QID) | INTRAMUSCULAR | Status: DC | PRN

## 2020-01-09 MED ORDER — COCONUT OIL OIL: 1.0000 "application " | TOPICAL_OIL | Status: DC | PRN

## 2020-01-09 MED ORDER — ONDANSETRON HCL 4 MG PO TABS: 4.0000 mg | ORAL_TABLET | ORAL | Status: DC | PRN

## 2020-01-09 MED ORDER — IBUPROFEN 600 MG PO TABS
600.0000 mg | ORAL_TABLET | Freq: Four times a day (QID) | ORAL | Status: DC
  Administered 2020-01-09 – 2020-01-11 (×8): 600 mg via ORAL
  Filled 2020-01-09 (×9): qty 1

## 2020-01-09 MED ORDER — DIBUCAINE (PERIANAL) 1 % EX OINT: 1.0000 "application " | TOPICAL_OINTMENT | CUTANEOUS | Status: DC | PRN

## 2020-01-09 NOTE — MAU Note (Signed)
Pt reports she has been having back all day. Started having sharp vaginal pain and cramping around 1230am. Denies any vag bleeding or leaking at this time.Good fetal movement felt.

## 2020-01-09 NOTE — H&P (Signed)
OBSTETRIC ADMISSION HISTORY AND PHYSICAL  Sonia Allen is a 34 y.o. female [phone] with IUP at [redacted]w[redacted]d presenting for IOL 2/2 elevated BP in MAU. She reports +FMs. No LOF, VB, blurry vision, headaches, peripheral edema, or RUQ pain. She plans on bottle feeding. She requests BTL for birth control (consent 11/15/19).  Dating: By LMP --->  Estimated Date of Delivery: 01/08/20  Sono:   @[redacted]w[redacted]d , normal anatomy, cephalic presentation, 6226J, 41%ile, EFW 4#13  Prenatal History/Complications: Uterine fibroids Proteinuria, baseline 0.59 at 12 weeks Low TSH H/o Marijuana use  Past Medical History: Past Medical History:  Diagnosis Date   Anemia    first and sec pregnancies   Arrhythmia    Chlamydia    Gonorrhea    Headache(784.0)    Hemorrhoids    Hx of migraine headaches    Leiomyoma of uterus in pregnancy     Past Surgical History: Past Surgical History:  Procedure Laterality Date   INDUCED ABORTION  March  2 ,2013   WISDOM TOOTH EXTRACTION      Obstetrical History: OB History    Gravida  9   Para  5   Term  5   Preterm  0   AB  3   Living  5     SAB  1   TAB  2   Ectopic  0   Multiple  0   Live Births  5           Social History: Social History   Socioeconomic History   Marital status: Single    Spouse name: Not on file   Number of children: Not on file   Years of education: Not on file   Highest education level: Not on file  Occupational History   Not on file  Tobacco Use   Smoking status: Never Smoker   Smokeless tobacco: Never Used  Vaping Use   Vaping Use: Never used  Substance and Sexual Activity   Alcohol use: Not Currently    Comment: Pt reports socially   Drug use: Not Currently    Frequency: 14.0 times per week    Types: Marijuana    Comment: Reports not currently smoking   Sexual activity: Not Currently    Birth control/protection: None  Other Topics Concern   Not on file  Social History Narrative   Not  on file   Social Determinants of Health   Financial Resource Strain:    Difficulty of Paying Living Expenses:   Food Insecurity: No Food Insecurity   Worried About Running Out of Food in the Last Year: Never true   [place] in the Last Year: Never true  Transportation Needs: No Transportation Needs   Lack of Transportation (Medical): No   Lack of Transportation (Non-Medical): No  Physical Activity:    Days of Exercise per Week:    Minutes of Exercise per Session:   Stress:    Feeling of Stress :   Social Connections:    Frequency of Communication with Friends and Family:    Frequency of Social Gatherings with Friends and Family:    Attends Religious Services:    Active Member of Clubs or Organizations:    Attends Music therapist:    Marital Status:     Family History: Family History  Problem Relation Age of Onset   Healthy Mother    Healthy Father    Other Neg Hx    Alcohol abuse Neg Hx    Arthritis  Neg Hx    Asthma Neg Hx    Birth defects Neg Hx    Cancer Neg Hx    COPD Neg Hx    Depression Neg Hx    Diabetes Neg Hx    Drug abuse Neg Hx    Early death Neg Hx    Heart disease Neg Hx    Hearing loss Neg Hx    Hyperlipidemia Neg Hx    Hypertension Neg Hx    Kidney disease Neg Hx    Learning disabilities Neg Hx    Mental illness Neg Hx    Mental retardation Neg Hx    Miscarriages / Stillbirths Neg Hx    Stroke Neg Hx    Vision loss Neg Hx     Allergies: Allergies  Allergen Reactions   Latex Rash    Burning sensation    Medications Prior to Admission  Medication Sig Dispense Refill Last Dose   aspirin EC 81 MG tablet Take 1 tablet (81 mg total) by mouth daily. 30 tablet 5    Blood Pressure Monitoring DEVI 1 Device by Does not apply route once a week. 1 Device 0    cefadroxil (DURICEF) 500 MG capsule Take 1 capsule (500 mg total) by mouth 2 (two) times daily. 14 capsule 0    docusate sodium  (COLACE) 100 MG capsule Take 1 capsule (100 mg total) by mouth 3 (three) times daily. (Patient not taking: Reported on 10/25/2019) 10 capsule 0    ferrous gluconate (FERGON) 324 MG tablet Take 1 tablet (324 mg total) by mouth 2 (two) times daily with a meal. (Patient not taking: Reported on 08/24/2019) 90 tablet 3    fluticasone (FLONASE) 50 MCG/ACT nasal spray Place 1 spray into both nostrils as needed for allergies. 16 g 0    Prenatal Vit-Fe Fumarate-FA (PRENATAL VITAMIN PLUS LOW IRON) 27-1 MG TABS Take 1 tablet by mouth daily. (Patient not taking: Reported on 10/25/2019) 30 tablet 12      Review of Systems:  All systems reviewed and negative except as stated in HPI  PE: Blood pressure (!) 136/91, pulse 84, temperature 99.1 F (37.3 C), resp. rate 18, height 5\' 4"  (1.626 m), weight 83.8 kg, last menstrual period 04/03/2019. General appearance: alert and cooperative Lungs: regular rate and effort Heart: regular rate  Abdomen: soft, non-tender Extremities: Homans sign is negative, no sign of DVT Presentation: cephalic EFM: 161 bpm, moderate variability, 15x15 accels, no decels Toco: CTX q6-8 minutes Dilation: Fingertip Exam by:: Glenice Bow, rnc  Prenatal labs: ABO, Rh: B/Positive/-- (12/29 0000) Antibody: Positive, See Final Results (12/29 0000) Rubella: 14.80 (12/29 0000) RPR: Non Reactive (12/29 0000)  HBsAg: Negative (12/29 0000)  HIV: Non Reactive (12/29 0000)  GBS: Positive/-- (06/21 1431)  2 hr GTT not done, A1c 5.3 on 06/28/20  Prenatal Transfer Tool  Maternal Diabetes: unknown Genetic Screening: Declined Maternal Ultrasounds/Referrals: Normal, uterine fibroid Fetal Ultrasounds or other Referrals:  Referred to Materal Fetal Medicine  Maternal Substance Abuse:  Yes:  Type: Marijuana history Significant Maternal Medications:  None Significant Maternal Lab Results: Group B Strep positive  No results found for this or any previous visit (from the past 24  hour(s)).  Patient Active Problem List   Diagnosis Date Noted   Uterine fibroid complicating antenatal care, baby not yet delivered in second trimester 08/23/2019   Low TSH level 07/17/2019   Asymptomatic bacteriuria during pregnancy in first trimester 07/09/2019   Maternal atypical antibody 07/09/2019   Proteinuria 07/03/2019  Supervision of high risk pregnancy, antepartum, second trimester 06/18/2019   Fibroids 06/18/2019   Tooth pain 06/18/2019   Obesity in pregnancy 06/18/2019   Marijuana use 06/16/2017    Assessment: Sonia Allen is a 34 y.o. [id number] at [redacted]w[redacted]d here for IOL 2/2 elevated BP in MAU  1. Labor: Start with cytotec for cervical ripening, FB when appropriate 2. FWB: Cat I 3. Pain: Per Patient Request 4. GBS: Positive, PCN 5. Fibroid uterus: Single myoma LUS measuring 4.2 x 4.2 x 4.1 cm. 6. H/o GDM: no GTT on file. Hgb A1c 5.3 in December 2020. CBGs q4h, may DC if first 2 are normal 7. Elevated BP: GHTN v Pre-E w/o SF. Pre-E labs collected. Has baseline proteinuria with P:Cr 0.59 at 12 weeks. No signs/symptoms. 59. Grand multiparity: TXA prior to delivery. BTL papers signed 11/15/19.   Plan: Admit to L&D.  Risks and benefits of induction were reviewed, including failure of method, prolonged labor, need for further intervention, risk of cesarean.  Patient and family seem to understand these risks and wish to proceed. Options of cytotec, foley bulb, AROM, and pitocin reviewed, with use of each discussed.   Dacotah Cabello L Reeta Kuk, DO  01/09/2020, 2:48 AM

## 2020-01-09 NOTE — Discharge Summary (Addendum)
Postpartum Discharge Summary     Patient Name: Sonia Allen DOB: 12/05/1985 MRN: [id number]  Date of admission: 01/09/2020 Delivery date:01/09/2020  Delivering provider: Starr Lake  Date of discharge: 01/11/2020  Admitting diagnosis: Encounter for induction of labor [Z34.90] Intrauterine pregnancy: [redacted]w[redacted]d    Secondary diagnosis:  Active Problems:   Marijuana use   Gestational hypertension   Fibroids   Obesity in pregnancy   Proteinuria   Maternal atypical antibody   Uterine fibroid complicating antenatal care, baby not yet delivered in second trimester   Encounter for induction of labor  Additional problems: grand multiparity, transient hypertension.     Discharge diagnosis: Term Pregnancy Delivered and Gestational Hypertension (intrapartum & postpartum)                                              Post partum procedures:None Augmentation: Cytotec Complications: None  Hospital course: Induction of Labor With Vaginal Delivery   34y.o. yo [id number] admitted to the hospital 01/09/2020 for induction of labor.  Indication for induction: Postdates.  Patient had a labor course as follows: Arrived to MAU for labor check and stayed due to being postdates. She progressed to complete after 1 dose of cytotec; AROM at delivery. Fetal bradycardia at delivery but delivery was imminent and patient delivered in two contractions. Infant had poor respiratory effort, no tone and poor color, heart rate initially above 100.  Cord cut and clamped x 2, brought immediately to warmer and NICU team called. After assessment by NICU, infant returned for skin to skin (see NICU note). She had some elevated BPs intrapartum (none in severe range) and was started on Nifedipine XL postpartum. Membrane Rupture Time/Date: 9:56 AM ,01/09/2020   Delivery Method:Vaginal, Spontaneous  Episiotomy: None  Lacerations:  None  Details of delivery can be found in separate delivery note.  Patient had  a routine postpartum course, including being normotensive with Nifedipine 3057mL. Patient is discharged home 01/11/20.  Newborn Data: Birth date:01/09/2020  Birth time:9:57 AM  Gender:Female  Living status:Living  Apgars:4 ,6  Weight:3084 g   Magnesium Sulfate received: No BMZ received: No Rhophylac:N/A MMR:N/A T-DaP:Unknown Flu: Yes Transfusion:No  Physical exam  Vitals:   01/10/20 0544 01/10/20 1045 01/10/20 2117 01/11/20 0545  BP: (!) 139/96 116/88 120/75 111/78  Pulse: 73 82 88 79  Resp: _0 Temp: 98.7 F (37.1 C)  98.5 F (36.9 C) 98.9 F (37.2 C)  TempSrc: Oral  Oral Oral  SpO2: 97%     Weight:      Height:       General: alert and cooperative Lochia: appropriate Uterine Fundus: firm Incision: N/A DVT Evaluation: No evidence of DVT seen on physical exam. Labs: Lab Results  Component Value Date   WBC 8.7 01/10/2020   HGB 8.2 (L) 01/10/2020   HCT 26.3 (L) 01/10/2020   MCV 84.8 01/10/2020   PLT 254 01/10/2020   CMP Latest Ref Rng & Units 01/09/2020  Glucose 70 - 99 mg/dL 83  BUN 6 - 20 mg/dL 7  Creatinine 0.44 - 1.00 mg/dL 0.39(L)  Sodium 135 - 145 mmol/L 134(L)  Potassium 3.5 - 5.1 mmol/L 3.8  Chloride 98 - 111 mmol/L 106  CO2 22 - 32 mmol/L 19(L)  Calcium 8.9 - 10.3 mg/dL 9.3  Total Protein 6.5 - 8.1 g/dL  6.5  Total Bilirubin 0.3 - 1.2 mg/dL 0.4  Alkaline Phos 38 - 126 U/L 89  AST 15 - 41 U/L 15  ALT 0 - 44 U/L 11   Edinburgh Score: Edinburgh Postnatal Depression Scale Screening Tool 01/10/2020  I have been able to laugh and see the funny side of things. 0  I have looked forward with enjoyment to things. 0  I have blamed myself unnecessarily when things went wrong. 0  I have been anxious or worried for no good reason. 0  I have felt scared or panicky for no good reason. 0  Things have been getting on top of me. 0  I have been so unhappy that I have had difficulty sleeping. 0  I have felt sad or miserable. 0  I have been so unhappy that  I have been crying. 0  The thought of harming myself has occurred to me. 0  Edinburgh Postnatal Depression Scale Total 0     After visit meds:  Allergies as of 01/11/2020      Reactions   Latex Rash   Burning sensation      Medication List    STOP taking these medications   aspirin EC 81 MG tablet   Blood Pressure Monitoring Devi   cefadroxil 500 MG capsule Commonly known as: DURICEF   docusate sodium 100 MG capsule Commonly known as: Colace     TAKE these medications   ferrous gluconate 324 MG tablet Commonly known as: FERGON Take 1 tablet (324 mg total) by mouth 2 (two) times daily with a meal.   fluticasone 50 MCG/ACT nasal spray Commonly known as: FLONASE Place 1 spray into both nostrils as needed for allergies.   ibuprofen 600 MG tablet Commonly known as: ADVIL Take 1 tablet (600 mg total) by mouth every 6 (six) hours as needed.   NIFEdipine 30 MG 24 hr tablet Commonly known as: ADALAT CC Take 1 tablet (30 mg total) by mouth daily. Start taking on: January 12, 2020   Prenatal Vitamin Plus Low Iron 27-1 MG Tabs Take 1 tablet by mouth daily.        Discharge home in stable condition Infant Feeding: Bottle Infant Disposition:home with mother Discharge instruction: per After Visit Summary and Postpartum booklet. Activity: Advance as tolerated. Pelvic rest for 6 weeks.  Diet: routine diet Future Appointments: Future Appointments  Date Time Provider [place]  01/13/2020  2:35 PM Griffin Basil, MD [facility]   Follow up Visit:   Please schedule this patient for a In person postpartum visit in 4 weeks with the following provider: Any provider Additional Postpartum F/U:patient wants Pap at  postpartum visit  High risk pregnancy complicated by: GHTN; needs BP check in one week Delivery mode:  Vaginal, Spontaneous  Anticipated Birth Control:  Unsure   01/11/2020 Myrtis Ser, CNM  9:38 AM

## 2020-01-10 LAB — CBC
HCT: 26.3 % — ABNORMAL LOW (ref 36.0–46.0)
Hemoglobin: 8.2 g/dL — ABNORMAL LOW (ref 12.0–15.0)
MCH: 26.5 pg (ref 26.0–34.0)
MCHC: 31.2 g/dL (ref 30.0–36.0)
MCV: 84.8 fL (ref 80.0–100.0)
Platelets: 254 10*3/uL (ref 150–400)
RBC: 3.1 MIL/uL — ABNORMAL LOW (ref 3.87–5.11)
RDW: 14.8 % (ref 11.5–15.5)
WBC: 8.7 10*3/uL (ref 4.0–10.5)
nRBC: 0 % (ref 0.0–0.2)

## 2020-01-10 MED ORDER — FERROUS SULFATE 325 (65 FE) MG PO TABS
325.0000 mg | ORAL_TABLET | ORAL | Status: DC
  Administered 2020-01-10: 325 mg via ORAL
  Filled 2020-01-10: qty 1

## 2020-01-10 MED ORDER — SODIUM CHLORIDE 0.9 % IV SOLN
510.0000 mg | Freq: Once | INTRAVENOUS | Status: DC
  Filled 2020-01-10: qty 17

## 2020-01-10 MED ORDER — NIFEDIPINE ER OSMOTIC RELEASE 30 MG PO TB24
30.0000 mg | ORAL_TABLET | Freq: Every day | ORAL | Status: DC
  Administered 2020-01-10 – 2020-01-11 (×2): 30 mg via ORAL
  Filled 2020-01-10 (×2): qty 1

## 2020-01-10 NOTE — Progress Notes (Signed)
CSW received consult for hx of marijuana use.  Referral was screened out due to the following: ~MOB had no documented substance use after initial prenatal visit/+UPT. ~MOB had no positive drug screens after initial prenatal visit/+UPT.  Please consult CSW if current concerns arise or by MOB's request.  CSW will monitor CDS results and make report to Child Protective Services if warranted.   Virgie Dad Maresha Anastos, MSW, LCSW [facility] [place] [phone]

## 2020-01-10 NOTE — Progress Notes (Addendum)
POSTPARTUM PROGRESS NOTE  Post Partum Day 1  Subjective:  Sonia Allen is a 34 y.o. [id number] s/p NSVD at [redacted]w[redacted]d.  She reports she is doing well. No acute events overnight. She denies any problems with ambulating, voiding or po intake. Denies nausea or vomiting.  Pain is well controlled.  Lochia is less than a period now.  Objective: Blood pressure (!) 139/96, pulse 73, temperature 98.7 F (37.1 C), temperature source Oral, resp. rate 18, height 5\' 4"  (1.626 m), weight 83.8 kg, last menstrual period 04/03/2019, SpO2 97 %.  Physical Exam:  General: alert, cooperative and no distress Chest: no respiratory distress Heart:regular rate, distal pulses intact Abdomen: soft, nontender,  Uterine Fundus: firm, appropriately tender DVT Evaluation: No calf swelling or tenderness Extremities: no LE edema Skin: warm, dry  Recent Labs    01/09/20 0224 01/10/20 0510  HGB 9.7* 8.2*  HCT 30.2* 26.3*    Assessment/Plan: Sonia Allen is a 34 y.o. [id number] s/p NSVD at [redacted]w[redacted]d   PPD#1 - Doing well  Routine postpartum care GHTN: mild range BP this AM, start Nifedipine 30mg  XL Anemia: hgb drop to 8.2 after delivery, PO iron (IV already out) Contraception: desires interval BTL, does not want to have it done PP as she does not want her current partner to be aware it is being done. Papers signed on 11/15/2019. She has an appt scheduled for 01/13/2020 with Dr. Elgie Congo, can pre-op at that appt and schedule a time. Message sent to Dr. Elgie Congo and George Hugh.  Feeding: bottle Dispo: Plan for discharge PPD#2 (patient preference).   LOS: 1 day   Augustin Coupe, MD/[facility] OB Fellow  01/10/2020, 10:19 AM

## 2020-01-11 ENCOUNTER — Encounter (HOSPITAL_COMMUNITY): Payer: Self-pay

## 2020-01-11 MED ORDER — IBUPROFEN 600 MG PO TABS: 600.0000 mg | ORAL_TABLET | Freq: Four times a day (QID) | ORAL | 0 refills | Status: DC | PRN

## 2020-01-11 MED ORDER — NIFEDIPINE ER 30 MG PO TB24: 30.0000 mg | ORAL_TABLET | Freq: Every day | ORAL | 3 refills | Status: DC

## 2020-01-11 MED FILL — IBUPROFEN 600 MG TABLET: 600 | 7 days supply | Qty: 30 | Fill #0

## 2020-01-11 MED FILL — NIFEdipine ER 30 MG TB24: 30 | 30 days supply | Qty: 30 | Fill #0

## 2020-01-11 NOTE — Discharge Instructions (Signed)

## 2020-01-12 ENCOUNTER — Telehealth: Payer: Self-pay | Admitting: *Deleted

## 2020-01-12 NOTE — Telephone Encounter (Signed)
Pt left VM message stating that she has had 2 episodes of shaking, feeling hot and couldn't breathe. Both times when were when she woke up. She is concerned that it is because of the medicine she is taking. She requests a call back.

## 2020-01-13 ENCOUNTER — Encounter: Payer: Medicaid Other | Admitting: Obstetrics and Gynecology

## 2020-01-14 ENCOUNTER — Inpatient Hospital Stay (HOSPITAL_COMMUNITY)
Admission: AD | Admit: 2020-01-14 | Discharge: 2020-01-15 | Disposition: A | Discharge status: Home / Self Care | Payer: Medicaid Other | Attending: Obstetrics & Gynecology | Admitting: Obstetrics & Gynecology

## 2020-01-14 ENCOUNTER — Encounter (HOSPITAL_COMMUNITY): Payer: Self-pay

## 2020-01-14 ENCOUNTER — Ambulatory Visit (HOSPITAL_COMMUNITY)
Admission: EM | Admit: 2020-01-14 | Discharge: 2020-01-14 | Disposition: A | Discharge status: Home / Self Care | Payer: Medicaid Other | Attending: Physician Assistant | Admitting: Physician Assistant

## 2020-01-14 ENCOUNTER — Other Ambulatory Visit: Payer: Self-pay

## 2020-01-14 ENCOUNTER — Inpatient Hospital Stay (HOSPITAL_COMMUNITY): Payer: Medicaid Other

## 2020-01-14 ENCOUNTER — Encounter (HOSPITAL_COMMUNITY): Payer: Self-pay | Admitting: Obstetrics & Gynecology

## 2020-01-14 DIAGNOSIS — K0889 Other specified disorders of teeth and supporting structures: Secondary | ICD-10-CM

## 2020-01-14 DIAGNOSIS — R109 Unspecified abdominal pain: Secondary | ICD-10-CM

## 2020-01-14 DIAGNOSIS — D649 Anemia, unspecified: Secondary | ICD-10-CM

## 2020-01-14 DIAGNOSIS — O99210 Obesity complicating pregnancy, unspecified trimester: Secondary | ICD-10-CM

## 2020-01-14 DIAGNOSIS — R000 Tachycardia, unspecified: Secondary | ICD-10-CM

## 2020-01-14 DIAGNOSIS — R8290 Unspecified abnormal findings in urine: Secondary | ICD-10-CM | POA: Diagnosis not present

## 2020-01-14 DIAGNOSIS — N390 Urinary tract infection, site not specified: Secondary | ICD-10-CM

## 2020-01-14 DIAGNOSIS — R0781 Pleurodynia: Secondary | ICD-10-CM | POA: Insufficient documentation

## 2020-01-14 DIAGNOSIS — M545 Low back pain: Secondary | ICD-10-CM | POA: Diagnosis not present

## 2020-01-14 DIAGNOSIS — R1010 Upper abdominal pain, unspecified: Secondary | ICD-10-CM | POA: Insufficient documentation

## 2020-01-14 DIAGNOSIS — R0602 Shortness of breath: Secondary | ICD-10-CM

## 2020-01-14 DIAGNOSIS — D219 Benign neoplasm of connective and other soft tissue, unspecified: Secondary | ICD-10-CM

## 2020-01-14 DIAGNOSIS — M549 Dorsalgia, unspecified: Secondary | ICD-10-CM

## 2020-01-14 DIAGNOSIS — Z79899 Other long term (current) drug therapy: Secondary | ICD-10-CM | POA: Diagnosis not present

## 2020-01-14 DIAGNOSIS — D259 Leiomyoma of uterus, unspecified: Secondary | ICD-10-CM

## 2020-01-14 DIAGNOSIS — O0992 Supervision of high risk pregnancy, unspecified, second trimester: Secondary | ICD-10-CM

## 2020-01-14 LAB — URINALYSIS, ROUTINE W REFLEX MICROSCOPIC
Bilirubin Urine: NEGATIVE
Glucose, UA: NEGATIVE mg/dL
Ketones, ur: NEGATIVE mg/dL
Nitrite: NEGATIVE
Protein, ur: 100 mg/dL — AB
RBC / HPF: >50 RBC/hpf — ABNORMAL HIGH (ref 0–5)
Specific Gravity, Urine: 1.028 (ref 1.005–1.030)
WBC, UA: >50 WBC/hpf — ABNORMAL HIGH (ref 0–5)
pH: 5 (ref 5.0–8.0)

## 2020-01-14 LAB — POCT URINALYSIS DIP (DEVICE)
Glucose, UA: NEGATIVE mg/dL
Nitrite: POSITIVE — AB
Protein, ur: >=300 mg/dL — AB
Specific Gravity, Urine: >=1.03 (ref 1.005–1.030)
Urobilinogen, UA: 1 mg/dL (ref 0.0–1.0)
pH: 5.5 (ref 5.0–8.0)

## 2020-01-14 LAB — CBC
HCT: 27.5 % — ABNORMAL LOW (ref 36.0–46.0)
Hemoglobin: 8.6 g/dL — ABNORMAL LOW (ref 12.0–15.0)
MCH: 26.3 pg (ref 26.0–34.0)
MCHC: 31.3 g/dL (ref 30.0–36.0)
MCV: 84.1 fL (ref 80.0–100.0)
Platelets: 274 10*3/uL (ref 150–400)
RBC: 3.27 MIL/uL — ABNORMAL LOW (ref 3.87–5.11)
RDW: 15.6 % — ABNORMAL HIGH (ref 11.5–15.5)
WBC: 10.4 10*3/uL (ref 4.0–10.5)
nRBC: 0 % (ref 0.0–0.2)

## 2020-01-14 MED ORDER — SODIUM CHLORIDE 0.9 % IV SOLN
510.0000 mg | Freq: Once | INTRAVENOUS | Status: AC
  Administered 2020-01-15: 510 mg via INTRAVENOUS
  Filled 2020-01-14: qty 17

## 2020-01-14 NOTE — ED Provider Notes (Signed)
[place]    CSN: [id number] Arrival date & time: 01/14/20  1745      History   Chief Complaint Chief Complaint  Patient presents with  . Back Pain    HPI Sonia Allen is a 34 y.o. female.   Patient reports urgent care for evaluation of back pain, belly pain, leg heaviness and shortness of breath.  She gave birth 5 days ago vaginally.  Since then she has had shortness of breath when laying down as well as with activity.  She also is reported lots of leg heaviness at night.  Denies leg swelling or pain just they feel heavy.  This has been progressively getting worse.  She also reports worsening back pain over the last few days.  She does report she had back pain prior to birth however this feels different.  She has had one episode of painful urination.  No frequency or urgency.  She does report abdominal pain and points to the front of her belly.  She reports vigorous chills at night.  She has been taking ibuprofen scheduled and has not taken a temperature.  She reports she has continued to have vaginal spotting since delivery.  She has not picked up her iron supplements, she was anemic with hemoglobin of 8.3 at discharge.  She reports she talked to her OB/GYN and she was instructed to come to urgent care to rule out" infection".  Patient does report she has been taking the blood pressure medicine, but states she is definitely not started taking any iron.     Past Medical History:  Diagnosis Date  . Anemia    first and sec pregnancies  . Arrhythmia   . Chlamydia   . Gonorrhea   . Headache(784.0)   . Hemorrhoids   . Hx of migraine headaches   . Leiomyoma of uterus in pregnancy     Patient Active Problem List   Diagnosis Date Noted  . Encounter for induction of labor 01/09/2020  . Uterine fibroid complicating antenatal care, baby not yet delivered in second trimester 08/23/2019  . Low TSH level 07/17/2019  . Asymptomatic bacteriuria during pregnancy in first  trimester 07/09/2019  . Maternal atypical antibody 07/09/2019  . Proteinuria 07/03/2019  . Supervision of high risk pregnancy, antepartum, second trimester 06/18/2019  . Fibroids 06/18/2019  . Tooth pain 06/18/2019  . Obesity in pregnancy 06/18/2019  . Gestational hypertension 12/17/2017  . Marijuana use 06/16/2017    Past Surgical History:  Procedure Laterality Date  . INDUCED ABORTION  March  2 ,2013  . WISDOM TOOTH EXTRACTION      OB History    Gravida  9   Para  5   Term  5   Preterm  0   AB  3   Living  5     SAB  1   TAB  2   Ectopic  0   Multiple  0   Live Births  5            Home Medications    Prior to Admission medications   Medication Sig Start Date End Date Taking? Authorizing Provider  ferrous gluconate (FERGON) 324 MG tablet Take 1 tablet (324 mg total) by mouth 2 (two) times daily with a meal. Patient not taking: Reported on 08/24/2019 07/26/19   Truett Mainland, DO  fluticasone [facility]) 50 MCG/ACT nasal spray Place 1 spray into both nostrils as needed for allergies. 12/13/19   Cherre Blanc, MD  ibuprofen (ADVIL) 600 MG tablet Take 1 tablet (600 mg total) by mouth every 6 (six) hours as needed. 01/11/20   Myrtis Ser, CNM  NIFEdipine (ADALAT CC) 30 MG 24 hr tablet Take 1 tablet (30 mg total) by mouth daily. 01/12/20   Myrtis Ser, CNM  Prenatal Vit-Fe Fumarate-FA (PRENATAL VITAMIN PLUS LOW IRON) 27-1 MG TABS Take 1 tablet by mouth daily. 06/18/19   Luvenia Redden, PA-C    Family History Family History  Problem Relation Age of Onset  . Healthy Mother   . Healthy Father   . Other Neg Hx   . Alcohol abuse Neg Hx   . Arthritis Neg Hx   . Asthma Neg Hx   . Birth defects Neg Hx   . Cancer Neg Hx   . COPD Neg Hx   . Depression Neg Hx   . Diabetes Neg Hx   . Drug abuse Neg Hx   . Early death Neg Hx   . Heart disease Neg Hx   . Hearing loss Neg Hx   . Hyperlipidemia Neg Hx   . Hypertension Neg Hx   . Kidney disease  Neg Hx   . Learning disabilities Neg Hx   . Mental illness Neg Hx   . Mental retardation Neg Hx   . Miscarriages / Stillbirths Neg Hx   . Stroke Neg Hx   . Vision loss Neg Hx     Social History Social History   Tobacco Use  . Smoking status: Never Smoker  . Smokeless tobacco: Never Used  Vaping Use  . Vaping Use: Never used  Substance Use Topics  . Alcohol use: Not Currently    Comment: Pt reports socially  . Drug use: Not Currently    Frequency: 14.0 times per week    Types: Marijuana    Comment: Reports not currently smoking     Allergies   Latex   Review of Systems Review of Systems   Physical Exam Triage Vital Signs ED Triage Vitals  Enc Vitals Group     BP 01/14/20 1833 (!) 136/93     Pulse Rate 01/14/20 1833 100     Resp 01/14/20 1833 20     Temp 01/14/20 1833 100.1 F (37.8 C)     Temp Source 01/14/20 1833 Oral     SpO2 01/14/20 1833 100 %     Weight --      Height --      Head Circumference --      Peak Flow --      Pain Score 01/14/20 1832 10     Pain Loc --      Pain Edu? --      Excl. in [place]? --    No data found.  Updated Vital Signs BP (!) 136/93 (BP Location: Right Arm)   Pulse 100   Temp 100.1 F (37.8 C) (Oral)   Resp 20   SpO2 100%   Visual Acuity Right Eye Distance:   Left Eye Distance:   Bilateral Distance:    Right Eye Near:   Left Eye Near:    Bilateral Near:     Physical Exam Vitals and nursing note reviewed.  Constitutional:      General: She is not in acute distress.    Appearance: She is well-developed. She is ill-appearing.  HENT:     Head: Normocephalic and atraumatic.     Mouth/Throat:     Mouth: Mucous membranes are moist.  Comments: Pale mucous membranes Eyes:     Extraocular Movements: Extraocular movements intact.     Conjunctiva/sclera: Conjunctivae normal.     Pupils: Pupils are equal, round, and reactive to light.  Cardiovascular:     Rate and Rhythm: Regular rhythm. Tachycardia present.      Heart sounds: No murmur heard.   Pulmonary:     Effort: Pulmonary effort is normal. No respiratory distress.     Breath sounds: Normal breath sounds.     Comments: There is some laboring of breathing.  Patient gets through 4-5 words Abdominal:     Palpations: Abdomen is soft.     Tenderness: There is abdominal tenderness (Generalized).     Comments: Patient has questionable CVA tenderness, there is also tenderness with palpation of the musculature of the back.  Musculoskeletal:     Cervical back: Neck supple.     Right lower leg: No edema.     Left lower leg: No edema.  Skin:    General: Skin is warm and dry.     Findings: No rash.  Neurological:     Mental Status: She is alert.      UC Treatments / Results  Labs (all labs ordered are listed, but only abnormal results are displayed) Labs Reviewed  POCT URINALYSIS DIP (DEVICE) - Abnormal; Notable for the following components:      Result Value   Bilirubin Urine SMALL (*)    Ketones, ur TRACE (*)    Hgb urine dipstick LARGE (*)    Protein, ur >=300 (*)    Nitrite POSITIVE (*)    Leukocytes,Ua SMALL (*)    All other components within normal limits    EKG   Radiology No results found.  Procedures Procedures (including critical care time)  Medications Ordered in UC Medications - No data to display  Initial Impression / Assessment and Plan / UC Course  I have reviewed the triage vital signs and the nursing notes.  Pertinent labs & imaging results that were available during my care of the patient were reviewed by me and considered in my medical decision making (see chart for details).     #Symptomatic anemia #Tachycardia #Back pain #Abdominal pain ##UTI Patient is a 34 year old who recently gave birth vaginally with anemia, back pain, tachycardia and newfound UTI.  Chart review shows anemia to 8.2 on 7/12 at hospital discharge.  Patient has not started her iron supplements, now has shortness of breath with  labored and tachycardia, likely symptomatic anemia.  Likely has a fever, though 100.1 in clinic recent ibuprofen.  Given known infection but also concern for symptomatic anemia highly recommend patient be further evaluated in the emergency department tonight.  Patient did not seem to completely understand her hemoglobin counts and anemia, I spent time explaining this.  Strongly advised the patient report to the emergency department for further evaluation and that that is my official recommendation.  Patient agrees that she will report to the emergency department tonight following discharge from urgent care.  Feel patient is stable for self transport. -I did ask the patient if she understood the reason for emergency department referral, and she does state" my blood counts are low". -I did also inform the patient that her urine did show signs of infection, however stated that this does not change her decision given her recent history it it only makes it more important for her to be further evaluated tonight in the emergency department.  She verbalized understanding agreement of this.  Final Clinical Impressions(s) / UC Diagnoses   Final diagnoses:  Acute bilateral back pain, unspecified back location  Anemia, unspecified type  Tachycardia  Abdominal pain, unspecified abdominal location     Discharge Instructions     You need to be further evaluated in the Emergency Department tonight given your recent birth and symptoms here today.    ED Prescriptions    None     PDMP not reviewed this encounter.   Purnell Shoemaker, PA-C 01/14/20 1942

## 2020-01-14 NOTE — MAU Note (Signed)
PT SAYS SHE DEL VAG ON 7-11.  FEELS BACK PAIN, UPPER ABD , LEGS HEAVY, SOMETIMES H/A- NONE NOW-- ALL STARTED WED.  PLUS HAD TEMP AT URGENT CARE- 101 TODAY AT 7PM .  TOOK IBUPROFEN 600MG  AT 6 PM  .   ALSO  FEELS SOB-  TOLD TO COME TO HERE.  BOTTLE FEEDING

## 2020-01-14 NOTE — MAU Provider Note (Signed)
History     [id number]  Arrival date and time: 01/14/20 2037    Chief Complaint  Patient presents with  . Abdominal Pain     HPI Sonia Allen is a 34 y.o. s/p NSVD on 01/09/2020 with PMHx notable for PP gHTN, who presents for multiple complaints.   Review of discharge summary from last admission on 01/11/2020:  Recent admission for uncomplicated NSVD. After delivery had PP HTN, diagnosed w gHTN and started on Nifedipine XL 30mg .   Review of urgent care note from today: C/f symptomatic anemia and possible UTI, sent to MAU  Today presents for lower back pain, heaviness in both legs, "chest hurts", stomach hurts Symptoms all started three days ago When asked about chest hurting reports it is actually a tightness when she takes a deep breath Not worse with walking around or exertion No swelling in legs No personal or family hx of blood clots Not on birth control currently, planning to get BTL in a few weeks Bleeding is normal she says, like less than a period Has had some headaches over past few days but none today No pain in RUQ pain Denies burning or pain with urination Has been taking Nifedipine since discharge, though did not take it today or yesterday Reports she went to urgent care around 1800 and was told she had a temperature of 100.1 F, told to come here Also endorses some pain in lower abdomen out of the blue, but not constant Has taken ibuprofen past few days, all symptoms improve with taking that  --/--/B POS (07/11 0250)  OB History as of 01/11/2020    Gravida  9   Para  5   Term  5   Preterm  0   AB  3   Living  5     SAB  1   TAB  2   Ectopic  0   Multiple  0   Live Births  5           Past Medical History:  Diagnosis Date  . Anemia    first and sec pregnancies  . Arrhythmia   . Chlamydia   . Gonorrhea   . Headache(784.0)   . Hemorrhoids   . Hx of migraine headaches   . Leiomyoma of uterus in pregnancy     Past Surgical  History:  Procedure Laterality Date  . INDUCED ABORTION  March  2 ,2013  . WISDOM TOOTH EXTRACTION      Family History  Problem Relation Age of Onset  . Healthy Mother   . Healthy Father   . Other Neg Hx   . Alcohol abuse Neg Hx   . Arthritis Neg Hx   . Asthma Neg Hx   . Birth defects Neg Hx   . Cancer Neg Hx   . COPD Neg Hx   . Depression Neg Hx   . Diabetes Neg Hx   . Drug abuse Neg Hx   . Early death Neg Hx   . Heart disease Neg Hx   . Hearing loss Neg Hx   . Hyperlipidemia Neg Hx   . Hypertension Neg Hx   . Kidney disease Neg Hx   . Learning disabilities Neg Hx   . Mental illness Neg Hx   . Mental retardation Neg Hx   . Miscarriages / Stillbirths Neg Hx   . Stroke Neg Hx   . Vision loss Neg Hx     Social History   Socioeconomic History  .  Marital status: Single    Spouse name: Not on file  . Number of children: Not on file  . Years of education: Not on file  . Highest education level: Not on file  Occupational History  . Not on file  Tobacco Use  . Smoking status: Never Smoker  . Smokeless tobacco: Never Used  Vaping Use  . Vaping Use: Never used  Substance and Sexual Activity  . Alcohol use: Not Currently    Comment: Pt reports socially  . Drug use: Not Currently    Frequency: 14.0 times per week    Types: Marijuana    Comment: Reports not currently smoking  . Sexual activity: Not Currently    Birth control/protection: None  Other Topics Concern  . Not on file  Social History Narrative  . Not on file   Social Determinants of Health   Financial Resource Strain:   . Difficulty of Paying Living Expenses:   Food Insecurity: No Food Insecurity  . Worried About Charity fundraiser in the Last Year: Never true  . Ran Out of Food in the Last Year: Never true  Transportation Needs: No Transportation Needs  . Lack of Transportation (Medical): No  . Lack of Transportation (Non-Medical): No  Physical Activity:   . Days of Exercise per Week:   .  Minutes of Exercise per Session:   Stress:   . Feeling of Stress :   Social Connections:   . Frequency of Communication with Friends and Family:   . Frequency of Social Gatherings with Friends and Family:   . Attends Religious Services:   . Active Member of Clubs or Organizations:   . Attends Archivist Meetings:   Marland Kitchen Marital Status:   Intimate Partner Violence:   . Fear of Current or Ex-Partner:   . Emotionally Abused:   Marland Kitchen Physically Abused:   . Sexually Abused:     Allergies  Allergen Reactions  . Latex Rash    Burning sensation    No current facility-administered medications on file prior to encounter.   Current Outpatient Medications on File Prior to Encounter  Medication Sig Dispense Refill  . ferrous gluconate (FERGON) 324 MG tablet Take 1 tablet (324 mg total) by mouth 2 (two) times daily with a meal. (Patient not taking: Reported on 08/24/2019) 90 tablet 3  . fluticasone (FLONASE) 50 MCG/ACT nasal spray Place 1 spray into both nostrils as needed for allergies. 16 g 0  . ibuprofen (ADVIL) 600 MG tablet Take 1 tablet (600 mg total) by mouth every 6 (six) hours as needed. 30 tablet 0  . NIFEdipine (ADALAT CC) 30 MG 24 hr tablet Take 1 tablet (30 mg total) by mouth daily. 30 tablet 3  . Prenatal Vit-Fe Fumarate-FA (PRENATAL VITAMIN PLUS LOW IRON) 27-1 MG TABS Take 1 tablet by mouth daily. 30 tablet 12     ROS Pertinent positives and negative per HPI, all others reviewed and negative  Physical Exam   BP 125/80 (BP Location: Right Arm)   Pulse 90   Temp 98.8 F (37.1 C) (Oral)   Resp 18   Ht 5\' 3"  (1.6 m)   Wt 78 kg   LMP 04/03/2019 (Exact Date)   SpO2 100%   BMI 30.47 kg/m   Physical Exam Vitals reviewed.  Constitutional:      General: She is not in acute distress.    Appearance: She is well-developed. She is not diaphoretic.  Eyes:     General: No scleral  icterus. Cardiovascular:     Rate and Rhythm: Normal rate and regular rhythm.     Heart  sounds: Normal heart sounds. No murmur heard.   Pulmonary:     Effort: Pulmonary effort is normal. No respiratory distress.     Breath sounds: Normal breath sounds. No stridor. No wheezing, rhonchi or rales.  Abdominal:     General: There is no distension.     Palpations: Abdomen is soft.     Tenderness: There is no abdominal tenderness. There is no guarding or rebound.     Comments: Uterus firm and midline one finger below umbilicus  Musculoskeletal:     Right lower leg: No swelling. No edema.     Left lower leg: No swelling. No edema.  Skin:    General: Skin is warm and dry.  Neurological:     Mental Status: She is alert.     Coordination: Coordination normal.     Cervical Exam  Not done  Bedside Ultrasound Not done  My interpretation: n/a  FHT N/a  Labs Results for orders placed or performed during the hospital encounter of 01/14/20 (from the past 24 hour(s))  Urinalysis, Routine w reflex microscopic     Status: Abnormal   Collection Time: 01/14/20  9:18 PM  Result Value Ref Range   Color, Urine AMBER (A) YELLOW   APPearance CLOUDY (A) CLEAR   Specific Gravity, Urine 1.028 1.005 - 1.030   pH 5.0 5.0 - 8.0   Glucose, UA NEGATIVE NEGATIVE mg/dL   Hgb urine dipstick LARGE (A) NEGATIVE   Bilirubin Urine NEGATIVE NEGATIVE   Ketones, ur NEGATIVE NEGATIVE mg/dL   Protein, ur 100 (A) NEGATIVE mg/dL   Nitrite NEGATIVE NEGATIVE   Leukocytes,Ua MODERATE (A) NEGATIVE   RBC / HPF >50 (H) 0 - 5 RBC/hpf   WBC, UA >50 (H) 0 - 5 WBC/hpf   Bacteria, UA MANY (A) NONE SEEN   Squamous Epithelial / LPF 0-5 0 - 5   WBC Clumps PRESENT    Mucus PRESENT     Imaging No results found.  MAU Course  Procedures  Lab Orders     Urinalysis, Routine w reflex microscopic No orders of the defined types were placed in this encounter.  Imaging Orders  No imaging studies ordered today    MDM moderate  Assessment and Plan  #Chest tightness #Lower back pain #Abnormal  UA #Anemia Patient presenting with multiple vague complaints of unclear etiology. Highest concern for infection however low suspicion for this overall. CBC with normal white count, physical exam shows clear lungs (PNA unlikely) and unremarkable abdominal exam (endometritis unlikely). UA is abnormal however has hx of abnormal proteinuria and patient reports no symptoms, but will send UCx regardless to evaluate. Other consideration would be PE however patient not tachycardic, no fever here (though borderline at urgent care earlier), and no LE edema, no family hx of clots, overall low suspicion. Most likely etiology of symptoms is secondary to anemia, not low enough to warrant blood but will give IV iron in MAU and then d/c to home.    Clarnce Flock

## 2020-01-14 NOTE — Discharge Instructions (Addendum)
You need to be further evaluated in the Emergency Department tonight given your recent birth and symptoms here today.

## 2020-01-14 NOTE — ED Triage Notes (Signed)
Pt reports 3 days ago she started shaking and having heaviness sin her calf, lower back pain and chills. Pt had natural birth 5 days ago. Pt states OBGYN told her to visit the UC and ruled out "infection". Pt taking ibuprofen, last dose 20 min ago.

## 2020-01-15 DIAGNOSIS — O99893 Other specified diseases and conditions complicating puerperium: Secondary | ICD-10-CM | POA: Diagnosis not present

## 2020-01-15 DIAGNOSIS — O9081 Anemia of the puerperium: Secondary | ICD-10-CM | POA: Diagnosis not present

## 2020-01-15 DIAGNOSIS — M545 Low back pain: Secondary | ICD-10-CM | POA: Diagnosis not present

## 2020-01-15 DIAGNOSIS — R079 Chest pain, unspecified: Secondary | ICD-10-CM | POA: Diagnosis not present

## 2020-01-15 DIAGNOSIS — R1010 Upper abdominal pain, unspecified: Secondary | ICD-10-CM | POA: Diagnosis not present

## 2020-01-15 NOTE — Discharge Instructions (Signed)

## 2020-01-17 LAB — URINE CULTURE: Culture: >=100000 — AB

## 2020-01-18 ENCOUNTER — Ambulatory Visit: Payer: Medicaid Other

## 2020-01-18 ENCOUNTER — Other Ambulatory Visit: Payer: Self-pay | Admitting: Family Medicine

## 2020-01-18 MED ORDER — AMOXICILLIN-POT CLAVULANATE 875-125 MG PO TABS: 1.0000 | ORAL_TABLET | Freq: Two times a day (BID) | ORAL | 0 refills | Status: AC

## 2020-01-18 NOTE — Progress Notes (Signed)
+  urine culture from MAU visit No real symptoms but would like treatment Rx for Augmentin sent

## 2020-01-21 NOTE — Telephone Encounter (Signed)
Per chart review, patient went to MAU for concerns.

## 2020-02-07 ENCOUNTER — Encounter (HOSPITAL_BASED_OUTPATIENT_CLINIC_OR_DEPARTMENT_OTHER): Payer: Self-pay | Admitting: Obstetrics and Gynecology

## 2020-02-07 ENCOUNTER — Other Ambulatory Visit: Payer: Self-pay

## 2020-02-11 ENCOUNTER — Other Ambulatory Visit (HOSPITAL_COMMUNITY): Payer: Medicaid Other

## 2020-02-14 ENCOUNTER — Other Ambulatory Visit: Payer: Self-pay

## 2020-02-14 ENCOUNTER — Other Ambulatory Visit (HOSPITAL_COMMUNITY)
Admission: RE | Admit: 2020-02-14 | Discharge: 2020-02-14 | Disposition: A | Discharge status: Home / Self Care | Payer: Medicaid Other | Source: Ambulatory Visit | Attending: Obstetrics and Gynecology | Admitting: Obstetrics and Gynecology

## 2020-02-14 ENCOUNTER — Encounter: Payer: Self-pay | Admitting: Obstetrics and Gynecology

## 2020-02-14 ENCOUNTER — Ambulatory Visit (INDEPENDENT_AMBULATORY_CARE_PROVIDER_SITE_OTHER): Payer: Medicaid Other | Admitting: Obstetrics and Gynecology

## 2020-02-14 DIAGNOSIS — Z3202 Encounter for pregnancy test, result negative: Secondary | ICD-10-CM

## 2020-02-14 DIAGNOSIS — Z20822 Contact with and (suspected) exposure to covid-19: Secondary | ICD-10-CM | POA: Diagnosis not present

## 2020-02-14 DIAGNOSIS — Z3009 Encounter for other general counseling and advice on contraception: Secondary | ICD-10-CM | POA: Insufficient documentation

## 2020-02-14 DIAGNOSIS — Z01812 Encounter for preprocedural laboratory examination: Secondary | ICD-10-CM | POA: Insufficient documentation

## 2020-02-14 LAB — SARS CORONAVIRUS 2 (TAT 6-24 HRS): SARS Coronavirus 2: NEGATIVE

## 2020-02-14 NOTE — Progress Notes (Signed)
[facility]ne[facility] Attending History and Physical  Sonia Allen is a 34 y.o. [phone] at Unknown who presented to [facility] today for preoperative evaluation for bilateral tubal ligation on 02/15/2020.  Denies any abnormal vaginal discharge, fevers, chills, sweats, dysuria, nausea, vomiting, other GI or GU symptoms or other general symptoms.  Discussed in detail the sterilization procedure.  She understands that there are non-permanent options which are extremely effective.  Pt desires permanent sterilization.  Risks and benefits of the procedure were discussed including bleeding, infection, involvement of other organs including bladder and bowel.  Risk of failure discussed including ectopic pregnancy.  Risk of regret discussed.  Also noted filshie clips versus salpingectomy for cancer prevention and sterilization.  Pt desires filshie clips at this time.  Past Medical History:  Diagnosis Date  . Anemia    first and sec pregnancies  . Arrhythmia   . Chlamydia   . Gonorrhea   . Headache(784.0)   . Hemorrhoids   . Hx of migraine headaches   . Leiomyoma of uterus in pregnancy    Past Surgical History:  Procedure Laterality Date  . INDUCED ABORTION  March  2 ,2013  . WISDOM TOOTH EXTRACTION     OB History  Gravida Para Term Preterm AB Living  9 5 5  0 3 5  SAB TAB Ectopic Multiple Live Births  1 2 0 0 5    # Outcome Date GA Lbr Len/2nd Weight Sex Delivery Anes PTL Lv  9 Gravida           8 Term 12/17/17 [redacted]w[redacted]d 71:00 / 01:07 7 lb 4.2 oz (3.294 kg) F Vag-Spont EPI  LIV     Birth Comments: WNL  7 SAB 10/2016 [redacted]w[redacted]d         6 TAB 2018          5 Term 03/12/14 [redacted]w[redacted]d 06:41 / 00:16 7 lb 10.2 oz (3.465 kg) F Vag-Spont EPI  LIV     Birth Comments: no complications  4 Term 96/28/36 [redacted]w[redacted]d 03:40 / 00:13 7 lb 5.6 oz (3.334 kg) M Vag-Spont EPI  LIV     Birth Comments: +GBS  3 TAB 2013 [redacted]w[redacted]d            Birth Comments: System Generated. Please review and update  pregnancy details.  2 Term 08/28/10 [redacted]w[redacted]d 07:00 8 lb 13 oz (3.997 kg) M Vag-Spont EPI  LIV     Birth Comments: leiomyomatous uterus  1 Term 01/14/08 [redacted]w[redacted]d 06:00 6 lb 2 oz (2.778 kg) M Vag-Spont EPI  LIV     Birth Comments: no complications  Patient denies any other pertinent gynecologic issues.  Current Outpatient Medications on File Prior to Visit  Medication Sig Dispense Refill  . fluticasone (FLONASE) 50 MCG/ACT nasal spray Place 1 spray into both nostrils as needed for allergies. 16 g 0  . ibuprofen (ADVIL) 600 MG tablet Take 1 tablet (600 mg total) by mouth every 6 (six) hours as needed. 30 tablet 0   No current facility-administered medications on file prior to visit.   Allergies  Allergen Reactions  . Latex Rash    Burning sensation    Social History:   reports that she has never smoked. She has never used smokeless tobacco. She reports previous alcohol use. She reports previous drug use. Frequency: 14.00 times per week. Drug: Marijuana. Family History  Problem Relation Age of Onset  . Healthy Mother   . Healthy Father   . Other Neg Hx   .  Alcohol abuse Neg Hx   . Arthritis Neg Hx   . Asthma Neg Hx   . Birth defects Neg Hx   . Cancer Neg Hx   . COPD Neg Hx   . Depression Neg Hx   . Diabetes Neg Hx   . Drug abuse Neg Hx   . Early death Neg Hx   . Heart disease Neg Hx   . Hearing loss Neg Hx   . Hyperlipidemia Neg Hx   . Hypertension Neg Hx   . Kidney disease Neg Hx   . Learning disabilities Neg Hx   . Mental illness Neg Hx   . Mental retardation Neg Hx   . Miscarriages / Stillbirths Neg Hx   . Stroke Neg Hx   . Vision loss Neg Hx     Review of Systems: Pertinent items noted in HPI and remainder of comprehensive ROS otherwise negative.  PHYSICAL EXAM: Blood pressure 120/73, pulse 77, height 5\' 4"  (1.626 m), weight 172 lb 4.8 oz (78.2 kg), last menstrual period 01/31/2020, unknown if currently breastfeeding. CONSTITUTIONAL: Well-developed, well-nourished female  in no acute distress.  HENT:  Normocephalic, atraumatic, External right and left ear normal. Oropharynx is clear and moist EYES: Conjunctivae and EOM are normal. Pupils are equal, round, and reactive to light. No scleral icterus.  NECK: Normal range of motion, supple, no masses SKIN: Skin is warm and dry. No rash noted. Not diaphoretic. No erythema. No pallor. NEUROLOGIC: Alert and oriented to person, place, and time. Normal reflexes, muscle tone coordination. No cranial nerve deficit noted. PSYCHIATRIC: Normal mood and affect. Normal behavior. Normal judgment and thought content. CARDIOVASCULAR: Normal heart rate noted, regular rhythm RESPIRATORY: Effort and breath sounds normal, no problems with respiration noted ABDOMEN: Soft, nontender, nondistended. PELVIC: Normal appearing external genitalia; normal appearing vaginal mucosa and cervix.  Normal appearing discharge.  Normal uterine size, no other palpable masses, no uterine or adnexal tendernessMUSCULOSKELETAL: Normal range of motion. No tenderness.  No cyanosis, clubbing, or edema.  2+ distal pulses.  Labs: No results found for this or any previous visit (from the past 336 hour(s)).  Imaging Studies: No results found.  Assessment: Encounter for permanent sterilization  Plan: Laparoscopic bilateral tubal ligation with Filshie clips.  Again risks and benefits discussed in detail. Consent will be signed at surgery center.   Lynnda Shields, MD, [facility], [place]

## 2020-02-14 NOTE — Progress Notes (Signed)
    [facility] Partum Visit Note  Sonia Allen is a 34 y.o. [phone] female who presents for a postpartum visit. She is 5 weeks postpartum following a normal spontaneous vaginal delivery.  I have fully reviewed the prenatal and intrapartum course. The delivery was at 40 gestational weeks.  Anesthesia: none. Postpartum course has been uncomplicated. Baby is doing well. Baby is feeding by bottle - [employer] Good Start. Bleeding noted with first postpartum menses.. Bowel function is normal. Bladder function is normal. Patient is sexually active. Last intercourse 2 weeks ago. Contraception method is planned tubal ligation. Postpartum depression screening: negative.   The pregnancy intention screening data noted above was reviewed. Potential methods of contraception were discussed. The patient elected to proceed with Female Sterilization.      The following portions of the patient's history were reviewed and updated as appropriate: allergies, current medications, past family history, past medical history, past social history, past surgical history and problem list.  Review of Systems A comprehensive review of systems was negative.    Objective:  Blood pressure 120/73, pulse 77, height 5\' 4"  (1.626 m), weight 172 lb 4.8 oz (78.2 kg), last menstrual period 01/31/2020, unknown if currently breastfeeding.  General:  alert, cooperative and no distress   Breasts:  inspection negative, no nipple discharge or bleeding, no masses or nodularity palpable  Lungs: clear to auscultation bilaterally  Heart:  regular rate and rhythm  Abdomen: soft, non-tender; bowel sounds normal; no masses,  no organomegaly   Vulva:  normal  Vagina: normal vagina  Cervix:  no cervical motion tenderness  Corpus: normal size, contour, position, consistency, mobility, non-tender  Adnexa:  normal adnexa  Rectal Exam: Not performed.        Assessment:    Normal postpartum exam. Pap smear not done at today's visit.   Plan:    Essential components of care per ACOG recommendations:  1.  Mood and well being: Patient with negative depression screening today. Reviewed local resources for support.  - Patient does not use tobacco. If using tobacco we discussed reduction and for recently cessation risk of relapse - hx of drug use? Yes   If yes, discussed support systems  2. Infant care and feeding:  -Patient currently breastmilk feeding? No If breastmilk feeding discussed return to work and pumping. If needed, patient was provided letter for work to allow for every 2-3 hr pumping breaks, and to be granted a private location to express breastmilk and refrigerated area to store breastmilk. Reviewed importance of draining breast regularly to support lactation. -Social determinants of health (SDOH) reviewed in EPIC. No concerns notedThe following needs were identified n/a.  3. Sexuality, contraception and birth spacing - Patient does not want a pregnancy in the next year.  Desired family size is 5 children.  - Reviewed forms of contraception in tiered fashion. Patient desired bilateral tubal ligation today.   - Discussed birth spacing of 18 months  4. Sleep and fatigue -Encouraged family/partner/community support of 4 hrs of uninterrupted sleep to help with mood and fatigue  5. Physical Recovery  - Discussed patients delivery and complications - Patient had a no  laceration, perineal healing reviewed. Patient expressed understanding - Patient has urinary incontinence? No  - Patient is safe to resume physical and sexual activity  6.  Health Maintenance - Last pap smear done 05/19/2017 and was normal with negative HPV. - PCP follow up  Griffin Basil, MD Center for [facility], [place]

## 2020-02-14 NOTE — Patient Instructions (Signed)

## 2020-02-14 NOTE — Progress Notes (Signed)
Left message for pt to go for covid test today, also left message for [place] stating pt has not gone for covid test.

## 2020-02-15 ENCOUNTER — Encounter (HOSPITAL_COMMUNITY): Payer: Self-pay | Admitting: Anesthesiology

## 2020-02-15 ENCOUNTER — Ambulatory Visit (HOSPITAL_BASED_OUTPATIENT_CLINIC_OR_DEPARTMENT_OTHER)
Admission: RE | Admit: 2020-02-15 | Payer: Medicaid Other | Source: Home / Self Care | Admitting: Obstetrics and Gynecology

## 2020-02-15 LAB — POCT PREGNANCY, URINE: Preg Test, Ur: NEGATIVE

## 2020-02-15 SURGERY — SALPINGECTOMY, BILATERAL, LAPAROSCOPIC
Anesthesia: Choice | Laterality: Bilateral

## 2020-02-16 ENCOUNTER — Telehealth: Payer: Self-pay

## 2020-02-16 NOTE — Telephone Encounter (Signed)
Received a message from Sonia Allen that patient called and wanted to reschedule her surgery. Called the patient, spoke w/ Sonia Allen advised her that her surgery would not be rescheduled as the bottom of the surgery letter states if you no show you may not be rescheduled. Advised patient to contact the office to make an appointment to discuss other birth control options. Patient expressed understanding.  Patient was notified of surgery date and time on 7/19, had an office appointment on 08/17 to be consulted and examined by the provider. Surgery was scheduled for 08/17 @0730 .

## 2020-02-16 NOTE — Telephone Encounter (Signed)
Called Patient LVM advising that we were calling for information for her visit and to give the office a call back.

## 2020-02-16 NOTE — Telephone Encounter (Signed)
-----   Message from Griffin Basil, MD sent at 02/15/2020  8:03 AM EDT ----- Regarding: no show for surgery Please contact pt regarding no show for surgery.  Would she like some other form of contraception?

## 2020-02-18 ENCOUNTER — Ambulatory Visit: Payer: Medicaid Other | Admitting: Medical

## 2020-03-15 ENCOUNTER — Ambulatory Visit: Payer: Medicaid Other | Admitting: Obstetrics and Gynecology

## 2020-04-25 ENCOUNTER — Inpatient Hospital Stay (HOSPITAL_COMMUNITY)
Admission: AD | Admit: 2020-04-25 | Discharge: 2020-04-25 | Disposition: A | Discharge status: Home / Self Care | Payer: Medicaid Other | Attending: Obstetrics & Gynecology | Admitting: Obstetrics & Gynecology

## 2020-04-25 ENCOUNTER — Other Ambulatory Visit: Payer: Self-pay

## 2020-04-25 DIAGNOSIS — N390 Urinary tract infection, site not specified: Secondary | ICD-10-CM

## 2020-04-25 DIAGNOSIS — R319 Hematuria, unspecified: Secondary | ICD-10-CM | POA: Diagnosis not present

## 2020-04-25 LAB — URINALYSIS, ROUTINE W REFLEX MICROSCOPIC
Bilirubin Urine: NEGATIVE
Glucose, UA: NEGATIVE mg/dL
Ketones, ur: NEGATIVE mg/dL
Nitrite: NEGATIVE
Protein, ur: NEGATIVE mg/dL
Specific Gravity, Urine: 1.024 (ref 1.005–1.030)
pH: 5 (ref 5.0–8.0)

## 2020-04-25 MED ORDER — NITROFURANTOIN MONOHYD MACRO 100 MG PO CAPS
100.0000 mg | ORAL_CAPSULE | Freq: Two times a day (BID) | ORAL | 1 refills | Status: DC
Start: 2020-04-25 — End: 2020-11-05

## 2020-04-25 NOTE — MAU Provider Note (Addendum)
History      CSN: [id number]  Arrival date and time: 04/25/20 0941   First Provider Initiated Contact with Patient 04/25/20 1059      Chief Complaint  Patient presents with  . Hematuria   HPI 34 year old female presents to the MAU today with chief complaint of hematuria accompanied by suprapubic pain. Onset yesterday 04/24/2020. She states that she first noticed bright red blood with wiping only after urination. She endorses dysuria and urinary frequency. She has felt some nausea and fatigue. She says these symptoms feel more severe than her past UTIs. She denies fever, chills, or recent illness.   She does not believe she is pregnant and has not taken a home pregnancy test.  Pertinent Gynecological History: Menses: irregular occurring approximately every 30 days without intermenstrual spotting   Past Medical History:  Diagnosis Date  . Anemia    first and sec pregnancies  . Arrhythmia   . Chlamydia   . Gonorrhea   . Headache(784.0)   . Hemorrhoids   . Hx of migraine headaches   . Leiomyoma of uterus in pregnancy     Past Surgical History:  Procedure Laterality Date  . INDUCED ABORTION  March  2 ,2013  . WISDOM TOOTH EXTRACTION      Family History  Problem Relation Age of Onset  . Healthy Mother   . Healthy Father   . Other Neg Hx   . Alcohol abuse Neg Hx   . Arthritis Neg Hx   . Asthma Neg Hx   . Birth defects Neg Hx   . Cancer Neg Hx   . COPD Neg Hx   . Depression Neg Hx   . Diabetes Neg Hx   . Drug abuse Neg Hx   . Early death Neg Hx   . Heart disease Neg Hx   . Hearing loss Neg Hx   . Hyperlipidemia Neg Hx   . Hypertension Neg Hx   . Kidney disease Neg Hx   . Learning disabilities Neg Hx   . Mental illness Neg Hx   . Mental retardation Neg Hx   . Miscarriages / Stillbirths Neg Hx   . Stroke Neg Hx   . Vision loss Neg Hx     Social History   Tobacco Use  . Smoking status: Never Smoker  . Smokeless tobacco: Never Used  Vaping Use  .  Vaping Use: Never used  Substance Use Topics  . Alcohol use: Not Currently    Comment: Pt reports socially  . Drug use: Not Currently    Frequency: 14.0 times per week    Types: Marijuana    Comment: Reports not currently smoking    Allergies:  Allergies  Allergen Reactions  . Latex Rash    Burning sensation    Medications Prior to Admission  Medication Sig Dispense Refill Last Dose  . fluticasone (FLONASE) 50 MCG/ACT nasal spray Place 1 spray into both nostrils as needed for allergies. 16 g 0   . ibuprofen (ADVIL) 600 MG tablet Take 1 tablet (600 mg total) by mouth every 6 (six) hours as needed. 30 tablet 0     Review of Systems  Constitutional: Positive for fatigue. Negative for chills and fever.  Gastrointestinal: Positive for abdominal pain and nausea. Negative for vomiting.  Genitourinary: Positive for difficulty urinating, dysuria, frequency, hematuria, pelvic pain and urgency. Negative for flank pain, vaginal bleeding and vaginal pain.  Musculoskeletal: Negative for back pain.   Physical Exam   Blood  pressure 122/74, pulse 87, temperature 99 F (37.2 C), resp. rate 18, is not currently breastfeeding.     Physical Exam Cardiovascular:     Rate and Rhythm: Normal rate and regular rhythm.     Pulses: Normal pulses.     Heart sounds: Normal heart sounds. No murmur heard.  No friction rub. No gallop.   Pulmonary:     Breath sounds: Normal breath sounds. No stridor. No wheezing or rales.  Abdominal:     Tenderness: There is no right CVA tenderness or left CVA tenderness.     MAU Course  Procedures   --Pertinent negatives: flank pain, CVAT, fever, abdominal tenderness  Patient Vitals for the past 24 hrs:  BP Temp Pulse Resp  04/25/20 1035 122/74 99 F (37.2 C) 87 18   Results for orders placed or performed during the hospital encounter of 04/25/20 (from the past 24 hour(s))  Urinalysis, Routine w reflex microscopic     Status: Abnormal   Collection Time:  04/25/20 10:55 AM  Result Value Ref Range   Color, Urine YELLOW YELLOW   APPearance HAZY (A) CLEAR   Specific Gravity, Urine 1.024 1.005 - 1.030   pH 5.0 5.0 - 8.0   Glucose, UA NEGATIVE NEGATIVE mg/dL   Hgb urine dipstick MODERATE (A) NEGATIVE   Bilirubin Urine NEGATIVE NEGATIVE   Ketones, ur NEGATIVE NEGATIVE mg/dL   Protein, ur NEGATIVE NEGATIVE mg/dL   Nitrite NEGATIVE NEGATIVE   Leukocytes,Ua LARGE (A) NEGATIVE   RBC / HPF 11-20 0 - 5 RBC/hpf   WBC, UA 21-50 0 - 5 WBC/hpf   Bacteria, UA RARE (A) NONE SEEN   Squamous Epithelial / LPF 0-5 0 - 5   Mucus PRESENT    Meds ordered this encounter  Medications  . nitrofurantoin, macrocrystal-monohydrate, (MACROBID) 100 MG capsule    Sig: Take 1 capsule (100 mg total) by mouth 2 (two) times daily.    Dispense:  14 capsule    Refill:  1    Order Specific Question:   Supervising Provider    Answer:   Woodroe Mode [[id number]]   Assessment and Plan  --34 y.o. [phone] non-pregnant patient, not breastfeeding --UTI --New rx [place] --Discharge home in stable condition  F/U: --Discussed scope of MAU --Advised follow-up with office or urgent care PRN  Mallie Snooks, MSN, CNM Certified Nurse Midwife, [facility] for [employer], [place] 04/25/20 1:23 PM

## 2020-04-25 NOTE — Discharge Instructions (Signed)
Hematuria, Adult Hematuria is blood in the urine. Blood may be visible in the urine, or it may be identified with a test. This condition can be caused by infections of the bladder, urethra, kidney, or prostate. Other possible causes include:  Kidney stones.  Cancer of the urinary tract.  Too much calcium in the urine.  Conditions that are passed from parent to child (inherited conditions).  Exercise that requires a lot of energy. Infections can usually be treated with medicine, and a kidney stone usually will pass through your urine. If neither of these is the cause of your hematuria, more tests may be needed to identify the cause of your symptoms. It is very important to tell your health care provider about any blood in your urine, even if it is painless or the blood stops without treatment. Blood in the urine, when it happens and then stops and then happens again, can be a symptom of a very serious condition, including cancer. There is no pain in the initial stages of many urinary cancers. Follow these instructions at home: Medicines  Take over-the-counter and prescription medicines only as told by your health care provider.  If you were prescribed an antibiotic medicine, take it as told by your health care provider. Do not stop taking the antibiotic even if you start to feel better. Eating and drinking  Drink enough fluid to keep your urine clear or pale yellow. It is recommended that you drink 3-4 quarts (2.8-3.8 L) a day. If you have been diagnosed with an infection, it is recommended that you drink cranberry juice in addition to large amounts of water.  Avoid caffeine, tea, and carbonated beverages. These tend to irritate the bladder.  Avoid alcohol because it may irritate the prostate (men). General instructions  If you have been diagnosed with a kidney stone, follow your health care provider's instructions about straining your urine to catch the stone.  Empty your bladder  often. Avoid holding urine for long periods of time.  If you are female: ? After a bowel movement, wipe from front to back and use each piece of toilet paper only once. ? Empty your bladder before and after sex.  Pay attention to any changes in your symptoms. Tell your health care provider about any changes or any new symptoms.  It is your responsibility to get your test results. Ask your health care provider, or the department performing the test, when your results will be ready.  Keep all follow-up visits as told by your health care provider. This is important. Contact a health care provider if:  You develop back pain.  You have a fever.  You have nausea or vomiting.  Your symptoms do not improve after 3 days.  Your symptoms get worse. Get help right away if:  You develop severe vomiting and are unable take medicine without vomiting.  You develop severe pain in your back or abdomen even though you are taking medicine.  You pass a large amount of blood in your urine.  You pass blood clots in your urine.  You feel very weak or like you might faint.  You faint. Summary  Hematuria is blood in the urine. It has many possible causes.  It is very important that you tell your health care provider about any blood in your urine, even if it is painless or the blood stops without treatment.  Take over-the-counter and prescription medicines only as told by your health care provider.  Drink enough fluid to keep   your urine clear or pale yellow. This information is not intended to replace advice given to you by your health care provider. Make sure you discuss any questions you have with your health care provider. Document Revised: 11/11/2018 Document Reviewed: 07/20/2016 Elsevier Patient Education  2020 Elsevier Inc.  

## 2020-04-25 NOTE — MAU Note (Signed)
Pt reports she started having pain with urination yesterday. Noticed some blood when wiping. Thinks its coming from urethra not vaginal bleeding. Feels weak and cold.

## 2020-07-01 NOTE — L&D Delivery Note (Signed)
[facility]N [facility] Delivery Note  Sonia Jesperson is a 35 y.o. [id number] at [redacted]w[redacted]d She was admitted for SOL.   ROM: 1h 411mith clear fluid GBS Status: neg Maximum Maternal Temperature: 98.4*F  Labor Progress: Patient presented in active labor and progressed to complete  Delivery Date/Time: 02/14/21 at  Delivery: Called to room and patient was complete and pushing. Head delivered LOA. No nuchal cord present. Shoulder and body delivered in usual fashion. Infant with spontaneous cry, placed on mother's abdomen, dried and stimulated. Cord clamped x 2 after 1-minute delay, and cut by FOB. Cord blood drawn. Placenta delivered spontaneously with gentle cord traction. Fundus firm with massage and Pitocin. Labia, perineum, vagina, and cervix inspected with no lacerations.   Placenta: 3VC, intact Complications: none Lacerations: none EBL: 50 cc Analgesia: epidural  Postpartum Planning '[ ]'$  message to sent to schedule follow-up  '[ ]'$  vaccines UTD  Infant: viable female  APGARs 9,9  weight pending medical chart  CaGladys DammeMD [place]-3 02/14/2021, 5:14 PM

## 2020-11-02 ENCOUNTER — Ambulatory Visit
Admission: EM | Admit: 2020-11-02 | Discharge: 2020-11-02 | Disposition: A | Discharge status: Home / Self Care | Payer: Medicaid Other | Attending: Family Medicine | Admitting: Family Medicine

## 2020-11-02 ENCOUNTER — Encounter: Payer: Self-pay | Admitting: *Deleted

## 2020-11-02 ENCOUNTER — Other Ambulatory Visit: Payer: Self-pay

## 2020-11-02 DIAGNOSIS — Z3A25 25 weeks gestation of pregnancy: Secondary | ICD-10-CM | POA: Diagnosis not present

## 2020-11-02 DIAGNOSIS — O0932 Supervision of pregnancy with insufficient antenatal care, second trimester: Secondary | ICD-10-CM | POA: Diagnosis not present

## 2020-11-02 DIAGNOSIS — R42 Dizziness and giddiness: Secondary | ICD-10-CM | POA: Diagnosis present

## 2020-11-02 DIAGNOSIS — Z3A26 26 weeks gestation of pregnancy: Secondary | ICD-10-CM | POA: Diagnosis not present

## 2020-11-02 DIAGNOSIS — Z3201 Encounter for pregnancy test, result positive: Secondary | ICD-10-CM | POA: Diagnosis not present

## 2020-11-02 LAB — POCT URINALYSIS DIP (MANUAL ENTRY)
Bilirubin, UA: NEGATIVE
Glucose, UA: NEGATIVE mg/dL
Nitrite, UA: NEGATIVE
Protein Ur, POC: 30 mg/dL — AB
Spec Grav, UA: >=1.03 — AB (ref 1.010–1.025)
Urobilinogen, UA: 0.2 E.U./dL
pH, UA: 6 (ref 5.0–8.0)

## 2020-11-02 LAB — POCT URINE PREGNANCY: Preg Test, Ur: POSITIVE — AB

## 2020-11-02 NOTE — ED Triage Notes (Signed)
PT reports her last menses was in November 2021. Pt is not sure if she is pregnant .

## 2020-11-02 NOTE — ED Provider Notes (Signed)
[name]    CSN: [id number] Arrival date & time: 11/02/20  1755      History   Chief Complaint Chief Complaint  Patient presents with  . cramps hands  . cramps legs  . Nausea    HPI Sonia Allen is a 35 y.o. female.   HPI  Patient presents today for generalized fatigue, weakness occasional dizziness and no period since November 2021. Patient reports that she thought she may be pregnant but was uncertain and unsure of when she became pregnant. She reports fatigue. She appears visibly pregnant. No period or spotting since November.   Past Medical History:  Diagnosis Date  . Anemia    first and sec pregnancies  . Arrhythmia   . Chlamydia   . Gonorrhea   . Headache(784.0)   . Hemorrhoids   . Hx of migraine headaches   . Leiomyoma of uterus in pregnancy     Patient Active Problem List   Diagnosis Date Noted  . Encounter for postpartum visit 02/14/2020  . Birth control counseling 02/14/2020  . Encounter for induction of labor 01/09/2020  . Uterine fibroid complicating antenatal care, baby not yet delivered in second trimester 08/23/2019  . Low TSH level 07/17/2019  . Asymptomatic bacteriuria during pregnancy in first trimester 07/09/2019  . Maternal atypical antibody 07/09/2019  . Proteinuria 07/03/2019  . Supervision of high risk pregnancy, antepartum, second trimester 06/18/2019  . Fibroids 06/18/2019  . Tooth pain 06/18/2019  . Obesity in pregnancy 06/18/2019  . Gestational hypertension 12/17/2017  . Marijuana use 06/16/2017    Past Surgical History:  Procedure Laterality Date  . INDUCED ABORTION  March  2 ,2013  . WISDOM TOOTH EXTRACTION      OB History    Gravida  9   Para  5   Term  5   Preterm  0   AB  3   Living  5     SAB  1   IAB  2   Ectopic  0   Multiple  0   Live Births  5            Home Medications    Prior to Admission medications   Medication Sig Start Date End Date Taking? Authorizing Provider   fluticasone (FLONASE) 50 MCG/ACT nasal spray Place 1 spray into both nostrils as needed for allergies. 12/13/19   Cherre Blanc, MD  nitrofurantoin, macrocrystal-monohydrate, (MACROBID) 100 MG capsule Take 1 capsule (100 mg total) by mouth 2 (two) times daily. 04/25/20   Darlina Rumpf, CNM    Family History Family History  Problem Relation Age of Onset  . Healthy Mother   . Healthy Father   . Other Neg Hx   . Alcohol abuse Neg Hx   . Arthritis Neg Hx   . Asthma Neg Hx   . Birth defects Neg Hx   . Cancer Neg Hx   . COPD Neg Hx   . Depression Neg Hx   . Diabetes Neg Hx   . Drug abuse Neg Hx   . Early death Neg Hx   . Heart disease Neg Hx   . Hearing loss Neg Hx   . Hyperlipidemia Neg Hx   . Hypertension Neg Hx   . Kidney disease Neg Hx   . Learning disabilities Neg Hx   . Mental illness Neg Hx   . Mental retardation Neg Hx   . Miscarriages / Stillbirths Neg Hx   . Stroke Neg Hx   .  Vision loss Neg Hx     Social History Social History   Tobacco Use  . Smoking status: Never Smoker  . Smokeless tobacco: Never Used  Vaping Use  . Vaping Use: Never used  Substance Use Topics  . Alcohol use: Not Currently    Comment: Pt reports socially  . Drug use: Not Currently    Frequency: 14.0 times per week    Types: Marijuana    Comment: Reports not currently smoking     Allergies   Latex   Review of Systems Review of Systems Pertinent negatives listed in HPI   Physical Exam Triage Vital Signs ED Triage Vitals  Enc Vitals Group     BP 11/02/20 1855 109/74     Pulse Rate 11/02/20 1855 94     Resp 11/02/20 1855 16     Temp 11/02/20 1855 98.8 F (37.1 C)     Temp Source 11/02/20 1855 Oral     SpO2 11/02/20 1855 98 %     Weight --      Height --      Head Circumference --      Peak Flow --      Pain Score 11/02/20 1853 4     Pain Loc --      Pain Edu? --      Excl. in [place]? --    No data found.  Updated Vital Signs BP 109/74 (BP Location: Left  Arm)   Pulse 94   Temp 98.8 F (37.1 C) (Oral)   Resp 16   LMP 05/05/2020   SpO2 98%   Visual Acuity Right Eye Distance:   Left Eye Distance:   Bilateral Distance:    Right Eye Near:   Left Eye Near:    Bilateral Near:     Physical Exam General appearance: alert, well developed, well nourished, cooperative and in no distress Head: Normocephalic, without obvious abnormality, atraumatic Respiratory: Respirations even and unlabored, normal respiratory rate Heart: Rate and rhythm normal. No gallop or murmurs noted on exam  Abdomen BS +, proturbent w/ distention consistent of with pregnancy, no rebound tenderness, or no mass Extremities: No gross deformities Skin: Skin color, texture, turgor normal. No rashes seen  Psych: Appropriate mood and affect.   UC Treatments / Results  Labs (all labs ordered are listed, but only abnormal results are displayed) Labs Reviewed  POCT URINALYSIS DIP (MANUAL ENTRY) - Abnormal; Notable for the following components:      Result Value   Clarity, UA cloudy (*)    Ketones, POC UA trace (5) (*)    Spec Grav, UA >=1.030 (*)    Blood, UA small (*)    Protein Ur, POC =30 (*)    Leukocytes, UA Trace (*)    All other components within normal limits  POCT URINE PREGNANCY - Abnormal; Notable for the following components:   Preg Test, Ur Positive (*)    All other components within normal limits    EKG   Radiology No results found.  Procedures Procedures (including critical care time)  Medications Ordered in UC Medications - No data to display  Initial Impression / Assessment and Plan / UC Course  I have reviewed the triage vital signs and the nursing notes.  Pertinent labs & imaging results that were available during my care of the patient were reviewed by me and considered in my medical decision making (see chart for details).     Urine HCG positive for pregnancy.  Based on  last menstrual period patient is approximately [redacted] weeks  pregnant. Patient given information to get established with [place] [facility]. Women's MAU unit if any of her symptoms worsen prior to OBGYN follow-up Final Clinical Impressions(s) / UC Diagnoses   Final diagnoses:  [redacted] weeks gestation of pregnancy  No prenatal care in current pregnancy in second trimester   Discharge Instructions   None    ED Prescriptions    None     PDMP not reviewed this encounter.   Scot Jun, FNP 11/05/20 1946

## 2020-11-02 NOTE — ED Triage Notes (Signed)
Pt re[ports this is the third day she has felt cramps in both hands and both legs.

## 2020-11-05 ENCOUNTER — Inpatient Hospital Stay (HOSPITAL_COMMUNITY)
Admission: AD | Admit: 2020-11-05 | Discharge: 2020-11-05 | Disposition: A | Discharge status: Home / Self Care | Payer: Medicaid Other | Attending: Obstetrics & Gynecology | Admitting: Obstetrics & Gynecology

## 2020-11-05 ENCOUNTER — Other Ambulatory Visit: Payer: Self-pay

## 2020-11-05 ENCOUNTER — Encounter (HOSPITAL_COMMUNITY): Payer: Self-pay | Admitting: Obstetrics & Gynecology

## 2020-11-05 DIAGNOSIS — Z3A26 26 weeks gestation of pregnancy: Secondary | ICD-10-CM | POA: Diagnosis not present

## 2020-11-05 DIAGNOSIS — O0932 Supervision of pregnancy with insufficient antenatal care, second trimester: Secondary | ICD-10-CM | POA: Insufficient documentation

## 2020-11-05 DIAGNOSIS — D649 Anemia, unspecified: Secondary | ICD-10-CM

## 2020-11-05 DIAGNOSIS — O99012 Anemia complicating pregnancy, second trimester: Secondary | ICD-10-CM | POA: Diagnosis not present

## 2020-11-05 LAB — CBC
HCT: 31.2 % — ABNORMAL LOW (ref 36.0–46.0)
Hemoglobin: 10.4 g/dL — ABNORMAL LOW (ref 12.0–15.0)
MCH: 29.7 pg (ref 26.0–34.0)
MCHC: 33.3 g/dL (ref 30.0–36.0)
MCV: 89.1 fL (ref 80.0–100.0)
Platelets: 270 10*3/uL (ref 150–400)
RBC: 3.5 MIL/uL — ABNORMAL LOW (ref 3.87–5.11)
RDW: 12.7 % (ref 11.5–15.5)
WBC: 8.1 10*3/uL (ref 4.0–10.5)
nRBC: 0 % (ref 0.0–0.2)

## 2020-11-05 LAB — DIFFERENTIAL
Abs Immature Granulocytes: 0.03 10*3/uL (ref 0.00–0.07)
Basophils Absolute: 0 10*3/uL (ref 0.0–0.1)
Basophils Relative: 0 %
Eosinophils Absolute: 0.1 10*3/uL (ref 0.0–0.5)
Eosinophils Relative: 1 %
Immature Granulocytes: 0 %
Lymphocytes Relative: 19 %
Lymphs Abs: 1.5 10*3/uL (ref 0.7–4.0)
Monocytes Absolute: 0.2 10*3/uL (ref 0.1–1.0)
Monocytes Relative: 3 %
Neutro Abs: 6.3 10*3/uL (ref 1.7–7.7)
Neutrophils Relative %: 77 %

## 2020-11-05 LAB — TYPE AND SCREEN
ABO/RH(D): B POS
Antibody Screen: NEGATIVE

## 2020-11-05 LAB — HEPATITIS B SURFACE ANTIGEN: Hepatitis B Surface Ag: NONREACTIVE

## 2020-11-05 LAB — HIV ANTIBODY (ROUTINE TESTING W REFLEX): HIV Screen 4th Generation wRfx: NONREACTIVE

## 2020-11-05 MED ORDER — FERROUS SULFATE 325 (65 FE) MG PO TABS: 325.0000 mg | ORAL_TABLET | Freq: Every day | ORAL | 1 refills | Status: DC

## 2020-11-05 MED ORDER — ONDANSETRON 4 MG PO TBDP
8.0000 mg | ORAL_TABLET | Freq: Once | ORAL | Status: AC
  Administered 2020-11-05: 8 mg via ORAL
  Filled 2020-11-05: qty 2

## 2020-11-05 MED ORDER — DOCUSATE SODIUM 250 MG PO CAPS: 250.0000 mg | ORAL_CAPSULE | Freq: Every day | ORAL | 1 refills | Status: DC

## 2020-11-05 MED ORDER — ONDANSETRON 4 MG PO TBDP: 4.0000 mg | ORAL_TABLET | Freq: Three times a day (TID) | ORAL | 1 refills | Status: DC | PRN

## 2020-11-05 MED ORDER — PROMETHAZINE HCL 25 MG PO TABS: 25.0000 mg | ORAL_TABLET | Freq: Four times a day (QID) | ORAL | 1 refills | Status: DC | PRN

## 2020-11-05 MED ORDER — FAMOTIDINE 20 MG PO TABS: 20.0000 mg | ORAL_TABLET | Freq: Two times a day (BID) | ORAL | 1 refills | Status: DC

## 2020-11-05 NOTE — MAU Note (Signed)
Pt reports to mau with c/o dizziness, weakness and swelling of hands for the past week. Pt reports she found out she was preg in late December 2021, but has not had prenatal care and is unsure of how far along she is.  LMP 05/05/20.  Denies LOF or ctx.

## 2020-11-05 NOTE — MAU Note (Signed)
Unable to maintain continuous fetal heart rate tracing due to fetal activity

## 2020-11-05 NOTE — Discharge Instructions (Signed)
Safe Medications in Pregnancy   Acne: Benzoyl Peroxide Salicylic Acid  Backache/Headache: Tylenol: 2 regular strength every 4 hours OR              2 Extra strength every 6 hours  Colds/Coughs/Allergies: Benadryl (alcohol free) 25 mg every 6 hours as needed Breath right strips Claritin Cepacol throat lozenges Chloraseptic throat spray Cold-Eeze- up to three times per day Cough drops, alcohol free Flonase (by prescription only) Guaifenesin Mucinex Robitussin DM (plain only, alcohol free) Saline nasal spray/drops Sudafed (pseudoephedrine) & Actifed ** use only after [redacted] weeks gestation and if you do not have high blood pressure Tylenol Vicks Vaporub Zinc lozenges Zyrtec   Constipation: Colace Ducolax suppositories Fleet enema Glycerin suppositories Metamucil Milk of magnesia Miralax Senokot Smooth move tea  Diarrhea: Kaopectate Imodium A-D  *NO pepto Bismol  Hemorrhoids: Anusol Anusol HC Preparation H Tucks  Indigestion: Tums Maalox Mylanta Zantac  Pepcid  Insomnia: Benadryl (alcohol free) 25mg  every 6 hours as needed Tylenol PM Unisom, no Gelcaps  Leg Cramps: Tums MagGel  Nausea/Vomiting:  Bonine Dramamine Emetrol Ginger extract Sea bands Meclizine  Nausea medication to take during pregnancy:  Unisom (doxylamine succinate 25 mg tablets) Take one tablet daily at bedtime. If symptoms are not adequately controlled, the dose can be increased to a maximum recommended dose of two tablets daily (1/2 tablet in the morning, 1/2 tablet mid-afternoon and one at bedtime). Vitamin B6 100mg  tablets. Take one tablet twice a day (up to 200 mg per day).  Skin Rashes: Aveeno products Benadryl cream or 25mg  every 6 hours as needed Calamine Lotion 1% cortisone cream  Yeast infection: Gyne-lotrimin 7 Monistat 7   **If taking multiple medications, please check labels to avoid duplicating the same active ingredients **take medication as directed on  the label ** Do not exceed 4000 mg of tylenol in 24 hours **Do not take medications that contain aspirin or ibuprofen     Second Trimester of Pregnancy  The second trimester of pregnancy is from week 13 through week 27. This is months 4 through 6 of pregnancy. The second trimester is often a time when you feel your best. Your body has adjusted to being pregnant, and you begin to feel better physically. During the second trimester:  Morning sickness has lessened or stopped completely.  You may have more energy.  You may have an increase in appetite. The second trimester is also a time when the unborn baby (fetus) is growing rapidly. At the end of the sixth month, the fetus may be up to 12 inches long and weigh about 1 pounds. You will likely begin to feel the baby move (quickening) between 16 and 20 weeks of pregnancy. Body changes during your second trimester Your body continues to go through many changes during your second trimester. The changes vary and generally return to normal after the baby is born. Physical changes  Your weight will continue to increase. You will notice your lower abdomen bulging out.  You may begin to get stretch marks on your hips, abdomen, and breasts.  Your breasts will continue to grow and to become tender.  Dark spots or blotches (chloasma or mask of pregnancy) may develop on your face.  A dark line from your belly button to the pubic area (linea nigra) may appear.  You may have changes in your hair. These can include thickening of your hair, rapid growth, and changes in texture. Some people also have hair loss during or after pregnancy, or hair that  feels dry or thin. Health changes  You may develop headaches.  You may have heartburn.  You may develop constipation.  You may develop hemorrhoids or swollen, bulging veins (varicose veins).  Your gums may bleed and may be sensitive to brushing and flossing.  You may urinate more often because  the fetus is pressing on your bladder.  You may have back pain. This is caused by: ? Weight gain. ? Pregnancy hormones that are relaxing the joints in your pelvis. ? A shift in weight and the muscles that support your balance. Follow these instructions at home: Medicines  Follow your health care provider's instructions regarding medicine use. Specific medicines may be either safe or unsafe to take during pregnancy. Do not take any medicines unless approved by your health care provider.  Take a prenatal vitamin that contains at least 600 micrograms (mcg) of folic acid. Eating and drinking  Eat a healthy diet that includes fresh fruits and vegetables, whole grains, good sources of protein such as meat, eggs, or tofu, and low-fat dairy products.  Avoid raw meat and unpasteurized juice, milk, and cheese. These carry germs that can harm you and your baby.  You may need to take these actions to prevent or treat constipation: ? Drink enough fluid to keep your urine pale yellow. ? Eat foods that are high in fiber, such as beans, whole grains, and fresh fruits and vegetables. ? Limit foods that are high in fat and processed sugars, such as fried or sweet foods. Activity  Exercise only as directed by your health care provider. Most people can continue their usual exercise routine during pregnancy. Try to exercise for 30 minutes at least 5 days a week. Stop exercising if you develop contractions in your uterus.  Stop exercising if you develop pain or cramping in the lower abdomen or lower back.  Avoid exercising if it is very hot or humid or if you are at a high altitude.  Avoid heavy lifting.  If you choose to, you may have sex unless your health care provider tells you not to. Relieving pain and discomfort  Wear a supportive bra to prevent discomfort from breast tenderness.  Take warm sitz baths to soothe any pain or discomfort caused by hemorrhoids. Use hemorrhoid cream if your health  care provider approves.  Rest with your legs raised (elevated) if you have leg cramps or low back pain.  If you develop varicose veins: ? Wear support hose as told by your health care provider. ? Elevate your feet for 15 minutes, 3-4 times a day. ? Limit salt in your diet. Safety  Wear your seat belt at all times when driving or riding in a car.  Talk with your health care provider if someone is verbally or physically abusive to you. Lifestyle  Do not use hot tubs, steam rooms, or saunas.  Do not douche. Do not use tampons or scented sanitary pads.  Avoid cat litter boxes and soil used by cats. These carry germs that can cause birth defects in the baby and possibly loss of the fetus by miscarriage or stillbirth.  Do not use herbal remedies, alcohol, illegal drugs, or medicines that are not approved by your health care provider. Chemicals in these products can harm your baby.  Do not use any products that contain nicotine or tobacco, such as cigarettes, e-cigarettes, and chewing tobacco. If you need help quitting, ask your health care provider. General instructions  During a routine prenatal visit, your health care provider  will do a physical exam and other tests. He or she will also discuss your overall health. Keep all follow-up visits. This is important.  Ask your health care provider for a referral to a local prenatal education class.  Ask for help if you have counseling or nutritional needs during pregnancy. Your health care provider can offer advice or refer you to specialists for help with various needs. Where to find more information  American Pregnancy Association: americanpregnancy.[place] and Gynecologists: PoolDevices.com.pt  Office on [employer]: KeywordPortfolios.com.br Contact a health care provider if you have:  A headache that does not go away when you take medicine.  Vision changes or you see spots in  front of your eyes.  Mild pelvic cramps, pelvic pressure, or nagging pain in the abdominal area.  Persistent nausea, vomiting, or diarrhea.  A bad-smelling vaginal discharge or foul-smelling urine.  Pain when you urinate.  Sudden or extreme swelling of your face, hands, ankles, feet, or legs.  A fever. Get help right away if you:  Have fluid leaking from your vagina.  Have spotting or bleeding from your vagina.  Have severe abdominal cramping or pain.  Have difficulty breathing.  Have chest pain.  Have fainting spells.  Have not felt your baby move for the time period told by your health care provider.  Have new or increased pain, swelling, or redness in an arm or leg. Summary  The second trimester of pregnancy is from week 13 through week 27 (months 4 through 6).  Do not use herbal remedies, alcohol, illegal drugs, or medicines that are not approved by your health care provider. Chemicals in these products can harm your baby.  Exercise only as directed by your health care provider. Most people can continue their usual exercise routine during pregnancy.  Keep all follow-up visits. This is important. This information is not intended to replace advice given to you by your health care provider. Make sure you discuss any questions you have with your health care provider. Document Revised: 11/24/2019 Document Reviewed: 09/30/2019 Elsevier Patient Education  2021 [employer].

## 2020-11-05 NOTE — MAU Provider Note (Signed)
History     CSN: [id number]  Arrival date and time: 11/05/20 1657   Event Date/Time   First Provider Initiated Contact with Patient 11/05/20 1737      Chief Complaint  Patient presents with  . Fatigue  . Dizziness   HPI Sonia Allen is a 35 y.o. [id number] at [redacted]w[redacted]d who presents with feeling generally unwell. She states she has been feeling fatigued and dizziness for several weeks. She states she found out she was pregnant in December and she has been unable to establish prenatal care. She also reports ongoing nausea but no vomiting. She denies any abdominal pain, vaginal bleeding or discharge. Reports feeling fetal movement.   OB History    Gravida  10   Para  5   Term  5   Preterm  0   AB  3   Living  5     SAB  1   IAB  2   Ectopic  0   Multiple  0   Live Births  5           Past Medical History:  Diagnosis Date  . Anemia    first and sec pregnancies  . Arrhythmia   . Chlamydia   . Gonorrhea   . Headache(784.0)   . Hemorrhoids   . Hx of migraine headaches   . Leiomyoma of uterus in pregnancy     Past Surgical History:  Procedure Laterality Date  . INDUCED ABORTION  March  2 ,2013  . WISDOM TOOTH EXTRACTION      Family History  Problem Relation Age of Onset  . Healthy Mother   . Healthy Father   . Other Neg Hx   . Alcohol abuse Neg Hx   . Arthritis Neg Hx   . Asthma Neg Hx   . Birth defects Neg Hx   . Cancer Neg Hx   . COPD Neg Hx   . Depression Neg Hx   . Diabetes Neg Hx   . Drug abuse Neg Hx   . Early death Neg Hx   . Heart disease Neg Hx   . Hearing loss Neg Hx   . Hyperlipidemia Neg Hx   . Hypertension Neg Hx   . Kidney disease Neg Hx   . Learning disabilities Neg Hx   . Mental illness Neg Hx   . Mental retardation Neg Hx   . Miscarriages / Stillbirths Neg Hx   . Stroke Neg Hx   . Vision loss Neg Hx     Social History   Tobacco Use  . Smoking status: Never Smoker  . Smokeless tobacco: Never Used  Vaping Use  .  Vaping Use: Never used  Substance Use Topics  . Alcohol use: Not Currently    Comment: Pt reports socially  . Drug use: Not Currently    Frequency: 14.0 times per week    Types: Marijuana    Comment: Reports not currently smoking    Allergies:  Allergies  Allergen Reactions  . Latex Rash    Burning sensation    Medications Prior to Admission  Medication Sig Dispense Refill Last Dose  . fluticasone (FLONASE) 50 MCG/ACT nasal spray Place 1 spray into both nostrils as needed for allergies. 16 g 0   . nitrofurantoin, macrocrystal-monohydrate, (MACROBID) 100 MG capsule Take 1 capsule (100 mg total) by mouth 2 (two) times daily. 14 capsule 1     Review of Systems  Constitutional: Positive for fatigue. Negative for fever.  HENT:  Negative.   Respiratory: Negative.  Negative for shortness of breath.   Cardiovascular: Negative.  Negative for chest pain.  Gastrointestinal: Positive for nausea. Negative for abdominal pain, constipation, diarrhea and vomiting.  Genitourinary: Negative.  Negative for dysuria.  Neurological: Positive for dizziness. Negative for headaches.   Physical Exam   Blood pressure 124/60, pulse 93, temperature 98.2 F (36.8 C), temperature source Oral, resp. rate 18, last menstrual period 05/05/2020, SpO2 100 %, unknown if currently breastfeeding.  Physical Exam Vitals and nursing note reviewed.  Constitutional:      General: She is not in acute distress.    Appearance: She is well-developed.  HENT:     Head: Normocephalic.  Eyes:     Pupils: Pupils are equal, round, and reactive to light.  Cardiovascular:     Rate and Rhythm: Normal rate and regular rhythm.     Heart sounds: Normal heart sounds.  Pulmonary:     Effort: Pulmonary effort is normal. No respiratory distress.     Breath sounds: Normal breath sounds.  Abdominal:     General: Bowel sounds are normal. There is no distension.     Palpations: Abdomen is soft.     Tenderness: There is no  abdominal tenderness.  Skin:    General: Skin is warm and dry.  Neurological:     Mental Status: She is alert and oriented to person, place, and time.  Psychiatric:        Behavior: Behavior normal.        Thought Content: Thought content normal.        Judgment: Judgment normal.    Fetal Tracing:  Baseline: 150 Variability: moderate Accels: 10x10 Decels: occasional variables  Toco: none   MAU Course  Procedures Results for orders placed or performed during the hospital encounter of 11/05/20 (from the past 24 hour(s))  CBC     Status: Abnormal   Collection Time: 11/05/20  5:53 PM  Result Value Ref Range   WBC 8.1 4.0 - 10.5 K/uL   RBC 3.50 (L) 3.87 - 5.11 MIL/uL   Hemoglobin 10.4 (L) 12.0 - 15.0 g/dL   HCT 31.2 (L) 36.0 - 46.0 %   MCV 89.1 80.0 - 100.0 fL   MCH 29.7 26.0 - 34.0 pg   MCHC 33.3 30.0 - 36.0 g/dL   RDW 12.7 11.5 - 15.5 %   Platelets 270 150 - 400 K/uL   nRBC 0.0 0.0 - 0.2 %  Differential     Status: None   Collection Time: 11/05/20  5:53 PM  Result Value Ref Range   Neutrophils Relative % 77 %   Neutro Abs 6.3 1.7 - 7.7 K/uL   Lymphocytes Relative 19 %   Lymphs Abs 1.5 0.7 - 4.0 K/uL   Monocytes Relative 3 %   Monocytes Absolute 0.2 0.1 - 1.0 K/uL   Eosinophils Relative 1 %   Eosinophils Absolute 0.1 0.0 - 0.5 K/uL   Basophils Relative 0 %   Basophils Absolute 0.0 0.0 - 0.1 K/uL   Immature Granulocytes 0 %   Abs Immature Granulocytes 0.03 0.00 - 0.07 K/uL  Type and screen [place]     Status: None   Collection Time: 11/05/20  5:53 PM  Result Value Ref Range   ABO/RH(D) B POS    Antibody Screen NEG    Sample Expiration      11/08/2020,2359 Performed at [facility], [address], [place], [place] [address]    MDM  OB Panel Zofran PO  Assessment and Plan   1. Anemia during pregnancy in second trimester   2. [redacted] weeks gestation of pregnancy   3. No prenatal care in current pregnancy in second trimester     -Discharge home in stable condition -Rx for zofran, ferrous sulfate and colace sent to patient's pharmacy -Second trimester precautions discussed -Patient advised to follow-up with OB as scheduled on 5/18 -Order placed for outpatient anatomy scan  -Patient may return to MAU as needed or if her condition were to change or worsen   Wende Mott CNM 11/05/2020, 5:37 PM

## 2020-11-06 ENCOUNTER — Telehealth (HOSPITAL_COMMUNITY): Payer: Self-pay | Admitting: Emergency Medicine

## 2020-11-06 LAB — URINE CULTURE: Culture: 60000 — AB

## 2020-11-06 LAB — RPR: RPR Ser Ql: NONREACTIVE

## 2020-11-06 LAB — HEMOGLOBIN A1C
Hgb A1c MFr Bld: 5.6 % (ref 4.8–5.6)
Mean Plasma Glucose: 114.02 mg/dL

## 2020-11-06 MED ORDER — CEPHALEXIN 500 MG PO CAPS: 500.0000 mg | ORAL_CAPSULE | Freq: Four times a day (QID) | ORAL | 0 refills | Status: AC

## 2020-11-07 LAB — RUBELLA SCREEN: Rubella: 11.8 index (ref >0.99)

## 2020-11-15 ENCOUNTER — Other Ambulatory Visit: Payer: Self-pay

## 2020-11-15 ENCOUNTER — Other Ambulatory Visit (HOSPITAL_COMMUNITY)
Admission: RE | Admit: 2020-11-15 | Discharge: 2020-11-15 | Disposition: A | Discharge status: Home / Self Care | Payer: Medicaid Other | Source: Ambulatory Visit | Attending: Certified Nurse Midwife | Admitting: Certified Nurse Midwife

## 2020-11-15 ENCOUNTER — Encounter: Payer: Self-pay | Admitting: Certified Nurse Midwife

## 2020-11-15 ENCOUNTER — Ambulatory Visit (INDEPENDENT_AMBULATORY_CARE_PROVIDER_SITE_OTHER): Payer: Medicaid Other | Admitting: Certified Nurse Midwife

## 2020-11-15 VITALS — BP 103/64 | HR 86 | Wt 170.2 lb

## 2020-11-15 DIAGNOSIS — A599 Trichomoniasis, unspecified: Secondary | ICD-10-CM | POA: Diagnosis not present

## 2020-11-15 DIAGNOSIS — O23592 Infection of other part of genital tract in pregnancy, second trimester: Secondary | ICD-10-CM | POA: Insufficient documentation

## 2020-11-15 DIAGNOSIS — O0992 Supervision of high risk pregnancy, unspecified, second trimester: Secondary | ICD-10-CM | POA: Insufficient documentation

## 2020-11-15 DIAGNOSIS — O0990 Supervision of high risk pregnancy, unspecified, unspecified trimester: Secondary | ICD-10-CM | POA: Diagnosis not present

## 2020-11-15 DIAGNOSIS — Z3A24 24 weeks gestation of pregnancy: Secondary | ICD-10-CM | POA: Diagnosis not present

## 2020-11-15 MED ORDER — BLOOD PRESSURE KIT DEVI: 1.0000 | 0 refills | Status: DC | PRN

## 2020-11-15 NOTE — Progress Notes (Signed)
History:   Sonia Allen is a 35 y.o. [id number] at 29w6dby LMP being seen today for her first obstetrical visit.  Her obstetrical history is significant for obesity, pregnancy induced hypertension and history of a low TSH level. Patient does not intend to breast feed. Pregnancy history fully reviewed.  Patient reports no complaints. Was feeling nauseated but that has resolved.      HISTORY: OB History  Gravida Para Term Preterm AB Living  '10 6 6 ' 0 3 6  SAB IAB Ectopic Multiple Live Births  1 2 0 0 6    # Outcome Date GA Lbr Len/2nd Weight Sex Delivery Anes PTL Lv  10 Current           9 Term 01/09/20   7 lb (3.175 kg) F Vag-Spont None  LIV  8 Term 12/17/17 459w4d1:00 / 01:07 7 lb 4.2 oz (3.294 kg) F Vag-Spont EPI  LIV     Birth Comments: WNL     Name: Garron,GIRL Tyshawn     Apgar1: 8  Apgar5: 9  7 IAB 2018          6 SAB 10/2015 6w59w0d      5 Term 03/12/14 40w61w3d41 / 00:16 7 lb 10.2 oz (3.465 kg) F Vag-Spont EPI  LIV     Birth Comments: no complications     Name: Woodcox,GIRL Lorita     Apgar1: 9  Apgar5: 9  4 Term 10/14/12 41w070w0d0 / 00:13 7 lb 5.6 oz (3.334 kg) M Vag-Spont EPI  LIV     Birth Comments: +GBS     Name: Heidler,BOY Rhealyn     Apgar1: 9  Apgar5: 9  3 IAB 2013 71w0d48w0d      Birth Comments: System Generated. Please review and update pregnancy details.  2 Term 08/28/10 [redacted]w[redacted]d 51w0d8 lb 13 oz (3.997 kg) M Vag-Spont EPI  LIV     Birth Comments: leiomyomatous uterus  1 Term 01/14/08 [redacted]w[redacted]d 057w0d lb 2 oz (2.778 kg) M Vag-Spont EPI  LIV     Birth Comments: no complications    Last pap smear was done 05/19/17 and was normal  Past Medical History:  Diagnosis Date  . Anemia    first and sec pregnancies  . Arrhythmia   . Asymptomatic bacteriuria during pregnancy in first trimester 07/09/2019   E.coli and enteroccus faecalis in urine at NOB; RX for keflex sent.   . Chlamydia   . Gestational hypertension 12/17/2017   intrapartum  . Gonorrhea   .  Headache(784.0)   . Hemorrhoids   . Hx of migraine headaches   . Leiomyoma of uterus in pregnancy   . Marijuana use 06/16/2017   Positive screen on initial OB   Past Surgical History:  Procedure Laterality Date  . INDUCED ABORTION  March  2 ,2013  . WISDOM TOOTH EXTRACTION     Family History  Problem Relation Age of Onset  . Healthy Mother   . Healthy Father   . Other Neg Hx   . Alcohol abuse Neg Hx   . Arthritis Neg Hx   . Asthma Neg Hx   . Birth defects Neg Hx   . Cancer Neg Hx   . COPD Neg Hx   . Depression Neg Hx   . Diabetes Neg Hx   . Drug abuse Neg Hx   . Early death Neg Hx   . Heart disease Neg Hx   .  Hearing loss Neg Hx   . Hyperlipidemia Neg Hx   . Hypertension Neg Hx   . Kidney disease Neg Hx   . Learning disabilities Neg Hx   . Mental illness Neg Hx   . Mental retardation Neg Hx   . Miscarriages / Stillbirths Neg Hx   . Stroke Neg Hx   . Vision loss Neg Hx    Social History   Tobacco Use  . Smoking status: Never Smoker  . Smokeless tobacco: Never Used  Vaping Use  . Vaping Use: Never used  Substance Use Topics  . Alcohol use: Not Currently    Comment: Pt reports socially  . Drug use: Not Currently    Frequency: 14.0 times per week    Types: Marijuana    Comment: Reports not currently smoking   Allergies  Allergen Reactions  . Latex Rash    Burning sensation   Current Outpatient Medications on File Prior to Visit  Medication Sig Dispense Refill  . docusate sodium (COLACE) 250 MG capsule Take 1 capsule (250 mg total) by mouth daily. 30 capsule 1  . famotidine (PEPCID) 20 MG tablet Take 1 tablet (20 mg total) by mouth 2 (two) times daily. 30 tablet 1  . fluticasone (FLONASE) 50 MCG/ACT nasal spray Place 1 spray into both nostrils as needed for allergies. 16 g 0  . ondansetron (ZOFRAN ODT) 4 MG disintegrating tablet Take 1 tablet (4 mg total) by mouth every 8 (eight) hours as needed for nausea or vomiting. 30 tablet 1  . promethazine  (PHENERGAN) 25 MG tablet Take 1 tablet (25 mg total) by mouth every 6 (six) hours as needed for nausea or vomiting. 30 tablet 1  . ferrous sulfate 325 (65 FE) MG tablet Take 1 tablet (325 mg total) by mouth daily. (Patient not taking: Reported on 11/15/2020) 30 tablet 1   No current facility-administered medications on file prior to visit.    Review of Systems Pertinent items noted in HPI and remainder of comprehensive ROS otherwise negative. Physical Exam:   Vitals:   11/15/20 1600  BP: 103/64  Pulse: 86  Weight: 170 lb 3.2 oz (77.2 kg)   Fetal Heart Rate (bpm): 161  Constitutional: Well-developed, well-nourished pregnant female in no acute distress.  HEENT: PERRLA Skin: normal color and turgor, no rash Cardiovascular: normal rate & rhythm, no murmur Respiratory: normal effort, lung sounds clear throughout GI: Abd soft, non-tender, pos BS x 4, gravid appropriate for gestational age MS: Extremities nontender, no edema, normal ROM Neurologic: Alert and oriented x 4.  GU: no CVA tenderness Pelvic: NEFG, physiologic discharge, no blood, cervix clean. Pap/swabs collected  Assessment:    Pregnancy: [id number] Patient Active Problem List   Diagnosis Date Noted  . Supervision of high risk pregnancy, antepartum 11/15/2020  . Maternal atypical antibody 07/09/2019  . Fibroids 06/18/2019  . Obesity in pregnancy 06/18/2019   Plan:    1. Supervision of high risk pregnancy, antepartum - Doing well, feeling regular fetal movement. Eager to have anatomy scan done.  2. [redacted] weeks gestation of pregnancy Routine new OB care (late to [facility] due to pregnancy ambivalence). MyChart has been set up and she is aware of our practice structure as this is her 6th pregnancy with [facility]. - CBC/D/Plt+RPR+Rh+ABO+RubIgG... - Cytology - PAP( [place]) - Cervicovaginal ancillary only( [place]) - [facility] AMB BABYSCRIPTS SCHEDULE OPTIMIZATION - [place] MFM OB DETAIL +14 WK; Future - Blood Pressure Monitoring (BLOOD  PRESSURE KIT) DEVI; 1 Device by Does not  apply route as needed.  Dispense: 1 each; Refill: 0 - Continue prenatal vitamins. - Problem list reviewed and updated. - Genetic Screening discussed, First trimester screen, Quad screen and NIPS: ordered - Ultrasound discussed; fetal anatomic survey: ordered. - Anticipatory guidance about prenatal visits given including labs, ultrasounds, and testing. - Discussed usage of Babyscripts and virtual visits as additional source of managing and completing prenatal visits in midst of coronavirus and pandemic.    Routine obstetric precautions reviewed. Encouraged to seek out care at office or emergency room [facility] MAU preferred) for urgent and/or emergent concerns. Return in about 4 weeks (around 12/13/2020) for IN-PERSON, LOB w GTT.    Gaylan Gerold, MSN, CNM, [place] Certified Nurse Midwife, [facility]

## 2020-11-15 NOTE — Patient Instructions (Addendum)
Second Trimester of Pregnancy  The second trimester of pregnancy is from week 13 through week 27. This is also called months 4 through 6 of pregnancy. This is often the time when you feel your best. During the second trimester:  Morning sickness is less or has stopped.  You may have more energy.  You may feel hungry more often. At this time, your unborn baby (fetus) is growing very fast. At the end of the sixth month, the unborn baby may be up to 12 inches long and weigh about 1 pounds. You will likely start to feel the baby move between 16 and 20 weeks of pregnancy. Body changes during your second trimester Your body continues to go through many changes during this time. The changes vary and generally return to normal after the baby is born. Physical changes  You will gain more weight.  You may start to get stretch marks on your hips, belly (abdomen), and breasts.  Your breasts will grow and may hurt.  Dark spots or blotches may develop on your face.  A dark line from your belly button to the pubic area (linea nigra) may appear.  You may have changes in your hair. Health changes  You may have headaches.  You may have heartburn.  You may have trouble pooping (constipation).  You may have hemorrhoids or swollen, bulging veins (varicose veins).  Your gums may bleed.  You may pee (urinate) more often.  You may have back pain. Follow these instructions at home: Medicines  Take over-the-counter and prescription medicines only as told by your doctor. Some medicines are not safe during pregnancy.  Take a prenatal vitamin that contains at least 600 micrograms (mcg) of folic acid. Eating and drinking  Eat healthy meals that include: ? Fresh fruits and vegetables. ? Whole grains. ? Good sources of protein, such as meat, eggs, or tofu. ? Low-fat dairy products.  Avoid raw meat and unpasteurized juice, milk, and cheese.  You may need to take these actions to prevent or  treat trouble pooping: ? Drink enough fluids to keep your pee (urine) pale yellow. ? Eat foods that are high in fiber. These include beans, whole grains, and fresh fruits and vegetables. ? Limit foods that are high in fat and sugar. These include fried or sweet foods. Activity  Exercise only as told by your doctor. Most people can do their usual exercise during pregnancy. Try to exercise for 30 minutes at least 5 days a week.  Stop exercising if you have pain or cramps in your belly or lower back.  Do not exercise if it is too hot or too humid, or if you are in a place of great height (high altitude).  Avoid heavy lifting.  If you choose to, you may have sex unless your doctor tells you not to. Relieving pain and discomfort  Wear a good support bra if your breasts are sore.  Take warm water baths (sitz baths) to soothe pain or discomfort caused by hemorrhoids. Use hemorrhoid cream if your doctor approves.  Rest with your legs raised (elevated) if you have leg cramps or low back pain.  If you develop bulging veins in your legs: ? Wear support hose as told by your doctor. ? Raise your feet for 15 minutes, 3-4 times a day. ? Limit salt in your food. Safety  Wear your seat belt at all times when you are in a car.  Talk with your doctor if someone is hurting you or yelling  at you a lot. Lifestyle  Do not use hot tubs, steam rooms, or saunas.  Do not douche. Do not use tampons or scented sanitary pads.  Avoid cat litter boxes and soil used by cats. These carry germs that can harm your baby and can cause a loss of your baby by miscarriage or stillbirth.  Do not use herbal medicines, illegal drugs, or medicines that are not approved by your doctor. Do not drink alcohol.  Do not smoke or use any products that contain nicotine or tobacco. If you need help quitting, ask your doctor. General instructions  Keep all follow-up visits. This is important.  Ask your doctor about local  prenatal classes.  Ask your doctor about the right foods to eat or for help finding a counselor. Where to find more information  American Pregnancy Association: americanpregnancy.org  [employer]nec[employer]: www.acog.org  Office on [employer]: KeywordPortfolios.com.br Contact a doctor if:  You have a headache that does not go away when you take medicine.  You have changes in how you see, or you see spots in front of your eyes.  You have mild cramps, pressure, or pain in your lower belly.  You continue to feel like you may vomit (nauseous), you vomit, or you have watery poop (diarrhea).  You have bad-smelling fluid coming from your vagina.  You have pain when you pee or your pee smells bad.  You have very bad swelling of your face, hands, ankles, feet, or legs.  You have a fever. Get help right away if:  You are leaking fluid from your vagina.  You have spotting or bleeding from your vagina.  You have very bad belly cramping or pain.  You have trouble breathing.  You have chest pain.  You faint.  You have not felt your baby move for the time period told by your doctor.  You have new or increased pain, swelling, or redness in an arm or leg. Summary  The second trimester of pregnancy is from week 13 through week 27 (months 4 through 6).  Eat healthy meals.  Exercise as told by your doctor. Most people can do their usual exercise during pregnancy.  Do not use herbal medicines, illegal drugs, or medicines that are not approved by your doctor. Do not drink alcohol.  Call your doctor if you get sick or if you notice anything unusual about your pregnancy. This information is not intended to replace advice given to you by your health care provider. Make sure you discuss any questions you have with your health care provider. Document Revised: 11/24/2019 Document Reviewed: 09/30/2019 Elsevier Patient Education  [employer].   Oral Glucose Tolerance Test During Pregnancy Why am I having this test? The oral glucose tolerance test (OGTT) is done to check how your body processes blood sugar (glucose). This is one of several tests used to diagnose diabetes that develops during pregnancy (gestational diabetes mellitus). Gestational diabetes is a short-term form of diabetes that some women develop while they are pregnant. It usually occurs during the second trimester of pregnancy and goes away after delivery. Testing, or screening, for gestational diabetes usually occurs at weeks 24-28 of pregnancy. You may have the OGTT test after having a 1-hour glucose screening test if the results from that test indicate that you may have gestational diabetes. This test may also be needed if:  You have a history of gestational diabetes.  There is a history of giving birth to very large  babies or of losing pregnancies (having stillbirths).  You have signs and symptoms of diabetes, such as: ? Changes in your eyesight. ? Tingling or numbness in your hands or feet. ? Changes in hunger, thirst, and urination, and these are not explained by your pregnancy. What is being tested? This test measures the amount of glucose in your blood at different times during a period of 3 hours. This shows how well your body can process glucose. What kind of sample is taken? Blood samples are required for this test. They are usually collected by inserting a needle into a blood vessel.   How do I prepare for this test?  For 3 days before your test, eat normally. Have plenty of carbohydrate-rich foods.  Follow instructions from your health care provider about: ? Eating or drinking restrictions on the day of the test. You may be asked not to eat or drink anything other than water (to fast) starting 8-10 hours before the test. ? Changing or stopping your regular medicines. Some medicines may interfere with this test. Tell a health care provider  about:  All medicines you are taking, including vitamins, herbs, eye drops, creams, and over-the-counter medicines.  Any blood disorders you have.  Any surgeries you have had.  Any medical conditions you have. What happens during the test? First, your blood glucose will be measured. This is referred to as your fasting blood glucose because you fasted before the test. Then, you will drink a glucose solution that contains a certain amount of glucose. Your blood glucose will be measured again 1, 2, and 3 hours after you drink the solution. This test takes about 3 hours to complete. You will need to stay at the testing location during this time. During the testing period:  Do not eat or drink anything other than the glucose solution.  Do not exercise.  Do not use any products that contain nicotine or tobacco, such as cigarettes, e-cigarettes, and chewing tobacco. These can affect your test results. If you need help quitting, ask your health care provider. The testing procedure may vary among health care providers and hospitals. How are the results reported? Your results will be reported as milligrams of glucose per deciliter of blood (mg/dL) or millimoles per liter (mmol/L). There is more than one source for screening and diagnosis reference values used to diagnose gestational diabetes. Your health care provider will compare your results to normal values that were established after testing a large group of people (reference values). Reference values may vary among labs and hospitals. For this test (Carpenter-Coustan), reference values are:  Fasting: 95 mg/dL (5.3 mmol/L).  1 hour: 180 mg/dL (10.0 mmol/L).  2 hour: 155 mg/dL (8.6 mmol/L).  3 hour: 140 mg/dL (7.8 mmol/L). What do the results mean? Results below the reference values are considered normal. If two or more of your blood glucose levels are at or above the reference values, you may be diagnosed with gestational diabetes. If only  one level is high, your health care provider may suggest repeat testing or other tests to confirm a diagnosis. Talk with your health care provider about what your results mean. Questions to ask your health care provider Ask your health care provider, or the department that is doing the test:  When will my results be ready?  How will I get my results?  What are my treatment options?  What other tests do I need?  What are my next steps? Summary  The oral glucose tolerance test (OGTT)  is one of several tests used to diagnose diabetes that develops during pregnancy (gestational diabetes mellitus). Gestational diabetes is a short-term form of diabetes that some women develop while they are pregnant.  You may have the OGTT test after having a 1-hour glucose screening test if the results from that test show that you may have gestational diabetes. You may also have this test if you have any symptoms or risk factors for this type of diabetes.  Talk with your health care provider about what your results mean. This information is not intended to replace advice given to you by your health care provider. Make sure you discuss any questions you have with your health care provider. Document Revised: 11/25/2019 Document Reviewed: 11/25/2019 Elsevier Patient Education  2021 [employer].

## 2020-11-16 ENCOUNTER — Encounter: Payer: Self-pay | Admitting: *Deleted

## 2020-11-16 ENCOUNTER — Encounter: Payer: Self-pay | Admitting: Certified Nurse Midwife

## 2020-11-16 ENCOUNTER — Telehealth: Payer: Self-pay | Admitting: Lactation Services

## 2020-11-16 LAB — CBC/D/PLT+RPR+RH+ABO+RUBIGG...
Antibody Screen: NEGATIVE
Basophils Absolute: 0 10*3/uL (ref 0.0–0.2)
Basos: 1 %
EOS (ABSOLUTE): 0.1 10*3/uL (ref 0.0–0.4)
Eos: 1 %
HCV Ab: <0.1 s/co ratio (ref 0.0–0.9)
HIV Screen 4th Generation wRfx: NONREACTIVE
Hematocrit: 31.7 % — ABNORMAL LOW (ref 34.0–46.6)
Hemoglobin: 10.6 g/dL — ABNORMAL LOW (ref 11.1–15.9)
Hepatitis B Surface Ag: NEGATIVE
Immature Grans (Abs): 0 10*3/uL (ref 0.0–0.1)
Immature Granulocytes: 0 %
Lymphocytes Absolute: 1.5 10*3/uL (ref 0.7–3.1)
Lymphs: 27 %
MCH: 29.2 pg (ref 26.6–33.0)
MCHC: 33.4 g/dL (ref 31.5–35.7)
MCV: 87 fL (ref 79–97)
Monocytes Absolute: 0.2 10*3/uL (ref 0.1–0.9)
Monocytes: 4 %
Neutrophils Absolute: 3.6 10*3/uL (ref 1.4–7.0)
Neutrophils: 67 %
Platelets: 273 10*3/uL (ref 150–450)
RBC: 3.63 x10E6/uL — ABNORMAL LOW (ref 3.77–5.28)
RDW: 12.9 % (ref 11.7–15.4)
RPR Ser Ql: NONREACTIVE
Rh Factor: POSITIVE
Rubella Antibodies, IGG: 10.1 index (ref >0.99)
WBC: 5.4 10*3/uL (ref 3.4–10.8)

## 2020-11-16 LAB — CERVICOVAGINAL ANCILLARY ONLY
Bacterial Vaginitis (gardnerella): NEGATIVE
Candida Glabrata: NEGATIVE
Candida Vaginitis: NEGATIVE
Chlamydia: NEGATIVE
Comment: NEGATIVE
Comment: NEGATIVE
Comment: NEGATIVE
Comment: NEGATIVE
Comment: NEGATIVE
Comment: NORMAL
Neisseria Gonorrhea: NEGATIVE
Trichomonas: POSITIVE — AB

## 2020-11-16 LAB — HCV INTERPRETATION

## 2020-11-16 LAB — THYROID PANEL WITH TSH
Free Thyroxine Index: 2.3 (ref 1.2–4.9)
T3 Uptake Ratio: 19 % — ABNORMAL LOW (ref 24–39)
T4, Total: 11.9 ug/dL (ref 4.5–12.0)
TSH: 0.783 u[IU]/mL (ref 0.450–4.500)

## 2020-11-16 NOTE — Telephone Encounter (Signed)
-----   Message from Gabriel Carina, [place] sent at 11/15/2020  5:01 PM EDT ----- Regarding: Needs Iron Infusion Please call to have this patient scheduled for a one-time dose of venofer. Medicaid will not cover her oral iron and she cannot afford to purchase. Thank you!

## 2020-11-16 NOTE — Telephone Encounter (Signed)
Called Short Stay Unit to schedule Venofer Infusion for June 7 at 8 am.   Called patient and she was informed of Venofer infusion, date, time and location. She asked to be sent a My chart message with the information.   Patient asked about [place], reviewed date, time and location.   My Chart Message sent.

## 2020-11-20 LAB — CYTOLOGY - PAP
Comment: NEGATIVE
Diagnosis: NEGATIVE
High risk HPV: NEGATIVE

## 2020-11-21 ENCOUNTER — Other Ambulatory Visit: Payer: Self-pay | Admitting: Certified Nurse Midwife

## 2020-11-21 DIAGNOSIS — A599 Trichomoniasis, unspecified: Secondary | ICD-10-CM

## 2020-11-21 MED ORDER — METRONIDAZOLE 500 MG PO TABS: 500.0000 mg | ORAL_TABLET | Freq: Two times a day (BID) | ORAL | 0 refills | Status: DC

## 2020-11-21 MED ORDER — METRONIDAZOLE 500 MG PO TABS
500.0000 mg | ORAL_TABLET | Freq: Two times a day (BID) | ORAL | 0 refills | Status: DC
Start: 2020-11-21 — End: 2020-11-21

## 2020-11-21 NOTE — Addendum Note (Signed)
Addended by: Gaylan Gerold on: 11/21/2020 08:19 AM   Modules accepted: Orders

## 2020-12-04 NOTE — Discharge Instructions (Signed)

## 2020-12-05 ENCOUNTER — Encounter (HOSPITAL_COMMUNITY): Payer: Self-pay

## 2020-12-05 ENCOUNTER — Inpatient Hospital Stay (HOSPITAL_COMMUNITY)
Admission: RE | Admit: 2020-12-05 | Discharge: 2020-12-05 | Disposition: A | Discharge status: Home / Self Care | Payer: Medicaid Other | Source: Ambulatory Visit | Attending: Certified Nurse Midwife | Admitting: Certified Nurse Midwife

## 2020-12-07 ENCOUNTER — Ambulatory Visit: Payer: Medicaid Other | Attending: Certified Nurse Midwife

## 2020-12-07 ENCOUNTER — Ambulatory Visit: Payer: Medicaid Other | Admitting: *Deleted

## 2020-12-07 ENCOUNTER — Other Ambulatory Visit: Payer: Self-pay

## 2020-12-07 ENCOUNTER — Encounter: Payer: Self-pay | Admitting: *Deleted

## 2020-12-07 VITALS — BP 114/65 | HR 97

## 2020-12-07 DIAGNOSIS — O0990 Supervision of high risk pregnancy, unspecified, unspecified trimester: Secondary | ICD-10-CM

## 2020-12-08 ENCOUNTER — Other Ambulatory Visit: Payer: Self-pay | Admitting: *Deleted

## 2020-12-08 DIAGNOSIS — O0933 Supervision of pregnancy with insufficient antenatal care, third trimester: Secondary | ICD-10-CM

## 2020-12-15 ENCOUNTER — Encounter: Payer: Medicaid Other | Admitting: Family Medicine

## 2021-01-04 ENCOUNTER — Ambulatory Visit: Payer: Medicaid Other | Attending: Maternal & Fetal Medicine

## 2021-01-04 ENCOUNTER — Ambulatory Visit: Payer: Medicaid Other

## 2021-01-04 ENCOUNTER — Other Ambulatory Visit: Payer: Self-pay | Admitting: Maternal & Fetal Medicine

## 2021-01-04 DIAGNOSIS — O0943 Supervision of pregnancy with grand multiparity, third trimester: Secondary | ICD-10-CM

## 2021-01-04 DIAGNOSIS — O09893 Supervision of other high risk pregnancies, third trimester: Secondary | ICD-10-CM

## 2021-01-04 DIAGNOSIS — Z3A32 32 weeks gestation of pregnancy: Secondary | ICD-10-CM

## 2021-01-04 DIAGNOSIS — O0933 Supervision of pregnancy with insufficient antenatal care, third trimester: Secondary | ICD-10-CM

## 2021-01-04 DIAGNOSIS — Z362 Encounter for other antenatal screening follow-up: Secondary | ICD-10-CM

## 2021-02-06 ENCOUNTER — Encounter: Payer: Self-pay | Admitting: *Deleted

## 2021-02-06 ENCOUNTER — Ambulatory Visit: Payer: Medicaid Other | Attending: Maternal & Fetal Medicine

## 2021-02-06 ENCOUNTER — Ambulatory Visit: Payer: Medicaid Other | Admitting: *Deleted

## 2021-02-06 ENCOUNTER — Other Ambulatory Visit: Payer: Self-pay

## 2021-02-06 VITALS — BP 123/80 | HR 78

## 2021-02-06 DIAGNOSIS — O0943 Supervision of pregnancy with grand multiparity, third trimester: Secondary | ICD-10-CM | POA: Insufficient documentation

## 2021-02-06 DIAGNOSIS — O09893 Supervision of other high risk pregnancies, third trimester: Secondary | ICD-10-CM | POA: Insufficient documentation

## 2021-02-06 DIAGNOSIS — Z362 Encounter for other antenatal screening follow-up: Secondary | ICD-10-CM | POA: Diagnosis present

## 2021-02-06 DIAGNOSIS — O0990 Supervision of high risk pregnancy, unspecified, unspecified trimester: Secondary | ICD-10-CM

## 2021-02-06 DIAGNOSIS — Z3A32 32 weeks gestation of pregnancy: Secondary | ICD-10-CM | POA: Insufficient documentation

## 2021-02-06 DIAGNOSIS — O0933 Supervision of pregnancy with insufficient antenatal care, third trimester: Secondary | ICD-10-CM | POA: Diagnosis present

## 2021-02-08 ENCOUNTER — Other Ambulatory Visit (HOSPITAL_COMMUNITY)
Admission: RE | Admit: 2021-02-08 | Discharge: 2021-02-08 | Disposition: A | Discharge status: Home / Self Care | Payer: Medicaid Other | Source: Ambulatory Visit | Attending: Obstetrics & Gynecology | Admitting: Obstetrics & Gynecology

## 2021-02-08 ENCOUNTER — Ambulatory Visit (INDEPENDENT_AMBULATORY_CARE_PROVIDER_SITE_OTHER): Payer: Medicaid Other | Admitting: Obstetrics & Gynecology

## 2021-02-08 ENCOUNTER — Other Ambulatory Visit: Payer: Self-pay

## 2021-02-08 VITALS — BP 124/91 | HR 83 | Wt 176.6 lb

## 2021-02-08 DIAGNOSIS — O0990 Supervision of high risk pregnancy, unspecified, unspecified trimester: Secondary | ICD-10-CM | POA: Insufficient documentation

## 2021-02-08 DIAGNOSIS — O133 Gestational [pregnancy-induced] hypertension without significant proteinuria, third trimester: Secondary | ICD-10-CM | POA: Diagnosis not present

## 2021-02-08 DIAGNOSIS — Z3A37 37 weeks gestation of pregnancy: Secondary | ICD-10-CM | POA: Diagnosis not present

## 2021-02-08 DIAGNOSIS — Z5941 Food insecurity: Secondary | ICD-10-CM | POA: Diagnosis not present

## 2021-02-08 NOTE — Progress Notes (Addendum)
Subjective:  Sonia Allen is a 35 y.o. [id number] at 28w0dbeing seen today for prenatal care.  Patient reports no complaints.  Contractions: Not present.  Vag. Bleeding: None. Movement: Present. Denies leaking of fluid.   The following portions of the patient's history were reviewed and updated as appropriate: allergies, current medications, past family history, past medical history, past social history, past surgical history and problem list.   Objective:   Vitals:   02/08/21 0912  BP: (!) 131/94  Pulse: 83  Weight: 176 lb 9.6 oz (80.1 kg)    Fetal Status: Fetal Heart Rate (bpm): 140   Movement: Present     General:  Alert, oriented and cooperative. Patient is in no acute distress.  Skin: Skin is warm and dry. No rash noted.   Cardiovascular: Normal heart rate noted  Respiratory: Normal respiratory effort, no problems with respiration noted  Abdomen: Soft, gravid, appropriate for gestational age. Pain/Pressure: Present     Vaginal: Vag. Bleeding: None.       Cervix: Exam revealed: exam not well tolerated by patient but no evidence of cervical change         Extremities: Normal range of motion.  Edema: Trace  Mental Status: Normal mood and affect. Normal behavior. Normal judgment and thought content.   Urinalysis:      Assessment and Plan:  Pregnancy: [id number] 378w0d1. Supervision of high risk pregnancy, antepartum - Denies contractions, LOF, vaginal bleeding and is having good fetal movement  - Culture, beta strep (group b only) - GC[facility]Chlamydia [facility]. [redacted] weeks gestation of pregnancy  3. Gestational Hypertension  - BP elevated at 131/94 and repeat BP 124/94, no other elevations noted, patient asymptomatic  - Baseline preeclampsia labs collected  - Will have patient return in 4 days for reevaluation of BP   4. Food insecurity - [facility]  Term labor symptoms and general obstetric precautions including but  not limited to vaginal bleeding, contractions, leaking of fluid and fetal movement were reviewed in detail with the patient. Please refer to After Visit Summary for other counseling recommendations.  Return in about 1 week (around 02/15/2021). Attestation of Attending Supervision of PAStudent: Evaluation and management procedures were performed by the student under my supervision and collaboration.  I have reviewed the student's note and chart, and I agree with the management and plan.  JAEmeterio ReeveMD, FARepublicttending ObPonderosa PineWo[place]Em[place]

## 2021-02-09 LAB — COMPREHENSIVE METABOLIC PANEL
ALT: 8 IU/L (ref 0–32)
AST: 11 IU/L (ref 0–40)
Albumin/Globulin Ratio: 1.3 (ref 1.2–2.2)
Albumin: 3.2 g/dL — ABNORMAL LOW (ref 3.8–4.8)
Alkaline Phosphatase: 140 IU/L — ABNORMAL HIGH (ref 44–121)
BUN/Creatinine Ratio: 6 — ABNORMAL LOW (ref 9–23)
BUN: 3 mg/dL — ABNORMAL LOW (ref 6–20)
Bilirubin Total: 0.6 mg/dL (ref 0.0–1.2)
CO2: 22 mmol/L (ref 20–29)
Calcium: 8.4 mg/dL — ABNORMAL LOW (ref 8.7–10.2)
Chloride: 105 mmol/L (ref 96–106)
Creatinine, Ser: 0.49 mg/dL — ABNORMAL LOW (ref 0.57–1.00)
Globulin, Total: 2.4 g/dL (ref 1.5–4.5)
Glucose: 73 mg/dL (ref 65–99)
Potassium: 3.7 mmol/L (ref 3.5–5.2)
Sodium: 138 mmol/L (ref 134–144)
Total Protein: 5.6 g/dL — ABNORMAL LOW (ref 6.0–8.5)
eGFR: 128 mL/min/{1.73_m2} (ref >59)

## 2021-02-09 LAB — PROTEIN / CREATININE RATIO, URINE
Creatinine, Urine: 178.7 mg/dL
Protein, Ur: 29.1 mg/dL
Protein/Creat Ratio: 163 mg/g creat (ref 0–200)

## 2021-02-09 LAB — GC/CHLAMYDIA PROBE AMP (~~LOC~~) NOT AT ARMC
Chlamydia: NEGATIVE
Comment: NEGATIVE
Comment: NORMAL
Neisseria Gonorrhea: NEGATIVE

## 2021-02-09 LAB — CBC
Hematocrit: 27.7 % — ABNORMAL LOW (ref 34.0–46.6)
Hemoglobin: 8.9 g/dL — ABNORMAL LOW (ref 11.1–15.9)
MCH: 28.1 pg (ref 26.6–33.0)
MCHC: 32.1 g/dL (ref 31.5–35.7)
MCV: 87 fL (ref 79–97)
Platelets: 235 10*3/uL (ref 150–450)
RBC: 3.17 x10E6/uL — ABNORMAL LOW (ref 3.77–5.28)
RDW: 13 % (ref 11.7–15.4)
WBC: 5.3 10*3/uL (ref 3.4–10.8)

## 2021-02-12 ENCOUNTER — Encounter: Payer: Medicaid Other | Admitting: Obstetrics & Gynecology

## 2021-02-12 LAB — CULTURE, BETA STREP (GROUP B ONLY): Strep Gp B Culture: NEGATIVE

## 2021-02-14 ENCOUNTER — Other Ambulatory Visit: Payer: Self-pay

## 2021-02-14 ENCOUNTER — Inpatient Hospital Stay (HOSPITAL_COMMUNITY)
Admission: AD | Admit: 2021-02-14 | Discharge: 2021-02-16 | DRG: 798 | Disposition: A | Discharge status: Home / Self Care | Payer: Medicaid Other | Attending: Family Medicine | Admitting: Family Medicine

## 2021-02-14 ENCOUNTER — Inpatient Hospital Stay (HOSPITAL_COMMUNITY): Payer: Medicaid Other | Admitting: Anesthesiology

## 2021-02-14 ENCOUNTER — Encounter (HOSPITAL_COMMUNITY): Payer: Self-pay | Admitting: Family Medicine

## 2021-02-14 DIAGNOSIS — Z3A37 37 weeks gestation of pregnancy: Secondary | ICD-10-CM

## 2021-02-14 DIAGNOSIS — O3413 Maternal care for benign tumor of corpus uteri, third trimester: Secondary | ICD-10-CM | POA: Diagnosis present

## 2021-02-14 DIAGNOSIS — Z302 Encounter for sterilization: Secondary | ICD-10-CM

## 2021-02-14 DIAGNOSIS — O99214 Obesity complicating childbirth: Secondary | ICD-10-CM | POA: Diagnosis present

## 2021-02-14 DIAGNOSIS — O0990 Supervision of high risk pregnancy, unspecified, unspecified trimester: Secondary | ICD-10-CM

## 2021-02-14 DIAGNOSIS — Z9079 Acquired absence of other genital organ(s): Secondary | ICD-10-CM

## 2021-02-14 DIAGNOSIS — Z91040 Latex allergy status: Secondary | ICD-10-CM | POA: Diagnosis not present

## 2021-02-14 DIAGNOSIS — O0930 Supervision of pregnancy with insufficient antenatal care, unspecified trimester: Secondary | ICD-10-CM

## 2021-02-14 DIAGNOSIS — O134 Gestational [pregnancy-induced] hypertension without significant proteinuria, complicating childbirth: Secondary | ICD-10-CM | POA: Diagnosis present

## 2021-02-14 DIAGNOSIS — D259 Leiomyoma of uterus, unspecified: Secondary | ICD-10-CM | POA: Diagnosis present

## 2021-02-14 DIAGNOSIS — Z20822 Contact with and (suspected) exposure to covid-19: Secondary | ICD-10-CM | POA: Diagnosis present

## 2021-02-14 DIAGNOSIS — Z641 Problems related to multiparity: Secondary | ICD-10-CM

## 2021-02-14 DIAGNOSIS — O26893 Other specified pregnancy related conditions, third trimester: Secondary | ICD-10-CM | POA: Diagnosis present

## 2021-02-14 DIAGNOSIS — O165 Unspecified maternal hypertension, complicating the puerperium: Secondary | ICD-10-CM | POA: Diagnosis not present

## 2021-02-14 LAB — CBC
HCT: 32.1 % — ABNORMAL LOW (ref 36.0–46.0)
Hemoglobin: 9.9 g/dL — ABNORMAL LOW (ref 12.0–15.0)
MCH: 27.5 pg (ref 26.0–34.0)
MCHC: 30.8 g/dL (ref 30.0–36.0)
MCV: 89.2 fL (ref 80.0–100.0)
Platelets: 272 10*3/uL (ref 150–400)
RBC: 3.6 MIL/uL — ABNORMAL LOW (ref 3.87–5.11)
RDW: 13.3 % (ref 11.5–15.5)
WBC: 5.7 10*3/uL (ref 4.0–10.5)
nRBC: 0 % (ref 0.0–0.2)

## 2021-02-14 LAB — RESP PANEL BY RT-PCR (FLU A&B, COVID) ARPGX2
Influenza A by PCR: NEGATIVE
Influenza B by PCR: NEGATIVE
SARS Coronavirus 2 by RT PCR: NEGATIVE

## 2021-02-14 LAB — TYPE AND SCREEN
ABO/RH(D): B POS
Antibody Screen: NEGATIVE

## 2021-02-14 MED ORDER — ZOLPIDEM TARTRATE 5 MG PO TABS: 5.0000 mg | ORAL_TABLET | Freq: Every evening | ORAL | Status: DC | PRN

## 2021-02-14 MED ORDER — LIDOCAINE HCL (PF) 1 % IJ SOLN
INTRAMUSCULAR | Status: DC | PRN
  Administered 2021-02-14: 8 mL via EPIDURAL

## 2021-02-14 MED ORDER — TRANEXAMIC ACID-NACL 1000-0.7 MG/100ML-% IV SOLN
1000.0000 mg | INTRAVENOUS | Status: AC
  Administered 2021-02-14: 1000 mg via INTRAVENOUS
  Filled 2021-02-14: qty 100

## 2021-02-14 MED ORDER — ONDANSETRON HCL 4 MG/2ML IJ SOLN: 4.0000 mg | Freq: Four times a day (QID) | INTRAMUSCULAR | Status: DC | PRN

## 2021-02-14 MED ORDER — LACTATED RINGERS IV SOLN: 500.0000 mL | INTRAVENOUS | Status: DC | PRN

## 2021-02-14 MED ORDER — OXYTOCIN BOLUS FROM INFUSION
333.0000 mL | Freq: Once | INTRAVENOUS | Status: AC
  Administered 2021-02-14: 333 mL via INTRAVENOUS

## 2021-02-14 MED ORDER — SENNOSIDES-DOCUSATE SODIUM 8.6-50 MG PO TABS
2.0000 | ORAL_TABLET | ORAL | Status: DC
  Administered 2021-02-14 – 2021-02-15 (×2): 2 via ORAL
  Filled 2021-02-14 (×2): qty 2

## 2021-02-14 MED ORDER — PHENYLEPHRINE 40 MCG/ML (10ML) SYRINGE FOR IV PUSH (FOR BLOOD PRESSURE SUPPORT): 80.0000 ug | PREFILLED_SYRINGE | INTRAVENOUS | Status: DC | PRN

## 2021-02-14 MED ORDER — LACTATED RINGERS IV SOLN
500.0000 mL | Freq: Once | INTRAVENOUS | Status: AC
  Administered 2021-02-14: 500 mL via INTRAVENOUS

## 2021-02-14 MED ORDER — DIPHENHYDRAMINE HCL 50 MG/ML IJ SOLN
12.5000 mg | INTRAMUSCULAR | Status: DC | PRN
Start: 2021-02-14 — End: 2021-02-14

## 2021-02-14 MED ORDER — LIDOCAINE HCL (PF) 1 % IJ SOLN: 30.0000 mL | INTRAMUSCULAR | Status: DC | PRN

## 2021-02-14 MED ORDER — IBUPROFEN 600 MG PO TABS
600.0000 mg | ORAL_TABLET | Freq: Four times a day (QID) | ORAL | Status: DC
  Administered 2021-02-14 – 2021-02-16 (×5): 600 mg via ORAL
  Filled 2021-02-14 (×6): qty 1

## 2021-02-14 MED ORDER — PRENATAL MULTIVITAMIN CH
1.0000 | ORAL_TABLET | Freq: Every day | ORAL | Status: DC
  Administered 2021-02-16: 1 via ORAL
  Filled 2021-02-14: qty 1

## 2021-02-14 MED ORDER — SOD CITRATE-CITRIC ACID 500-334 MG/5ML PO SOLN: 30.0000 mL | ORAL | Status: DC | PRN

## 2021-02-14 MED ORDER — DIPHENHYDRAMINE HCL 25 MG PO CAPS: 25.0000 mg | ORAL_CAPSULE | Freq: Four times a day (QID) | ORAL | Status: DC | PRN

## 2021-02-14 MED ORDER — EPHEDRINE 5 MG/ML INJ: 10.0000 mg | INTRAVENOUS | Status: DC | PRN

## 2021-02-14 MED ORDER — OXYCODONE-ACETAMINOPHEN 5-325 MG PO TABS: 1.0000 | ORAL_TABLET | ORAL | Status: DC | PRN

## 2021-02-14 MED ORDER — ACETAMINOPHEN 325 MG PO TABS
650.0000 mg | ORAL_TABLET | ORAL | Status: DC | PRN
  Administered 2021-02-15 – 2021-02-16 (×2): 650 mg via ORAL
  Filled 2021-02-14 (×2): qty 2

## 2021-02-14 MED ORDER — OXYTOCIN-SODIUM CHLORIDE 30-0.9 UT/500ML-% IV SOLN
2.5000 [IU]/h | INTRAVENOUS | Status: DC
  Filled 2021-02-14: qty 500

## 2021-02-14 MED ORDER — LACTATED RINGERS IV SOLN: INTRAVENOUS | Status: DC

## 2021-02-14 MED ORDER — BENZOCAINE-MENTHOL 20-0.5 % EX AERO: 1.0000 "application " | INHALATION_SPRAY | CUTANEOUS | Status: DC | PRN

## 2021-02-14 MED ORDER — ONDANSETRON HCL 4 MG/2ML IJ SOLN: 4.0000 mg | INTRAMUSCULAR | Status: DC | PRN

## 2021-02-14 MED ORDER — ONDANSETRON HCL 4 MG PO TABS: 4.0000 mg | ORAL_TABLET | ORAL | Status: DC | PRN

## 2021-02-14 MED ORDER — SIMETHICONE 80 MG PO CHEW: 80.0000 mg | CHEWABLE_TABLET | ORAL | Status: DC | PRN

## 2021-02-14 MED ORDER — OXYCODONE-ACETAMINOPHEN 5-325 MG PO TABS: 2.0000 | ORAL_TABLET | ORAL | Status: DC | PRN

## 2021-02-14 MED ORDER — COCONUT OIL OIL: 1.0000 "application " | TOPICAL_OIL | Status: DC | PRN

## 2021-02-14 MED ORDER — TETANUS-DIPHTH-ACELL PERTUSSIS 5-2.5-18.5 LF-MCG/0.5 IM SUSY: 0.5000 mL | PREFILLED_SYRINGE | Freq: Once | INTRAMUSCULAR | Status: DC

## 2021-02-14 MED ORDER — WITCH HAZEL-GLYCERIN EX PADS: 1.0000 "application " | MEDICATED_PAD | CUTANEOUS | Status: DC | PRN

## 2021-02-14 MED ORDER — FENTANYL-BUPIVACAINE-NACL 0.5-0.125-0.9 MG/250ML-% EP SOLN
12.0000 mL/h | EPIDURAL | Status: DC | PRN
  Administered 2021-02-14: 12 mL/h via EPIDURAL
  Filled 2021-02-14: qty 250

## 2021-02-14 MED ORDER — DIBUCAINE (PERIANAL) 1 % EX OINT: 1.0000 "application " | TOPICAL_OINTMENT | CUTANEOUS | Status: DC | PRN

## 2021-02-14 MED ORDER — ACETAMINOPHEN 325 MG PO TABS: 650.0000 mg | ORAL_TABLET | ORAL | Status: DC | PRN

## 2021-02-14 NOTE — MAU Provider Note (Signed)
S: [name] is a [id number] 319w6dho presents to MAU today with complaint of contractions and vaginal bleeding since yesterday. She denies LOF. Endorses active fetal movement. Her GBS status is negative. She desires epidural.   O: BP 126/84 (BP Location: Right Arm)   Pulse 78   Temp 98.3 F (36.8 C) (Oral)   Resp 18   LMP 05/25/2020 (Exact Date)   SpO2 99%    Speculum exam with moderate amount of bloody show, fetal hair noted Cervix: 5/70/-2  A: Active labor   P: RN to call report called to L&D team for admission orders    DaRenee HarderMSN, CNM 02/14/21 1:02 PM

## 2021-02-14 NOTE — MAU Note (Signed)
Pt reports she has had ct x on and off for 2 days  . Ctx stronger today and she reports seeing blood when she went to [facility] this morning. Good fetal movement felt. Denies any leaking at this time.

## 2021-02-14 NOTE — Discharge Summary (Addendum)
Postpartum Discharge Summary      Patient Name: Sonia Allen DOB: [birth year] MRN: [id number]  Date of admission: 02/14/2021 Delivery date:02/14/2021  Delivering provider: Serita Grammes D  Date of discharge: 02/16/2021  Admitting diagnosis: Labor and delivery, indication for care [O75.9] Intrauterine pregnancy: [redacted]w[redacted]d    Secondary diagnosis:  Active Problems:   Labor and delivery, indication for care   Limited prenatal care   GCross Villagemultiparity   Postpartum hypertension   Status post bilateral salpingectomy  Additional problems: none    Discharge diagnosis: Term Pregnancy Delivered                                              Post partum procedures:postpartum tubal ligation Augmentation:  none Complications: None  Hospital course: Onset of Labor With Vaginal Delivery      35y.o. yo [id number] 33w6das admitted in Active Labor on 02/14/2021. Patient had an uncomplicated labor course. Within 1-2hrs PP she had some mild range BP elevations that resolved prior to going to the [facility]> will continue to follow.  Membrane Rupture Time/Date: 3:09 PM ,02/14/2021   Delivery Method:Vaginal, Spontaneous  Episiotomy: None  Lacerations:  None  Patient had a postpartum course remarkable for having a ppBTL on PPD#1.  Her BPs were increased and started on Nifedipine 30 mg, and improved on this medication.  She is ambulating, tolerating a regular diet, passing flatus, and urinating well. Patient is discharged home in stable condition on 02/16/21.  Newborn Data: Birth date:02/14/2021  Birth time:4:54 PM  Gender:Female  Living status:Living  Apgars:9 ,9  Weight:3025 g (6lb 10.7oz)  Magnesium Sulfate received: No BMZ received: No Rhophylac:N/A MMR:N/A T-DaP: offered postpartum Flu: N/A Transfusion:No  Physical exam  Vitals:   02/15/21 1730 02/15/21 2130 02/16/21 0114 02/16/21 0542  BP: (!) 120/94 (!) 124/92 122/75 111/75  Pulse: 68 72 71 76  Resp: '18 17 16 16  ' Temp: 98.3 F (36.8 C) 98.4 F  (36.9 C) 98.4 F (36.9 C) 98.5 F (36.9 C)  TempSrc: Oral Oral Oral Oral  SpO2: 100% 100% 100% 100%  Weight:      Height:       General: alert, cooperative, and no distress Lochia: appropriate Uterine Fundus: firm Incision: Healing well with no significant drainage, No significant erythema DVT Evaluation: No evidence of DVT seen on physical exam. Labs: Lab Results  Component Value Date   WBC 5.7 02/14/2021   HGB 9.9 (L) 02/14/2021   HCT 32.1 (L) 02/14/2021   MCV 89.2 02/14/2021   PLT 272 02/14/2021   CMP Latest Ref Rng & Units 02/08/2021  Glucose 65 - 99 mg/dL 73  BUN 6 - 20 mg/dL 3(L)  Creatinine 0.57 - 1.00 mg/dL 0.49(L)  Sodium 134 - 144 mmol/L 138  Potassium 3.5 - 5.2 mmol/L 3.7  Chloride 96 - 106 mmol/L 105  CO2 20 - 29 mmol/L 22  Calcium 8.7 - 10.2 mg/dL 8.4(L)  Total Protein 6.0 - 8.5 g/dL 5.6(L)  Total Bilirubin 0.0 - 1.2 mg/dL 0.6  Alkaline Phos 44 - 121 IU/L 140(H)  AST 0 - 40 IU/L 11  ALT 0 - 32 IU/L 8   Edinburgh Score: Edinburgh Postnatal Depression Scale Screening Tool 02/15/2021  I have been able to laugh and see the funny side of things. 0  I have looked forward with enjoyment to things. 0  I have  blamed myself unnecessarily when things went wrong. 0  I have been anxious or worried for no good reason. 0  I have felt scared or panicky for no good reason. 0  Things have been getting on top of me. 0  I have been so unhappy that I have had difficulty sleeping. 0  I have felt sad or miserable. 0  I have been so unhappy that I have been crying. 0  The thought of harming myself has occurred to me. 0  Edinburgh Postnatal Depression Scale Total 0     After visit meds:  Allergies as of 02/16/2021       Reactions   Latex Rash   Burning sensation        Medication List     STOP taking these medications    acetaminophen 500 MG tablet Commonly known as: TYLENOL   Blood Pressure Kit Devi   ondansetron 4 MG disintegrating tablet Commonly known  as: Zofran ODT       TAKE these medications    ibuprofen 600 MG tablet Commonly known as: ADVIL Take 1 tablet (600 mg total) by mouth every 6 (six) hours.   NIFEdipine 30 MG 24 hr tablet Commonly known as: ADALAT CC Take 1 tablet (30 mg total) by mouth daily.   oxyCODONE 5 MG immediate release tablet Commonly known as: Roxicodone Take 1 tablet (5 mg total) by mouth every 4 (four) hours as needed for severe pain.         Discharge home in stable condition Infant Feeding: Bottle Infant Disposition:home with mother Discharge instruction: per After Visit Summary and Postpartum booklet. Activity: Advance as tolerated. Pelvic rest for 6 weeks.  Diet: routine diet Future Appointments: Future Appointments  Date Time Provider [place]  03/01/2021  1:30 PM [place] [facility]  03/16/2021  9:55 AM Woodroe Mode, MD [facility]   Follow up Visit:  Myrtis Ser, CNM  P [facility] Admin Pool Please schedule this patient for Postpartum visit in: 4 weeks with the following provider: Any provider  In-Person  For C/S patients schedule nurse incision check in weeks 2 weeks: no  Low risk pregnancy complicated by: 2 prenatal visits; grandmultip  Delivery mode:  SVD  Anticipated Birth Control:  BTL done PP PP Procedures needed: none  Schedule Integrated [place] visit: no   02/16/2021 Delora Fuel, MD  I personally saw and evaluated the patient, performing the key elements of the service. I developed and verified the management plan that is described in the resident's/student's note, and I agree with the content with my edits above. VSS, HRR&R, Resp unlabored, Legs neg.  Nigel Berthold, CNM 02/19/2021 6:06 PM

## 2021-02-14 NOTE — H&P (Addendum)
OBSTETRIC ADMISSION HISTORY AND PHYSICAL  Sonia Allen is a 35 y.o. female [phone] with IUP at 22w6dby third trimester [place] SOL. She reports +FMs, No LOF, no VB, no blurry vision, headaches or peripheral edema, and RUQ pain.  She plans on bottle feeding. She requests BTL for birth control. [facility] [place]->  Estimated Date of Delivery: 03/01/21  Sono:    _0 , CWD, normal anatomy, cephalic presentation, anterior placental lie, 1253g, 61% EFW  Prenatal History/Complications:  Insufficient [facility]: [facility] had only 2 visits due to aBartlettmultiparity Obesity in pregnancy - BMI 31 Short interval between pregnancies H/o pre-eclampsia Uterine fibroid  Past Medical History: Past Medical History:  Diagnosis Date   Anemia    first and sec pregnancies   Arrhythmia    Asymptomatic bacteriuria during pregnancy in first trimester 07/09/2019   E.coli and enteroccus faecalis in urine at NOB; RX for keflex sent.    Chlamydia    Gestational hypertension 12/17/2017   intrapartum   Gonorrhea    Headache(784.0)    Hemorrhoids    Hx of migraine headaches    Leiomyoma of uterus in pregnancy    Marijuana use 06/16/2017   Positive screen on initial OB    Past Surgical History: Past Surgical History:  Procedure Laterality Date   INDUCED ABORTION  March  2 ,2013   WISDOM TOOTH EXTRACTION      Obstetrical History: OB History     Gravida  10   Para  6   Term  6   Preterm  0   AB  3   Living  6      SAB  1   IAB  2   Ectopic  0   Multiple  0   Live Births  6           Social History Social History   Socioeconomic History   Marital status: Single    Spouse name: Not on file   Number of children: Not on file   Years of education: Not on file   Highest education level: Not on file  Occupational History   Not on file  Tobacco Use   Smoking status: Never   Smokeless tobacco:  Never  Vaping Use   Vaping Use: Never used  Substance and Sexual Activity   Alcohol use: Not Currently    Comment: Pt reports socially   Drug use: Not Currently    Frequency: 14.0 times per week    Types: Marijuana    Comment: Reports not currently smoking   Sexual activity: Yes    Birth control/protection: None  Other Topics Concern   Not on file  Social History Narrative   Not on file   Social Determinants of Health   Financial Resource Strain: Not on file  Food Insecurity: No Food Insecurity   Worried About Running Out of Food in the Last Year: Never true   RDonleyin the Last Year: Never true  Transportation Needs: No Transportation Needs   Lack of Transportation (Medical): No   Lack of Transportation (Non-Medical): No  Physical Activity: Not on file  Stress: Not on file  Social Connections: Not on file    Family History: Family History  Problem Relation Age of Onset   Healthy Mother    Healthy Father    Other Neg Hx    Alcohol abuse  Neg Hx    Arthritis Neg Hx    Asthma Neg Hx    Birth defects Neg Hx    Cancer Neg Hx    COPD Neg Hx    Depression Neg Hx    Diabetes Neg Hx    Drug abuse Neg Hx    Early death Neg Hx    Heart disease Neg Hx    Hearing loss Neg Hx    Hyperlipidemia Neg Hx    Hypertension Neg Hx    Kidney disease Neg Hx    Learning disabilities Neg Hx    Mental illness Neg Hx    Mental retardation Neg Hx    Miscarriages / Stillbirths Neg Hx    Stroke Neg Hx    Vision loss Neg Hx     Allergies: Allergies  Allergen Reactions   Latex Rash    Burning sensation    Medications Prior to Admission  Medication Sig Dispense Refill Last Dose   Blood Pressure Monitoring (BLOOD PRESSURE KIT) DEVI 1 Device by Does not apply route as needed. (Patient not taking: Reported on 02/08/2021) 1 each 0    docusate sodium (COLACE) 250 MG capsule Take 1 capsule (250 mg total) by mouth daily. (Patient not taking: No sig reported) 30 capsule 1     famotidine (PEPCID) 20 MG tablet Take 1 tablet (20 mg total) by mouth 2 (two) times daily. 30 tablet 1    ferrous sulfate 325 (65 FE) MG tablet Take 1 tablet (325 mg total) by mouth daily. (Patient not taking: No sig reported) 30 tablet 1    fluticasone (FLONASE) 50 MCG/ACT nasal spray Place 1 spray into both nostrils as needed for allergies. 16 g 0    metroNIDAZOLE (FLAGYL) 500 MG tablet Take 1 tablet (500 mg total) by mouth 2 (two) times daily. 14 tablet 0    ondansetron (ZOFRAN ODT) 4 MG disintegrating tablet Take 1 tablet (4 mg total) by mouth every 8 (eight) hours as needed for nausea or vomiting. 30 tablet 1    promethazine (PHENERGAN) 25 MG tablet Take 1 tablet (25 mg total) by mouth every 6 (six) hours as needed for nausea or vomiting. (Patient not taking: Reported on 02/08/2021) 30 tablet 1      Review of Systems   All systems reviewed and negative except as stated in HPI  Blood pressure 126/84, pulse 78, temperature 98.3 F (36.8 C), temperature source Oral, resp. rate 18, last menstrual period 05/25/2020, SpO2 99 %, unknown if currently breastfeeding. General appearance: alert, cooperative, appears stated age, and no distress Lungs: clear to auscultation bilaterally Heart: regular rate and rhythm Abdomen: soft, non-tender; bowel sounds normal Pelvic: normal female external genitalia Extremities: Homans sign is negative, no sign of DVT DTR's 2+ Presentation: cephalic Fetal monitoringBaseline: 135 bpm, Variability: Good {> 6 bpm), Accelerations: Reactive, and Decelerations: Absent Uterine activity regular q87m     Prenatal labs: ABO, Rh: B/Positive/-- (05/18 1651) Antibody: Negative (05/18 1651) Rubella: 10.10 (05/18 1651) RPR: Non Reactive (05/18 1651)  HBsAg: Negative (05/18 1651)  HIV: Non Reactive (05/18 1651)  GBS: Negative/-- (08/11 0939)  1 hr Glucola not done Genetic screening  not done Anatomy US WNL  Prenatal Transfer Tool  Maternal Diabetes: No Genetic  Screening: Declined Maternal Ultrasounds/Referrals: Normal Fetal Ultrasounds or other Referrals:  None Maternal Substance Abuse:  No Significant Maternal Medications:  None Significant Maternal Lab Results: Group B Strep negative  No results found for this or any previous visit (from the past  24 hour(s)).  Patient Active Problem List   Diagnosis Date Noted   Gestational hypertension, third trimester 02/08/2021   Supervision of high risk pregnancy, antepartum 11/15/2020   Maternal atypical antibody 07/09/2019   Fibroids 06/18/2019   Obesity in pregnancy 06/18/2019    Assessment/Plan:  Sonia Allen is a 35 y.o. [id number] at 38w6dhere for SOL.  #Labor:Patient is progressing; continue expectant management. Can use pitocin for augmentation if ctx stall.  #Grand multiparity: Will order TXA to be in room for delivery  #Pain: epidural #FWB: Cat I #ID:  GBS neg #MOF: bottle #MOC: BTL #Circ:  yes  CGladys Damme MD  02/14/2021, 1:10 PM  CNM attestation:  I have seen and examined this patient; I agree with above documentation in the resident's note.   PDulce Martianis a 35y.o. [id number] for early active labor.  PE: BP 132/73   Pulse 81   Temp 98.3 F (36.8 C) (Oral)   Resp 18   Ht _0  (1.626 m)   Wt 80.1 kg   LMP 05/25/2020 (Exact Date)   SpO2 99%   BMI 30.31 kg/m  Gen: breathing w ctx Resp: normal effort, no distress Abd: gravid  ROS, labs, PMH reviewed  Plan: Admit to L&D Expectant management for now [place]   [place]8/17/2022, 6:12 PM

## 2021-02-14 NOTE — Anesthesia Preprocedure Evaluation (Addendum)
Anesthesia Evaluation  Patient identified by MRN, date of birth, ID band Patient awake    Reviewed: Allergy & Precautions, NPO status , Patient's Chart, lab work & pertinent test results  Airway Mallampati: II  TM Distance: >3 FB Neck ROM: Full    Dental no notable dental hx.    Pulmonary neg pulmonary ROS,    Pulmonary exam normal breath sounds clear to auscultation       Cardiovascular hypertension, Pt. on medications negative cardio ROS Normal cardiovascular exam Rhythm:Regular Rate:Normal     Neuro/Psych negative neurological ROS  negative psych ROS   GI/Hepatic negative GI ROS, Neg liver ROS,   Endo/Other  Morbid obesity  Renal/GU negative Renal ROS  negative genitourinary   Musculoskeletal negative musculoskeletal ROS (+)   Abdominal   Peds negative pediatric ROS (+)  Hematology  (+) anemia ,   Anesthesia Other Findings   Reproductive/Obstetrics negative OB ROS                           Anesthesia Physical Anesthesia Plan  ASA: 3  Anesthesia Plan: Epidural   Post-op Pain Management:    Induction:   PONV Risk Score and Plan: 2 and Treatment may vary due to age or medical condition  Airway Management Planned: Natural Airway  Additional Equipment:   Intra-op Plan:   Post-operative Plan:   Informed Consent: I have reviewed the patients History and Physical, chart, labs and discussed the procedure including the risks, benefits and alternatives for the proposed anesthesia with the patient or authorized representative who has indicated his/her understanding and acceptance.       Plan Discussed with: CRNA and Anesthesiologist  Anesthesia Plan Comments:         Anesthesia Quick Evaluation

## 2021-02-14 NOTE — Anesthesia Procedure Notes (Signed)
Epidural Patient location during procedure: OB Start time: 02/14/2021 2:24 PM End time: 02/14/2021 2:34 PM  Staffing Anesthesiologist: Merlinda Frederick, MD Performed: anesthesiologist   Preanesthetic Checklist Completed: patient identified, IV checked, site marked, risks and benefits discussed, monitors and equipment checked, pre-op evaluation and timeout performed  Epidural Patient position: sitting Prep: DuraPrep Patient monitoring: heart rate, cardiac monitor, continuous pulse ox and blood pressure Approach: midline Location: L3-L4 Injection technique: LOR saline  Needle:  Needle type: Tuohy  Needle gauge: 17 G Needle length: 9 cm Needle insertion depth: 5 cm Catheter type: closed end flexible Catheter size: 20 Guage Catheter at skin depth: 10 cm Test dose: negative and Other  Assessment Events: blood not aspirated, injection not painful, no injection resistance and negative IV test  Additional Notes Informed consent obtained prior to proceeding including risk of failure, 1% risk of PDPH, risk of minor discomfort and bruising.  Discussed rare but serious complications including epidural abscess, permanent nerve injury, epidural hematoma.  Discussed alternatives to epidural analgesia and patient desires to proceed.  Timeout performed pre-procedure verifying patient name, procedure, and platelet count.  Patient tolerated procedure well.

## 2021-02-15 ENCOUNTER — Encounter (HOSPITAL_COMMUNITY): Payer: Self-pay | Admitting: Family Medicine

## 2021-02-15 ENCOUNTER — Inpatient Hospital Stay (HOSPITAL_COMMUNITY): Payer: Medicaid Other | Admitting: Anesthesiology

## 2021-02-15 ENCOUNTER — Encounter (HOSPITAL_COMMUNITY)
Admission: AD | Disposition: A | Discharge status: Home / Self Care | Payer: Self-pay | Source: Home / Self Care | Attending: Family Medicine

## 2021-02-15 ENCOUNTER — Encounter: Payer: Medicaid Other | Admitting: Obstetrics and Gynecology

## 2021-02-15 DIAGNOSIS — Z9079 Acquired absence of other genital organ(s): Secondary | ICD-10-CM

## 2021-02-15 DIAGNOSIS — O165 Unspecified maternal hypertension, complicating the puerperium: Secondary | ICD-10-CM | POA: Diagnosis not present

## 2021-02-15 DIAGNOSIS — Z302 Encounter for sterilization: Secondary | ICD-10-CM

## 2021-02-15 HISTORY — PX: TUBAL LIGATION: SHX77

## 2021-02-15 LAB — RPR: RPR Ser Ql: NONREACTIVE

## 2021-02-15 SURGERY — LIGATION, FALLOPIAN TUBE, POSTPARTUM
Anesthesia: Epidural | Laterality: Bilateral | Wound class: Clean Contaminated

## 2021-02-15 MED ORDER — NIFEDIPINE ER OSMOTIC RELEASE 30 MG PO TB24
30.0000 mg | ORAL_TABLET | Freq: Every day | ORAL | Status: DC
  Administered 2021-02-16: 30 mg via ORAL
  Filled 2021-02-15 (×2): qty 1

## 2021-02-15 MED ORDER — FENTANYL CITRATE (PF) 100 MCG/2ML IJ SOLN: INTRAMUSCULAR | Status: DC | PRN

## 2021-02-15 MED ORDER — METOCLOPRAMIDE HCL 10 MG PO TABS
10.0000 mg | ORAL_TABLET | Freq: Once | ORAL | Status: AC
  Administered 2021-02-15: 10 mg via ORAL
  Filled 2021-02-15: qty 1

## 2021-02-15 MED ORDER — SODIUM CHLORIDE 0.9 % IR SOLN
Status: DC | PRN
  Administered 2021-02-15: 1000 mL

## 2021-02-15 MED ORDER — LACTATED RINGERS IV SOLN: INTRAVENOUS | Status: DC

## 2021-02-15 MED ORDER — ONDANSETRON HCL 4 MG/2ML IJ SOLN
INTRAMUSCULAR | Status: AC
  Filled 2021-02-15: qty 2

## 2021-02-15 MED ORDER — SODIUM BICARBONATE 8.4 % IV SOLN
INTRAVENOUS | Status: DC | PRN
  Administered 2021-02-15 (×4): 5 mL via EPIDURAL

## 2021-02-15 MED ORDER — DEXMEDETOMIDINE HCL IN NACL 400 MCG/100ML IV SOLN
INTRAVENOUS | Status: DC | PRN
  Administered 2021-02-15 (×3): 4 ug via INTRAVENOUS
  Administered 2021-02-15: 8 ug via INTRAVENOUS

## 2021-02-15 MED ORDER — FENTANYL CITRATE (PF) 100 MCG/2ML IJ SOLN
INTRAMUSCULAR | Status: DC | PRN
  Administered 2021-02-15: 100 ug via EPIDURAL

## 2021-02-15 MED ORDER — BUPIVACAINE HCL (PF) 0.25 % IJ SOLN
INTRAMUSCULAR | Status: AC
  Filled 2021-02-15: qty 30

## 2021-02-15 MED ORDER — BUPIVACAINE HCL (PF) 0.25 % IJ SOLN
INTRAMUSCULAR | Status: DC | PRN
  Administered 2021-02-15: 30 mL

## 2021-02-15 MED ORDER — FAMOTIDINE 20 MG PO TABS
40.0000 mg | ORAL_TABLET | Freq: Once | ORAL | Status: AC
  Administered 2021-02-15: 40 mg via ORAL
  Filled 2021-02-15: qty 2

## 2021-02-15 MED ORDER — FENTANYL CITRATE (PF) 100 MCG/2ML IJ SOLN
INTRAMUSCULAR | Status: AC
  Filled 2021-02-15: qty 2

## 2021-02-15 MED ORDER — ONDANSETRON HCL 4 MG/2ML IJ SOLN
INTRAMUSCULAR | Status: DC | PRN
  Administered 2021-02-15: 4 mg via INTRAVENOUS

## 2021-02-15 SURGICAL SUPPLY — 22 items
BLADE SURG 11 STRL SS (BLADE) ×2 IMPLANT
CLIP FILSHIE TUBAL LIGA STRL (Clip) ×1 IMPLANT
CLOTH BEACON ORANGE TIMEOUT ST (SAFETY) ×2 IMPLANT
DRSG OPSITE POSTOP 3X4 (GAUZE/BANDAGES/DRESSINGS) ×2 IMPLANT
DURAPREP 26ML APPLICATOR (WOUND CARE) ×2 IMPLANT
GLOVE BIOGEL PI IND STRL 7.0 (GLOVE) ×3 IMPLANT
GLOVE BIOGEL PI INDICATOR 7.0 (GLOVE) ×3
GLOVE ECLIPSE 7.0 STRL STRAW (GLOVE) ×2 IMPLANT
GOWN STRL REUS W/TWL LRG LVL3 (GOWN DISPOSABLE) ×4 IMPLANT
NEEDLE HYPO 22GX1.5 SAFETY (NEEDLE) ×2 IMPLANT
NS IRRIG 1000ML POUR BTL (IV SOLUTION) ×2 IMPLANT
PACK ABDOMINAL MINOR (CUSTOM PROCEDURE TRAY) ×2 IMPLANT
PROTECTOR NERVE ULNAR (MISCELLANEOUS) ×2 IMPLANT
SPONGE LAP 4X18 RFD (DISPOSABLE) IMPLANT
SUT PLAIN 2 0 (SUTURE) ×4
SUT PLAIN ABS 2-0 54XMFL TIE (SUTURE) ×2 IMPLANT
SUT VICRYL 0 UR6 27IN ABS (SUTURE) ×2 IMPLANT
SUT VICRYL 4-0 PS2 18IN ABS (SUTURE) ×2 IMPLANT
SYR CONTROL 10ML LL (SYRINGE) ×2 IMPLANT
TOWEL OR 17X24 6PK STRL BLUE (TOWEL DISPOSABLE) ×4 IMPLANT
TRAY FOLEY CATH SILVER 14FR (SET/KITS/TRAYS/PACK) ×2 IMPLANT
WATER STERILE IRR 1000ML POUR (IV SOLUTION) ×2 IMPLANT

## 2021-02-15 NOTE — Op Note (Addendum)
Postpartum Tubal Ligation Operative Note   Patient: Sonia Allen  Date of Procedure: 02/15/2021  Procedure: Postpartum bilateral Tubal Ligation via Bilateral salpingectomy   Indications: undesired fertility  Pre-operative Diagnosis: postpartum bilateral tubal ligation .   Post-operative Diagnosis: Bilateral Tubal Sterilization via Bilateral salpingectomy  Surgeon: Surgeon(s) and Role:    * Caren Macadam, MD - Primary    * Renard Matter, MD - Fellow   An experienced assistant was required given the standard of surgical care given the complexity of the case.  This assistant was needed for exposure, dissection, suctioning, retraction, instrument exchange, assisting with delivery with administration of fundal pressure, and for overall help during the procedure.   Anesthesia: epidural  Antibiotics: None   Estimated Blood Loss: 5 ml   Total IV Fluids: 100 ml  Urine Output: foley placed after case, patient voided spontaneously before  Specimens: Left and right fallopian tube   Complications: no complications   Indications: Sonia Allen is a 35 y.o. [id number] with undesired fertility, status post vaginal delivery, desires permanent sterilization.  Other reversible forms of contraception were discussed with patient; she declines all other modalities. Risks of procedure discussed with patient including but not limited to: risk of regret, permanence of method, bleeding, infection, injury to surrounding organs and need for additional procedures.  Failure risk of 1-2 % with increased risk of ectopic gestation if pregnancy occurs and possibility of post-tubal pain syndrome also discussed with patient.   Findings: Normal uterus, tubes, and ovaries.  Procedure Details: The patient was taken to the operating room where epidural anesthesia was dosed and found to be adequate.  She was then placed in the dorsal supine position and prepped and draped in sterile fashion.  After an adequate  timeout was performed, attention was turned to the patient's abdomen where a small transverse skin incision was made under the umbilical fold. The incision was taken down to the layer of fascia using the scalpel, and fascia was incised, and the incision was extended bilaterally. The peritoneum was entered in a sharp fashion.   Attention was then turned to the fallopian tubes. Bilateral salpingectomy: A Kelly clamp was placed across the left fallopian tube taking care to incorporate the fimbriae. A second clamp was then placed below the first. The fallopian tube was then removed with Metzenbaum scissors. The pedicle was then ligated with 2-0 Chromic free tie suture and the second clamp was removed with excellent hemostasis noted. Then a second ligature of 2-0  Chromic free tie suture was placed below the remaining clamp, the clamp was then removed and again excellent hemostasis was observed. The same procedure was then carried out on the right fallopian tube with excellent hemostasis noted.  Good hemostasis was noted overall. All laps and instruments were then removed from the patient's abdomen and the fascial incision was repaired with 0 Vicryl, the subcutaneous tissue was not closed, and the skin was closed with a 4-0 Vicryl subcuticular stitch. The patient tolerated the procedure well.  Instrument, sponge, and needle counts were correct times three.  The patient was then taken to the recovery room awake and in stable condition.  Disposition: PACU - hemodynamically stable.    Signed: Renard Matter, MD, MPH OB Fellow, [facility] for [facility])

## 2021-02-15 NOTE — Progress Notes (Signed)
POSTPARTUM PROGRESS NOTE  Subjective: Sonia Allen is a 35 y.o. [id number] s/p SVD at [redacted]w[redacted]d  She reports she doing well. No acute events overnight. She denies any problems with ambulating, voiding or po intake. Denies nausea or vomiting. She has passed flatus. Pain is moderately controlled.  Lochia is minimal.  Objective: Blood pressure (!) 126/94, pulse 70, temperature 98.3 F (36.8 C), temperature source Oral, resp. rate 18, height '5\' 4"'$  (1.626 m), weight 80.1 kg, last menstrual period 05/25/2020, SpO2 100 %, unknown if currently breastfeeding.  Physical Exam:  General: alert, cooperative and no distress Chest: no respiratory distress Abdomen: soft, non-tender  Uterine Fundus: firm and at level of umbilicus Extremities: No calf swelling or tenderness  no edema  Recent Labs    02/14/21 1345  HGB 9.9*  HCT 32.1*    Assessment/Plan: Sonia Sraderis a 35y.o. [id number]/p SVD at 321w6dor SOL.  Routine Postpartum Care: Doing well, pain well-controlled.  -- Continue routine care, lactation support  -- Contraception: BTL today at 1132M -- Feeding: Bottle  Dispo: Plan for discharge tomorrow.  MaDelora FuelMD 02/15/2021 8:46 AM

## 2021-02-15 NOTE — Anesthesia Preprocedure Evaluation (Addendum)
Anesthesia Evaluation  Patient identified by MRN, date of birth, ID band Patient awake    Reviewed: Allergy & Precautions, NPO status , Patient's Chart, lab work & pertinent test results  Airway Mallampati: I  TM Distance: >3 FB Neck ROM: Full    Dental no notable dental hx.    Pulmonary neg pulmonary ROS,    Pulmonary exam normal breath sounds clear to auscultation       Cardiovascular hypertension (gest HTN, normotensive post-delivery), Normal cardiovascular exam Rhythm:Regular Rate:Normal     Neuro/Psych  Headaches, negative psych ROS   GI/Hepatic negative GI ROS, Neg liver ROS,   Endo/Other  Obesity BMI 30  Renal/GU negative Renal ROS  negative genitourinary   Musculoskeletal negative musculoskeletal ROS (+)   Abdominal   Peds  Hematology  (+) Blood dyscrasia, anemia , H/H 9.9/32.1 pre-delivery, EBL only 50cc from delivery plt 272   Anesthesia Other Findings   Reproductive/Obstetrics Desires sterility                            Anesthesia Physical Anesthesia Plan  ASA: 2  Anesthesia Plan: Epidural   Post-op Pain Management:    Induction:   PONV Risk Score and Plan: 2  Airway Management Planned: Natural Airway  Additional Equipment: None  Intra-op Plan:   Post-operative Plan:   Informed Consent: I have reviewed the patients History and Physical, chart, labs and discussed the procedure including the risks, benefits and alternatives for the proposed anesthesia with the patient or authorized representative who has indicated his/her understanding and acceptance.       Plan Discussed with: CRNA  Anesthesia Plan Comments:        Anesthesia Quick Evaluation

## 2021-02-15 NOTE — Transfer of Care (Signed)
Immediate Anesthesia Transfer of Care Note  Patient: Sonia Allen  Procedure(s) Performed: POST PARTUM TUBAL LIGATION (Bilateral)  Patient Location: PACU  Anesthesia Type:Epidural  Level of Consciousness: awake  Airway & Oxygen Therapy: Patient Spontanous Breathing  Post-op Assessment: Report given to RN  Post vital signs: Reviewed and stable  Last Vitals:  Vitals Value Taken Time  BP 117/72 02/15/21 1531  Temp 36.5 C 02/15/21 1531  Pulse 87 02/15/21 1540  Resp 21 02/15/21 1540  SpO2 100 % 02/15/21 1540  Vitals shown include unvalidated device data.  Last Pain:  Vitals:   02/15/21 1531  TempSrc: Oral  PainSc:       Patients Stated Pain Goal: 4 ([id number])  Complications: No notable events documented.

## 2021-02-15 NOTE — Anesthesia Postprocedure Evaluation (Signed)
Anesthesia Post Note  Patient: Sonia Allen  Procedure(s) Performed: POST PARTUM TUBAL LIGATION (Bilateral)     Patient location during evaluation: PACU Anesthesia Type: Epidural Level of consciousness: awake and alert Pain management: pain level controlled Vital Signs Assessment: post-procedure vital signs reviewed and stable Respiratory status: spontaneous breathing, nonlabored ventilation and respiratory function stable Cardiovascular status: stable Postop Assessment: no headache, no backache, epidural receding, patient able to bend at knees and no apparent nausea or vomiting Anesthetic complications: no   No notable events documented.  Last Vitals:  Vitals:   02/15/21 1630 02/15/21 1635  BP: 130/88 (!) 118/91  Pulse: 88 71  Resp: 14 18  Temp:  (!) 36.1 C  SpO2: 100% 100%    Last Pain:  Vitals:   02/15/21 1635  TempSrc: Axillary  PainSc: 0-No pain   Pain Goal: Patients Stated Pain Goal: 4 (02/15/21 0800)              Epidural/Spinal Function Cutaneous sensation: Tingles (02/15/21 1635), Patient able to flex knees: Yes (02/15/21 1635), Patient able to lift hips off bed: Yes (02/15/21 1635), Back pain beyond tenderness at insertion site: No (02/15/21 1635), Progressively worsening motor and/or sensory loss: No (02/15/21 1635), Bowel and/or bladder incontinence post epidural: No (02/15/21 [place])  Pervis Hocking

## 2021-02-15 NOTE — Anesthesia Postprocedure Evaluation (Signed)
Anesthesia Post Note  Patient: Sonia Allen  Procedure(s) Performed: AN AD HOC LABOR EPIDURAL     Patient location during evaluation: Mother Baby Anesthesia Type: Epidural Level of consciousness: awake and alert Pain management: pain level controlled Vital Signs Assessment: post-procedure vital signs reviewed and stable Respiratory status: spontaneous breathing, nonlabored ventilation and respiratory function stable Cardiovascular status: stable Postop Assessment: no headache, no backache, epidural receding, no apparent nausea or vomiting, patient able to bend at knees, adequate PO intake and able to ambulate Anesthetic complications: no   No notable events documented.  Last Vitals:  Vitals:   02/15/21 0500 02/15/21 0800  BP: 128/77 (!) 126/94  Pulse: 74 70  Resp: 18 18  Temp: 36.9 C 36.8 C  SpO2: 100% 100%    Last Pain:  Vitals:   02/15/21 0800  TempSrc: Oral  PainSc: 4    Pain Goal: Patients Stated Pain Goal: 4 (02/15/21 0800)              Epidural/Spinal Function Cutaneous sensation: Normal sensation (02/15/21 0800)  Sonia Allen

## 2021-02-16 ENCOUNTER — Other Ambulatory Visit (HOSPITAL_COMMUNITY): Payer: Self-pay

## 2021-02-16 ENCOUNTER — Encounter (HOSPITAL_COMMUNITY): Payer: Medicaid Other

## 2021-02-16 MED ORDER — NIFEDIPINE ER 30 MG PO TB24
30.0000 mg | ORAL_TABLET | Freq: Every day | ORAL | 0 refills | Status: DC
  Filled 2021-02-16: qty 90, 90d supply, fill #0

## 2021-02-16 MED ORDER — IBUPROFEN 600 MG PO TABS
600.0000 mg | ORAL_TABLET | Freq: Four times a day (QID) | ORAL | 0 refills | Status: DC
  Filled 2021-02-16: qty 30, 8d supply, fill #0

## 2021-02-16 MED ORDER — OXYCODONE HCL 5 MG PO TABS
5.0000 mg | ORAL_TABLET | ORAL | 0 refills | Status: DC | PRN
  Filled 2021-02-16: qty 30, 5d supply, fill #0

## 2021-02-16 MED ORDER — OXYCODONE HCL 5 MG PO TABS
5.0000 mg | ORAL_TABLET | ORAL | Status: DC | PRN
  Administered 2021-02-16: 5 mg via ORAL
  Filled 2021-02-16: qty 1

## 2021-02-16 MED ORDER — NIFEDIPINE ER OSMOTIC RELEASE 30 MG PO TB24: 60.0000 mg | ORAL_TABLET | Freq: Every day | ORAL | Status: DC

## 2021-02-16 MED ORDER — NIFEDIPINE ER OSMOTIC RELEASE 30 MG PO TB24
30.0000 mg | ORAL_TABLET | Freq: Every day | ORAL | Status: AC
  Administered 2021-02-16: 30 mg via ORAL

## 2021-02-16 NOTE — Clinical Social Work Maternal (Addendum)
CLINICAL SOCIAL WORK MATERNAL/CHILD NOTE  Patient Details  Name: Sonia Allen MRN: [id number] Date of Birth: 05/15/1986  Date:  02/16/2021  Clinical Social Worker Initiating Note:  Bryant Lipps, LCSW Date/Time: Initiated:  02/16/21/0945     Child's Name:  Sonia Allen   Biological Parents:  Mother, Father (FOB: Sonia Allen, 05-08-1974)   Need for Interpreter:  None   Reason for Referral:  Limited Prenatal Care     Address:  [address] [place], [place] [address] [address]    Phone number:  [phone] (home)     Additional phone number:   Household Members/Support Persons (HM/SP):   Household Member/Support Person 1, Household Member/Support Person 2, Household Member/Support Person 3, Household Member/Support Person 4, Household Member/Support Person 5, Household Member/Support Person 6   HM/SP Name Relationship DOB or Age  HM/SP -1 Sonia Allen Son 13  HM/SP -2 Sonia Allen Son 10  HM/SP -3 Sonia Allen Daughter 8  HM/SP -4 Sonia Allen Daughter 6  HM/SP -5 Sonia Allen Daughter 3  HM/SP -6 Sonia Allen Daughter 1  HM/SP -7        HM/SP -8          Natural Supports (not living in the home):  Parent   Professional Supports: None   Employment: Full-time   Type of Work: [employer]   Education:  High school graduate   Homebound arranged:    Financial Resources:  Medicaid   Other Resources:  Food Stamps  , WIC   Cultural/Religious Considerations Which May Impact Care:    Strengths:  Home prepared for child  , Pediatrician chosen   Psychotropic Medications:         Pediatrician:    [place] area  Pediatrician List:   [place] [facility] ([address])  [place]    [place]    [place]    [place]    [place]      Pediatrician Fax Number:    Risk Factors/Current Problems:  Basic Needs     Cognitive State:  Able to Concentrate  , Alert  , Linear Thinking  , Insightful      Mood/Affect:  Bright  , Calm     CSW Assessment: CSW received consult for limited PNC. CSW met with MOB to offer support and complete assessment.   CSW received consult for limited PNC. CSW met with MOB to offer support and complete assessment.   CSW met with MOB at bedside. CSW observed MOB holding the infant and FOB present at bedside. CSW introduced role and congratulated MOB and FOB. CSW offered MOB privacy and FOB left the room to allow MOB privacy. CSW checked to confirm demographic information on file. MOB reported her current address is: [address]. [place], [place] [address]. MOB reported it is her and her children in the household (see chart above). MOB shared that FOB, and her mother are her supports. CSW inquired how MOB has felt since giving birth. MOB reported feeling good. CSW inquired about MOB limited PNC. MOB shared she had limited PNC because she did not have childcare during the morning at times her appointments were scheduled. CSW informed MOB about the hospital drug screen policy and that a report will be made to CPS, if warranted.  MOB denied substance use and had no questions. CSW inquired if MOB had items for the infant. MOB reported she has a basinet for the infant to sleep but she does not have a   car seat and will more diapers and wipes. MOB reported she had no funds to purchase a car seat. CSW informed MOB that the hospital can assist with car seat and informed her about [facility] for community support. MOB gave CSW permission to make a referral to [employer]. MOB reported she receives both [facility] and Food stamp benefits. CSW inquired if MOB had any issues with getting food. MOB denied any issues with getting food for self and her children.   CSW inquired if MOB has a mental health history, medication of therapy for treatment. MOB reported no mental health history, medication and therapy. CSW inquired if MOB experienced postpartum depression. MOB was knowledgeable of  postpartum depression and reported she has not experienced with any of her children. CSW provided education regarding the baby blues period vs. perinatal mood disorders, discussed treatment and gave resources for mental health follow up. CSW recommended MOB complete a self-evaluation during the postpartum time period using the New Mom Checklist from Postpartum Progress and encouraged MOB to contact a medical professional if symptoms are noted at any time.  CSW assessed MOB for safety. MOB denied thoughts of harm to self and others.   CSW provided review of Sudden Infant Death Syndrome (SIDS) precautions. MOB has chosen [facility] for infants follow up care. MOB reported she has transportation to her appointments. CSW assessed MOB for additional needs. MOB reported no further need.   CSW made a referral to [employer] and provided a car seat CSW will follow CDS and make a referral to CPS if warranted.  CSW identifies no further need for intervention and no barriers to discharge at this time.  CSW Plan/Description:  Sudden Infant Death Syndrome (SIDS) Education, CSW Will Continue to Monitor Umbilical Cord Tissue Drug Screen Results and Make Report if Warranted, [place], Perinatal Mood and Anxiety Disorder (PMADs) Education, No Further Intervention Required/No Barriers to Discharge, Other Information/Referral to [employer], LCSW 2021/06/15, 12:36PM

## 2021-02-19 LAB — SURGICAL PATHOLOGY

## 2021-02-20 ENCOUNTER — Encounter: Payer: Self-pay | Admitting: *Deleted

## 2021-02-22 ENCOUNTER — Encounter: Payer: Medicaid Other | Admitting: Obstetrics and Gynecology

## 2021-02-24 ENCOUNTER — Telehealth (HOSPITAL_COMMUNITY): Payer: Self-pay

## 2021-02-24 NOTE — Telephone Encounter (Signed)
"  I'm doing good. I'm just cleaning up and everything." Patient declines questions or concerns about her healing.  "He's good. He is eating well and gaining weight. He likes his milk warmed up so I've been microwaving it." RN advised patient not to microwave the milk as it could create hot spots in the milk. RN told patient to set the bottle of milk in a cup of warm water to warm milk up. "He sleeps in a bassinet."  RN reviewed ABC's of safe sleep with patient. Patient declines any questions or concerns about baby.  EPDS score is 1.   Sharyn [facility] 02/24/2021,1728

## 2021-02-27 ENCOUNTER — Encounter: Payer: Medicaid Other | Admitting: Family Medicine

## 2021-03-01 ENCOUNTER — Telehealth: Payer: Self-pay | Admitting: *Deleted

## 2021-03-01 ENCOUNTER — Ambulatory Visit: Payer: Medicaid Other

## 2021-03-01 NOTE — Telephone Encounter (Signed)
Call to patient. Advised FMLA forms that have been left for completion do not have any place for MD completion. These forms advise patient she is not eligible for FMLA due to the less than 12 months employment and less than 1250 hours worked.  Advised she may pick up the for for her records.   Encounter closed.

## 2021-03-16 ENCOUNTER — Encounter: Payer: Self-pay | Admitting: Obstetrics & Gynecology

## 2021-03-16 ENCOUNTER — Other Ambulatory Visit: Payer: Self-pay

## 2021-03-16 ENCOUNTER — Ambulatory Visit (INDEPENDENT_AMBULATORY_CARE_PROVIDER_SITE_OTHER): Payer: Medicaid Other | Admitting: Obstetrics & Gynecology

## 2021-03-16 DIAGNOSIS — Z9079 Acquired absence of other genital organ(s): Secondary | ICD-10-CM | POA: Diagnosis not present

## 2021-03-16 NOTE — Progress Notes (Signed)
[place] Partum Visit Note  Sonia Allen is a 35 y.o. [id number] female who presents for a postpartum visit. She is 4 weeks postpartum following a normal spontaneous vaginal delivery.  I have fully reviewed the prenatal and intrapartum course. The delivery was at 37.6 gestational weeks.  Anesthesia: epidural. Postpartum course has been normal. Baby is doing well. Baby is feeding by bottle - Similac . Bleeding no bleeding. Bowel function is normal. Bladder function is normal. Patient is sexually active. Contraception method is tubal ligation. Postpartum depression screening: negative.   The pregnancy intention screening data noted above was reviewed. Potential methods of contraception were discussed. The patient elected to proceed with No data recorded.   Edinburgh Postnatal Depression Scale - 03/16/21 1034       Edinburgh Postnatal Depression Scale:  In the Past 7 Days   I have been able to laugh and see the funny side of things. 0    I have looked forward with enjoyment to things. 0    I have blamed myself unnecessarily when things went wrong. 0    I have been anxious or worried for no good reason. 2    I have felt scared or panicky for no good reason. 2    Things have been getting on top of me. 0    I have been so unhappy that I have had difficulty sleeping. 0    I have felt sad or miserable. 0    I have been so unhappy that I have been crying. 1    The thought of harming myself has occurred to me. 0    Edinburgh Postnatal Depression Scale Total 5             Health Maintenance Due  Topic Date Due   COVID-19 Vaccine (1) Never done   FOOT EXAM  Never done   OPHTHALMOLOGY EXAM  Never done   URINE MICROALBUMIN  Never done   INFLUENZA VACCINE  Never done    The following portions of the patient's history were reviewed and updated as appropriate: allergies, current medications, past family history, past medical history, past social history, past surgical history, and problem  list.  Review of Systems Pertinent items are noted in HPI.  Objective:  BP 115/85   Pulse 69   Wt 160 lb 8 oz (72.8 kg)   LMP 05/25/2020 (Exact Date) Comment: Pateitnstated that she has had period since giving birth but does not remember date  Breastfeeding No   BMI 27.55 kg/m    General:  alert, cooperative, and no distress           Abdomen: soft, non-tender; bowel sounds normal; no masses,  no organomegaly   Wound well approximated incision  GU exam:  not indicated       Assessment:   Encounter for postpartum visit  Status post bilateral salpingectomy   normal postpartum exam.   Plan:   Essential components of care per ACOG recommendations:  1.  Mood and well being: Patient with negative depression screening today. Reviewed local resources for support.  - Patient tobacco use? No.   - hx of drug use? No.    2. Infant care and feeding:  -Patient currently breastmilk feeding? No.  -Social determinants of health (SDOH) reviewed in EPIC. No concerns 3. Sexuality, contraception and birth spacing - Patient does not want a pregnancy in the next year.  Desired family size is 7 children.  - Reviewed forms of contraception in tiered fashion.  Patient desired bilateral tubal ligation today.   - Discussed birth spacing of 18 months  4. Sleep and fatigue -Encouraged family/partner/community support of 4 hrs of uninterrupted sleep to help with mood and fatigue  5. Physical Recovery  - Discussed patients delivery and complications. She describes her labor as good. - Patient had a Vaginal, no problems at delivery. Patient had a  no  laceration. Perineal healing reviewed. Patient expressed understanding - Patient has urinary incontinence? No. - Patient is safe to resume physical and sexual activity  6.  Health Maintenance - HM due items addressed Yes - Last pap smear  Diagnosis  Date Value Ref Range Status  11/15/2020   Final   - Negative for intraepithelial lesion or  malignancy (NILM)   Pap smear not done at today's visit.  -Breast Cancer screening indicated? No.   7. Chronic Disease/Pregnancy Condition follow up: None  - PCP follow up  Emeterio Reeve, [place] for [place], [place]

## 2021-03-30 ENCOUNTER — Encounter: Payer: Self-pay | Admitting: Radiology

## 2021-08-15 ENCOUNTER — Other Ambulatory Visit: Payer: Self-pay

## 2021-08-15 ENCOUNTER — Inpatient Hospital Stay (HOSPITAL_COMMUNITY)
Admission: AD | Admit: 2021-08-15 | Discharge: 2021-08-15 | Disposition: A | Discharge status: Home / Self Care | Payer: Medicaid Other | Attending: Obstetrics & Gynecology | Admitting: Obstetrics & Gynecology

## 2021-08-15 DIAGNOSIS — R5383 Other fatigue: Secondary | ICD-10-CM | POA: Diagnosis not present

## 2021-08-15 DIAGNOSIS — Z3202 Encounter for pregnancy test, result negative: Secondary | ICD-10-CM | POA: Diagnosis not present

## 2021-08-15 DIAGNOSIS — R110 Nausea: Secondary | ICD-10-CM | POA: Insufficient documentation

## 2021-08-15 DIAGNOSIS — R109 Unspecified abdominal pain: Secondary | ICD-10-CM | POA: Insufficient documentation

## 2021-08-15 LAB — POCT PREGNANCY, URINE: Preg Test, Ur: NEGATIVE

## 2021-08-15 NOTE — MAU Note (Signed)
Pt was discharged by MAU provider, Jorje Guild, NP

## 2021-08-15 NOTE — MAU Provider Note (Signed)
Event Date/Time   First Provider Initiated Contact with Patient 08/15/21 1948      S [name] is a 36 y.o. [id number] patient who presents to MAU today with complaint of abdominal pain, chills, diarrhea, & fatigue. Symptoms started 4 days ago. Has been taking tylenol, ibuprofen, & cold medicine without improvement. Was unsure if she was pregnant. Had BTL last year.   O BP 104/61 (BP Location: Right Arm)    Pulse 74    Temp 98.3 F (36.8 C) (Oral)    Resp 18    Ht 5\' 4"  (1.626 m)    Wt 69.6 kg    SpO2 100%    BMI 26.35 kg/m  Physical Exam Vitals and nursing note reviewed.  Constitutional:      General: She is not in acute distress.    Appearance: She is well-developed. She is not ill-appearing or toxic-appearing.  HENT:     Head: Normocephalic and atraumatic.  Eyes:     General: No scleral icterus.    Pupils: Pupils are equal, round, and reactive to light.  Pulmonary:     Effort: Pulmonary effort is normal. No respiratory distress.  Neurological:     General: No focal deficit present.     Mental Status: She is alert.  Psychiatric:        Mood and Affect: Mood normal.        Behavior: Behavior normal.    A Medical screening exam complete 1. Negative pregnancy test      P Discharge from MAU in stable condition Patient given the option of transfer to [facility] for further evaluation or seek care in outpatient facility of choice - patient opts to go to urgent care   Jorje Guild, NP 08/15/2021 7:58 PM

## 2021-08-15 NOTE — MAU Note (Signed)
Pt presented to MAU for diarrhea, nausea, ABD pain, fatigue, chills, and body aches for 4 days. Pt POCT urine pregnancy test is negative. Pt has tylenol, Motrin, and Cold/flu medication, but has given her no relief. Pt reports she has been around sick people but unsure what illness. Pt has not had fever but has not taken her temp, just felt hot and cold. MAU provider will come and MSE pt.

## 2021-08-16 ENCOUNTER — Encounter (HOSPITAL_COMMUNITY): Payer: Self-pay

## 2021-08-16 ENCOUNTER — Ambulatory Visit (HOSPITAL_COMMUNITY)
Admission: EM | Admit: 2021-08-16 | Discharge: 2021-08-16 | Disposition: A | Discharge status: Home / Self Care | Payer: Medicaid Other | Attending: Student | Admitting: Student

## 2021-08-16 DIAGNOSIS — A084 Viral intestinal infection, unspecified: Secondary | ICD-10-CM | POA: Diagnosis not present

## 2021-08-16 DIAGNOSIS — Z0289 Encounter for other administrative examinations: Secondary | ICD-10-CM

## 2021-08-16 DIAGNOSIS — Z9851 Tubal ligation status: Secondary | ICD-10-CM

## 2021-08-16 MED ORDER — ONDANSETRON 8 MG PO TBDP: 8.0000 mg | ORAL_TABLET | Freq: Three times a day (TID) | ORAL | 0 refills | Status: DC | PRN

## 2021-08-16 MED ORDER — LOPERAMIDE HCL 2 MG PO CAPS: 2.0000 mg | ORAL_CAPSULE | Freq: Four times a day (QID) | ORAL | 0 refills | Status: DC | PRN

## 2021-08-16 NOTE — ED Triage Notes (Signed)
Pt presents with c/o diarrhea and stomach pain x 5 days.

## 2021-08-16 NOTE — ED Provider Notes (Signed)
[facility]    CSN: [id number] Arrival date & time: 08/16/21  1937      History   Chief Complaint Chief Complaint  Patient presents with   Diarrhea   Abdominal Pain    HPI Yitzel Shasteen is a 36 y.o. female presenting with diarrhea and abd pain x5 days. Requires work note. Medical hisotry chlamydia, gonorrhea, marijuana use. Nausea without vomiting Generalized crampy abd pain Diarrhea every time she eats - 3x daily  Tolerating fluids and foods  History tubal ligation  Has not tried any medications for the symptoms.  Denies sick contacts. Does endorse recent eating out  Here for a work note   HPI  Past Medical History:  Diagnosis Date   Anemia    first and sec pregnancies   Arrhythmia    Asymptomatic bacteriuria during pregnancy in first trimester 07/09/2019   E.coli and enteroccus faecalis in urine at NOB; RX for keflex sent.    Chlamydia    Gestational hypertension 12/17/2017   intrapartum   Gonorrhea    Headache(784.0)    Hemorrhoids    Hx of migraine headaches    Leiomyoma of uterus in pregnancy    Marijuana use 06/16/2017   Positive screen on initial OB    Patient Active Problem List   Diagnosis Date Noted   Status post bilateral salpingectomy 02/15/2021   Grand multiparity 02/14/2021   Fibroids 06/18/2019    Past Surgical History:  Procedure Laterality Date   INDUCED ABORTION  March  2 ,2013   TUBAL LIGATION Bilateral 02/15/2021   Procedure: POST PARTUM TUBAL LIGATION;  Surgeon: Caren Macadam, MD;  Location: [facility] LD ORS;  Service: Gynecology;  Laterality: Bilateral;   WISDOM TOOTH EXTRACTION      OB History     Gravida  10   Para  7   Term  7   Preterm  0   AB  3   Living  7      SAB  1   IAB  2   Ectopic  0   Multiple  0   Live Births  7            Home Medications    Prior to Admission medications   Medication Sig Start Date End Date Taking? Authorizing Provider  loperamide (IMODIUM) 2 MG capsule  Take 1 capsule (2 mg total) by mouth 4 (four) times daily as needed for diarrhea or loose stools. 08/16/21  Yes Hazel Sams, PA-C  ondansetron (ZOFRAN-ODT) 8 MG disintegrating tablet Take 1 tablet (8 mg total) by mouth every 8 (eight) hours as needed for nausea or vomiting. 08/16/21  Yes Hazel Sams, PA-C  ibuprofen (ADVIL) 600 MG tablet Take 1 tablet (600 mg total) by mouth every 6 (six) hours. 02/16/21   Cresenzo-Dishmon, Joaquim Lai, CNM  NIFEdipine (ADALAT CC) 30 MG 24 hr tablet Take 1 tablet (30 mg total) by mouth daily. 02/16/21   Cresenzo-Dishmon, Joaquim Lai, CNM  oxyCODONE (ROXICODONE) 5 MG immediate release tablet Take 1 tablet (5 mg total) by mouth every 4 (four) hours as needed for severe pain. 02/16/21   Maness, Arnette Norris, MD    Family History Family History  Problem Relation Age of Onset   Healthy Mother    Healthy Father    Other Neg Hx    Alcohol abuse Neg Hx    Arthritis Neg Hx    Asthma Neg Hx    Birth defects Neg Hx    Cancer Neg Hx  COPD Neg Hx    Depression Neg Hx    Diabetes Neg Hx    Drug abuse Neg Hx    Early death Neg Hx    Heart disease Neg Hx    Hearing loss Neg Hx    Hyperlipidemia Neg Hx    Hypertension Neg Hx    Kidney disease Neg Hx    Learning disabilities Neg Hx    Mental illness Neg Hx    Mental retardation Neg Hx    Miscarriages / Stillbirths Neg Hx    Stroke Neg Hx    Vision loss Neg Hx     Social History Social History   Tobacco Use   Smoking status: Never   Smokeless tobacco: Never  Vaping Use   Vaping Use: Never used  Substance Use Topics   Alcohol use: Not Currently    Comment: Pt reports socially   Drug use: Not Currently    Frequency: 14.0 times per week    Types: Marijuana    Comment: Reports not currently smoking     Allergies   Latex   Review of Systems Review of Systems  Constitutional:  Negative for appetite change, chills and fever.  HENT:  Negative for congestion, ear pain, rhinorrhea, sinus pressure, sinus pain  and sore throat.   Eyes:  Negative for redness and visual disturbance.  Respiratory:  Negative for cough, chest tightness, shortness of breath and wheezing.   Cardiovascular:  Negative for chest pain and palpitations.  Gastrointestinal:  Positive for abdominal pain, diarrhea and nausea. Negative for constipation and vomiting.  Genitourinary:  Negative for dysuria, frequency and urgency.  Musculoskeletal:  Negative for myalgias.  Neurological:  Negative for dizziness, weakness and headaches.  Psychiatric/Behavioral:  Negative for confusion.   All other systems reviewed and are negative.   Physical Exam Triage Vital Signs ED Triage Vitals  Enc Vitals Group     BP 08/16/21 1955 100/70     Pulse Rate 08/16/21 1955 79     Resp 08/16/21 1955 17     Temp --      Temp src --      SpO2 08/16/21 1955 98 %     Weight --      Height --      Head Circumference --      Peak Flow --      Pain Score 08/16/21 1954 2     Pain Loc --      Pain Edu? --      Excl. in [place]? --    No data found.  Updated Vital Signs BP 100/70 (BP Location: Right Arm)    Pulse 79    Resp 17    LMP 08/01/2021 (Exact Date)    SpO2 98%   Visual Acuity Right Eye Distance:   Left Eye Distance:   Bilateral Distance:    Right Eye Near:   Left Eye Near:    Bilateral Near:     Physical Exam Vitals reviewed.  Constitutional:      General: She is not in acute distress.    Appearance: Normal appearance. She is not ill-appearing.  HENT:     Head: Normocephalic and atraumatic.     Mouth/Throat:     Mouth: Mucous membranes are moist.     Comments: Moist mucous membranes Eyes:     Extraocular Movements: Extraocular movements intact.     Pupils: Pupils are equal, round, and reactive to light.  Cardiovascular:     Rate and  Rhythm: Normal rate and regular rhythm.     Heart sounds: Normal heart sounds.  Pulmonary:     Effort: Pulmonary effort is normal.     Breath sounds: Normal breath sounds. No wheezing, rhonchi  or rales.  Abdominal:     General: Bowel sounds are increased. There is no distension.     Palpations: Abdomen is soft. There is no mass.     Tenderness: There is generalized abdominal tenderness. There is no right CVA tenderness, left CVA tenderness, guarding or rebound. Negative signs include Murphy's sign, Rovsing's sign and McBurney's sign.     Comments: Generalized TTP without focal component. Comfortable throughout exam.   Skin:    General: Skin is warm.     Capillary Refill: Capillary refill takes 2 to 3 seconds.     Comments: Good skin turgor  Neurological:     General: No focal deficit present.     Mental Status: She is alert and oriented to person, place, and time.  Psychiatric:        Mood and Affect: Mood normal.        Behavior: Behavior normal.     UC Treatments / Results  Labs (all labs ordered are listed, but only abnormal results are displayed) Labs Reviewed - No data to display  EKG   Radiology No results found.  Procedures Procedures (including critical care time)  Medications Ordered in UC Medications - No data to display  Initial Impression / Assessment and Plan / UC Course  I have reviewed the triage vital signs and the nursing notes.  Pertinent labs & imaging results that were available during my care of the patient were reviewed by me and considered in my medical decision making (see chart for details).     This patient is a very pleasant 36 y.o. year old female presenting with viral gastroenteritis. Afebrile, nontachy. History tubal ligation. Appears fairly well hydrated.   Zofran ODT and imodium sent. Good hydration, BRAT diet.   Work note provided. ED return precautions discussed. Patient verbalizes understanding and agreement.    Final Clinical Impressions(s) / UC Diagnoses   Final diagnoses:  Viral gastroenteritis  History of tubal ligation  Encounter to obtain excuse from work     Discharge Instructions      -Take the Zofran  (ondansetron) up to 3 times daily for nausea and vomiting. Dissolve one pill under your tongue or between your teeth and your cheek. -Take the Imodium (loperamide) up to 4 times daily for diarrhea. -Drink plenty of fluids and eat a bland diet      ED Prescriptions     Medication Sig Dispense Auth. Provider   ondansetron (ZOFRAN-ODT) 8 MG disintegrating tablet Take 1 tablet (8 mg total) by mouth every 8 (eight) hours as needed for nausea or vomiting. 20 tablet Hazel Sams, PA-C   loperamide (IMODIUM) 2 MG capsule Take 1 capsule (2 mg total) by mouth 4 (four) times daily as needed for diarrhea or loose stools. 12 capsule Hazel Sams, PA-C      PDMP not reviewed this encounter.   Hazel Sams, PA-C 08/16/21 2015

## 2021-08-16 NOTE — Discharge Instructions (Addendum)
-  Take the Zofran (ondansetron) up to 3 times daily for nausea and vomiting. Dissolve one pill under your tongue or between your teeth and your cheek. -Take the Imodium (loperamide) up to 4 times daily for diarrhea. -Drink plenty of fluids and eat a bland diet

## 2021-08-27 ENCOUNTER — Ambulatory Visit (HOSPITAL_COMMUNITY)
Admission: EM | Admit: 2021-08-27 | Discharge: 2021-08-27 | Disposition: A | Discharge status: Home / Self Care | Payer: Medicaid Other | Attending: Internal Medicine | Admitting: Internal Medicine

## 2021-08-27 ENCOUNTER — Other Ambulatory Visit: Payer: Self-pay

## 2021-08-27 ENCOUNTER — Encounter (HOSPITAL_COMMUNITY): Payer: Self-pay | Admitting: Emergency Medicine

## 2021-08-27 ENCOUNTER — Ambulatory Visit (INDEPENDENT_AMBULATORY_CARE_PROVIDER_SITE_OTHER): Payer: Medicaid Other

## 2021-08-27 DIAGNOSIS — M25571 Pain in right ankle and joints of right foot: Secondary | ICD-10-CM

## 2021-08-27 MED ORDER — IBUPROFEN 600 MG PO TABS: 600.0000 mg | ORAL_TABLET | Freq: Four times a day (QID) | ORAL | 0 refills | Status: DC

## 2021-08-27 NOTE — Discharge Instructions (Signed)
Apply a compressive ACE bandage. Rest and elevate the affected painful area.  Apply cold compresses intermittently as needed.  As pain recedes, begin normal activities slowly as tolerated.  Call if symptoms persist.

## 2021-08-27 NOTE — ED Provider Notes (Signed)
[place]    CSN: [id number] Arrival date & time: 08/27/21  1450      History   Chief Complaint Chief Complaint  Patient presents with   Ankle Pain    HPI Sonia Allen is a 36 y.o. female comes to the urgent care with right ankle pain of 3 days duration.  Patient tripped over an object and fell.  Following that she started experiencing sharp right ankle pain of moderate severity.  Patient applied topical analgesic with improvement in pain.  Pain is aggravated by walking and bearing weight.  No radiation of pain.  No bruising of the ankle or foot.  She has had slight swelling of the right ankle.  Over the past 24 to 36 hours patient endorses that the pain has improved and she is able to ambulate better.   HPI  Past Medical History:  Diagnosis Date   Anemia    first and sec pregnancies   Arrhythmia    Asymptomatic bacteriuria during pregnancy in first trimester 07/09/2019   E.coli and enteroccus faecalis in urine at NOB; RX for keflex sent.    Chlamydia    Gestational hypertension 12/17/2017   intrapartum   Gonorrhea    Headache(784.0)    Hemorrhoids    Hx of migraine headaches    Leiomyoma of uterus in pregnancy    Marijuana use 06/16/2017   Positive screen on initial OB    Patient Active Problem List   Diagnosis Date Noted   Status post bilateral salpingectomy 02/15/2021   Grand multiparity 02/14/2021   Fibroids 06/18/2019    Past Surgical History:  Procedure Laterality Date   INDUCED ABORTION  March  2 ,2013   TUBAL LIGATION Bilateral 02/15/2021   Procedure: POST PARTUM TUBAL LIGATION;  Surgeon: Caren Macadam, MD;  Location: [facility];  Service: Gynecology;  Laterality: Bilateral;   WISDOM TOOTH EXTRACTION      OB History     Gravida  10   Para  7   Term  7   Preterm  0   AB  3   Living  7      SAB  1   IAB  2   Ectopic  0   Multiple  0   Live Births  7            Home Medications    Prior to Admission  medications   Medication Sig Start Date End Date Taking? Authorizing Provider  ibuprofen (ADVIL) 600 MG tablet Take 1 tablet (600 mg total) by mouth every 6 (six) hours. 08/27/21   Shaquaya Wuellner, Myrene Galas, MD  loperamide (IMODIUM) 2 MG capsule Take 1 capsule (2 mg total) by mouth 4 (four) times daily as needed for diarrhea or loose stools. 08/16/21   Hazel Sams, PA-C  NIFEdipine (ADALAT CC) 30 MG 24 hr tablet Take 1 tablet (30 mg total) by mouth daily. 02/16/21   Cresenzo-Dishmon, Joaquim Lai, CNM  ondansetron (ZOFRAN-ODT) 8 MG disintegrating tablet Take 1 tablet (8 mg total) by mouth every 8 (eight) hours as needed for nausea or vomiting. 08/16/21   Hazel Sams, PA-C  oxyCODONE (ROXICODONE) 5 MG immediate release tablet Take 1 tablet (5 mg total) by mouth every 4 (four) hours as needed for severe pain. 02/16/21   Delora Fuel, MD    Family History Family History  Problem Relation Age of Onset   Healthy Mother    Healthy Father    Other Neg Hx  Alcohol abuse Neg Hx    Arthritis Neg Hx    Asthma Neg Hx    Birth defects Neg Hx    Cancer Neg Hx    COPD Neg Hx    Depression Neg Hx    Diabetes Neg Hx    Drug abuse Neg Hx    Early death Neg Hx    Heart disease Neg Hx    Hearing loss Neg Hx    Hyperlipidemia Neg Hx    Hypertension Neg Hx    Kidney disease Neg Hx    Learning disabilities Neg Hx    Mental illness Neg Hx    Mental retardation Neg Hx    Miscarriages / Stillbirths Neg Hx    Stroke Neg Hx    Vision loss Neg Hx     Social History Social History   Tobacco Use   Smoking status: Never   Smokeless tobacco: Never  Vaping Use   Vaping Use: Never used  Substance Use Topics   Alcohol use: Not Currently    Comment: Pt reports socially   Drug use: Not Currently    Frequency: 14.0 times per week    Types: Marijuana    Comment: Reports not currently smoking     Allergies   Latex   Review of Systems Review of Systems  Musculoskeletal:  Positive for arthralgias and  joint swelling. Negative for gait problem, myalgias and neck pain.    Physical Exam Triage Vital Signs ED Triage Vitals  Enc Vitals Group     BP 08/27/21 1630 125/80     Pulse Rate 08/27/21 1630 69     Resp 08/27/21 1630 18     Temp 08/27/21 1630 98.2 F (36.8 C)     Temp Source 08/27/21 1630 Oral     SpO2 08/27/21 1630 98 %     Weight --      Height --      Head Circumference --      Peak Flow --      Pain Score 08/27/21 1628 0     Pain Loc --      Pain Edu? --      Excl. in [place]? --    No data found.  Updated Vital Signs BP 125/80 (BP Location: Left Arm)    Pulse 69    Temp 98.2 F (36.8 C) (Oral)    Resp 18    LMP 08/01/2021 (Exact Date)    SpO2 98%   Visual Acuity Right Eye Distance:   Left Eye Distance:   Bilateral Distance:    Right Eye Near:   Left Eye Near:    Bilateral Near:     Physical Exam Vitals and nursing note reviewed. Exam conducted with a chaperone present.  Constitutional:      Appearance: Normal appearance.  Cardiovascular:     Rate and Rhythm: Normal rate and regular rhythm.  Pulmonary:     Effort: Pulmonary effort is normal.     Breath sounds: Normal breath sounds.  Musculoskeletal:        General: Swelling and tenderness present. No deformity or signs of injury. Normal range of motion.  Skin:    General: Skin is warm.  Neurological:     Mental Status: She is alert.     UC Treatments / Results  Labs (all labs ordered are listed, but only abnormal results are displayed) Labs Reviewed - No data to display  EKG   Radiology DG Ankle Complete Right  Result  Date: 08/27/2021 CLINICAL DATA:  Status post fall.  Pain and swelling. EXAM: RIGHT ANKLE - COMPLETE 3+ VIEW COMPARISON:  None FINDINGS: There is no evidence of fracture, dislocation, or joint effusion. There is no evidence of arthropathy or other focal bone abnormality. Soft tissues are unremarkable. IMPRESSION: Negative. Electronically Signed   By: Kerby Moors M.D.   On:  08/27/2021 16:56    Procedures Procedures (including critical care time)  Medications Ordered in UC Medications - No data to display  Initial Impression / Assessment and Plan / UC Course  I have reviewed the triage vital signs and the nursing notes.  Pertinent labs & imaging results that were available during my care of the patient were reviewed by me and considered in my medical decision making (see chart for details).     1.  Right ankle sprain: X-ray of the right ankle is negative for fracture Continue NSAIDs use as needed for pain Ace wrap RICE Return to urgent care if symptoms worsen. Final Clinical Impressions(s) / UC Diagnoses   Final diagnoses:  Acute right ankle pain     Discharge Instructions      Apply a compressive ACE bandage. Rest and elevate the affected painful area.  Apply cold compresses intermittently as needed.  As pain recedes, begin normal activities slowly as tolerated.  Call if symptoms persist.    ED Prescriptions     Medication Sig Dispense Auth. Provider   ibuprofen (ADVIL) 600 MG tablet Take 1 tablet (600 mg total) by mouth every 6 (six) hours. 30 tablet Danil Wedge, Myrene Galas, MD      PDMP not reviewed this encounter.   Chase Picket, MD 08/27/21 [phone]

## 2021-08-27 NOTE — ED Triage Notes (Signed)
On Saturday, patient slipped on last step and right foot turned to the side.  Patient has pain, swelling in ankle

## 2021-11-20 ENCOUNTER — Ambulatory Visit (HOSPITAL_COMMUNITY)
Admission: EM | Admit: 2021-11-20 | Discharge: 2021-11-20 | Disposition: A | Discharge status: Home / Self Care | Payer: Medicaid Other | Attending: Internal Medicine | Admitting: Internal Medicine

## 2021-11-20 ENCOUNTER — Encounter (HOSPITAL_COMMUNITY): Payer: Self-pay

## 2021-11-20 DIAGNOSIS — R519 Headache, unspecified: Secondary | ICD-10-CM | POA: Insufficient documentation

## 2021-11-20 DIAGNOSIS — J209 Acute bronchitis, unspecified: Secondary | ICD-10-CM | POA: Diagnosis not present

## 2021-11-20 DIAGNOSIS — R350 Frequency of micturition: Secondary | ICD-10-CM | POA: Diagnosis not present

## 2021-11-20 DIAGNOSIS — R5383 Other fatigue: Secondary | ICD-10-CM | POA: Insufficient documentation

## 2021-11-20 DIAGNOSIS — R509 Fever, unspecified: Secondary | ICD-10-CM | POA: Diagnosis not present

## 2021-11-20 DIAGNOSIS — N3000 Acute cystitis without hematuria: Secondary | ICD-10-CM | POA: Diagnosis not present

## 2021-11-20 DIAGNOSIS — R1030 Lower abdominal pain, unspecified: Secondary | ICD-10-CM | POA: Diagnosis present

## 2021-11-20 DIAGNOSIS — J029 Acute pharyngitis, unspecified: Secondary | ICD-10-CM | POA: Diagnosis not present

## 2021-11-20 DIAGNOSIS — Z20822 Contact with and (suspected) exposure to covid-19: Secondary | ICD-10-CM | POA: Diagnosis not present

## 2021-11-20 DIAGNOSIS — R0602 Shortness of breath: Secondary | ICD-10-CM | POA: Diagnosis not present

## 2021-11-20 DIAGNOSIS — R0981 Nasal congestion: Secondary | ICD-10-CM | POA: Diagnosis not present

## 2021-11-20 LAB — POCT URINALYSIS DIPSTICK, ED / UC
Bilirubin Urine: NEGATIVE
Glucose, UA: NEGATIVE mg/dL
Ketones, ur: NEGATIVE mg/dL
Nitrite: POSITIVE — AB
Protein, ur: NEGATIVE mg/dL
Specific Gravity, Urine: 1.015 (ref 1.005–1.030)
Urobilinogen, UA: 0.2 mg/dL (ref 0.0–1.0)
pH: 5.5 (ref 5.0–8.0)

## 2021-11-20 LAB — POCT RAPID STREP A, ED / UC: Streptococcus, Group A Screen (Direct): NEGATIVE

## 2021-11-20 MED ORDER — PREDNISONE 20 MG PO TABS: 40.0000 mg | ORAL_TABLET | Freq: Every day | ORAL | 0 refills | Status: AC

## 2021-11-20 MED ORDER — ALBUTEROL SULFATE (2.5 MG/3ML) 0.083% IN NEBU
2.5000 mg | INHALATION_SOLUTION | Freq: Once | RESPIRATORY_TRACT | Status: AC
  Administered 2021-11-20: 2.5 mg via RESPIRATORY_TRACT

## 2021-11-20 MED ORDER — METHYLPREDNISOLONE SODIUM SUCC 125 MG IJ SOLR
INTRAMUSCULAR | Status: AC
  Filled 2021-11-20: qty 2

## 2021-11-20 MED ORDER — METHYLPREDNISOLONE SODIUM SUCC 125 MG IJ SOLR
80.0000 mg | Freq: Once | INTRAMUSCULAR | Status: AC
  Administered 2021-11-20: 80 mg via INTRAMUSCULAR

## 2021-11-20 MED ORDER — ALBUTEROL SULFATE HFA 108 (90 BASE) MCG/ACT IN AERS: 1.0000 | INHALATION_SPRAY | Freq: Four times a day (QID) | RESPIRATORY_TRACT | 1 refills | Status: DC | PRN

## 2021-11-20 MED ORDER — IBUPROFEN 800 MG PO TABS
800.0000 mg | ORAL_TABLET | Freq: Once | ORAL | Status: AC
  Administered 2021-11-20: 800 mg via ORAL

## 2021-11-20 MED ORDER — ALBUTEROL SULFATE (2.5 MG/3ML) 0.083% IN NEBU
INHALATION_SOLUTION | RESPIRATORY_TRACT | Status: AC
  Filled 2021-11-20: qty 3

## 2021-11-20 MED ORDER — PROMETHAZINE-DM 6.25-15 MG/5ML PO SYRP: 5.0000 mL | ORAL_SOLUTION | Freq: Four times a day (QID) | ORAL | 0 refills | Status: DC | PRN

## 2021-11-20 MED ORDER — ACETAMINOPHEN 500 MG PO TABS: 500.0000 mg | ORAL_TABLET | Freq: Four times a day (QID) | ORAL | 0 refills | Status: DC | PRN

## 2021-11-20 MED ORDER — IBUPROFEN 600 MG PO TABS
600.0000 mg | ORAL_TABLET | Freq: Four times a day (QID) | ORAL | 1 refills | Status: DC
Start: 2021-11-20 — End: 2021-11-20

## 2021-11-20 MED ORDER — GUAIFENESIN ER 1200 MG PO TB12: 1200.0000 mg | ORAL_TABLET | Freq: Two times a day (BID) | ORAL | 0 refills | Status: DC

## 2021-11-20 MED ORDER — IBUPROFEN 800 MG PO TABS
ORAL_TABLET | ORAL | Status: AC
Start: 2021-11-20 — End: ?
  Filled 2021-11-20: qty 1

## 2021-11-20 MED ORDER — CEPHALEXIN 500 MG PO CAPS: 500.0000 mg | ORAL_CAPSULE | Freq: Three times a day (TID) | ORAL | 0 refills | Status: AC

## 2021-11-20 NOTE — ED Provider Notes (Signed)
[facility]    CSN: [id number] Arrival date & time: 11/20/21  1618      History   Chief Complaint Chief Complaint  Patient presents with   URI   Generalized Body Aches    HPI Sonia Allen is a 36 y.o. female.   Patient presents to urgent care with nasal congestion, sore throat, cough, fever, headache, and fatigue for the last 3 days.  Also reports wheezing and cough is worse at night.  Cough is very productive with white/clear sputum and keeps her up at night. Mucus from nasal congestion is very thick and white/clear.  Patient is febrile in clinic today.  Last dose of ibuprofen/tylenol was last night.  She denies a significant medical history of asthma or any other pulmonary problem, but does report history of bronchitis. She denies known sick contacts.  Denies chest pain,  dizziness, weakness, nausea, vomiting, and diarrhea.  Reports shortness of breath with cough, but denies wheezing. She does report a small amount of urinary frequency and right and left lower abdominal pain for the last couple of weeks. Denies dysuria and urinary urgency. She has not tried taking OTC medications for cough and nasal congestion. No other aggravating or relieving factors for symptoms identified.    URI  Past Medical History:  Diagnosis Date   Anemia    first and sec pregnancies   Arrhythmia    Asymptomatic bacteriuria during pregnancy in first trimester 07/09/2019   E.coli and enteroccus faecalis in urine at NOB; RX for keflex sent.    Chlamydia    Gestational hypertension 12/17/2017   intrapartum   Gonorrhea    Headache(784.0)    Hemorrhoids    Hx of migraine headaches    Leiomyoma of uterus in pregnancy    Marijuana use 06/16/2017   Positive screen on initial OB    Patient Active Problem List   Diagnosis Date Noted   Status post bilateral salpingectomy 02/15/2021   Grand multiparity 02/14/2021   Fibroids 06/18/2019    Past Surgical History:  Procedure Laterality Date    INDUCED ABORTION  March  2 ,2013   TUBAL LIGATION Bilateral 02/15/2021   Procedure: POST PARTUM TUBAL LIGATION;  Surgeon: Caren Macadam, MD;  Location: [facility];  Service: Gynecology;  Laterality: Bilateral;   WISDOM TOOTH EXTRACTION      OB History     Gravida  10   Para  7   Term  7   Preterm  0   AB  3   Living  7      SAB  1   IAB  2   Ectopic  0   Multiple  0   Live Births  7            Home Medications    Prior to Admission medications   Medication Sig Start Date End Date Taking? Authorizing Provider  acetaminophen (TYLENOL) 500 MG tablet Take 1 tablet (500 mg total) by mouth every 6 (six) hours as needed. 11/20/21  Yes Talbot Grumbling, FNP  albuterol (VENTOLIN HFA) 108 (90 Base) MCG/ACT inhaler Inhale 1-2 puffs into the lungs every 6 (six) hours as needed for wheezing or shortness of breath. 11/20/21  Yes Dayleen Beske, Stasia Cavalier, FNP  cephALEXin (KEFLEX) 500 MG capsule Take 1 capsule (500 mg total) by mouth 3 (three) times daily for 5 days. 11/20/21 11/25/21 Yes StanhopeStasia Cavalier, FNP  Guaifenesin 1200 MG TB12 Take 1 tablet (1,200 mg total) by mouth in  the morning and at bedtime. 11/20/21  Yes Talbot Grumbling, FNP  predniSONE (DELTASONE) 20 MG tablet Take 2 tablets (40 mg total) by mouth daily for 5 days. 11/20/21 11/25/21 Yes Talbot Grumbling, FNP  promethazine-dextromethorphan (PROMETHAZINE-DM) 6.25-15 MG/5ML syrup Take 5 mLs by mouth 4 (four) times daily as needed for cough. 11/20/21  Yes Talbot Grumbling, FNP  loperamide (IMODIUM) 2 MG capsule Take 1 capsule (2 mg total) by mouth 4 (four) times daily as needed for diarrhea or loose stools. 08/16/21   Hazel Sams, PA-C  NIFEdipine (ADALAT CC) 30 MG 24 hr tablet Take 1 tablet (30 mg total) by mouth daily. 02/16/21   Cresenzo-Dishmon, Joaquim Lai, CNM  ondansetron (ZOFRAN-ODT) 8 MG disintegrating tablet Take 1 tablet (8 mg total) by mouth every 8 (eight) hours as needed for nausea or  vomiting. 08/16/21   Hazel Sams, PA-C  oxyCODONE (ROXICODONE) 5 MG immediate release tablet Take 1 tablet (5 mg total) by mouth every 4 (four) hours as needed for severe pain. 02/16/21   Maness, Arnette Norris, MD    Family History Family History  Problem Relation Age of Onset   Healthy Mother    Healthy Father    Other Neg Hx    Alcohol abuse Neg Hx    Arthritis Neg Hx    Asthma Neg Hx    Birth defects Neg Hx    Cancer Neg Hx    COPD Neg Hx    Depression Neg Hx    Diabetes Neg Hx    Drug abuse Neg Hx    Early death Neg Hx    Heart disease Neg Hx    Hearing loss Neg Hx    Hyperlipidemia Neg Hx    Hypertension Neg Hx    Kidney disease Neg Hx    Learning disabilities Neg Hx    Mental illness Neg Hx    Mental retardation Neg Hx    Miscarriages / Stillbirths Neg Hx    Stroke Neg Hx    Vision loss Neg Hx     Social History Social History   Tobacco Use   Smoking status: Never   Smokeless tobacco: Never  Vaping Use   Vaping Use: Never used  Substance Use Topics   Alcohol use: Not Currently    Comment: Pt reports socially   Drug use: Not Currently    Frequency: 14.0 times per week    Types: Marijuana    Comment: Reports not currently smoking     Allergies   Latex   Review of Systems Review of Systems Per HPI  Physical Exam Triage Vital Signs ED Triage Vitals  Enc Vitals Group     BP 11/20/21 1713 119/83     Pulse Rate 11/20/21 1713 89     Resp 11/20/21 1713 20     Temp 11/20/21 1713 (!) 100.9 F (38.3 C)     Temp Source 11/20/21 1713 Oral     SpO2 11/20/21 1713 96 %     Weight --      Height --      Head Circumference --      Peak Flow --      Pain Score 11/20/21 1712 6     Pain Loc --      Pain Edu? --      Excl. in [place]? --    No data found.  Updated Vital Signs BP 119/83 (BP Location: Right Arm)   Pulse 89   Temp (!) 100.9 F (  38.3 C) (Oral)   Resp 20   LMP 11/15/2021   SpO2 96%   Visual Acuity Right Eye Distance:   Left Eye Distance:    Bilateral Distance:    Right Eye Near:   Left Eye Near:    Bilateral Near:     Physical Exam Vitals and nursing note reviewed.  Constitutional:      General: She is not in acute distress.    Appearance: Normal appearance. She is well-developed and normal weight. She is ill-appearing. She is not diaphoretic.  HENT:     Head: Normocephalic and atraumatic.     Right Ear: Tympanic membrane, ear canal and external ear normal. There is no impacted cerumen.     Left Ear: Tympanic membrane, ear canal and external ear normal. There is no impacted cerumen.     Nose: Congestion and rhinorrhea present. Rhinorrhea is clear and purulent.     Mouth/Throat:     Lips: Pink.     Mouth: Mucous membranes are moist.     Pharynx: Uvula midline. Posterior oropharyngeal erythema present. No pharyngeal swelling.     Tonsils: No tonsillar exudate or tonsillar abscesses.  Eyes:     General: Lids are normal. Vision grossly intact. Gaze aligned appropriately. No allergic shiner or visual field deficit.    Extraocular Movements: Extraocular movements intact.     Conjunctiva/sclera: Conjunctivae normal.     Right eye: Right conjunctiva is not injected.     Left eye: Left conjunctiva is not injected.  Cardiovascular:     Rate and Rhythm: Normal rate and regular rhythm.     Heart sounds: Normal heart sounds. No murmur heard.   No friction rub. No gallop.  Pulmonary:     Effort: Pulmonary effort is normal. No respiratory distress.     Breath sounds: Examination of the right-upper field reveals wheezing and rhonchi. Examination of the left-upper field reveals wheezing and rhonchi. Examination of the right-middle field reveals wheezing and rhonchi. Examination of the left-middle field reveals wheezing and rhonchi. Examination of the right-lower field reveals wheezing and rhonchi. Examination of the left-lower field reveals wheezing and rhonchi. Wheezing and rhonchi present. No decreased breath sounds or rales.      Comments: Wheezes and rhonchi to bilateral lung fields throughout prior to albuterol nebulizer treatment.   Chest:     Chest wall: No tenderness.  Abdominal:     General: Abdomen is flat. Bowel sounds are normal.     Palpations: Abdomen is soft.     Tenderness: There is abdominal tenderness in the right lower quadrant, suprapubic area and left lower quadrant. There is no right CVA tenderness, left CVA tenderness or guarding.  Musculoskeletal:        General: No swelling.     Cervical back: Normal range of motion and neck supple.  Lymphadenopathy:     Cervical: Cervical adenopathy present.  Skin:    General: Skin is warm and dry.     Capillary Refill: Capillary refill takes less than 2 seconds.     Findings: No rash.  Neurological:     General: No focal deficit present.     Mental Status: She is alert and oriented to person, place, and time. Mental status is at baseline.     Cranial Nerves: Cranial nerves 2-12 are intact.     Sensory: Sensation is intact.     Motor: Motor function is intact.     Coordination: Coordination is intact.     Gait: Gait is  intact.  Psychiatric:        Mood and Affect: Mood normal.        Behavior: Behavior normal.        Thought Content: Thought content normal.        Judgment: Judgment normal.     UC Treatments / Results  Labs (all labs ordered are listed, but only abnormal results are displayed)   EKG   Radiology No results found.  Procedures Procedures (including critical care time)  Medications Ordered in UC Medications  ibuprofen (ADVIL) tablet 800 mg (800 mg Oral Given 11/20/21 1727)  albuterol (PROVENTIL) (2.5 MG/3ML) 0.083% nebulizer solution 2.5 mg (2.5 mg Nebulization Given 11/20/21 1727)  methylPREDNISolone sodium succinate (SOLU-MEDROL) 125 mg/2 mL injection 80 mg (80 mg Intramuscular Given 11/20/21 1826)    Initial Impression / Assessment and Plan / UC Course  I have reviewed the triage vital signs and the nursing  notes.  Pertinent labs & imaging results that were available during my care of the patient were reviewed by me and considered in my medical decision making (see chart for details).  Patient presents to urgent care with upper respiratory symptoms and fever for the last 3 days.  Upon initial assessment, wheezing and rhonchi heard to bilateral lung fields throughout.  Patient given an albuterol nebulizer treatment in clinic and lung sounds cleared up significantly.  No adventitious lung sounds to auscultation after breathing treatment.  Patient given ibuprofen 800 mg for her sore throat and fever with significant improvement as well.  Plan to treat patient for bronchitis with 80 mg Solu-Medrol steroid in clinic and 40 mg prednisone burst starting tomorrow with food.  Deferred imaging based on stable cardiopulmonary exam post albuterol nebulizer treatment.  Doubt acute pneumonia or any other intrathoracic lung abnormality at this time.  Plan to treat nasal congestion and cough with 1200 mg guaifenesin to be taken every 12 hours.  Patient to drink plenty of water while taking this medication.  Promethazine DM prescribed for cough to be taken at night.  Advised patient not to drive or drink alcohol while taking this medication as it can make her sleepy.  She may take Tylenol and ibuprofen every 6 hours alternating as needed for fever, pain, and inflammation. Albuterol inhaler may be used at home as needed every 4-6 hours for wheezing and shortness of breath related to bronchitis. Group A strep testing negative in clinic, throat culture pending. COVID-19 testing pending. Doubt influenza at this time.   Urinalysis completed due to urinary frequency and abdominal pain complaint shows positive nitrites, moderate leukocytes, and moderate hemoglobin. No CVA tenderness bilaterally. Plan to treat uncomplicated acute cystitis with cephalexin 500 mg three times daily for the next 5 days. Urine culture pending.  Patient  denies allergies to antibiotics.  Counseled patient regarding appropriate use of medications and potential side effects for all medications recommended or prescribed today. Discussed red flag signs and symptoms of worsening condition,when to call the PCP office, return to urgent care, and when to seek higher level of care. Patient verbalizes understanding and agreement with plan. All questions answered. Patient discharged in stable condition.  Final Clinical Impressions(s) / UC Diagnoses   Final diagnoses:  Acute bronchitis, unspecified organism  Urinary frequency  Lower abdominal pain  Acute cystitis without hematuria     Discharge Instructions      You are seen urgent care today for bronchitis.  I gave you ibuprofen 800 mg while in the clinic today for your fever  and sore throat.  You may take Tylenol when you get home if you continue to have throat pain and fever.  I gave you a steroid injection while you are in the clinic today for your bronchitis.  Please start taking the oral steroid that I have prescribed called prednisone 40 mg once daily in the morning with breakfast tomorrow and for the next 5 days.  Avoid taking NSAID medications while taking the steroid.  Take guaifenesin twice daily for nasal congestion and cough.  Drink plenty of water while taking this medication.  Use 2 puffs of the albuterol inhaler every 6 hours as needed for wheezing.  You may take Tylenol as needed for fever and throat pain.  Your COVID-19 test results are pending.  You will receive a phone call if your test is positive, but you will not receive a phone call if your test is negative.  You urinary tract infection.  I would like for you to take Keflex  If you develop any new or worsening symptoms or do not improve in the next 2 to 3 days, please return.  If your symptoms are severe, please go to the emergency room.  Follow-up with your primary care provider for further evaluation and management of your  symptoms as well as ongoing wellness visits.  I hope you feel better!      ED Prescriptions     Medication Sig Dispense Auth. Provider   albuterol (VENTOLIN HFA) 108 (90 Base) MCG/ACT inhaler Inhale 1-2 puffs into the lungs every 6 (six) hours as needed for wheezing or shortness of breath. 18 g Joella Prince M, FNP   acetaminophen (TYLENOL) 500 MG tablet Take 1 tablet (500 mg total) by mouth every 6 (six) hours as needed. 30 tablet Joella Prince M, FNP   ibuprofen (ADVIL) 600 MG tablet  (Status: Discontinued) Take 1 tablet (600 mg total) by mouth every 6 (six) hours. 30 tablet Joella Prince M, FNP   Guaifenesin 1200 MG TB12 Take 1 tablet (1,200 mg total) by mouth in the morning and at bedtime. 14 tablet Joella Prince M, FNP   predniSONE (DELTASONE) 20 MG tablet Take 2 tablets (40 mg total) by mouth daily for 5 days. 10 tablet Talbot Grumbling, FNP   promethazine-dextromethorphan (PROMETHAZINE-DM) 6.25-15 MG/5ML syrup Take 5 mLs by mouth 4 (four) times daily as needed for cough. 118 mL Joella Prince M, FNP   cephALEXin (KEFLEX) 500 MG capsule Take 1 capsule (500 mg total) by mouth 3 (three) times daily for 5 days. 15 capsule Talbot Grumbling, FNP      PDMP not reviewed this encounter.   Joella Prince [place], [place] 11/23/21 [phone]

## 2021-11-20 NOTE — Discharge Instructions (Addendum)
You are seen urgent care today for bronchitis.  I gave you ibuprofen 800 mg while in the clinic today for your fever and sore throat.  You may take Tylenol when you get home if you continue to have throat pain and fever.  I gave you a steroid injection while you are in the clinic today for your bronchitis.  Please start taking the oral steroid that I have prescribed called prednisone 40 mg once daily in the morning with breakfast tomorrow and for the next 5 days.  Avoid taking NSAID medications while taking the steroid.  Take guaifenesin twice daily for nasal congestion and cough.  Drink plenty of water while taking this medication.  Use 2 puffs of the albuterol inhaler every 6 hours as needed for wheezing.  You may take Tylenol as needed for fever and throat pain.  Your COVID-19 test results are pending.  You will receive a phone call if your test is positive, but you will not receive a phone call if your test is negative.  You urinary tract infection.  I would like for you to take Keflex  If you develop any new or worsening symptoms or do not improve in the next 2 to 3 days, please return.  If your symptoms are severe, please go to the emergency room.  Follow-up with your primary care provider for further evaluation and management of your symptoms as well as ongoing wellness visits.  I hope you feel better!

## 2021-11-20 NOTE — ED Notes (Signed)
PO fluids provided for pt to obtain urine sample. 

## 2021-11-20 NOTE — ED Triage Notes (Signed)
Pt presents with generalized body aches, non productive cough, congestion, fatigue, shortness of breath, and sore throat X 3 days.

## 2021-11-21 LAB — SARS CORONAVIRUS 2 (TAT 6-24 HRS): SARS Coronavirus 2: NEGATIVE

## 2021-11-22 LAB — URINE CULTURE: Culture: >=100000 — AB

## 2021-11-23 LAB — CULTURE, GROUP A STREP (THRC)

## 2022-05-08 IMAGING — DX DG ANKLE COMPLETE 3+V*R*
3 series · 3 of 3 positions shown · non-contrast
Comparison: None

CLINICAL DATA: Status post fall.  Pain and swelling.

EXAM:
RIGHT ANKLE - COMPLETE 3+ VIEW

[ankle ap]
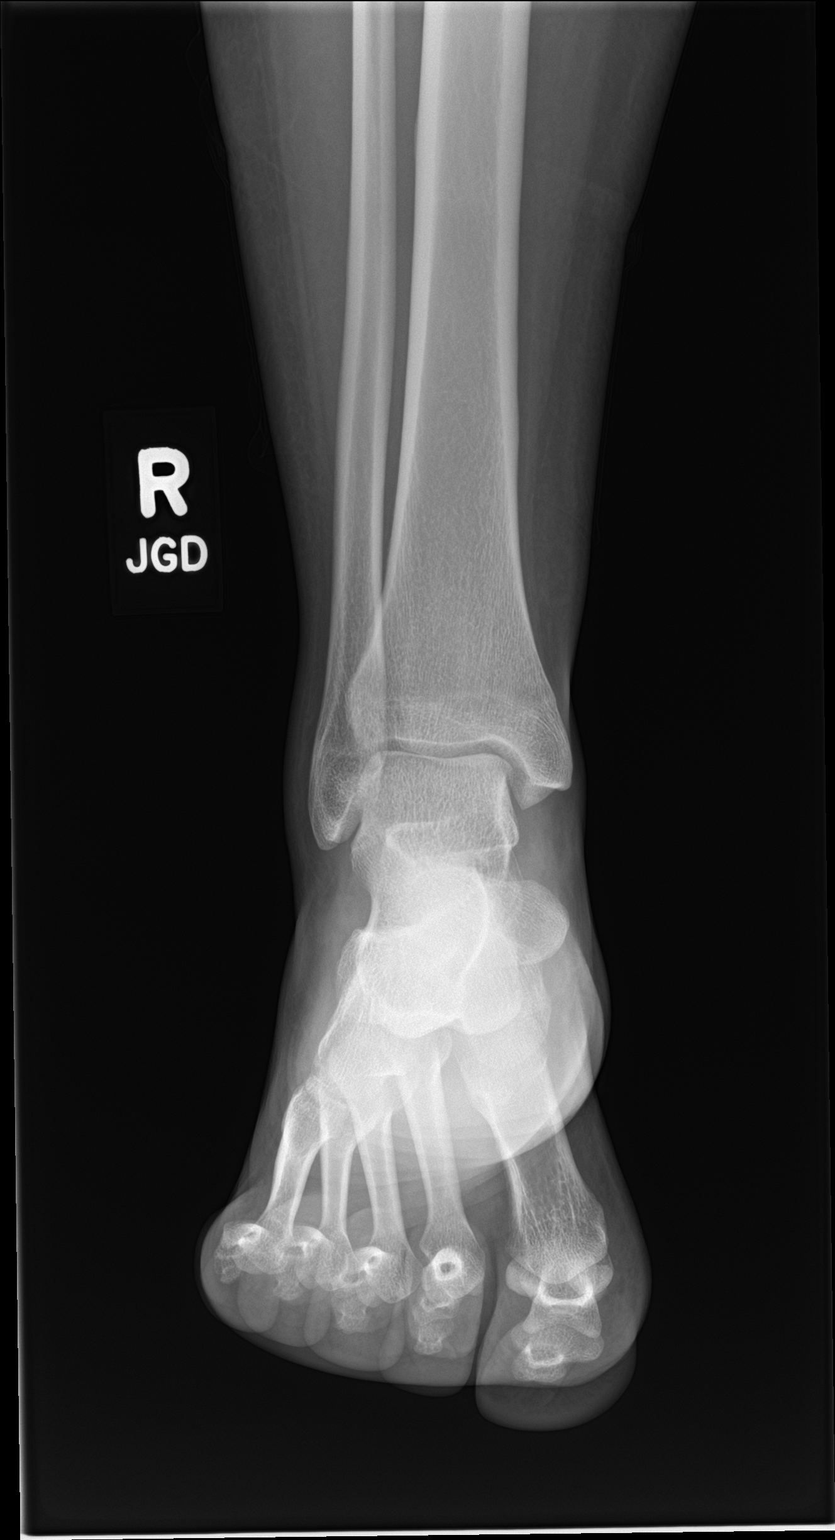

[ankle obl]
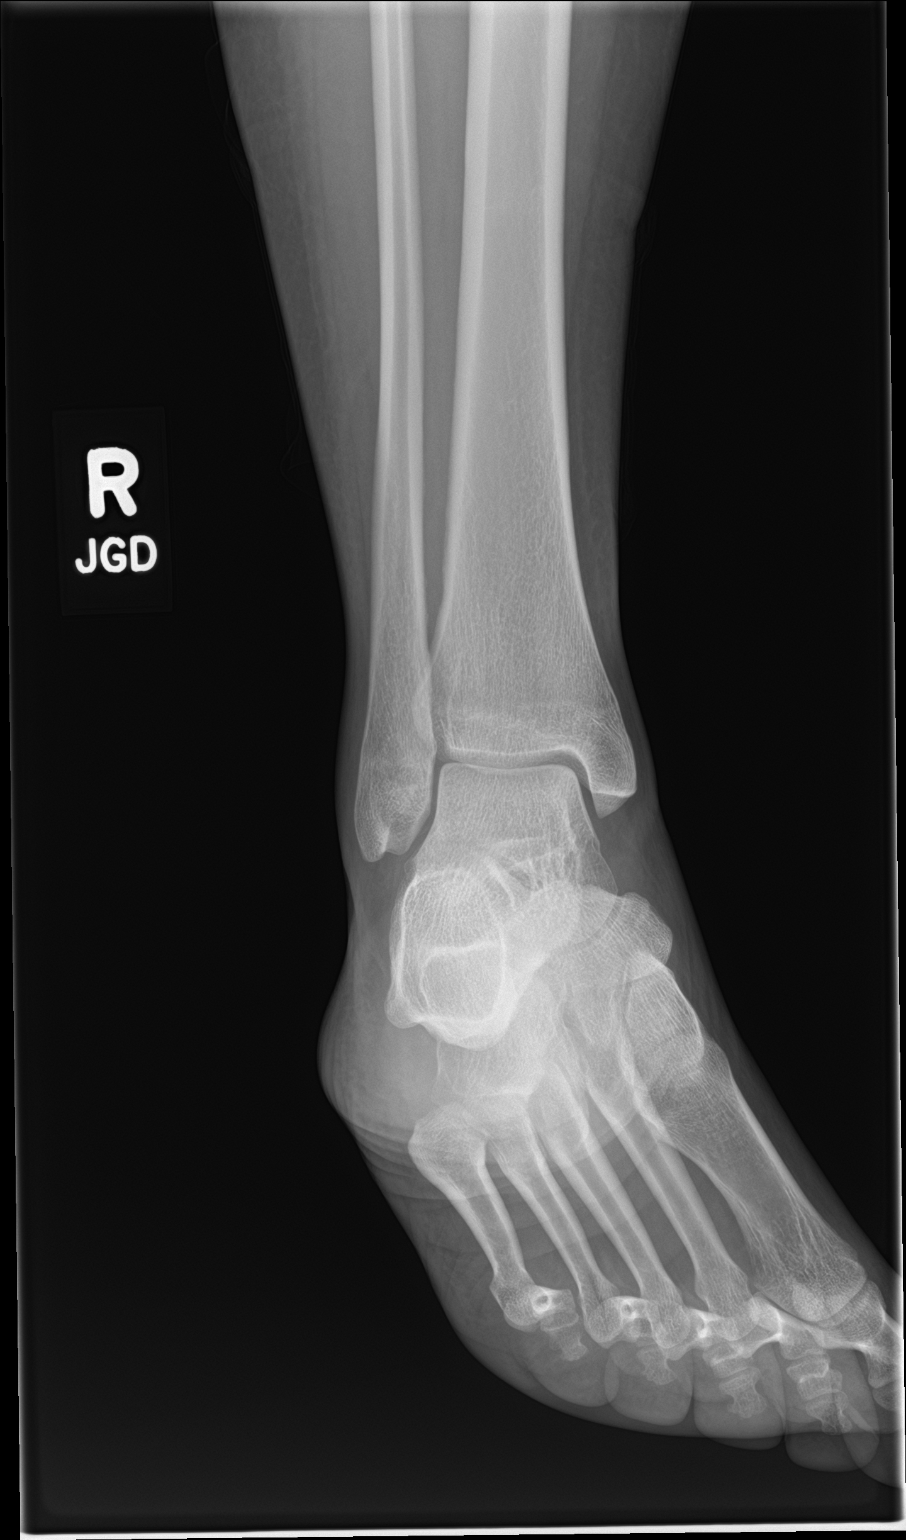

[ankle lat]
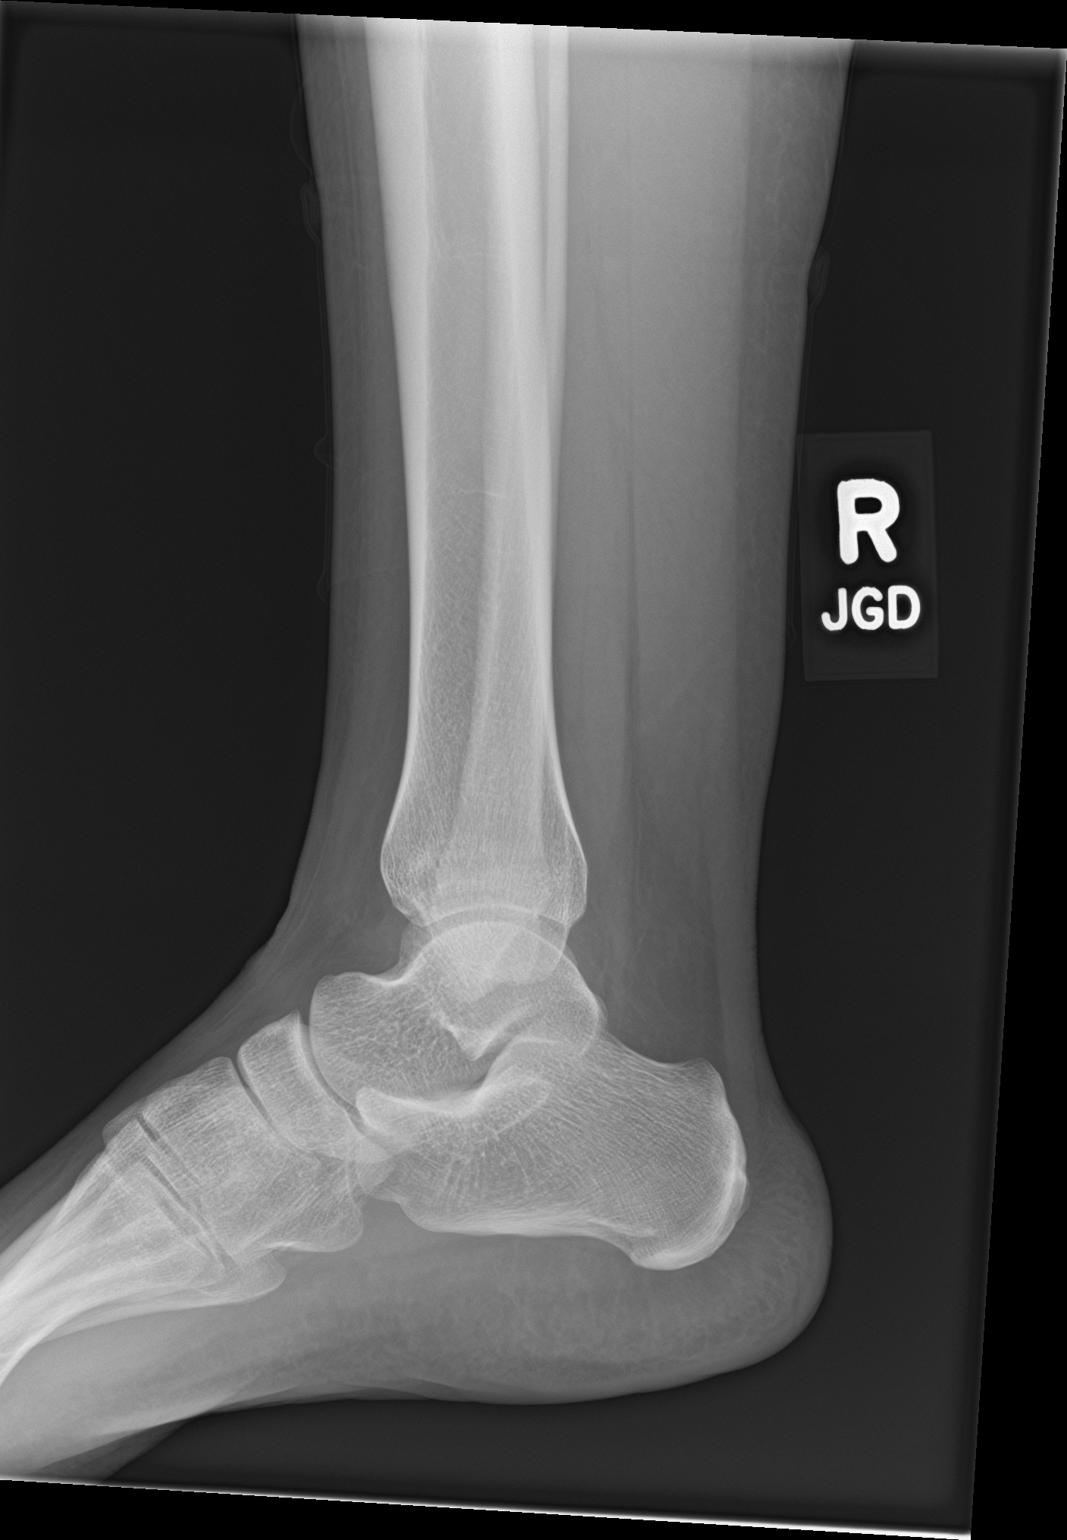

[3 of 3 positions shown; findings below may reference images not displayed]

FINDINGS: There is no evidence of fracture, dislocation, or joint effusion.
There is no evidence of arthropathy or other focal bone abnormality.
Soft tissues are unremarkable.
IMPRESSION: Negative.

## 2022-07-10 ENCOUNTER — Telehealth: Payer: Self-pay | Admitting: Family Medicine

## 2022-07-10 ENCOUNTER — Inpatient Hospital Stay (HOSPITAL_COMMUNITY)
Admission: AD | Admit: 2022-07-10 | Discharge: 2022-07-10 | Disposition: A | Discharge status: Home / Self Care | Payer: Medicaid Other | Attending: Obstetrics & Gynecology | Admitting: Obstetrics & Gynecology

## 2022-07-10 DIAGNOSIS — N926 Irregular menstruation, unspecified: Secondary | ICD-10-CM

## 2022-07-10 DIAGNOSIS — R109 Unspecified abdominal pain: Secondary | ICD-10-CM | POA: Diagnosis not present

## 2022-07-10 DIAGNOSIS — R110 Nausea: Secondary | ICD-10-CM | POA: Insufficient documentation

## 2022-07-10 DIAGNOSIS — Z3202 Encounter for pregnancy test, result negative: Secondary | ICD-10-CM | POA: Diagnosis not present

## 2022-07-10 DIAGNOSIS — R112 Nausea with vomiting, unspecified: Secondary | ICD-10-CM | POA: Diagnosis not present

## 2022-07-10 DIAGNOSIS — R42 Dizziness and giddiness: Secondary | ICD-10-CM | POA: Diagnosis not present

## 2022-07-10 DIAGNOSIS — Z9851 Tubal ligation status: Secondary | ICD-10-CM

## 2022-07-10 LAB — POCT PREGNANCY, URINE: Preg Test, Ur: NEGATIVE

## 2022-07-10 NOTE — MAU Note (Signed)
..  Sonia Allen is a 37 y.o. at Unknown here in MAU reporting: For 4 day she has felt weak, fatigued, dizzy, abdominal cramping, breast tenderness, neck and lower back pain and nausea.  Was supposed to start her period 4 days ago and has not. Has not had a positive at home pregnancy test. Has tubal ligation last year.  LMP: 06/10/2022 Onset of complaint: 4 days ago, sudden onset  Pain score: 4/10 Vitals:   07/10/22 1959  BP: 123/73  Pulse: 87  Resp: (!) 24  Temp: 99.3 F (37.4 C)  SpO2: 100%      Lab orders placed from triage:  POCT preg

## 2022-07-10 NOTE — Telephone Encounter (Signed)
Got tubes tied last year but has been feeling dizzy, feeling weak and some cramping, has not had her period yet for the month

## 2022-07-10 NOTE — MAU Provider Note (Addendum)
Event Date/Time   First Provider Initiated Contact with Patient 07/10/22 2016      S Sonia Allen is a 37 y.o. [id number] patient who presents to MAU today with complaint of being 3 days late for her period.  Has been having weakness, dizziness, abdominal cramping, nausea and low back pain since Monday   Wants to make sure she is not pregnant. Had a postpartum tubal ligation in 2022 after her last delivery. .   RN Note: Sonia Allen is a 37 y.o. here in MAU reporting: For 4 day she has felt weak, fatigued, dizzy, abdominal cramping, breast tenderness, neck and lower back pain and nausea.  Was supposed to start her period 4 days ago and has not. Has not had a positive at home pregnancy test. Has tubal ligation last year.  LMP: 06/10/2022 Onset of complaint: 4 days ago, sudden onset  Pain score: 4/10  O BP 123/73 (BP Location: Right Arm)   Pulse 87   Temp 99.3 F (37.4 C) (Oral)   Resp (!) 24   Ht '5\' 4"'$  (1.626 m)   Wt 70.4 kg   SpO2 100%   BMI 26.62 kg/m  RN note reviewed VS reviewed Alert oriented, non toxic, no apparent distress.  Normal gait Drinking without vomiting No diaphoresis or obvious distress noted  HR normal  Respirations unlabored MAEW Skin warm and dry Results for orders placed or performed during the hospital encounter of 07/10/22 (from the past 24 hour(s))  Pregnancy, urine POC     Status: None   Collection Time: 07/10/22  7:52 PM  Result Value Ref Range   Preg Test, Ur NEGATIVE NEGATIVE     A Medical screening exam complete 3 days late for cycle Abdominal cramping Dizziness Nausea Negative pregnancy test  P Discharge from MAU in stable condition Patient given the option of transfer to [facility] for further evaluation or seek care in outpatient facility of choice   Information of location of ED at [place] information given. Discussed option of repeating UPT in one week Discussed 3 days late menses can be normal variation, anovulation,  or very rarely pregnancy (in light of neg UPT) May call clinic in AM for appointment (didn't know she was still a patient there)  Warning signs for worsening condition that would warrant emergency follow-up discussed Patient may return to MAU as needed   [name] 07/10/2022 8:17 PM

## 2022-07-11 NOTE — Telephone Encounter (Signed)
Per chart review pt seen at MAU yesterday. Negative for pregnancy. Called pt who states she is okay today and does not have any additional concerns.

## 2022-11-08 ENCOUNTER — Other Ambulatory Visit: Payer: Self-pay

## 2022-11-08 ENCOUNTER — Emergency Department (HOSPITAL_BASED_OUTPATIENT_CLINIC_OR_DEPARTMENT_OTHER): Payer: BLUE CROSS/BLUE SHIELD

## 2022-11-08 ENCOUNTER — Encounter (HOSPITAL_BASED_OUTPATIENT_CLINIC_OR_DEPARTMENT_OTHER): Payer: Self-pay

## 2022-11-08 ENCOUNTER — Other Ambulatory Visit (HOSPITAL_BASED_OUTPATIENT_CLINIC_OR_DEPARTMENT_OTHER): Payer: Self-pay

## 2022-11-08 ENCOUNTER — Emergency Department (HOSPITAL_BASED_OUTPATIENT_CLINIC_OR_DEPARTMENT_OTHER)
Admission: EM | Admit: 2022-11-08 | Discharge: 2022-11-08 | Disposition: A | Discharge status: Home / Self Care | Payer: BLUE CROSS/BLUE SHIELD | Attending: Emergency Medicine | Admitting: Emergency Medicine

## 2022-11-08 DIAGNOSIS — R102 Pelvic and perineal pain: Secondary | ICD-10-CM

## 2022-11-08 DIAGNOSIS — R1030 Lower abdominal pain, unspecified: Secondary | ICD-10-CM | POA: Diagnosis present

## 2022-11-08 DIAGNOSIS — D259 Leiomyoma of uterus, unspecified: Secondary | ICD-10-CM | POA: Diagnosis not present

## 2022-11-08 DIAGNOSIS — R5383 Other fatigue: Secondary | ICD-10-CM

## 2022-11-08 DIAGNOSIS — Z91040 Latex allergy status: Secondary | ICD-10-CM | POA: Diagnosis not present

## 2022-11-08 LAB — URINALYSIS, ROUTINE W REFLEX MICROSCOPIC
Bilirubin Urine: NEGATIVE
Glucose, UA: NEGATIVE mg/dL
Ketones, ur: NEGATIVE mg/dL
Leukocytes,Ua: NEGATIVE
Nitrite: NEGATIVE
Protein, ur: NEGATIVE mg/dL
Specific Gravity, Urine: 1.022 (ref 1.005–1.030)
pH: 6 (ref 5.0–8.0)

## 2022-11-08 LAB — CBC WITH DIFFERENTIAL/PLATELET
Abs Immature Granulocytes: 0 10*3/uL (ref 0.00–0.07)
Basophils Absolute: 0 10*3/uL (ref 0.0–0.1)
Basophils Relative: 1 %
Eosinophils Absolute: 0.1 10*3/uL (ref 0.0–0.5)
Eosinophils Relative: 3 %
HCT: 30.3 % — ABNORMAL LOW (ref 36.0–46.0)
Hemoglobin: 9.8 g/dL — ABNORMAL LOW (ref 12.0–15.0)
Immature Granulocytes: 0 %
Lymphocytes Relative: 32 %
Lymphs Abs: 1.7 10*3/uL (ref 0.7–4.0)
MCH: 28.8 pg (ref 26.0–34.0)
MCHC: 32.3 g/dL (ref 30.0–36.0)
MCV: 89.1 fL (ref 80.0–100.0)
Monocytes Absolute: 0.3 10*3/uL (ref 0.1–1.0)
Monocytes Relative: 6 %
Neutro Abs: 3 10*3/uL (ref 1.7–7.7)
Neutrophils Relative %: 58 %
Platelets: 318 10*3/uL (ref 150–400)
RBC: 3.4 MIL/uL — ABNORMAL LOW (ref 3.87–5.11)
RDW: 13.6 % (ref 11.5–15.5)
WBC: 5.2 10*3/uL (ref 4.0–10.5)
nRBC: 0 % (ref 0.0–0.2)

## 2022-11-08 LAB — COMPREHENSIVE METABOLIC PANEL
ALT: 6 U/L (ref 0–44)
AST: 8 U/L — ABNORMAL LOW (ref 15–41)
Albumin: 3.9 g/dL (ref 3.5–5.0)
Alkaline Phosphatase: 45 U/L (ref 38–126)
Anion gap: 6 (ref 5–15)
BUN: 12 mg/dL (ref 6–20)
CO2: 25 mmol/L (ref 22–32)
Calcium: 8.6 mg/dL — ABNORMAL LOW (ref 8.9–10.3)
Chloride: 110 mmol/L (ref 98–111)
Creatinine, Ser: 0.64 mg/dL (ref 0.44–1.00)
GFR, Estimated: >60 mL/min (ref >60)
Glucose, Bld: 91 mg/dL (ref 70–99)
Potassium: 4 mmol/L (ref 3.5–5.1)
Sodium: 141 mmol/L (ref 135–145)
Total Bilirubin: 0.5 mg/dL (ref 0.3–1.2)
Total Protein: 6.5 g/dL (ref 6.5–8.1)

## 2022-11-08 LAB — LIPASE, BLOOD: Lipase: 22 U/L (ref 11–51)

## 2022-11-08 LAB — PREGNANCY, URINE: Preg Test, Ur: NEGATIVE

## 2022-11-08 LAB — MAGNESIUM: Magnesium: 2 mg/dL (ref 1.7–2.4)

## 2022-11-08 MED ORDER — ONDANSETRON HCL 4 MG/2ML IJ SOLN
4.0000 mg | Freq: Once | INTRAMUSCULAR | Status: AC
  Administered 2022-11-08: 4 mg via INTRAVENOUS
  Filled 2022-11-08: qty 2

## 2022-11-08 MED ORDER — IOHEXOL 300 MG/ML  SOLN
100.0000 mL | Freq: Once | INTRAMUSCULAR | Status: AC | PRN
  Administered 2022-11-08: 80 mL via INTRAVENOUS

## 2022-11-08 MED ORDER — SODIUM CHLORIDE 0.9 % IV BOLUS
1000.0000 mL | Freq: Once | INTRAVENOUS | Status: AC
  Administered 2022-11-08: 1000 mL via INTRAVENOUS

## 2022-11-08 NOTE — ED Triage Notes (Signed)
"  Sneezing, fatigue, aching, abdominal pain and diarrhea x 2 days, just not feeling good" per pt

## 2022-11-08 NOTE — Discharge Instructions (Addendum)
It was a pleasure taking care of you today!  There were no concerning emergent findings today on your labs.  Your CT scan showed a fibroid to the uterus that has decreased slightly in size from your most recent ultrasound and 2020.  Attached is information for local OB/GYN offices, you may call and set up a follow-up appointment regarding today's ED visit.  Attached is information for [facility] to establish care with a primary care provider.  Attached is also information for the [facility] department as well as [employer] health community health and wellness for establishment of primary care provider needs.  Return to the emergency department if you are experiencing increasing/worsening symptoms.

## 2022-11-08 NOTE — ED Provider Notes (Signed)
[place] EMERGENCY DEPARTMENT AT [facility] Provider Note   CSN: [id number] Arrival date & time: 11/08/22  0820     History  Chief Complaint  Patient presents with   Fatigue    Sonia Allen is a 37 y.o. female who presents to the ED with concerns for fatigue onset 2 days.  No sick contacts at work. Has associated sneezing, fatigue, lower abdominal pain, nausea, soft nonbloody stools.  Denies urinary symptoms, flank pain, numbness, tingling. Denies history of UTI or kidney stones.   The history is provided by the patient. No language interpreter was used.       Home Medications Prior to Admission medications   Medication Sig Start Date End Date Taking? Authorizing Provider  acetaminophen (TYLENOL) 500 MG tablet Take 1 tablet (500 mg total) by mouth every 6 (six) hours as needed. 11/20/21   Carlisle Beers, FNP  albuterol (VENTOLIN HFA) 108 (90 Base) MCG/ACT inhaler Inhale 1-2 puffs into the lungs every 6 (six) hours as needed for wheezing or shortness of breath. 11/20/21   Carlisle Beers, FNP  Guaifenesin 1200 MG TB12 Take 1 tablet (1,200 mg total) by mouth in the morning and at bedtime. 11/20/21   Carlisle Beers, FNP  loperamide (IMODIUM) 2 MG capsule Take 1 capsule (2 mg total) by mouth 4 (four) times daily as needed for diarrhea or loose stools. 08/16/21   Rhys Martini, PA-C  NIFEdipine (ADALAT CC) 30 MG 24 hr tablet Take 1 tablet (30 mg total) by mouth daily. 02/16/21   Cresenzo-Dishmon, Scarlette Calico, CNM  ondansetron (ZOFRAN-ODT) 8 MG disintegrating tablet Take 1 tablet (8 mg total) by mouth every 8 (eight) hours as needed for nausea or vomiting. 08/16/21   Rhys Martini, PA-C  oxyCODONE (ROXICODONE) 5 MG immediate release tablet Take 1 tablet (5 mg total) by mouth every 4 (four) hours as needed for severe pain. 02/16/21   Maness, Loistine Chance, MD  promethazine-dextromethorphan (PROMETHAZINE-DM) 6.25-15 MG/5ML syrup Take 5 mLs by mouth 4 (four) times daily as  needed for cough. 11/20/21   Carlisle Beers, FNP      Allergies    Latex    Review of Systems   Review of Systems  All other systems reviewed and are negative.   Physical Exam Updated Vital Signs BP 112/74   Pulse 60   Temp 98 F (36.7 C) (Oral)   Resp 17   Ht 5\' 4"  (1.626 m)   Wt 68 kg   SpO2 100%   BMI 25.75 kg/m  Physical Exam Vitals and nursing note reviewed.  Constitutional:      General: She is not in acute distress.    Appearance: She is not diaphoretic.  HENT:     Head: Normocephalic and atraumatic.     Mouth/Throat:     Pharynx: No oropharyngeal exudate.  Eyes:     General: No scleral icterus.    Conjunctiva/sclera: Conjunctivae normal.  Cardiovascular:     Rate and Rhythm: Normal rate and regular rhythm.     Pulses: Normal pulses.     Heart sounds: Normal heart sounds.  Pulmonary:     Effort: Pulmonary effort is normal. No respiratory distress.     Breath sounds: Normal breath sounds. No wheezing.  Abdominal:     General: Bowel sounds are normal.     Palpations: Abdomen is soft. There is no mass.     Tenderness: There is abdominal tenderness in the suprapubic area. There is no guarding or rebound.  Comments: TTP noted to suprapubic region.  Musculoskeletal:        General: Normal range of motion.     Cervical back: Normal range of motion and neck supple.  Skin:    General: Skin is warm and dry.  Neurological:     Mental Status: She is alert.  Psychiatric:        Behavior: Behavior normal.     ED Results / Procedures / Treatments   Labs (all labs ordered are listed, but only abnormal results are displayed) Labs Reviewed  CBC WITH DIFFERENTIAL/PLATELET - Abnormal; Notable for the following components:      Result Value   RBC 3.40 (*)    Hemoglobin 9.8 (*)    HCT 30.3 (*)    All other components within normal limits  COMPREHENSIVE METABOLIC PANEL - Abnormal; Notable for the following components:   Calcium 8.6 (*)    AST 8 (*)     All other components within normal limits  URINALYSIS, ROUTINE W REFLEX MICROSCOPIC - Abnormal; Notable for the following components:   Hgb urine dipstick TRACE (*)    Bacteria, UA RARE (*)    All other components within normal limits  LIPASE, BLOOD  PREGNANCY, URINE  MAGNESIUM    EKG None  Radiology CT ABDOMEN PELVIS W CONTRAST  Result Date: 11/08/2022 CLINICAL DATA:  BILATERAL lower quadrant pain, fatigue, aching, and diarrhea for EXAM: CT ABDOMEN AND PELVIS WITH CONTRAST TECHNIQUE: Multidetector CT imaging of the abdomen and pelvis was performed using the standard protocol following bolus administration of intravenous contrast. RADIATION DOSE REDUCTION: This exam was performed according to the departmental dose-optimization program which includes automated exposure control, adjustment of the mA and/or kV according to patient size and/or use of iterative reconstruction technique. CONTRAST:  80mL OMNIPAQUE IOHEXOL 300 MG/ML SOLN 2 days IV. No oral contrast. COMPARISON:  None Available. FINDINGS: Lower chest: Lung bases clear Hepatobiliary: Mild periportal edema and small amount of para cholecystic fluid. Distended IVC and hepatic veins. No focal hepatic mass or calcified gallstones. No biliary dilatation. Pancreas: Normal appearance Spleen: Normal appearance Adrenals/Urinary Tract: Adrenal glands, kidneys, ureters, and bladder normal appearance Stomach/Bowel: Appendix not visualized, no pericecal inflammatory process seen. Stomach and bowel loops unremarkable. Vascular/Lymphatic: Vascular structures patent.  No adenopathy. Reproductive: Large calcified leiomyoma RIGHT uterus 4.8 cm greatest size. Unremarkable ovaries/adnexa Other: Trace nonspecific free pelvic fluid. No free air. No hernia or masses. Lax anterior abdominal wall fascia without definite fascial defect. Musculoskeletal: Unremarkable IMPRESSION: Large calcified leiomyoma RIGHT uterus 4.8 cm greatest size. Mild periportal edema and small  amount of para cholecystic fluid, suspect related to distended IVC and hepatic veins question fluid overload. Trace nonspecific free pelvic fluid. No other intra-abdominal or intrapelvic abnormalities. Electronically Signed   By: Ulyses Southward M.D.   On: 11/08/2022 10:43    Procedures Procedures    Medications Ordered in ED Medications  sodium chloride 0.9 % bolus 1,000 mL (0 mLs Intravenous Stopped 11/08/22 1030)  ondansetron (ZOFRAN) injection 4 mg (4 mg Intravenous Given 11/08/22 0926)  iohexol (OMNIPAQUE) 300 MG/ML solution 100 mL (80 mLs Intravenous Contrast Given 11/08/22 1024)    ED Course/ Medical Decision Making/ A&P Clinical Course as of 11/08/22 1415  Fri Nov 08, 2022  1102 Pt re-evaluated and resting comfortably on stretcher. Reviewed with patient lab and imaging findings. Discussed with patient discharge treatment plan. Answered all available questions. Pt appears safe for discharge at this time.  [SB]    Clinical Course User Index [  SB] Neev Mcmains A, PA-C                             Medical Decision Making Amount and/or Complexity of Data Reviewed Labs: ordered. Radiology: ordered.  Risk Prescription drug management.   Patient presents to the emergency department with concerns for lower abdominal pain x 2 days.  Sick contacts at her place of employment.  Patient afebrile.  On exam patient with suprapubic abdominal tenderness to palpation.  Differential diagnosis includes appendicitis, cholecystitis, acute cystitis, diverticulitis, electrolyte abnormality, pregnancy.    Additional history obtained:  External records from outside source obtained and reviewed including: Transvaginal ultrasound from 2020 showed 5.1 x 4.5 x 4.1 cm calcified uterine fibroid in the right lateral uterine body.  Labs:  I ordered, and personally interpreted labs.  The pertinent results include:  Lipase unremarkable Negative pregnancy urine, negative urinalysis CMP unremarkable Magnesium  unremarkable CBC without leukocytosis, hemoglobin at 9.8, stable from previous value.  Imaging: I ordered imaging studies including CT AP I independently visualized and interpreted imaging which showed  Large calcified leiomyoma RIGHT uterus 4.8 cm greatest size.    Mild periportal edema and small amount of para cholecystic fluid,  suspect related to distended IVC and hepatic veins question fluid  overload.    Trace nonspecific free pelvic fluid.    No other intra-abdominal or intrapelvic abnormalities.   I agree with the radiologist interpretation  Medications:  I ordered medication including IVF and Zofran for symptom management.  Reevaluation of the patient after these medicines and interventions, I reevaluated the patient and found that they have improved I have reviewed the patients home medicines and have made adjustments as needed   Disposition: Pt presentation suspicious for uterine fibroid. Doubt concerns at this time for acute cystitis, diverticulitis, electrolyte abnormality, pregnancy. After consideration of the diagnostic results and the patients response to treatment, I feel that the patient would benefit from Discharge home. Work note provided. Pt instructed to follow up with OB/GYN regarding today's ED visit regarding her known history of uterine fibroid.  Fibroid is slightly decreased in size compared to transvaginal ultrasound from 2020.  Supportive care measures and strict return precautions discussed with patient at bedside. Pt acknowledges and verbalizes understanding. Pt appears safe for discharge. Follow up as indicated in discharge paperwork.   This chart was dictated using voice recognition software, Dragon. Despite the best efforts of this provider to proofread and correct errors, errors may still occur which can change documentation meaning.   Final Clinical Impression(s) / ED Diagnoses Final diagnoses:  Other fatigue  Suprapubic abdominal pain  Uterine  leiomyoma, unspecified location    Rx / DC Orders ED Discharge Orders     None         Wojciech Willetts A, PA-C 11/08/22 1557    Virgina Norfolk, DO 11/09/22 2956

## 2023-07-15 ENCOUNTER — Ambulatory Visit
Admission: EM | Admit: 2023-07-15 | Discharge: 2023-07-15 | Disposition: A | Discharge status: Home / Self Care | Payer: BLUE CROSS/BLUE SHIELD | Attending: Family Medicine | Admitting: Family Medicine

## 2023-07-15 DIAGNOSIS — B349 Viral infection, unspecified: Secondary | ICD-10-CM

## 2023-07-15 DIAGNOSIS — J4520 Mild intermittent asthma, uncomplicated: Secondary | ICD-10-CM

## 2023-07-15 MED ORDER — PREDNISONE 20 MG PO TABS: ORAL_TABLET | ORAL | 0 refills | Status: DC

## 2023-07-15 MED ORDER — PROMETHAZINE-DM 6.25-15 MG/5ML PO SYRP: 5.0000 mL | ORAL_SOLUTION | Freq: Three times a day (TID) | ORAL | 0 refills | Status: DC | PRN

## 2023-07-15 MED ORDER — ALBUTEROL SULFATE HFA 108 (90 BASE) MCG/ACT IN AERS: 1.0000 | INHALATION_SPRAY | Freq: Four times a day (QID) | RESPIRATORY_TRACT | 0 refills | Status: DC | PRN

## 2023-07-15 MED ORDER — CETIRIZINE HCL 10 MG PO TABS: 10.0000 mg | ORAL_TABLET | Freq: Every day | ORAL | 0 refills | Status: DC

## 2023-07-15 NOTE — ED Triage Notes (Signed)
 Pt reports cough, back pain, flank pain, blurry vision, lightheadedness and fatigue x 4 days. Theraflu gives relief. Pt think Theraflu is the cause of her feeling fatigue and lightheadedness.

## 2023-07-15 NOTE — ED Provider Notes (Signed)
 [facility]RG[facility]  Note:  This document was prepared using Conservation officer, historic buildings and may include unintentional dictation errors.  MRN: [id number] DOB: 1985/07/18  Subjective:   Sonia Allen is a 38 y.o. female presenting for 4-day history of acute onset coughing, chest tightness, shortness of breath.  Coughing elicits back and chest pain, abdominal pain.  Also experiences some dizziness and lightheadedness.  Has had malaise and fatigue.  Has a history of asthma.  Does not want a COVID or flu test.  No smoking of any kind including cigarettes, cigars, vaping, marijuana use.    No current facility-administered medications for this encounter.  Current Outpatient Medications:    Phenylephrine -Pheniramine-DM (THERAFLU COLD & COUGH PO), Take by mouth., Disp: , Rfl:    acetaminophen  (TYLENOL ) 500 MG tablet, Take 1 tablet (500 mg total) by mouth every 6 (six) hours as needed., Disp: 30 tablet, Rfl: 0   albuterol  (VENTOLIN  HFA) 108 (90 Base) MCG/ACT inhaler, Inhale 1-2 puffs into the lungs every 6 (six) hours as needed for wheezing or shortness of breath., Disp: 18 g, Rfl: 1   Guaifenesin  1200 MG TB12, Take 1 tablet (1,200 mg total) by mouth in the morning and at bedtime., Disp: 14 tablet, Rfl: 0   loperamide  (IMODIUM ) 2 MG capsule, Take 1 capsule (2 mg total) by mouth 4 (four) times daily as needed for diarrhea or loose stools., Disp: 12 capsule, Rfl: 0   NIFEdipine  (ADALAT  CC) 30 MG 24 hr tablet, Take 1 tablet (30 mg total) by mouth daily., Disp: 90 tablet, Rfl: 0   ondansetron  (ZOFRAN -ODT) 8 MG disintegrating tablet, Take 1 tablet (8 mg total) by mouth every 8 (eight) hours as needed for nausea or vomiting., Disp: 20 tablet, Rfl: 0   oxyCODONE  (ROXICODONE ) 5 MG immediate release tablet, Take 1 tablet (5 mg total) by mouth every 4 (four) hours as needed for severe pain., Disp: 30 tablet, Rfl: 0   promethazine -dextromethorphan (PROMETHAZINE -DM) 6.25-15 MG/5ML syrup, Take  5 mLs by mouth 4 (four) times daily as needed for cough., Disp: 118 mL, Rfl: 0   Allergies  Allergen Reactions   Latex Rash    Burning sensation    Past Medical History:  Diagnosis Date   Anemia    first and sec pregnancies   Arrhythmia    Asymptomatic bacteriuria during pregnancy in first trimester 07/09/2019   E.coli and enteroccus faecalis in urine at [facility]; RX for keflex  sent.    Chlamydia    Gestational hypertension 12/17/2017   intrapartum   Gonorrhea    Headache(784.0)    Hemorrhoids    Hx of migraine headaches    Leiomyoma of uterus in pregnancy    Marijuana use 06/16/2017   Positive screen on initial OB     Past Surgical History:  Procedure Laterality Date   INDUCED ABORTION  March  2 ,2013   TUBAL LIGATION Bilateral 02/15/2021   Procedure: POST PARTUM TUBAL LIGATION;  Surgeon: Eldonna Suzen Octave, MD;  Location: [facility] LD ORS;  Service: Gynecology;  Laterality: Bilateral;   WISDOM TOOTH EXTRACTION      Family History  Problem Relation Age of Onset   Healthy Mother    Healthy Father    Other Neg Hx    Alcohol abuse Neg Hx    Arthritis Neg Hx    Asthma Neg Hx    Birth defects Neg Hx    Cancer Neg Hx    COPD Neg Hx    Depression Neg Hx  Diabetes Neg Hx    Drug abuse Neg Hx    Early death Neg Hx    Heart disease Neg Hx    Hearing loss Neg Hx    Hyperlipidemia Neg Hx    Hypertension Neg Hx    Kidney disease Neg Hx    Learning disabilities Neg Hx    Mental illness Neg Hx    Mental retardation Neg Hx    Miscarriages / Stillbirths Neg Hx    Stroke Neg Hx    Vision loss Neg Hx     Social History   Tobacco Use   Smoking status: Never   Smokeless tobacco: Never  Vaping Use   Vaping status: Never Used  Substance Use Topics   Alcohol use: Not Currently    Comment: Pt reports socially   Drug use: Not Currently    Frequency: 14.0 times per week    Types: Marijuana    Comment: Reports not currently smoking    ROS   Objective:   Vitals: BP  123/78 (BP Location: Right Arm)   Pulse 85   Temp 98.8 F (37.1 C) (Oral)   Resp 18   LMP 07/13/2023 (Exact Date)   SpO2 98%   Physical Exam Constitutional:      General: She is not in acute distress.    Appearance: Normal appearance. She is well-developed and normal weight. She is not ill-appearing, toxic-appearing or diaphoretic.  HENT:     Head: Normocephalic and atraumatic.     Right Ear: Tympanic membrane, ear canal and external ear normal. No drainage or tenderness. No middle ear effusion. There is no impacted cerumen. Tympanic membrane is not erythematous or bulging.     Left Ear: Tympanic membrane, ear canal and external ear normal. No drainage or tenderness.  No middle ear effusion. There is no impacted cerumen. Tympanic membrane is not erythematous or bulging.     Nose: Nose normal. No congestion or rhinorrhea.     Mouth/Throat:     Mouth: Mucous membranes are moist. No oral lesions.     Pharynx: Oropharynx is clear. No pharyngeal swelling, oropharyngeal exudate, posterior oropharyngeal erythema or uvula swelling.     Tonsils: No tonsillar exudate or tonsillar abscesses.  Eyes:     General: No scleral icterus.       Right eye: No discharge.        Left eye: No discharge.     Extraocular Movements: Extraocular movements intact.     Right eye: Normal extraocular motion.     Left eye: Normal extraocular motion.     Conjunctiva/sclera: Conjunctivae normal.  Cardiovascular:     Rate and Rhythm: Normal rate and regular rhythm.     Heart sounds: Normal heart sounds. No murmur heard.    No friction rub. No gallop.  Pulmonary:     Effort: Pulmonary effort is normal. No respiratory distress.     Breath sounds: No stridor. No wheezing, rhonchi or rales.  Chest:     Chest wall: No tenderness.  Abdominal:     General: Bowel sounds are normal. There is no distension.     Palpations: Abdomen is soft. There is no mass.     Tenderness: There is no abdominal tenderness. There is no  right CVA tenderness, left CVA tenderness, guarding or rebound.  Musculoskeletal:     Cervical back: Normal range of motion and neck supple.  Lymphadenopathy:     Cervical: No cervical adenopathy.  Skin:    General: Skin is  warm and dry.  Neurological:     General: No focal deficit present.     Mental Status: She is alert and oriented to person, place, and time.  Psychiatric:        Mood and Affect: Mood normal.        Behavior: Behavior normal.        Thought Content: Thought content normal.        Judgment: Judgment normal.     Assessment and Plan :   PDMP not reviewed this encounter.  1. Acute viral syndrome   2. Mild intermittent asthma, uncomplicated    Deferred imaging given clear cardiopulmonary exam, hemodynamically stable vital signs. Recommended a oral prednisone  course in the context of her respiratory symptoms and asthma.  Suspect viral URI, viral syndrome. Physical exam findings reassuring and vital signs stable for discharge. Advised supportive care, offered symptomatic relief. Counseled patient on potential for adverse effects with medications prescribed/recommended today, ER and return-to-clinic precautions discussed, patient verbalized understanding.     Christopher Savannah, [place] 07/15/23 1812

## 2023-07-15 NOTE — Discharge Instructions (Signed)
 We will manage this as a viral syndrome. For sore throat or cough try using a honey-based tea. Use 3 teaspoons of honey with juice squeezed from half lemon. Place shaved pieces of ginger into 1/2-1 cup of water and warm over stove top. Then mix the ingredients and repeat every 4 hours as needed. Please take Tylenol  500mg -650mg  once every 6 hours for fevers, aches and pains. Hydrate very well with at least 2 liters (64 ounces) of water. Eat light meals such as soups (chicken and noodles, chicken wild rice, vegetable).  Do not eat any foods that you are allergic to.  Start an antihistamine like Zyrtec  (10mg  daily) for postnasal drainage, sinus congestion.  You can take this together with albuterol  and prednisone  for your asthma and respiratory symptoms. Use cough syrup as needed.

## 2023-10-03 ENCOUNTER — Ambulatory Visit
Admission: EM | Admit: 2023-10-03 | Discharge: 2023-10-03 | Disposition: A | Discharge status: Home / Self Care | Attending: Internal Medicine | Admitting: Internal Medicine

## 2023-10-03 DIAGNOSIS — R102 Pelvic and perineal pain: Secondary | ICD-10-CM | POA: Insufficient documentation

## 2023-10-03 DIAGNOSIS — L089 Local infection of the skin and subcutaneous tissue, unspecified: Secondary | ICD-10-CM | POA: Diagnosis present

## 2023-10-03 DIAGNOSIS — N3001 Acute cystitis with hematuria: Secondary | ICD-10-CM | POA: Insufficient documentation

## 2023-10-03 DIAGNOSIS — S90822A Blister (nonthermal), left foot, initial encounter: Secondary | ICD-10-CM | POA: Diagnosis present

## 2023-10-03 LAB — POCT URINALYSIS DIP (MANUAL ENTRY)
Glucose, UA: NEGATIVE mg/dL
Nitrite, UA: NEGATIVE
Protein Ur, POC: 100 mg/dL — AB
Spec Grav, UA: >=1.03 — AB (ref 1.010–1.025)
Urobilinogen, UA: 1 U/dL
pH, UA: 6 (ref 5.0–8.0)

## 2023-10-03 LAB — POCT URINE PREGNANCY: Preg Test, Ur: NEGATIVE

## 2023-10-03 MED ORDER — ONDANSETRON 4 MG PO TBDP: 4.0000 mg | ORAL_TABLET | Freq: Three times a day (TID) | ORAL | 0 refills | Status: DC | PRN

## 2023-10-03 MED ORDER — TRAMADOL HCL 50 MG PO TABS: 50.0000 mg | ORAL_TABLET | Freq: Four times a day (QID) | ORAL | 0 refills | Status: AC | PRN

## 2023-10-03 MED ORDER — AMOXICILLIN-POT CLAVULANATE 875-125 MG PO TABS: 1.0000 | ORAL_TABLET | Freq: Two times a day (BID) | ORAL | 0 refills | Status: AC

## 2023-10-03 NOTE — Discharge Instructions (Addendum)
 In regards to your left foot, we are unsure the exact nature of the area on the left plantar foot but given the appearance and pain we recommend that this be seen and evaluated by podiatry.  We will treat as followed: Augmentin 875 mg twice daily for 10 days.  This is an antibiotic.  Take this with food.  Tramadol 50 mg every 6 hours as needed for pain.  Call [facility] to schedule an appointment at [phone]. Wear post-op shoe to help with pain.  Go to the ER if foot symptoms worsen prior to your appointment with podiatry.  For the abdominal pain/nausea, urinalysis, urine pregnancy and cervical swab done today. Urine pregnancy is negative. The urinalysis does appear to show an infectious process as well as dehydration.  We will treat this with the following: Augmentin as listed above. Zofran 4 mg orally disintegrating tablet every 8 hours as needed for nausea.  Stay hydrated. Drink plenty of water even if you do not feel like taking in solid food. If symptoms do not improve or worsening, then go to the ER for further evaluation.  Screening swab done today and results will be available in 24-48 hours. We will contact you if we need to arrange additional treatment based on your testing. Negative results will be on your MyChart account.

## 2023-10-03 NOTE — ED Provider Notes (Signed)
 [facility]    CSN: [id number] Arrival date & time: 10/03/23  1314      History   Chief Complaint No chief complaint on file.   HPI Sonia Allen is a 38 y.o. female.   38 y.o. female who presents to urgent care with complaints of suprapubic abdominal pain with nausea and fevers for 3 weeks and left plantar foot pain for 2 days.    In regards to the abdominal pain, the patient reports pressure and pain in the suprapubic region of the abdomen.  This has gotten somewhat worse in the last couple of days with decreased appetite.  She reports she has not eaten much in the last 2 days although she is trying to stay hydrated.  She also had 1 episode of diarrhea.  She denies any vomiting.  She denies any dysuria but does have pressure and some difficulty starting her urine stream.  She has also noticed in the last day a very thin clear vaginal discharge.  She has been having intercourse but does not know if she has had any STI exposure.  Her period is very irregular at baseline.  She reports that the abdominal pain is somewhat worse at night.  She denies any acid reflux, sick contacts, headaches.  She does not have a PCP.  She has had problems with her abdomen in the past but this has been different.  In regards to her left foot, about 2 days ago she developed 2 blisters on her foot.  One on the medial plantar aspect and 1 in the middle on the plantar aspect.  The medial area ruptured and is almost resolved however the one on the plantar aspect has increased in size and is very painful making it difficult to walk.  The patient relates that she has had these in the past in different locations on the bottom of the foot.  She has had 1 that was nearly as severe as this 1 that was more distal.  This 1 resolved on its own.  She has not had any evaluation for these in the past as they normally go away on their own.  She denies any injury to the area.  She has been putting an over-the-counter ointment  on the area.     Past Medical History:  Diagnosis Date   Anemia    first and sec pregnancies   Arrhythmia    Asymptomatic bacteriuria during pregnancy in first trimester 07/09/2019   E.coli and enteroccus faecalis in urine at [facility]; RX for keflex sent.    Chlamydia    Gestational hypertension 12/17/2017   intrapartum   Gonorrhea    Headache(784.0)    Hemorrhoids    Hx of migraine headaches    Leiomyoma of uterus in pregnancy    Marijuana use 06/16/2017   Positive screen on initial OB    Patient Active Problem List   Diagnosis Date Noted   Status post bilateral salpingectomy 02/15/2021   Grand multiparity 02/14/2021   Fibroids 06/18/2019    Past Surgical History:  Procedure Laterality Date   INDUCED ABORTION  March  2 ,2013   TUBAL LIGATION Bilateral 02/15/2021   Procedure: POST PARTUM TUBAL LIGATION;  Surgeon: Federico Flake, MD;  Location: [facility];  Service: Gynecology;  Laterality: Bilateral;   WISDOM TOOTH EXTRACTION      OB History     Gravida  10   Para  7   Term  7   Preterm  0  AB  3   Living  7      SAB  1   IAB  2   Ectopic  0   Multiple  0   Live Births  7            Home Medications    Prior to Admission medications   Medication Sig Start Date End Date Taking? Authorizing Provider  albuterol (VENTOLIN HFA) 108 (90 Base) MCG/ACT inhaler Inhale 1-2 puffs into the lungs every 6 (six) hours as needed for wheezing or shortness of breath. 07/15/23   Wallis Bamberg, PA-C  cetirizine (ZYRTEC ALLERGY) 10 MG tablet Take 1 tablet (10 mg total) by mouth daily. 07/15/23   Wallis Bamberg, PA-C  predniSONE (DELTASONE) 20 MG tablet Take 2 tablets daily with breakfast. 07/15/23   Wallis Bamberg, PA-C  promethazine-dextromethorphan (PROMETHAZINE-DM) 6.25-15 MG/5ML syrup Take 5 mLs by mouth 3 (three) times daily as needed for cough. 07/15/23   Wallis Bamberg, PA-C    Family History Family History  Problem Relation Age of Onset   Healthy Mother     Healthy Father    Other Neg Hx    Alcohol abuse Neg Hx    Arthritis Neg Hx    Asthma Neg Hx    Birth defects Neg Hx    Cancer Neg Hx    COPD Neg Hx    Depression Neg Hx    Diabetes Neg Hx    Drug abuse Neg Hx    Early death Neg Hx    Heart disease Neg Hx    Hearing loss Neg Hx    Hyperlipidemia Neg Hx    Hypertension Neg Hx    Kidney disease Neg Hx    Learning disabilities Neg Hx    Mental illness Neg Hx    Mental retardation Neg Hx    Miscarriages / Stillbirths Neg Hx    Stroke Neg Hx    Vision loss Neg Hx     Social History Social History   Tobacco Use   Smoking status: Never   Smokeless tobacco: Never  Vaping Use   Vaping status: Never Used  Substance Use Topics   Alcohol use: Not Currently    Comment: Pt reports socially   Drug use: Not Currently    Frequency: 14.0 times per week    Types: Marijuana    Comment: Reports not currently smoking     Allergies   Latex   Review of Systems Review of Systems  Constitutional:  Positive for fever. Negative for chills.  HENT:  Negative for ear pain and sore throat.   Eyes:  Negative for pain and visual disturbance.  Respiratory:  Negative for cough and shortness of breath.   Cardiovascular:  Positive for leg swelling (left foot swelling). Negative for chest pain and palpitations.  Gastrointestinal:  Positive for abdominal pain (suprapubic for 3 weeks on and off), diarrhea and nausea. Negative for vomiting.  Genitourinary:  Positive for vaginal discharge (clear for 2 days). Negative for dysuria and hematuria.  Musculoskeletal:  Negative for arthralgias and back pain.       Left foot pain  Skin:  Negative for color change and rash.  Neurological:  Negative for seizures and syncope.  All other systems reviewed and are negative.    Physical Exam Triage Vital Signs ED Triage Vitals  Encounter Vitals Group     BP      Systolic BP Percentile      Diastolic BP Percentile  Pulse      Resp      Temp       Temp src      SpO2      Weight      Height      Head Circumference      Peak Flow      Pain Score      Pain Loc      Pain Education      Exclude from Growth Chart    No data found.  Updated Vital Signs There were no vitals taken for this visit.  Visual Acuity Right Eye Distance:   Left Eye Distance:   Bilateral Distance:    Right Eye Near:   Left Eye Near:    Bilateral Near:     Physical Exam Vitals and nursing note reviewed.  Constitutional:      General: She is not in acute distress.    Appearance: She is well-developed.  HENT:     Head: Normocephalic and atraumatic.  Eyes:     Conjunctiva/sclera: Conjunctivae normal.  Cardiovascular:     Rate and Rhythm: Normal rate and regular rhythm.     Pulses:          Dorsalis pedis pulses are 2+ on the left side.       Posterior tibial pulses are 2+ on the left side.     Heart sounds: No murmur heard. Pulmonary:     Effort: Pulmonary effort is normal. No respiratory distress.     Breath sounds: Normal breath sounds.  Abdominal:     General: Bowel sounds are normal. There is no distension.     Palpations: Abdomen is soft.     Tenderness: There is abdominal tenderness in the suprapubic area. There is no right CVA tenderness, left CVA tenderness, guarding or rebound.  Musculoskeletal:        General: No swelling.     Cervical back: Neck supple.       Feet:  Feet:     Left foot:     Skin integrity: Blister and erythema present.     Toenail Condition: Left toenails are normal.  Skin:    General: Skin is warm and dry.     Capillary Refill: Capillary refill takes less than 2 seconds.  Neurological:     Mental Status: She is alert.  Psychiatric:        Mood and Affect: Mood normal.      UC Treatments / Results  Labs (all labs ordered are listed, but only abnormal results are displayed) Labs Reviewed - No data to display  EKG   Radiology No results found.  Procedures Procedures (including critical care  time)  Medications Ordered in UC Medications - No data to display  Initial Impression / Assessment and Plan / UC Course  I have reviewed the triage vital signs and the nursing notes.  Pertinent labs & imaging results that were available during my care of the patient were reviewed by me and considered in my medical decision making (see chart for details).     Infected blister of left foot, initial encounter  Suprapubic abdominal pain  Acute cystitis with hematuria  For the left foot, Unsure exact nature of the area on the left plantar foot but given the appearance and tenderness we feel that this is better treated and evaluated by podiatry.  We will cover for infectious process as outlined: Augmentin 875 mg twice daily for 10 days.  This is an  antibiotic.  Take this with food.  Tramadol 50 mg every 6 hours as needed for pain.  Call [facility] to schedule an appointment at [phone]. Wear post-op shoe to help with pain.  Go to the ER if foot symptoms worsen prior to your appointment with podiatry.  For the abdominal pain/nausea, urinalysis, urine pregnancy and cervical swab done today. Urine pregnancy is negative. The urinalysis does appear to show an infectious process as well as dehydration.  We will treat this with the following: Augmentin as listed above. Zofran 4 mg orally disintegrating tablet every 8 hours as needed for nausea.  Stay hydrated. Drink plenty of water even if you do not feel like taking in solid food. If symptoms do not improve or worsening, then go to the ER for further evaluation.  Screening swab done today and results will be available in 24-48 hours. We will contact you if we need to arrange additional treatment based on your testing. Negative results will be on your MyChart account.  Final Clinical Impressions(s) / UC Diagnoses   Final diagnoses:  None   Discharge Instructions   None    ED Prescriptions   None    PDMP not reviewed this  encounter.   Landis Martins, [place] 10/03/23 1503

## 2023-10-03 NOTE — ED Triage Notes (Signed)
 Pt c/o sores to bottom of lt foot with swelling x2 days. C/o nausea and diarrhea x2 days with no appetite. Denies taken any meds for sx's

## 2023-10-06 ENCOUNTER — Telehealth (HOSPITAL_COMMUNITY): Payer: Self-pay

## 2023-10-06 LAB — CERVICOVAGINAL ANCILLARY ONLY
Bacterial Vaginitis (gardnerella): POSITIVE — AB
Chlamydia: NEGATIVE
Comment: NEGATIVE
Comment: NEGATIVE
Comment: NEGATIVE
Comment: NORMAL
Neisseria Gonorrhea: NEGATIVE
Trichomonas: POSITIVE — AB

## 2023-10-06 MED ORDER — METRONIDAZOLE 500 MG PO TABS: 500.0000 mg | ORAL_TABLET | Freq: Two times a day (BID) | ORAL | 0 refills | Status: AC

## 2023-10-06 NOTE — Telephone Encounter (Signed)
 Per protocol, pt requires tx with metronidazole. Attempted to reach patient x1. LVM.  Rx sent to pharmacy on file.

## 2023-10-26 ENCOUNTER — Other Ambulatory Visit: Payer: Self-pay

## 2023-10-26 ENCOUNTER — Emergency Department (HOSPITAL_COMMUNITY)
Admission: EM | Admit: 2023-10-26 | Discharge: 2023-10-26 | Disposition: A | Discharge status: Home / Self Care | Attending: Emergency Medicine | Admitting: Emergency Medicine

## 2023-10-26 DIAGNOSIS — L03012 Cellulitis of left finger: Secondary | ICD-10-CM | POA: Diagnosis not present

## 2023-10-26 DIAGNOSIS — N390 Urinary tract infection, site not specified: Secondary | ICD-10-CM | POA: Insufficient documentation

## 2023-10-26 DIAGNOSIS — R109 Unspecified abdominal pain: Secondary | ICD-10-CM | POA: Diagnosis present

## 2023-10-26 LAB — URINALYSIS, ROUTINE W REFLEX MICROSCOPIC
Bilirubin Urine: NEGATIVE
Glucose, UA: NEGATIVE mg/dL
Ketones, ur: NEGATIVE mg/dL
Nitrite: NEGATIVE
Protein, ur: 30 mg/dL — AB
Specific Gravity, Urine: 1.024 (ref 1.005–1.030)
pH: 7 (ref 5.0–8.0)

## 2023-10-26 LAB — COMPREHENSIVE METABOLIC PANEL WITH GFR
ALT: 12 U/L (ref 0–44)
AST: 13 U/L — ABNORMAL LOW (ref 15–41)
Albumin: 3.9 g/dL (ref 3.5–5.0)
Alkaline Phosphatase: 48 U/L (ref 38–126)
Anion gap: 6 (ref 5–15)
BUN: 15 mg/dL (ref 6–20)
CO2: 25 mmol/L (ref 22–32)
Calcium: 8.9 mg/dL (ref 8.9–10.3)
Chloride: 109 mmol/L (ref 98–111)
Creatinine, Ser: 0.66 mg/dL (ref 0.44–1.00)
GFR, Estimated: >60 mL/min (ref >60)
Glucose, Bld: 84 mg/dL (ref 70–99)
Potassium: 3.6 mmol/L (ref 3.5–5.1)
Sodium: 140 mmol/L (ref 135–145)
Total Bilirubin: 0.8 mg/dL (ref 0.0–1.2)
Total Protein: 7.4 g/dL (ref 6.5–8.1)

## 2023-10-26 LAB — CBC
HCT: 31.9 % — ABNORMAL LOW (ref 36.0–46.0)
Hemoglobin: 10 g/dL — ABNORMAL LOW (ref 12.0–15.0)
MCH: 27.4 pg (ref 26.0–34.0)
MCHC: 31.3 g/dL (ref 30.0–36.0)
MCV: 87.4 fL (ref 80.0–100.0)
Platelets: 371 10*3/uL (ref 150–400)
RBC: 3.65 MIL/uL — ABNORMAL LOW (ref 3.87–5.11)
RDW: 14.8 % (ref 11.5–15.5)
WBC: 6.6 10*3/uL (ref 4.0–10.5)
nRBC: 0 % (ref 0.0–0.2)

## 2023-10-26 LAB — LIPASE, BLOOD: Lipase: 30 U/L (ref 11–51)

## 2023-10-26 LAB — HCG, SERUM, QUALITATIVE: Preg, Serum: NEGATIVE

## 2023-10-26 MED ORDER — ONDANSETRON 4 MG PO TBDP
4.0000 mg | ORAL_TABLET | Freq: Once | ORAL | Status: AC
  Administered 2023-10-26: 4 mg via ORAL
  Filled 2023-10-26: qty 1

## 2023-10-26 NOTE — ED Triage Notes (Addendum)
 Pt c/o N/V, gen body aches, and malaise for several days.  Abscess on L pointer finger  No known sick contacts.  AOx4

## 2023-10-26 NOTE — ED Provider Notes (Signed)
 [place] EMERGENCY DEPARTMENT AT [facility] Provider Note   CSN: [id number] Arrival date & time: 10/26/23  1129     History  Chief Complaint  Patient presents with   Emesis   Abdominal Pain   Generalized Body Aches   Abscess    Sonia Allen is a 38 y.o. female.  This is a 38 year old female with history of migraine presenting following several bouts of emesis which began yesterday evening.  Patient states that she had some leftover chicken, green beans, mac & cheese that day and about 4 hours later he had several bouts of emesis.  They also endorse some abdominal pain which has been going on for several months.  They do have history of UTI in the past and stated that this feels similar to prior UTI.  They also endorses a headache which began yesterday which associated with blurry vision and dizziness.  They state that this is mostly spontaneously resolved at this morning.  Otherwise they deny fevers, chills, or night sweats.  They are sexually active with a new sexual partner, did not use barrier contraception.  She does have a history of tubal ligation in the past.  Patient denies vaginal discharge, pelvic pain.    Emesis Associated symptoms: abdominal pain and headaches   Associated symptoms: no chills, no diarrhea and no fever   Abdominal Pain Associated symptoms: nausea and vomiting   Associated symptoms: no chills, no constipation, no diarrhea, no fatigue, no fever and no vaginal discharge   Abscess Associated symptoms: headaches, nausea and vomiting   Associated symptoms: no fatigue and no fever        Home Medications Prior to Admission medications   Medication Sig Start Date End Date Taking? Authorizing Provider  ondansetron  (ZOFRAN -ODT) 4 MG disintegrating tablet Take 1 tablet (4 mg total) by mouth every 8 (eight) hours as needed for nausea or vomiting. 10/03/23   Kreg Pesa, PA-C  traMADol  (ULTRAM ) 50 MG tablet Take 1 tablet (50 mg total) by  mouth every 6 (six) hours as needed. 10/03/23   Kreg Pesa, PA-C      Allergies    Latex    Review of Systems   Review of Systems  Constitutional:  Negative for chills, fatigue and fever.  Gastrointestinal:  Positive for abdominal pain, nausea and vomiting. Negative for constipation and diarrhea.  Genitourinary:  Positive for pelvic pain. Negative for vaginal discharge.  Neurological:  Positive for dizziness and headaches.    Physical Exam Updated Vital Signs BP 123/87   Pulse 71   Temp 98.1 F (36.7 C) (Oral)   Resp 18   Ht 5\' 4"  (1.626 m)   SpO2 100%   BMI 25.75 kg/m  Physical Exam Constitutional:      Appearance: She is well-developed.  Cardiovascular:     Rate and Rhythm: Normal rate and regular rhythm.  Pulmonary:     Effort: Pulmonary effort is normal.     Breath sounds: Normal breath sounds.  Abdominal:     General: There is no distension.     Palpations: Abdomen is soft.     Tenderness: There is abdominal tenderness in the suprapubic area.  Skin:    General: Skin is warm and dry.  Neurological:     Mental Status: She is alert.     ED Results / Procedures / Treatments   Labs (all labs ordered are listed, but only abnormal results are displayed) Labs Reviewed  COMPREHENSIVE METABOLIC PANEL WITH GFR -  Abnormal; Notable for the following components:      Result Value   AST 13 (*)    All other components within normal limits  CBC - Abnormal; Notable for the following components:   RBC 3.65 (*)    Hemoglobin 10.0 (*)    HCT 31.9 (*)    All other components within normal limits  URINALYSIS, ROUTINE W REFLEX MICROSCOPIC - Abnormal; Notable for the following components:   APPearance HAZY (*)    Hgb urine dipstick LARGE (*)    Protein, ur 30 (*)    Leukocytes,Ua SMALL (*)    Bacteria, UA MANY (*)    All other components within normal limits  LIPASE, BLOOD  HCG, SERUM, QUALITATIVE    EKG None  Radiology No results  found.  Procedures Procedures    Medications Ordered in ED Medications  ondansetron  (ZOFRAN -ODT) disintegrating tablet 4 mg (4 mg Oral Given 10/26/23 1321)    ED Course/ Medical Decision Making/ A&P                                 Medical Decision Making Amount and/or Complexity of Data Reviewed Labs: ordered.  Risk Prescription drug management.   Overall this is a 38 year old female presenting with headache nausea vomiting as well as ongoing abdominal pain.  Overall lab work demonstrated stable anemia, normal kidney function, liver function, and electrolytes.  Lipase negative, urinalysis consistent with UTI with hemoglobin, protein, leukocytes, bacteria.  Beta-hCG negative  Patient was seen in ED on 10/03/2023 at which time she was found to have trichomonas and BV.  She was given a prescription for metronidazole  and took her first dose yesterday.  Advised patient to continue taking metronidazole  to complete her prescription.  Unclear etiology of her nausea vomiting, may be related to migraine or foodborne toxin.  At this time she is having minimal nausea and has not had emesis in some hours.  I imagine this will continue to improve/resolve with time.         Final Clinical Impression(s) / ED Diagnoses Final diagnoses:  Lower urinary tract infectious disease    Rx / DC Orders ED Discharge Orders     None         Sheree Dieter, MD 10/26/23 1400    Almond Army, MD 10/28/23 2128

## 2023-11-30 ENCOUNTER — Emergency Department (HOSPITAL_COMMUNITY)

## 2023-11-30 ENCOUNTER — Emergency Department (HOSPITAL_COMMUNITY)
Admission: EM | Admit: 2023-11-30 | Discharge: 2023-11-30 | Disposition: A | Discharge status: Home / Self Care | Attending: Emergency Medicine | Admitting: Emergency Medicine

## 2023-11-30 ENCOUNTER — Other Ambulatory Visit: Payer: Self-pay

## 2023-11-30 DIAGNOSIS — D219 Benign neoplasm of connective and other soft tissue, unspecified: Secondary | ICD-10-CM

## 2023-11-30 DIAGNOSIS — N852 Hypertrophy of uterus: Secondary | ICD-10-CM | POA: Diagnosis not present

## 2023-11-30 DIAGNOSIS — D259 Leiomyoma of uterus, unspecified: Secondary | ICD-10-CM | POA: Diagnosis not present

## 2023-11-30 DIAGNOSIS — R1030 Lower abdominal pain, unspecified: Secondary | ICD-10-CM | POA: Diagnosis present

## 2023-11-30 DIAGNOSIS — Z91040 Latex allergy status: Secondary | ICD-10-CM | POA: Insufficient documentation

## 2023-11-30 LAB — CBC
HCT: 34.8 % — ABNORMAL LOW (ref 36.0–46.0)
Hemoglobin: 10.8 g/dL — ABNORMAL LOW (ref 12.0–15.0)
MCH: 27 pg (ref 26.0–34.0)
MCHC: 31 g/dL (ref 30.0–36.0)
MCV: 87 fL (ref 80.0–100.0)
Platelets: 356 10*3/uL (ref 150–400)
RBC: 4 MIL/uL (ref 3.87–5.11)
RDW: 14.8 % (ref 11.5–15.5)
WBC: 6.6 10*3/uL (ref 4.0–10.5)
nRBC: 0 % (ref 0.0–0.2)

## 2023-11-30 LAB — HCG, SERUM, QUALITATIVE: Preg, Serum: NEGATIVE

## 2023-11-30 LAB — COMPREHENSIVE METABOLIC PANEL WITH GFR
ALT: 12 U/L (ref 0–44)
AST: 13 U/L — ABNORMAL LOW (ref 15–41)
Albumin: 4.3 g/dL (ref 3.5–5.0)
Alkaline Phosphatase: 54 U/L (ref 38–126)
Anion gap: 8 (ref 5–15)
BUN: 16 mg/dL (ref 6–20)
CO2: 26 mmol/L (ref 22–32)
Calcium: 9.5 mg/dL (ref 8.9–10.3)
Chloride: 103 mmol/L (ref 98–111)
Creatinine, Ser: 0.63 mg/dL (ref 0.44–1.00)
GFR, Estimated: >60 mL/min (ref >60)
Glucose, Bld: 89 mg/dL (ref 70–99)
Potassium: 4 mmol/L (ref 3.5–5.1)
Sodium: 137 mmol/L (ref 135–145)
Total Bilirubin: 0.2 mg/dL (ref 0.0–1.2)
Total Protein: 7.7 g/dL (ref 6.5–8.1)

## 2023-11-30 LAB — URINALYSIS, ROUTINE W REFLEX MICROSCOPIC
Bacteria, UA: NONE SEEN
Bilirubin Urine: NEGATIVE
Glucose, UA: NEGATIVE mg/dL
Ketones, ur: NEGATIVE mg/dL
Leukocytes,Ua: NEGATIVE
Nitrite: NEGATIVE
Protein, ur: NEGATIVE mg/dL
Specific Gravity, Urine: 1.023 (ref 1.005–1.030)
pH: 6 (ref 5.0–8.0)

## 2023-11-30 LAB — LIPASE, BLOOD: Lipase: 40 U/L (ref 11–51)

## 2023-11-30 MED ORDER — KETOROLAC TROMETHAMINE 30 MG/ML IJ SOLN
30.0000 mg | Freq: Once | INTRAMUSCULAR | Status: AC
  Administered 2023-11-30: 30 mg via INTRAVENOUS
  Filled 2023-11-30: qty 1

## 2023-11-30 MED ORDER — IBUPROFEN 800 MG PO TABS: 800.0000 mg | ORAL_TABLET | Freq: Three times a day (TID) | ORAL | 1 refills | Status: DC | PRN

## 2023-11-30 NOTE — Discharge Instructions (Signed)
 Follow-up with a GYN doctor.  Return if problems

## 2023-11-30 NOTE — ED Provider Notes (Signed)
 [place] EMERGENCY DEPARTMENT AT [facility] Provider Note   CSN: [id number] Arrival date & time: 11/30/23  1451     History {Add pertinent medical, surgical, social history, OB history to HPI:1} Chief Complaint  Patient presents with   Abdominal Pain    Sonia Allen is a 38 y.o. female.  Patient complains of lower abdominal discomfort and irregular menses   Abdominal Pain      Home Medications Prior to Admission medications   Medication Sig Start Date End Date Taking? Authorizing Provider  ibuprofen  (ADVIL ) 800 MG tablet Take 1 tablet (800 mg total) by mouth every 8 (eight) hours as needed. 11/30/23  Yes Emmry Hinsch, MD  ondansetron  (ZOFRAN -ODT) 4 MG disintegrating tablet Take 1 tablet (4 mg total) by mouth every 8 (eight) hours as needed for nausea or vomiting. 10/03/23   White, Elizabeth A, PA-C  traMADol  (ULTRAM ) 50 MG tablet Take 1 tablet (50 mg total) by mouth every 6 (six) hours as needed. 10/03/23   Kreg Pesa, PA-C      Allergies    Latex    Review of Systems   Review of Systems  Gastrointestinal:  Positive for abdominal pain.    Physical Exam Updated Vital Signs BP 99/61   Pulse 60   Temp 97.9 F (36.6 C) (Oral)   Resp 18   SpO2 100%  Physical Exam  ED Results / Procedures / Treatments   Labs (all labs ordered are listed, but only abnormal results are displayed) Labs Reviewed  COMPREHENSIVE METABOLIC PANEL WITH GFR - Abnormal; Notable for the following components:      Result Value   AST 13 (*)    All other components within normal limits  CBC - Abnormal; Notable for the following components:   Hemoglobin 10.8 (*)    HCT 34.8 (*)    All other components within normal limits  LIPASE, BLOOD  HCG, SERUM, QUALITATIVE  URINALYSIS, ROUTINE W REFLEX MICROSCOPIC    EKG None  Radiology US  Pelvis Complete Result Date: 11/30/2023 CLINICAL DATA:  Pain and bleeding. EXAM: TRANSABDOMINAL ULTRASOUND OF PELVIS TECHNIQUE:  Transabdominal ultrasound examination of the pelvis was performed including evaluation of the uterus, ovaries, adnexal regions, and pelvic cul-de-sac. COMPARISON:  CT abdomen and pelvis 11/08/2022. FINDINGS: Uterus Measurements: 11.6 x 6.8 x 8.3 cm = volume: 342 mL. Multiple uterine fibroids are identified. Posterior right fibroid measures 2.7 x 2.7 x 2.3 cm. Fundal fibroid measures 2.1 x 1.7 x 1.7 cm. Anterior left fibroid measures 8 x 5 x 7 mm. These are intramural. Myometrium is diffusely heterogeneous. Endometrium Thickness: 11 mm.  No focal abnormality visualized. Right ovary: Measures 2.7 x 1.6 x 2.2 cm, volume 6.1 mm.  No focal lesion. Left ovary: Measures 3.3 x 1.6 x 2.3 cm, volume 6.1 mL.  No focal lesion. Other: No pelvic free fluid. IMPRESSION: 1. Multiple uterine fibroids. 2. Diffusely heterogeneous myometrium may be due to adenomyosis. 3. Endometrium is 11 mm. 4. Ovaries are unremarkable. Electronically Signed   By: Tyron Gallon M.D.   On: 11/30/2023 18:43    Procedures Procedures  {Document cardiac monitor, telemetry assessment procedure when appropriate:1}  Medications Ordered in ED Medications  ketorolac  (TORADOL ) 30 MG/ML injection 30 mg (30 mg Intravenous Given 11/30/23 1528)    ED Course/ Medical Decision Making/ A&P   {   Click here for ABCD2, HEART and other calculatorsREFRESH Note before signing :1}  Medical Decision Making Amount and/or Complexity of Data Reviewed Labs: ordered. Radiology: ordered.  Risk Prescription drug management.   Patient with uterine fibroids.  She will follow-up with GYN and has been given pain medicine  {Document critical care time when appropriate:1} {Document review of labs and clinical decision tools ie heart score, Chads2Vasc2 etc:1}  {Document your independent review of radiology images, and any outside records:1} {Document your discussion with family members, caretakers, and with consultants:1} {Document  social determinants of health affecting pt's care:1} {Document your decision making why or why not admission, treatments were needed:1} Final Clinical Impression(s) / ED Diagnoses Final diagnoses:  Fibroids    Rx / DC Orders ED Discharge Orders          Ordered    ibuprofen  (ADVIL ) 800 MG tablet  Every 8 hours PRN        11/30/23 1918

## 2023-11-30 NOTE — ED Triage Notes (Signed)
 Pt has c/o lower abdominal pain ongoing for 2 weeks with some nausea. Pt states she has fibroids, and her last cycle only lasted 2 days with more clots than usual.

## 2023-12-01 LAB — GC/CHLAMYDIA PROBE AMP (~~LOC~~) NOT AT ARMC
Chlamydia: NEGATIVE
Comment: NEGATIVE
Comment: NORMAL
Neisseria Gonorrhea: NEGATIVE

## 2024-03-03 ENCOUNTER — Ambulatory Visit
Admission: EM | Admit: 2024-03-03 | Discharge: 2024-03-03 | Disposition: A | Discharge status: Home / Self Care | Attending: Family Medicine | Admitting: Family Medicine

## 2024-03-03 DIAGNOSIS — J988 Other specified respiratory disorders: Secondary | ICD-10-CM | POA: Diagnosis not present

## 2024-03-03 DIAGNOSIS — B9789 Other viral agents as the cause of diseases classified elsewhere: Secondary | ICD-10-CM

## 2024-03-03 DIAGNOSIS — R509 Fever, unspecified: Secondary | ICD-10-CM | POA: Diagnosis not present

## 2024-03-03 LAB — POC SOFIA SARS ANTIGEN FIA: SARS Coronavirus 2 Ag: NEGATIVE

## 2024-03-03 MED ORDER — PSEUDOEPHEDRINE HCL 30 MG PO TABS: 30.0000 mg | ORAL_TABLET | Freq: Three times a day (TID) | ORAL | 0 refills | Status: DC | PRN

## 2024-03-03 MED ORDER — PROMETHAZINE-DM 6.25-15 MG/5ML PO SYRP
5.0000 mL | ORAL_SOLUTION | Freq: Three times a day (TID) | ORAL | 0 refills | Status: DC | PRN
Start: 2024-03-03 — End: 2024-03-29

## 2024-03-03 MED ORDER — IBUPROFEN 800 MG PO TABS
800.0000 mg | ORAL_TABLET | Freq: Once | ORAL | Status: AC
  Administered 2024-03-03: 800 mg via ORAL

## 2024-03-03 MED ORDER — IBUPROFEN 800 MG PO TABS: 800.0000 mg | ORAL_TABLET | Freq: Three times a day (TID) | ORAL | 0 refills | Status: AC

## 2024-03-03 MED ORDER — CETIRIZINE HCL 10 MG PO TABS: 10.0000 mg | ORAL_TABLET | Freq: Every day | ORAL | 0 refills | Status: DC

## 2024-03-03 NOTE — ED Provider Notes (Signed)
 [facility]  Note:  This document was prepared using Conservation officer, historic buildings and may include unintentional dictation errors.  MRN: [id number] DOB: 01/07/1986  Subjective:   Sonia Allen is a 38 y.o. female presenting for 3-4 day history of throat pain, body aches, runny and stuffy nose, sneezing, slight cough. Denies fever, n/v, abdominal pain, pelvic pain, rashes, dysuria, urinary frequency, hematuria, vaginal discharge, shob, wheezing.  Has a history of asthma. Smokes marijuana a few times a week. Has not needed an inhaler in some time.    Current Facility-Administered Medications:    ibuprofen  (ADVIL ) tablet 800 mg, 800 mg, Oral, Once, Christopher Savannah, PA-C  Current Outpatient Medications:    ibuprofen  (ADVIL ) 800 MG tablet, Take 1 tablet (800 mg total) by mouth every 8 (eight) hours as needed., Disp: 21 tablet, Rfl: 1   ondansetron  (ZOFRAN -ODT) 4 MG disintegrating tablet, Take 1 tablet (4 mg total) by mouth every 8 (eight) hours as needed for nausea or vomiting., Disp: 20 tablet, Rfl: 0   traMADol  (ULTRAM ) 50 MG tablet, Take 1 tablet (50 mg total) by mouth every 6 (six) hours as needed., Disp: 10 tablet, Rfl: 0   Allergies  Allergen Reactions   Latex Rash    Burning sensation    Past Medical History:  Diagnosis Date   Anemia    first and sec pregnancies   Arrhythmia    Asymptomatic bacteriuria during pregnancy in first trimester 07/09/2019   E.coli and enteroccus faecalis in urine at NOB; RX for keflex  sent.    Chlamydia    Gestational hypertension 12/17/2017   intrapartum   Gonorrhea    Headache(784.0)    Hemorrhoids    Hx of migraine headaches    Leiomyoma of uterus in pregnancy    Marijuana use 06/16/2017   Positive screen on initial OB     Past Surgical History:  Procedure Laterality Date   INDUCED ABORTION  March  2 ,2013   TUBAL LIGATION Bilateral 02/15/2021   Procedure: POST PARTUM TUBAL LIGATION;  Surgeon: Eldonna Suzen Octave,  MD;  Location: [facility] LD [facility]S;  Service: Gynecology;  Laterality: Bilateral;   WISDOM TOOTH EXTRACTION      Family History  Problem Relation Age of Onset   Healthy Mother    Healthy Father    Other Neg Hx    Alcohol abuse Neg Hx    Arthritis Neg Hx    Asthma Neg Hx    Birth defects Neg Hx    Cancer Neg Hx    COPD Neg Hx    Depression Neg Hx    Diabetes Neg Hx    Drug abuse Neg Hx    Early death Neg Hx    Heart disease Neg Hx    Hearing loss Neg Hx    Hyperlipidemia Neg Hx    Hypertension Neg Hx    Kidney disease Neg Hx    Learning disabilities Neg Hx    Mental illness Neg Hx    Mental retardation Neg Hx    Miscarriages / Stillbirths Neg Hx    Stroke Neg Hx    Vision loss Neg Hx     Social History   Tobacco Use   Smoking status: Never   Smokeless tobacco: Never  Vaping Use   Vaping status: Never Used  Substance Use Topics   Alcohol use: Not Currently    Comment: Pt reports socially   Drug use: Not Currently    Frequency: 14.0 times per week  Types: Marijuana    Comment: Reports not currently smoking    ROS   Objective:   Vitals: BP 123/83 (BP Location: Left Arm)   Pulse 90   Temp (!) 100.6 F (38.1 C) (Oral)   Resp 16   LMP  (Within Weeks) Comment: 1-2 weeks  SpO2 97%   Physical Exam Constitutional:      General: She is not in acute distress.    Appearance: Normal appearance. She is well-developed and normal weight. She is not ill-appearing, toxic-appearing or diaphoretic.  HENT:     Head: Normocephalic and atraumatic.     Right Ear: Tympanic membrane, ear canal and external ear normal. No drainage or tenderness. No middle ear effusion. There is no impacted cerumen. Tympanic membrane is not erythematous or bulging.     Left Ear: Tympanic membrane, ear canal and external ear normal. No drainage or tenderness.  No middle ear effusion. There is no impacted cerumen. Tympanic membrane is not erythematous or bulging.     Nose: Nose normal. No congestion or  rhinorrhea.     Mouth/Throat:     Mouth: Mucous membranes are moist. No oral lesions.     Pharynx: No pharyngeal swelling, oropharyngeal exudate, posterior oropharyngeal erythema or uvula swelling.     Tonsils: No tonsillar exudate or tonsillar abscesses.  Eyes:     General: No scleral icterus.       Right eye: No discharge.        Left eye: No discharge.     Extraocular Movements: Extraocular movements intact.     Right eye: Normal extraocular motion.     Left eye: Normal extraocular motion.     Conjunctiva/sclera: Conjunctivae normal.  Cardiovascular:     Rate and Rhythm: Normal rate and regular rhythm.     Heart sounds: Normal heart sounds. No murmur heard.    No friction rub. No gallop.  Pulmonary:     Effort: Pulmonary effort is normal. No respiratory distress.     Breath sounds: No stridor. No wheezing, rhonchi or rales.  Chest:     Chest wall: No tenderness.  Musculoskeletal:     Cervical back: Normal range of motion and neck supple.  Lymphadenopathy:     Cervical: No cervical adenopathy.  Skin:    General: Skin is warm and dry.  Neurological:     General: No focal deficit present.     Mental Status: She is alert and oriented to person, place, and time.  Psychiatric:        Mood and Affect: Mood normal.        Behavior: Behavior normal.     Results for orders placed or performed during the hospital encounter of 03/03/24 (from the past 24 hours)  POC SARS Coronavirus 2 Ag     Status: Normal   Collection Time: 03/03/24  3:48 PM  Result Value Ref Range   SARS Coronavirus 2 Ag Negative Negative    Assessment and Plan :   PDMP not reviewed this encounter.  1. Viral respiratory infection   2. Fever, unspecified    Deferred imaging given clear cardiopulmonary exam, hemodynamically stable vital signs.  Patient given ibuprofen  in clinic for her fever.  Suspect viral URI, viral syndrome. Physical exam findings reassuring and vital signs stable for discharge. Advised  supportive care, offered symptomatic relief. Counseled patient on potential for adverse effects with medications prescribed/recommended today, ER and return-to-clinic precautions discussed, patient verbalized understanding.     Christopher Savannah, [place] 03/03/24 [phone]

## 2024-03-03 NOTE — Discharge Instructions (Signed)
 We will manage this as a viral respiratory illness. For sore throat or cough try using a honey-based tea. Use 3 teaspoons of honey with juice squeezed from half lemon. Place shaved pieces of ginger into 1/2-1 cup of water and warm over stove top. Then mix the ingredients and repeat every 4 hours as needed. Please take ibuprofen  800mg  every 8 hours with food alternating with OR taken together with Tylenol  500mg -650mg  every 6 hours for throat pain, fevers, aches and pains. Hydrate very well with at least 2 liters of water. Eat light meals such as soups (chicken and noodles, vegetable, chicken and wild rice).  Do not eat foods that you are allergic to.  Taking an antihistamine like Zyrtec  (10mg  daily) can help against postnasal drainage, sinus congestion which can cause sinus pain, sinus headaches, throat pain, painful swallowing, coughing.  You can take this together with pseudoephedrine  (Sudafed) at a dose of 30mg  3 times a day or twice daily as needed for the same kind of nasal drip, congestion.  Use cough syrup as needed.

## 2024-03-03 NOTE — ED Triage Notes (Signed)
 Pt reports body aches and headaches x 4 days. Pt has not taken nay meds.

## 2024-03-29 ENCOUNTER — Other Ambulatory Visit: Payer: Self-pay

## 2024-03-29 ENCOUNTER — Ambulatory Visit
Admission: EM | Admit: 2024-03-29 | Discharge: 2024-03-29 | Disposition: A | Discharge status: Home / Self Care | Source: Ambulatory Visit | Attending: Family Medicine | Admitting: Family Medicine

## 2024-03-29 DIAGNOSIS — L299 Pruritus, unspecified: Secondary | ICD-10-CM | POA: Diagnosis not present

## 2024-03-29 MED ORDER — TRIAMCINOLONE ACETONIDE 0.1 % EX CREA: 1.0000 | TOPICAL_CREAM | Freq: Two times a day (BID) | CUTANEOUS | 0 refills | Status: AC | PRN

## 2024-03-29 MED ORDER — CETIRIZINE HCL 10 MG PO TABS: 10.0000 mg | ORAL_TABLET | Freq: Every day | ORAL | 0 refills | Status: DC

## 2024-03-29 NOTE — ED Triage Notes (Signed)
 Pt c/o itching all over body and it gets worse at nightx1wk. Pt states when I scratch the area, red rashes appear there.

## 2024-03-29 NOTE — Discharge Instructions (Addendum)
 Start cetirizine  daily for the next 1 to 2 weeks.  May use topical triamcinolone steroid cream topically to any rash areas that occur.  Please follow-up with your PCP if your symptoms do not improve.  Please go to the ER for any worsening symptoms.  Hope you feel better soon!

## 2024-03-29 NOTE — ED Provider Notes (Signed)
 [facility]    CSN: [id number] Arrival date & time: 03/29/24  1909      History   Chief Complaint No chief complaint on file.   HPI Sonia Allen is a 38 y.o. female presents for itching/rash.  Patient reports over the past week she will develop itching in certain areas of her body and when she starts scratching then she develops a rash.  The rash usually resolves on its own and migrates based on where she is scratching.  Denies any fevers chills.  No new contacts including soaps, medications, detergents, excetra.  Has a history of eczema as a child but states this is not typical eczema symptoms for her.  She has not used any OTC treatments for symptoms.  No other concerns at this time  HPI  Past Medical History:  Diagnosis Date   Anemia    first and sec pregnancies   Arrhythmia    Asymptomatic bacteriuria during pregnancy in first trimester 07/09/2019   E.coli and enteroccus faecalis in urine at NOB; RX for keflex  sent.    Chlamydia    Gestational hypertension 12/17/2017   intrapartum   Gonorrhea    Headache(784.0)    Hemorrhoids    Hx of migraine headaches    Leiomyoma of uterus in pregnancy    Marijuana use 06/16/2017   Positive screen on initial OB    Patient Active Problem List   Diagnosis Date Noted   Status post bilateral salpingectomy 02/15/2021   Grand multiparity 02/14/2021   Fibroids 06/18/2019    Past Surgical History:  Procedure Laterality Date   INDUCED ABORTION  March  2 ,2013   TUBAL LIGATION Bilateral 02/15/2021   Procedure: POST PARTUM TUBAL LIGATION;  Surgeon: Eldonna Suzen Octave, MD;  Location: [facility];  Service: Gynecology;  Laterality: Bilateral;   WISDOM TOOTH EXTRACTION      OB History     Gravida  10   Para  7   Term  7   Preterm  0   AB  3   Living  7      SAB  1   IAB  2   Ectopic  0   Multiple  0   Live Births  7            Home Medications    Prior to Admission medications   Medication Sig  Start Date End Date Taking? Authorizing Provider  cetirizine  (ZYRTEC ) 10 MG tablet Take 1 tablet (10 mg total) by mouth daily. 03/29/24  Yes Avonell Lenig, Jodi R, NP  triamcinolone cream (KENALOG) 0.1 % Apply 1 Application topically 2 (two) times daily as needed (rash). 03/29/24  Yes Rondrick Barreira, Jodi R, NP  ibuprofen  (ADVIL ) 800 MG tablet Take 1 tablet (800 mg total) by mouth 3 (three) times daily. 03/03/24   Christopher Savannah, PA-C  traMADol  (ULTRAM ) 50 MG tablet Take 1 tablet (50 mg total) by mouth every 6 (six) hours as needed. 10/03/23   Teresa Almarie LABOR, PA-C    Family History Family History  Problem Relation Age of Onset   Healthy Mother    Healthy Father    Other Neg Hx    Alcohol abuse Neg Hx    Arthritis Neg Hx    Asthma Neg Hx    Birth defects Neg Hx    Cancer Neg Hx    COPD Neg Hx    Depression Neg Hx    Diabetes Neg Hx    Drug abuse Neg Hx  Early death Neg Hx    Heart disease Neg Hx    Hearing loss Neg Hx    Hyperlipidemia Neg Hx    Hypertension Neg Hx    Kidney disease Neg Hx    Learning disabilities Neg Hx    Mental illness Neg Hx    Mental retardation Neg Hx    Miscarriages / Stillbirths Neg Hx    Stroke Neg Hx    Vision loss Neg Hx     Social History Social History   Tobacco Use   Smoking status: Never   Smokeless tobacco: Never  Vaping Use   Vaping status: Never Used  Substance Use Topics   Alcohol use: Not Currently    Comment: Pt reports socially   Drug use: Not Currently    Frequency: 14.0 times per week    Types: Marijuana    Comment: Reports not currently smoking     Allergies   Latex   Review of Systems Review of Systems  Skin:  Positive for rash.     Physical Exam Triage Vital Signs ED Triage Vitals  Encounter Vitals Group     BP 03/29/24 1920 117/78     Girls Systolic BP Percentile --      Girls Diastolic BP Percentile --      Boys Systolic BP Percentile --      Boys Diastolic BP Percentile --      Pulse Rate 03/29/24 1920 72     Resp  03/29/24 1920 16     Temp 03/29/24 1920 98.3 F (36.8 C)     Temp Source 03/29/24 1920 Oral     SpO2 03/29/24 1920 97 %     Weight --      Height --      Head Circumference --      Peak Flow --      Pain Score 03/29/24 1917 5     Pain Loc --      Pain Education --      Exclude from Growth Chart --    No data found.  Updated Vital Signs BP 117/78   Pulse 72   Temp 98.3 F (36.8 C) (Oral)   Resp 16   LMP 03/08/2024 (Within Weeks)   SpO2 97%   Visual Acuity Right Eye Distance:   Left Eye Distance:   Bilateral Distance:    Right Eye Near:   Left Eye Near:    Bilateral Near:     Physical Exam Vitals and nursing note reviewed.  Constitutional:      General: She is not in acute distress.    Appearance: Normal appearance. She is not ill-appearing.  HENT:     Head: Normocephalic and atraumatic.  Eyes:     Pupils: Pupils are equal, round, and reactive to light.  Cardiovascular:     Rate and Rhythm: Normal rate.  Pulmonary:     Effort: Pulmonary effort is normal.  Skin:    General: Skin is warm and dry.     Comments: There are no visible rashes on exam  Neurological:     General: No focal deficit present.     Mental Status: She is alert and oriented to person, place, and time.  Psychiatric:        Mood and Affect: Mood normal.        Behavior: Behavior normal.      UC Treatments / Results  Labs (all labs ordered are listed, but only abnormal results are displayed) Labs  Reviewed - No data to display  EKG   Radiology No results found.  Procedures Procedures (including critical care time)  Medications Ordered in UC Medications - No data to display  Initial Impression / Assessment and Plan / UC Course  I have reviewed the triage vital signs and the nursing notes.  Pertinent labs & imaging results that were available during my care of the patient were reviewed by me and considered in my medical decision making (see chart for details).     Reviewed  exam and symptoms with patient.  No visible rashes.  Patient reports she feels itchy and then the rash appears after scratching.  Will do trial of cetirizine  for 1 to 2 weeks and will provide  topical triamcinolone as needed for the rash areas.  Advise follow-up with PCP if symptoms do not improve.  ER precautions reviewed Final Clinical Impressions(s) / UC Diagnoses   Final diagnoses:  Pruritus     Discharge Instructions      Start cetirizine  daily for the next 1 to 2 weeks.  May use topical triamcinolone steroid cream topically to any rash areas that occur.  Please follow-up with your PCP if your symptoms do not improve.  Please go to the ER for any worsening symptoms.  Hope you feel better soon!    ED Prescriptions     Medication Sig Dispense Auth. Provider   cetirizine  (ZYRTEC ) 10 MG tablet Take 1 tablet (10 mg total) by mouth daily. 30 tablet Payden Docter, Jodi R, NP   triamcinolone cream (KENALOG) 0.1 % Apply 1 Application topically 2 (two) times daily as needed (rash). 45 g Elic Vencill, Jodi R, NP      PDMP not reviewed this encounter.   Loreda Myla SAUNDERS, NP 03/29/24 [phone]

## 2024-06-30 ENCOUNTER — Emergency Department (HOSPITAL_BASED_OUTPATIENT_CLINIC_OR_DEPARTMENT_OTHER)
Admission: EM | Admit: 2024-06-30 | Discharge: 2024-06-30 | Disposition: A | Discharge status: Home / Self Care | Attending: Emergency Medicine | Admitting: Emergency Medicine

## 2024-06-30 ENCOUNTER — Encounter (HOSPITAL_BASED_OUTPATIENT_CLINIC_OR_DEPARTMENT_OTHER): Payer: Self-pay

## 2024-06-30 ENCOUNTER — Other Ambulatory Visit: Payer: Self-pay

## 2024-06-30 DIAGNOSIS — M7918 Myalgia, other site: Secondary | ICD-10-CM | POA: Insufficient documentation

## 2024-06-30 DIAGNOSIS — R109 Unspecified abdominal pain: Secondary | ICD-10-CM | POA: Insufficient documentation

## 2024-06-30 DIAGNOSIS — J069 Acute upper respiratory infection, unspecified: Secondary | ICD-10-CM

## 2024-06-30 LAB — CBC
HCT: 31 % — ABNORMAL LOW (ref 36.0–46.0)
Hemoglobin: 10 g/dL — ABNORMAL LOW (ref 12.0–15.0)
MCH: 27.5 pg (ref 26.0–34.0)
MCHC: 32.3 g/dL (ref 30.0–36.0)
MCV: 85.4 fL (ref 80.0–100.0)
Platelets: 352 K/uL (ref 150–400)
RBC: 3.63 MIL/uL — ABNORMAL LOW (ref 3.87–5.11)
RDW: 15 % (ref 11.5–15.5)
WBC: 9.2 K/uL (ref 4.0–10.5)
nRBC: 0 % (ref 0.0–0.2)

## 2024-06-30 LAB — LIPASE, BLOOD: Lipase: 32 U/L (ref 11–51)

## 2024-06-30 LAB — COMPREHENSIVE METABOLIC PANEL WITH GFR
ALT: 9 U/L (ref 0–44)
AST: 12 U/L — ABNORMAL LOW (ref 15–41)
Albumin: 4.2 g/dL (ref 3.5–5.0)
Alkaline Phosphatase: 64 U/L (ref 38–126)
Anion gap: 10 (ref 5–15)
BUN: 12 mg/dL (ref 6–20)
CO2: 23 mmol/L (ref 22–32)
Calcium: 9.1 mg/dL (ref 8.9–10.3)
Chloride: 106 mmol/L (ref 98–111)
Creatinine, Ser: 0.61 mg/dL (ref 0.44–1.00)
GFR, Estimated: >60 mL/min
Glucose, Bld: 107 mg/dL — ABNORMAL HIGH (ref 70–99)
Potassium: 4 mmol/L (ref 3.5–5.1)
Sodium: 138 mmol/L (ref 135–145)
Total Bilirubin: 0.3 mg/dL (ref 0.0–1.2)
Total Protein: 7.1 g/dL (ref 6.5–8.1)

## 2024-06-30 LAB — URINALYSIS, ROUTINE W REFLEX MICROSCOPIC
Bilirubin Urine: NEGATIVE
Glucose, UA: NEGATIVE mg/dL
Ketones, ur: NEGATIVE mg/dL
Leukocytes,Ua: NEGATIVE
Nitrite: NEGATIVE
Protein, ur: NEGATIVE mg/dL
Specific Gravity, Urine: 1.025 (ref 1.005–1.030)
pH: 6 (ref 5.0–8.0)

## 2024-06-30 LAB — URINALYSIS, MICROSCOPIC (REFLEX)

## 2024-06-30 LAB — PREGNANCY, URINE: Preg Test, Ur: NEGATIVE

## 2024-06-30 MED ORDER — ACETAMINOPHEN 500 MG PO TABS
1000.0000 mg | ORAL_TABLET | Freq: Once | ORAL | Status: AC
  Administered 2024-06-30: 1000 mg via ORAL
  Filled 2024-06-30: qty 2

## 2024-06-30 NOTE — ED Provider Notes (Signed)
 " [facility] Provider Note   CSN: [id number] Arrival date & time: 06/30/24  1108     Patient presents with: Multiple Complaints    Sonia Allen is a 38 y.o. female.   Patient is a 38 year old female with a history of migraines, anemia who is presenting today with multiple symptoms.  Patient reports that she had URI symptoms approximately 2 weeks ago that had resolved but 2 days ago started having chills, generalized bodyaches, abdominal pain.  She reports recently also developed a cough and has been tired.  She has not had any vomiting but occasionally will feel nauseated.  No change in bowel movements.  Patient reports just recently finished her menses.  She is not having any vaginal discharge or urinary complaints but is having pain on the right side.  For the last few days.  Does report sick contacts at home.  The history is provided by the patient and medical records.       Prior to Admission medications  Medication Sig Start Date End Date Taking? Authorizing Provider  cetirizine  (ZYRTEC ) 10 MG tablet Take 1 tablet (10 mg total) by mouth daily. 03/29/24   Mayer, Jodi R, NP  ibuprofen  (ADVIL ) 800 MG tablet Take 1 tablet (800 mg total) by mouth 3 (three) times daily. 03/03/24   Christopher Savannah, PA-C  traMADol  (ULTRAM ) 50 MG tablet Take 1 tablet (50 mg total) by mouth every 6 (six) hours as needed. 10/03/23   White, Elizabeth A, PA-C  triamcinolone  cream (KENALOG ) 0.1 % Apply 1 Application topically 2 (two) times daily as needed (rash). 03/29/24   Loreda Myla SAUNDERS, NP    Allergies: Latex    Review of Systems  Updated Vital Signs BP 123/75   Pulse 88   Temp 98.3 F (36.8 C) (Oral)   Resp 16   SpO2 100%   Physical Exam Vitals and nursing note reviewed.  Constitutional:      General: She is not in acute distress.    Appearance: She is well-developed.  HENT:     Head: Normocephalic and atraumatic.     Right Ear: Tympanic membrane normal.      Left Ear: Tympanic membrane normal.     Mouth/Throat:     Mouth: Mucous membranes are moist.  Eyes:     Pupils: Pupils are equal, round, and reactive to light.  Cardiovascular:     Rate and Rhythm: Normal rate and regular rhythm.     Heart sounds: Normal heart sounds. No murmur heard.    No friction rub.  Pulmonary:     Effort: Pulmonary effort is normal.     Breath sounds: Normal breath sounds. No wheezing or rales.  Abdominal:     General: Bowel sounds are normal. There is no distension.     Palpations: Abdomen is soft.     Tenderness: There is abdominal tenderness. There is no right CVA tenderness, guarding or rebound.      Comments: Mild right sided pain without rebound or guarding  Musculoskeletal:        General: No tenderness. Normal range of motion.     Comments: No edema  Skin:    General: Skin is warm and dry.     Findings: No rash.  Neurological:     Mental Status: She is alert and oriented to person, place, and time. Mental status is at baseline.     Cranial Nerves: No cranial nerve deficit.  Psychiatric:  Behavior: Behavior normal.     (all labs ordered are listed, but only abnormal results are displayed) Labs Reviewed  COMPREHENSIVE METABOLIC PANEL WITH GFR - Abnormal; Notable for the following components:      Result Value   Glucose, Bld 107 (*)    AST 12 (*)    All other components within normal limits  CBC - Abnormal; Notable for the following components:   RBC 3.63 (*)    Hemoglobin 10.0 (*)    HCT 31.0 (*)    All other components within normal limits  LIPASE, BLOOD  URINALYSIS, ROUTINE W REFLEX MICROSCOPIC  PREGNANCY, URINE    EKG: EKG Interpretation Date/Time:  Wednesday June 30 2024 11:27:21 EST Ventricular Rate:  96 PR Interval:  128 QRS Duration:  80 QT Interval:  331 QTC Calculation: 419 R Axis:   37  Text Interpretation: Sinus rhythm Abnormal R-wave progression, early transition Borderline repolarization abnormality No  significant change since last tracing Confirmed by Doretha Folks ([id number]) on 06/30/2024 1:36:13 PM  Radiology: No results found.   Procedures   Medications Ordered in the ED  acetaminophen  (TYLENOL ) tablet 1,000 mg (has no administration in time range)                                    Medical Decision Making Amount and/or Complexity of Data Reviewed Labs: ordered.  Risk OTC drugs.   Patient presenting today with multiple complaints most consistent with a flulike illness.  She is having some abdominal complaints but has no significant focal abdominal tenderness.  No rebound or guarding.  Denies any urinary or vaginal complaints.  Will ensure patient is not pregnant and does not have evidence of a UTI.  I independently interpreted patient's labs and lipase, CMP and CBC all without acute findings.  Patient given Tylenol  for her symptoms.  Sounds are clear without findings consistent with pneumonia.     Final diagnoses:  None    ED Discharge Orders     None          Doretha Folks, MD 06/30/24 1459  "

## 2024-06-30 NOTE — ED Triage Notes (Signed)
 Reports R flank pain for 2 weeks. Radiates to R side of abd. Also reports vaginal pain for 3 weeks. Denies dysuria, abnormal discharge.  Also reports BIL breast pain, states not sure if she feels it in her chest but has intermittent [facility]

## 2024-06-30 NOTE — ED Provider Notes (Signed)
" °  Physical Exam  BP 108/73 (BP Location: Right Arm)   Pulse 70   Temp 98.3 F (36.8 C) (Oral)   Resp 16   SpO2 100%   Physical Exam  Procedures  Procedures  ED Course / MDM    Medical Decision Making Amount and/or Complexity of Data Reviewed Labs: ordered.  Risk OTC drugs.   Patient signed a pending urinalysis.  Negative for UTI, negative pregnancy.  Discharge, likely viral syndrome.       Mannie Pac T, DO 06/30/24 1751  "

## 2024-06-30 NOTE — ED Notes (Signed)
 Pt educated to please provide urine sample whenever she is able. Pt verbalized understanding.

## 2024-06-30 NOTE — Discharge Instructions (Signed)
 While you are in the emergency room, your blood work that overall was normal for you.  This is likely a viral infection.  You may take Motrin  and Tylenol  at home.  Symptoms should begin to improve over the next few days.

## 2024-06-30 NOTE — ED Notes (Signed)
 Pt tried to give a urine sample and was unable to go will try again later.

## 2024-06-30 NOTE — ED Notes (Signed)
 ED Provider at bedside.

## 2024-07-21 ENCOUNTER — Ambulatory Visit
Admission: EM | Admit: 2024-07-21 | Discharge: 2024-07-21 | Disposition: A | Discharge status: Home / Self Care | Attending: Family Medicine | Admitting: Family Medicine

## 2024-07-21 DIAGNOSIS — B9689 Other specified bacterial agents as the cause of diseases classified elsewhere: Secondary | ICD-10-CM | POA: Diagnosis not present

## 2024-07-21 DIAGNOSIS — J069 Acute upper respiratory infection, unspecified: Secondary | ICD-10-CM

## 2024-07-21 DIAGNOSIS — J101 Influenza due to other identified influenza virus with other respiratory manifestations: Secondary | ICD-10-CM

## 2024-07-21 MED ORDER — PROMETHAZINE-DM 6.25-15 MG/5ML PO SYRP: 5.0000 mL | ORAL_SOLUTION | Freq: Three times a day (TID) | ORAL | 0 refills | Status: AC | PRN

## 2024-07-21 MED ORDER — CETIRIZINE HCL 10 MG PO TABS: 10.0000 mg | ORAL_TABLET | Freq: Every day | ORAL | 0 refills | Status: AC

## 2024-07-21 MED ORDER — AMOXICILLIN 875 MG PO TABS: 875.0000 mg | ORAL_TABLET | Freq: Two times a day (BID) | ORAL | 0 refills | Status: AC

## 2024-07-21 MED ORDER — PSEUDOEPHEDRINE HCL 30 MG PO TABS: 30.0000 mg | ORAL_TABLET | Freq: Three times a day (TID) | ORAL | 0 refills | Status: AC | PRN

## 2024-07-21 NOTE — ED Triage Notes (Signed)
 Pt states that she has some fatigue, sore throat, headache and nasal congestion. Pt states that she also has body aches.  Pt states that she has been sick for 2 months   Pt states that she has been taking Nyquil and Dayquil.

## 2024-07-21 NOTE — Discharge Instructions (Signed)
 We will manage this as a bacterial upper respiratory infection with amoxicillin. For sore throat or cough try using a honey-based tea. Use 3 teaspoons of honey with juice squeezed from half lemon. Place shaved pieces of ginger into 1/2-1 cup of water and warm over stove top. Then mix the ingredients and repeat every 4 hours as needed. Please take ibuprofen 600mg  every 6 hours with food alternating with OR taken together with Tylenol 500mg -650mg  every 6 hours for throat pain, fevers, aches and pains. Hydrate very well with at least 2 liters of water. Eat light meals such as soups (chicken and noodles, vegetable, chicken and wild rice).  Do not eat foods that you are allergic to.  Taking an antihistamine like Zyrtec can help against postnasal drainage, sinus congestion which can cause sinus pain, sinus headaches, throat pain, painful swallowing, coughing.  You can take this together with pseudoephedrine (Sudafed) at a dose of 30 mg 3 times a day or twice daily as needed for the same kind of nasal drip, congestion.  Use cough medication as needed.

## 2024-07-21 NOTE — ED Provider Notes (Signed)
 " [facility]  Note:  This document was prepared using Conservation officer, historic buildings and may include unintentional dictation errors.  MRN: [id number] DOB: 12/14/1985  Subjective:   Sonia Allen is a 39 y.o. female presenting for 84-month history of persistent fatigue, throat pain, sinus congestion and pressure, sinus headaches, body pains.  Patient reports that she was seen through [facility] long a month ago, reports similar symptoms that but feels that her symptoms never really improved or resolved even before or after that visit.  No chest pain, shortness of breath or wheezing.  No smoking of any kind including cigarettes, cigars, vaping, marijuana use.  No asthma.  Presents with her daughter who tested positive for influenza B in our clinic today.  However, patient reports that she does not feel any particular difference in her symptoms, just continues to feel sick.  Current Outpatient Medications  Medication Instructions   cetirizine  (ZYRTEC ) 10 mg, Oral, Daily   ibuprofen  (ADVIL ) 800 mg, Oral, 3 times daily   traMADol  (ULTRAM ) 50 mg, Oral, Every 6 hours PRN   triamcinolone  cream (KENALOG ) 0.1 % 1 Application, Topical, 2 times daily PRN    Allergies[1]  Past Medical History:  Diagnosis Date   Anemia    first and sec pregnancies   Arrhythmia    Asymptomatic bacteriuria during pregnancy in first trimester 07/09/2019   E.coli and enteroccus faecalis in urine at [facility]; RX for keflex  sent.    Chlamydia    Gestational hypertension 12/17/2017   intrapartum   Gonorrhea    Headache(784.0)    Hemorrhoids    Hx of migraine headaches    Leiomyoma of uterus in pregnancy    Marijuana use 06/16/2017   Positive screen on initial OB     Past Surgical History:  Procedure Laterality Date   INDUCED ABORTION  March  2 ,2013   TUBAL LIGATION Bilateral 02/15/2021   Procedure: POST PARTUM TUBAL LIGATION;  Surgeon: Eldonna Suzen Octave, MD;  Location: [facility];  Service:  Gynecology;  Laterality: Bilateral;   WISDOM TOOTH EXTRACTION      Family History  Problem Relation Age of Onset   Healthy Mother    Healthy Father    Other Neg Hx    Alcohol abuse Neg Hx    Arthritis Neg Hx    Asthma Neg Hx    Birth defects Neg Hx    Cancer Neg Hx    COPD Neg Hx    Depression Neg Hx    Diabetes Neg Hx    Drug abuse Neg Hx    Early death Neg Hx    Heart disease Neg Hx    Hearing loss Neg Hx    Hyperlipidemia Neg Hx    Hypertension Neg Hx    Kidney disease Neg Hx    Learning disabilities Neg Hx    Mental illness Neg Hx    Mental retardation Neg Hx    Miscarriages / Stillbirths Neg Hx    Stroke Neg Hx    Vision loss Neg Hx     Social History   Occupational History   Not on file  Tobacco Use   Smoking status: Never   Smokeless tobacco: Never  Vaping Use   Vaping status: Never Used  Substance and Sexual Activity   Alcohol use: Not Currently    Comment: Pt reports socially   Drug use: Not Currently    Frequency: 14.0 times per week    Types: Marijuana  Comment: Reports not currently smoking   Sexual activity: Yes    Birth control/protection: None     ROS   Objective:   Vitals: BP 125/77 (BP Location: Left Arm)   Pulse 86   Temp 98 F (36.7 C) (Oral)   Resp 18   Ht 5' 4 (1.626 m)   Wt 160 lb (72.6 kg)   LMP 07/21/2024   SpO2 98%   BMI 27.46 kg/m   Physical Exam Constitutional:      General: She is not in acute distress.    Appearance: Normal appearance. She is well-developed and normal weight. She is not ill-appearing, toxic-appearing or diaphoretic.  HENT:     Head: Normocephalic and atraumatic.     Right Ear: Tympanic membrane, ear canal and external ear normal. No drainage or tenderness. No middle ear effusion. There is no impacted cerumen. Tympanic membrane is not erythematous or bulging.     Left Ear: Tympanic membrane, ear canal and external ear normal. No drainage or tenderness.  No middle ear effusion. There is no  impacted cerumen. Tympanic membrane is not erythematous or bulging.     Nose: Congestion present. No rhinorrhea.     Mouth/Throat:     Mouth: Mucous membranes are moist. No oral lesions.     Pharynx: No pharyngeal swelling, oropharyngeal exudate, posterior oropharyngeal erythema or uvula swelling.     Tonsils: No tonsillar exudate or tonsillar abscesses. 0 on the right. 0 on the left.  Eyes:     General: No scleral icterus.       Right eye: No discharge.        Left eye: No discharge.     Extraocular Movements: Extraocular movements intact.     Right eye: Normal extraocular motion.     Left eye: Normal extraocular motion.     Conjunctiva/sclera: Conjunctivae normal.  Cardiovascular:     Rate and Rhythm: Normal rate and regular rhythm.     Heart sounds: Normal heart sounds. No murmur heard.    No friction rub. No gallop.  Pulmonary:     Effort: Pulmonary effort is normal. No respiratory distress.     Breath sounds: No stridor. No wheezing, rhonchi or rales.  Chest:     Chest wall: No tenderness.  Musculoskeletal:     Cervical back: Normal range of motion and neck supple.  Lymphadenopathy:     Cervical: No cervical adenopathy.  Skin:    General: Skin is warm and dry.  Neurological:     General: No focal deficit present.     Mental Status: She is alert and oriented to person, place, and time.  Psychiatric:        Mood and Affect: Mood normal.        Behavior: Behavior normal.     Assessment and Plan :   PDMP not reviewed this encounter.  1. Bacterial upper respiratory infection   2. Influenza B      Start amoxicillin  for bacterial upper respiratory infection use supportive care.  Patient has presumptive diagnosis of influenza B as well given her close exposure to her daughter. Counseled patient on potential for adverse effects with medications prescribed/recommended today, ER and return-to-clinic precautions discussed, patient verbalized understanding.     [1]   Allergies Allergen Reactions   Latex Rash    Burning sensation     Christopher Savannah, PA-C 07/21/24 1934  "

## 2024-07-21 NOTE — ED Triage Notes (Signed)
 Pt states that she also has had a loss of appetite.

## 2024-07-30 ENCOUNTER — Emergency Department (HOSPITAL_BASED_OUTPATIENT_CLINIC_OR_DEPARTMENT_OTHER)

## 2024-07-30 ENCOUNTER — Encounter (HOSPITAL_BASED_OUTPATIENT_CLINIC_OR_DEPARTMENT_OTHER): Payer: Self-pay | Admitting: Emergency Medicine

## 2024-07-30 ENCOUNTER — Emergency Department (HOSPITAL_BASED_OUTPATIENT_CLINIC_OR_DEPARTMENT_OTHER)
Admission: EM | Admit: 2024-07-30 | Discharge: 2024-07-30 | Disposition: A | Discharge status: Home / Self Care | Attending: Emergency Medicine | Admitting: Emergency Medicine

## 2024-07-30 ENCOUNTER — Other Ambulatory Visit: Payer: Self-pay

## 2024-07-30 DIAGNOSIS — R519 Headache, unspecified: Secondary | ICD-10-CM | POA: Diagnosis present

## 2024-07-30 DIAGNOSIS — R1084 Generalized abdominal pain: Secondary | ICD-10-CM | POA: Insufficient documentation

## 2024-07-30 DIAGNOSIS — G43809 Other migraine, not intractable, without status migrainosus: Secondary | ICD-10-CM | POA: Insufficient documentation

## 2024-07-30 LAB — URINALYSIS, W/ REFLEX TO CULTURE (INFECTION SUSPECTED)
Glucose, UA: NEGATIVE mg/dL
Ketones, ur: 15 mg/dL — AB
Leukocytes,Ua: NEGATIVE
Nitrite: NEGATIVE
Protein, ur: 100 mg/dL — AB
Specific Gravity, Urine: >=1.03 (ref 1.005–1.030)
pH: 5.5 (ref 5.0–8.0)

## 2024-07-30 LAB — CBC WITH DIFFERENTIAL/PLATELET
Abs Immature Granulocytes: 0.02 10*3/uL (ref 0.00–0.07)
Basophils Absolute: 0 10*3/uL (ref 0.0–0.1)
Basophils Relative: 0 %
Eosinophils Absolute: 0.1 10*3/uL (ref 0.0–0.5)
Eosinophils Relative: 1 %
HCT: 34.9 % — ABNORMAL LOW (ref 36.0–46.0)
Hemoglobin: 11.1 g/dL — ABNORMAL LOW (ref 12.0–15.0)
Immature Granulocytes: 0 %
Lymphocytes Relative: 30 %
Lymphs Abs: 2.1 10*3/uL (ref 0.7–4.0)
MCH: 26.8 pg (ref 26.0–34.0)
MCHC: 31.8 g/dL (ref 30.0–36.0)
MCV: 84.3 fL (ref 80.0–100.0)
Monocytes Absolute: 0.4 10*3/uL (ref 0.1–1.0)
Monocytes Relative: 5 %
Neutro Abs: 4.5 10*3/uL (ref 1.7–7.7)
Neutrophils Relative %: 64 %
Platelets: 444 10*3/uL — ABNORMAL HIGH (ref 150–400)
RBC: 4.14 MIL/uL (ref 3.87–5.11)
RDW: 14.5 % (ref 11.5–15.5)
WBC: 7.1 10*3/uL (ref 4.0–10.5)
nRBC: 0 % (ref 0.0–0.2)

## 2024-07-30 LAB — COMPREHENSIVE METABOLIC PANEL WITH GFR
ALT: 9 U/L (ref 0–44)
AST: 13 U/L — ABNORMAL LOW (ref 15–41)
Albumin: 4.5 g/dL (ref 3.5–5.0)
Alkaline Phosphatase: 57 U/L (ref 38–126)
Anion gap: 10 (ref 5–15)
BUN: 11 mg/dL (ref 6–20)
CO2: 23 mmol/L (ref 22–32)
Calcium: 9.4 mg/dL (ref 8.9–10.3)
Chloride: 103 mmol/L (ref 98–111)
Creatinine, Ser: 0.72 mg/dL (ref 0.44–1.00)
GFR, Estimated: >60 mL/min
Glucose, Bld: 167 mg/dL — ABNORMAL HIGH (ref 70–99)
Potassium: 3.9 mmol/L (ref 3.5–5.1)
Sodium: 136 mmol/L (ref 135–145)
Total Bilirubin: 0.7 mg/dL (ref 0.0–1.2)
Total Protein: 7.5 g/dL (ref 6.5–8.1)

## 2024-07-30 LAB — LACTIC ACID, PLASMA: Lactic Acid, Venous: 0.9 mmol/L (ref 0.5–1.9)

## 2024-07-30 LAB — PREGNANCY, URINE: Preg Test, Ur: NEGATIVE

## 2024-07-30 MED ORDER — KETOROLAC TROMETHAMINE 15 MG/ML IJ SOLN
15.0000 mg | Freq: Once | INTRAMUSCULAR | Status: AC
  Administered 2024-07-30: 15 mg via INTRAVENOUS
  Filled 2024-07-30: qty 1

## 2024-07-30 MED ORDER — LACTATED RINGERS IV BOLUS
1000.0000 mL | Freq: Once | INTRAVENOUS | Status: AC
  Administered 2024-07-30: 1000 mL via INTRAVENOUS

## 2024-07-30 MED ORDER — IOHEXOL 300 MG/ML  SOLN
80.0000 mL | Freq: Once | INTRAMUSCULAR | Status: AC | PRN
  Administered 2024-07-30: 80 mL via INTRAVENOUS

## 2024-07-30 MED ORDER — PROCHLORPERAZINE EDISYLATE 10 MG/2ML IJ SOLN
10.0000 mg | Freq: Once | INTRAMUSCULAR | Status: AC
  Administered 2024-07-30: 10 mg via INTRAVENOUS
  Filled 2024-07-30: qty 2

## 2024-07-30 MED ORDER — ONDANSETRON 4 MG PO TBDP: 4.0000 mg | ORAL_TABLET | Freq: Three times a day (TID) | ORAL | 0 refills | Status: AC | PRN

## 2024-07-30 MED ORDER — DIPHENHYDRAMINE HCL 50 MG/ML IJ SOLN
25.0000 mg | Freq: Once | INTRAMUSCULAR | Status: AC
  Administered 2024-07-30: 25 mg via INTRAVENOUS
  Filled 2024-07-30: qty 1

## 2024-07-30 NOTE — Discharge Instructions (Signed)
 It was a pleasure taking care of you today. You were seen in the Emergency Department for evaluation of headache and abdominal pain. Your work-up was reassuring. Your CT/Xray/Labs showed no acute abnormality to explain your symptoms.  No evidence of infection.  I suspect your symptoms are likely secondary to both a migraine and possible viral infection.  As we discussed, antibiotics are not indicated.  You may take Tylenol  and/or ibuprofen  as needed for your symptoms.  I recommend getting plenty of rest and staying hydrated. Refer to the attached documentation for further management of your symptoms. Follow up with your PCP if your symptoms continue.  Please return to the ER if you experience chest pain, trouble breathing, intractable nausea/vomiting or any other life threatening illnesses.

## 2024-07-30 NOTE — ED Triage Notes (Signed)
 Pt c/o ongoing malaise x 2 weeks, decreased appetite, back and abd pain x 1 week.

## 2024-07-30 NOTE — ED Provider Notes (Signed)
 " [place] [facility] AT [facility] Provider Note   CSN: [id number] Arrival date & time: 07/30/24  1406     Patient presents with: Abdominal Pain and Fatigue   Sonia Allen is a 39 y.o. female with no pertinent past medical history, who presents to the emergency department for evaluation of fatigue, abdominal pain and headache that has been ongoing for the last 6 days.  Patient does report her headache feels like a migraine, which she has had in the past.  She is reporting photophobia as well as nausea.  No vomiting today, but she does report emesis secondary to the headache yesterday.  Patient states after she vomited, she did feel better.  Additionally, she is reporting generalized abdominal pain, worse when laying down.  Pain is unchanged whether she eats or not.  She denies any dysuria or hematuria, but does endorse some back and left flank pain.   Abdominal Pain      Prior to Admission medications  Medication Sig Start Date End Date Taking? Authorizing Provider  ondansetron  (ZOFRAN -ODT) 4 MG disintegrating tablet Take 1 tablet (4 mg total) by mouth every 8 (eight) hours as needed for nausea or vomiting. 07/30/24  Yes San Lohmeyer, Marry RAMAN, PA-C  amoxicillin  (AMOXIL ) 875 MG tablet Take 1 tablet (875 mg total) by mouth 2 (two) times daily. 07/21/24   Christopher Savannah, PA-C  cetirizine  (ZYRTEC  ALLERGY) 10 MG tablet Take 1 tablet (10 mg total) by mouth daily. 07/21/24   Christopher Savannah, PA-C  ibuprofen  (ADVIL ) 800 MG tablet Take 1 tablet (800 mg total) by mouth 3 (three) times daily. 03/03/24   Christopher Savannah, PA-C  promethazine -dextromethorphan (PROMETHAZINE -DM) 6.25-15 MG/5ML syrup Take 5 mLs by mouth 3 (three) times daily as needed for cough. 07/21/24   Christopher Savannah, PA-C  pseudoephedrine  (SUDAFED) 30 MG tablet Take 1 tablet (30 mg total) by mouth every 8 (eight) hours as needed for congestion. 07/21/24   Christopher Savannah, PA-C  traMADol  (ULTRAM ) 50 MG tablet Take 1 tablet (50 mg total) by  mouth every 6 (six) hours as needed. 10/03/23   Teresa Almarie LABOR, PA-C  triamcinolone  cream (KENALOG ) 0.1 % Apply 1 Application topically 2 (two) times daily as needed (rash). 03/29/24   Loreda Myla SAUNDERS, NP    Allergies: Latex    Review of Systems  Gastrointestinal:  Positive for abdominal pain.    Updated Vital Signs BP 101/70 (BP Location: Right Arm)   Pulse (!) 110   Temp 98.4 F (36.9 C)   Resp 12   Ht 5' 4 (1.626 m)   LMP 07/21/2024   SpO2 100%   BMI 27.46 kg/m   Physical Exam Vitals and nursing note reviewed.  Constitutional:      Appearance: Normal appearance.  HENT:     Head: Normocephalic and atraumatic.     Mouth/Throat:     Mouth: Mucous membranes are moist.  Eyes:     General: No scleral icterus.       Right eye: No discharge.        Left eye: No discharge.     Conjunctiva/sclera: Conjunctivae normal.  Cardiovascular:     Rate and Rhythm: Normal rate and regular rhythm.     Pulses: Normal pulses.  Pulmonary:     Effort: Pulmonary effort is normal.     Breath sounds: Normal breath sounds.  Abdominal:     General: There is no distension.     Tenderness: There is generalized abdominal tenderness. There is right  CVA tenderness. There is no guarding.  Musculoskeletal:        General: No deformity.     Cervical back: Normal range of motion.  Skin:    General: Skin is warm and dry.     Capillary Refill: Capillary refill takes less than 2 seconds.  Neurological:     Mental Status: She is alert.     Motor: No weakness.  Psychiatric:        Mood and Affect: Mood normal.     (all labs ordered are listed, but only abnormal results are displayed) Labs Reviewed  COMPREHENSIVE METABOLIC PANEL WITH GFR - Abnormal; Notable for the following components:      Result Value   Glucose, Bld 167 (*)    AST 13 (*)    All other components within normal limits  CBC WITH DIFFERENTIAL/PLATELET - Abnormal; Notable for the following components:   Hemoglobin 11.1 (*)     HCT 34.9 (*)    Platelets 444 (*)    All other components within normal limits  URINALYSIS, W/ REFLEX TO CULTURE (INFECTION SUSPECTED) - Abnormal; Notable for the following components:   APPearance CLOUDY (*)    Hgb urine dipstick SMALL (*)    Bilirubin Urine SMALL (*)    Ketones, ur 15 (*)    Protein, ur 100 (*)    Bacteria, UA MANY (*)    All other components within normal limits  LACTIC ACID, PLASMA  PREGNANCY, URINE    EKG: None  Radiology: CT ABDOMEN PELVIS W CONTRAST Result Date: 07/30/2024 EXAM: CT ABDOMEN AND PELVIS WITH CONTRAST 07/30/2024 04:29:26 PM TECHNIQUE: CT of the abdomen and pelvis was performed with the administration of intravenous contrast. 80 mL of iohexol  (OMNIPAQUE ) 300 MG/ML solution was administered. Multiplanar reformatted images are provided for review. Automated exposure control, iterative reconstruction, and/or weight-based adjustment of the mA/kV was utilized to reduce the radiation dose to as low as reasonably achievable. COMPARISON: Comparison with 11/08/2022. CLINICAL HISTORY: Abdominal pain, acute, nonlocalized. FINDINGS: LOWER CHEST: No acute abnormality. LIVER: The liver is unremarkable. GALLBLADDER AND BILE DUCTS: Gallbladder is unremarkable. No biliary ductal dilatation. SPLEEN: No acute abnormality. PANCREAS: No acute abnormality. ADRENAL GLANDS: No acute abnormality. KIDNEYS, URETERS AND BLADDER: No stones in the kidneys or ureters. No hydronephrosis. No perinephric or periureteral stranding. Urinary bladder is unremarkable. GI AND BOWEL: Stomach demonstrates no acute abnormality. There is no bowel obstruction. APPENDIX: Normal appendix. PERITONEUM AND RETROPERITONEUM: No ascites. No free air. VASCULATURE: Aorta is normal in caliber. LYMPH NODES: No lymphadenopathy. REPRODUCTIVE ORGANS: Uterine fibroids. BONES AND SOFT TISSUES: No acute osseous abnormality. No focal soft tissue abnormality. IMPRESSION: 1. No acute findings in the abdomen or pelvis. 2.  Fibroid uterus. Electronically signed by: Norman Gatlin MD 07/30/2024 04:55 PM EST RP Workstation: [id number]   DG Chest 2 View Result Date: 07/30/2024 CLINICAL DATA:  Cough and malaise for 2 weeks. Decreased appetite. Back and abdominal pain for 1 week. EXAM: CHEST - 2 VIEW COMPARISON:  01/14/2020 FINDINGS: Cardiomediastinal silhouette and pulmonary vasculature are within normal limits. Lungs are clear. IMPRESSION: No acute cardiopulmonary process. Electronically Signed   By: Aliene Lloyd M.D.   On: 07/30/2024 15:45    Procedures   Medications Ordered in the ED  lactated ringers  bolus 1,000 mL (0 mLs Intravenous Stopped 07/30/24 1755)  ketorolac  (TORADOL ) 15 MG/ML injection 15 mg (15 mg Intravenous Given 07/30/24 1520)  prochlorperazine  (COMPAZINE ) injection 10 mg (10 mg Intravenous Given 07/30/24 1520)  diphenhydrAMINE  (BENADRYL ) injection 25 mg (  25 mg Intravenous Given 07/30/24 1520)  iohexol  (OMNIPAQUE ) 300 MG/ML solution 80 mL (80 mLs Intravenous Contrast Given 07/30/24 1621)                                 Medical Decision Making Amount and/or Complexity of Data Reviewed Labs: ordered. Radiology: ordered.  Risk Prescription drug management.   This patient presents to the ED for concern of abdominal pain, headache, fatigue, this involves an extensive number of treatment options, and is a complaint that carries with it a high risk of complications and morbidity.  Differential diagnosis includes: Viral illness, flu, COVID, RSV, gastroenteritis, cholecystitis, pancreatitis, diverticulitis, sepsis, migraine, tension headache, dehydration, electrolyte abnormality, pregnancy  Co morbidities:  see above   Lab Tests:  I Ordered, and personally interpreted labs.  The pertinent results include: No acute abnormalities at explain patient's symptoms  Imaging Studies:  I ordered imaging studies including chest x-ray, CT abdomen pelvis I independently visualized and interpreted imaging  which showed no acute findings I agree with the radiologist interpretation  Cardiac Monitoring/ECG:  The patient was maintained on a cardiac monitor.  I personally viewed and interpreted the cardiac monitored which showed an underlying rhythm of: Sinus tachycardia  Medicines ordered and prescription drug management:  I ordered medication including  Medications  lactated ringers  bolus 1,000 mL (0 mLs Intravenous Stopped 07/30/24 1755)  ketorolac  (TORADOL ) 15 MG/ML injection 15 mg (15 mg Intravenous Given 07/30/24 1520)  prochlorperazine  (COMPAZINE ) injection 10 mg (10 mg Intravenous Given 07/30/24 1520)  diphenhydrAMINE  (BENADRYL ) injection 25 mg (25 mg Intravenous Given 07/30/24 1520)  iohexol  (OMNIPAQUE ) 300 MG/ML solution 80 mL (80 mLs Intravenous Contrast Given 07/30/24 1621)   for dehydration and headache Reevaluation of the patient after these medicines showed that the patient improved I have reviewed the patients home medicines and have made adjustments as needed  Test Considered:   none  Critical Interventions:   none  Consultations Obtained: None  Problem List / ED Course:     ICD-10-CM   1. Other migraine without status migrainosus, not intractable  G43.809     2. Generalized abdominal pain  R10.84       MDM: 39 year old female who presents emergency department for evaluation multiple complaints.  Her headache, fatigue and abdominal pain has been present for the last 6 days, so acute life-threatening cause is low on the differential.  Lab work is unremarkable.  No evidence of leukocytosis, to suggest infection.  UA is nitrite and leukocyte negative.  Patient's chest x-ray shows no acute findings.  Patient CT has no acute findings.  She did present with typical migraine symptoms, so I gave her a migraine cocktail.  I also ordered IV fluids as her initial blood pressure was 101/70.  On my reassessment, she did report that her headache had improved.  Ultimately, given the  lack of acute findings, there is no indication to keep this patient in the hospital.  It is possible she may have a viral illness in addition to her abdominal pain and migraine.  We did discuss this possibility.  I informed the patient that she may take Tylenol  and/or ibuprofen  every 6 hours as needed for symptoms.  I also encouraged the patient to stay hydrated and get plenty of rest.  She verbalized her understanding to this.  Patient's vitals are stable.  Patient is appropriate for discharge at this time.   Dispostion:  After consideration of  the diagnostic results and the patients response to treatment, I feel that the patient would benefit from supportive care.    Final diagnoses:  Other migraine without status migrainosus, not intractable  Generalized abdominal pain    ED Discharge Orders          Ordered    ondansetron  (ZOFRAN -ODT) 4 MG disintegrating tablet  Every 8 hours PRN        07/30/24 1806               Torrence Marry RAMAN, PA-C 07/30/24 1806    Ruthe Cornet, DO 07/30/24 1832  "
